# Patient Record
Sex: Male | Born: 1975
Health system: Southern US, Community
[De-identification: ages and names within clinical notes are randomized; demographics above are authoritative.]

## PROBLEM LIST (undated history)

## (undated) DIAGNOSIS — R519 Headache, unspecified: Secondary | ICD-10-CM

## (undated) DIAGNOSIS — R51 Headache: Secondary | ICD-10-CM

## (undated) DIAGNOSIS — C20 Malignant neoplasm of rectum: Secondary | ICD-10-CM

## (undated) HISTORY — PX: PORTA CATH INSERTION: CATH118285

## (undated) HISTORY — PX: WISDOM TOOTH EXTRACTION: SHX21

## (undated) HISTORY — PX: COLONOSCOPY: SHX174

---

## 2016-04-18 DIAGNOSIS — C20 Malignant neoplasm of rectum: Secondary | ICD-10-CM

## 2016-04-18 HISTORY — DX: Malignant neoplasm of rectum: C20

## 2016-11-16 ENCOUNTER — Emergency Department (HOSPITAL_COMMUNITY): Payer: BLUE CROSS/BLUE SHIELD

## 2016-11-16 ENCOUNTER — Emergency Department (HOSPITAL_COMMUNITY)
Admission: EM | Admit: 2016-11-16 | Discharge: 2016-11-16 | Disposition: A | Discharge status: Home / Self Care | Payer: BLUE CROSS/BLUE SHIELD | Attending: Emergency Medicine | Admitting: Emergency Medicine

## 2016-11-16 ENCOUNTER — Encounter (HOSPITAL_COMMUNITY): Payer: Self-pay | Admitting: Emergency Medicine

## 2016-11-16 DIAGNOSIS — Z79899 Other long term (current) drug therapy: Secondary | ICD-10-CM | POA: Insufficient documentation

## 2016-11-16 DIAGNOSIS — R1030 Lower abdominal pain, unspecified: Secondary | ICD-10-CM

## 2016-11-16 DIAGNOSIS — Z87891 Personal history of nicotine dependence: Secondary | ICD-10-CM | POA: Insufficient documentation

## 2016-11-16 DIAGNOSIS — K5902 Outlet dysfunction constipation: Secondary | ICD-10-CM | POA: Insufficient documentation

## 2016-11-16 LAB — CBC WITH DIFFERENTIAL/PLATELET
BASOS PCT: 1 %
Basophils Absolute: 0.1 10*3/uL (ref 0.0–0.1)
EOS ABS: 0.4 10*3/uL (ref 0.0–0.7)
EOS PCT: 5 %
HCT: 37 % — ABNORMAL LOW (ref 39.0–52.0)
Hemoglobin: 12.6 g/dL — ABNORMAL LOW (ref 13.0–17.0)
LYMPHS ABS: 2.6 10*3/uL (ref 0.7–4.0)
Lymphocytes Relative: 29 %
MCH: 30.1 pg (ref 26.0–34.0)
MCHC: 34.1 g/dL (ref 30.0–36.0)
MCV: 88.3 fL (ref 78.0–100.0)
Monocytes Absolute: 0.6 10*3/uL (ref 0.1–1.0)
Monocytes Relative: 7 %
Neutro Abs: 5.4 10*3/uL (ref 1.7–7.7)
Neutrophils Relative %: 58 %
PLATELETS: 268 10*3/uL (ref 150–400)
RBC: 4.19 MIL/uL — AB (ref 4.22–5.81)
RDW: 13.8 % (ref 11.5–15.5)
WBC: 9.1 10*3/uL (ref 4.0–10.5)

## 2016-11-16 LAB — COMPREHENSIVE METABOLIC PANEL
ALT: 12 U/L — AB (ref 17–63)
ANION GAP: 6 (ref 5–15)
AST: 17 U/L (ref 15–41)
Albumin: 3.7 g/dL (ref 3.5–5.0)
Alkaline Phosphatase: 57 U/L (ref 38–126)
BUN: 11 mg/dL (ref 6–20)
CHLORIDE: 106 mmol/L (ref 101–111)
CO2: 29 mmol/L (ref 22–32)
Calcium: 8.7 mg/dL — ABNORMAL LOW (ref 8.9–10.3)
Creatinine, Ser: 0.82 mg/dL (ref 0.61–1.24)
GFR calc non Af Amer: >60 mL/min (ref >60)
Glucose, Bld: 96 mg/dL (ref 65–99)
Potassium: 3.9 mmol/L (ref 3.5–5.1)
SODIUM: 141 mmol/L (ref 135–145)
Total Bilirubin: 0.4 mg/dL (ref 0.3–1.2)
Total Protein: 6.3 g/dL — ABNORMAL LOW (ref 6.5–8.1)

## 2016-11-16 LAB — URINALYSIS, ROUTINE W REFLEX MICROSCOPIC
Bilirubin Urine: NEGATIVE
Glucose, UA: NEGATIVE mg/dL
Hgb urine dipstick: NEGATIVE
Ketones, ur: NEGATIVE mg/dL
Leukocytes, UA: NEGATIVE
NITRITE: NEGATIVE
Protein, ur: NEGATIVE mg/dL
Specific Gravity, Urine: 1.02 (ref 1.005–1.030)
pH: 5 (ref 5.0–8.0)

## 2016-11-16 LAB — LIPASE, BLOOD: Lipase: 25 U/L (ref 11–51)

## 2016-11-16 MED ORDER — IOPAMIDOL (ISOVUE-300) INJECTION 61%
INTRAVENOUS | Status: AC
  Filled 2016-11-16: qty 100

## 2016-11-16 MED ORDER — MORPHINE SULFATE (PF) 2 MG/ML IV SOLN
4.0000 mg | Freq: Once | INTRAVENOUS | Status: AC
  Administered 2016-11-16: 4 mg via INTRAVENOUS
  Filled 2016-11-16: qty 2

## 2016-11-16 MED ORDER — IOPAMIDOL (ISOVUE-300) INJECTION 61%
100.0000 mL | Freq: Once | INTRAVENOUS | Status: AC | PRN
  Administered 2016-11-16: 100 mL via INTRAVENOUS

## 2016-11-16 MED ORDER — ONDANSETRON HCL 4 MG/2ML IJ SOLN
4.0000 mg | Freq: Once | INTRAMUSCULAR | Status: AC
  Administered 2016-11-16: 4 mg via INTRAVENOUS
  Filled 2016-11-16: qty 2

## 2016-11-16 MED ORDER — SODIUM CHLORIDE 0.9 % IV BOLUS (SEPSIS)
1000.0000 mL | Freq: Once | INTRAVENOUS | Status: AC
  Administered 2016-11-16: 1000 mL via INTRAVENOUS

## 2016-11-16 NOTE — Discharge Instructions (Signed)
TAKE 8 CAPFULS OF MIRALAX IN A 32 OUNCE GATORADE AND DRINK THE WHOLE BEVERAGE.  Call for appointments for GI, Oncology, and a PCP

## 2016-11-16 NOTE — ED Provider Notes (Signed)
[facility] DEPT Provider Note   CSN: [id number] Arrival date & time: 11/16/16  0520     History   Chief Complaint Chief Complaint  Patient presents with  . Abdominal Pain    HPI Josie Mesa. is a 41 y.o. male.  41 yo M with a cc of crampy lower abdominal pain.  Going on for past week or so.  Denies fevers, vomiting.  Hx of colon CA.  Diagnosed a couple weeks ago.  Was in [place], one round of chemo. Had to move here because of an emergency.  Now looking for oncology and GI.     The history is provided by the patient.  Abdominal Pain   This is a new problem. The current episode started more than 2 days ago. The problem occurs constantly. The problem has been gradually worsening. The pain is associated with eating. The pain is located in the RLQ, LLQ and suprapubic region. The quality of the pain is aching and cramping. The pain is at a severity of 7/10. The pain is severe. Associated symptoms include constipation. Pertinent negatives include fever, diarrhea, vomiting, headaches, arthralgias and myalgias. Nothing aggravates the symptoms. Nothing relieves the symptoms.    Past Medical History:  Diagnosis Date  . Colorectal cancer ([place]) dx'd 09/13/16    There are no active problems to display for this patient.   Past Surgical History:  Procedure Laterality Date  . COLONOSCOPY         Home Medications    Prior to Admission medications   Medication Sig Start Date End Date Taking? Authorizing Provider  acetaminophen (TYLENOL) 500 MG tablet Take 1,000 mg by mouth every 6 (six) hours as needed for moderate pain.   Yes [provider]  acidophilus (RISAQUAD) CAPS capsule Take 1 capsule by mouth daily.   Yes [provider]  Ascorbic Acid (VITAMIN C) 1000 MG tablet Take 1,000 mg by mouth daily.   Yes [provider]  docusate sodium (COLACE) 100 MG capsule Take 100 mg by mouth daily as needed for mild constipation.   Yes [provider]    GARLIC PO Take 1 tablet by mouth daily.   Yes [provider]  Pramox-PE-Glycerin-Petrolatum (PREPARATION H) 1-0.25-14.4-15 % CREA Place 1 application rectally 4 (four) times daily as needed. Hemorrhoids   Yes [provider]  TURMERIC PO Take 1,000 tablets by mouth daily.   Yes [provider]    Family History Family History  Problem Relation Age of Onset  . Cancer Other   . Diabetes Other     Social History Social History  Substance Use Topics  . Smoking status: Former Research scientist (life sciences)  . Smokeless tobacco: Never Used  . Alcohol use No     Allergies   Patient has no known allergies.   Review of Systems Review of Systems  Constitutional: Negative for chills and fever.  HENT: Negative for congestion and facial swelling.   Eyes: Negative for discharge and visual disturbance.  Respiratory: Negative for shortness of breath.   Cardiovascular: Negative for chest pain and palpitations.  Gastrointestinal: Positive for abdominal pain and constipation. Negative for diarrhea and vomiting.  Musculoskeletal: Negative for arthralgias and myalgias.  Skin: Negative for color change and rash.  Neurological: Negative for tremors, syncope and headaches.  Psychiatric/Behavioral: Negative for confusion and dysphoric mood.     Physical Exam Updated Vital Signs BP 94/66 (BP Location: Left Arm)   Pulse (!) 51   Temp 97.8 F (36.6 C) (Oral)  Resp 14   Ht 6' 2.5" (1.892 m)   Wt 68.5 kg (151 lb)   SpO2 100%   BMI 19.13 kg/m   Physical Exam  Constitutional: He is oriented to person, place, and time. He appears well-developed and well-nourished.  HENT:  Head: Normocephalic and atraumatic.  Eyes: Pupils are equal, round, and reactive to light. EOM are normal.  Neck: Normal range of motion. Neck supple. No JVD present.  Cardiovascular: Normal rate and regular rhythm.  Exam reveals no gallop and no friction rub.   No murmur heard. Pulmonary/Chest: No respiratory  distress. He has no wheezes.  Abdominal: He exhibits no distension and no mass. There is tenderness (TTP mild about the lower abdomen). There is no rebound and no guarding. No hernia. Hernia confirmed negative in the right inguinal area and confirmed negative in the left inguinal area.  Genitourinary: Right testis shows no mass, no swelling and no tenderness. Left testis shows no mass, no swelling and no tenderness. Circumcised.  Musculoskeletal: Normal range of motion.  Neurological: He is alert and oriented to person, place, and time.  Skin: No rash noted. No pallor.  Psychiatric: He has a normal mood and affect. His behavior is normal.  Nursing note and vitals reviewed.    ED Treatments / Results  Labs (all labs ordered are listed, but only abnormal results are displayed) Labs Reviewed  CBC WITH DIFFERENTIAL/PLATELET - Abnormal; Notable for the following:       Result Value   RBC 4.19 (*)    Hemoglobin 12.6 (*)    HCT 37.0 (*)    All other components within normal limits  COMPREHENSIVE METABOLIC PANEL - Abnormal; Notable for the following:    Calcium 8.7 (*)    Total Protein 6.3 (*)    ALT 12 (*)    All other components within normal limits  LIPASE, BLOOD  URINALYSIS, ROUTINE W REFLEX MICROSCOPIC    EKG  EKG Interpretation None       Radiology Ct Abdomen Pelvis W Contrast  Result Date: 11/16/2016 CLINICAL DATA:  Lower abdominal pain.  History of colon cancer. EXAM: CT ABDOMEN AND PELVIS WITH CONTRAST TECHNIQUE: Multidetector CT imaging of the abdomen and pelvis was performed using the standard protocol following bolus administration of intravenous contrast. CONTRAST:  124mL ISOVUE-300 IOPAMIDOL (ISOVUE-300) INJECTION 61% COMPARISON:  None. FINDINGS: Lower chest: No acute abnormality. Hepatobiliary: Gallstones are noted. Peripherally enhancing 14 mm abnormality is noted in left hepatic lobe. Smaller low density abnormality is seen in dome of right hepatic lobe. These may  represent hemangiomas, but metastatic disease cannot be excluded given the history of colon cancer. Pancreas: Unremarkable. No pancreatic ductal dilatation or surrounding inflammatory changes. Spleen: Normal in size without focal abnormality. Adrenals/Urinary Tract: Adrenal glands are unremarkable. Kidneys are normal, without renal calculi, focal lesion, or hydronephrosis. Bladder is unremarkable. Stomach/Bowel: The stomach appears normal. The appendix is not clearly identified. No small bowel dilatation is noted. Air-filled colon is noted. Apple-core lesion is seen involving the rectosigmoid junction, with adjacent soft tissue mass measuring 4.9 x 3.5 x 3.3 cm consistent with malignancy. Vascular/Lymphatic: No significant vascular findings are present. No enlarged abdominal or pelvic lymph nodes. Reproductive: Prostate is unremarkable. Other: No abdominal wall hernia or abnormality. No abdominopelvic ascites. Musculoskeletal: No acute or significant osseous findings. IMPRESSION: Apple-core lesion with adjacent soft tissue mass is noted involving the rectosigmoid junction consistent with a history of colonic malignancy. Small peripherally enhancing low densities are noted in the liver which may  simply represent hemangiomas, but given the history of colonic malignancy, MRI of the liver is recommended to evaluate for possible metastatic disease. Electronically Signed   By: Marijo Conception, M.D.   On: 11/16/2016 09:33    Procedures Procedures (including critical care time)  Medications Ordered in ED Medications  morphine 2 MG/ML injection 4 mg (4 mg Intravenous Given 11/16/16 0802)  ondansetron (ZOFRAN) injection 4 mg (4 mg Intravenous Given 11/16/16 0802)  sodium chloride 0.9 % bolus 1,000 mL (0 mLs Intravenous Stopped 11/16/16 0901)  iopamidol (ISOVUE-300) 61 % injection 100 mL (100 mLs Intravenous Contrast Given 11/16/16 0904)     Initial Impression / Assessment and Plan / ED Course  I have reviewed the  triage vital signs and the nursing notes.  Pertinent labs & imaging results that were available during my care of the patient were reviewed by me and considered in my medical decision making (see chart for details).     41 yo M with a cc of lower abdominal pain.  Going on for past week.  Worsening constipation.  He feels that his mass has enlarged. Will obtain a CT.   No noted obstruction, will treat for constipation. Given follow up with GI, PCP, oncology.   9:45 AM:  I have discussed the diagnosis/risks/treatment options with the patient and believe the pt to be eligible for discharge home to follow-up with PCP, oncology, GI. We also discussed returning to the ED immediately if new or worsening sx occur. We discussed the sx which are most concerning (e.g., sudden worsening pain, fever, inability to tolerate by mouth) that necessitate immediate return. Medications administered to the patient during their visit and any new prescriptions provided to the patient are listed below.  Medications given during this visit Medications  morphine 2 MG/ML injection 4 mg (4 mg Intravenous Given 11/16/16 0802)  ondansetron (ZOFRAN) injection 4 mg (4 mg Intravenous Given 11/16/16 0802)  sodium chloride 0.9 % bolus 1,000 mL (0 mLs Intravenous Stopped 11/16/16 0901)  iopamidol (ISOVUE-300) 61 % injection 100 mL (100 mLs Intravenous Contrast Given 11/16/16 0904)     The patient appears reasonably screen and/or stabilized for discharge and I doubt any other medical condition or other [facility] requiring further screening, evaluation, or treatment in the ED at this time prior to discharge.    Final Clinical Impressions(s) / ED Diagnoses   Final diagnoses:  Constipation due to outlet dysfunction  Lower abdominal pain    New Prescriptions New Prescriptions   No medications on file     Deno Etienne, DO 11/16/16 0945

## 2016-11-16 NOTE — ED Triage Notes (Signed)
Pt states he is from [place] and had to emergently move here so he is in the process of trying to get established with an oncologist  Pt states he was diagnosed with colorectal cancer and was undergoing chemo in [place] but has not had any in the past 2 weeks  Pt states he is having lower abd pain that he is not able to control at home with hot baths and weed  Pt has his records with him at bedside   Pt states he has been having rectal bleeding as well

## 2016-12-07 DIAGNOSIS — G893 Neoplasm related pain (acute) (chronic): Secondary | ICD-10-CM | POA: Insufficient documentation

## 2016-12-07 DIAGNOSIS — C20 Malignant neoplasm of rectum: Secondary | ICD-10-CM | POA: Insufficient documentation

## 2017-03-04 ENCOUNTER — Other Ambulatory Visit: Payer: Self-pay

## 2017-03-04 ENCOUNTER — Encounter (HOSPITAL_BASED_OUTPATIENT_CLINIC_OR_DEPARTMENT_OTHER): Payer: Self-pay | Admitting: Emergency Medicine

## 2017-03-04 ENCOUNTER — Emergency Department (HOSPITAL_BASED_OUTPATIENT_CLINIC_OR_DEPARTMENT_OTHER)
Admission: EM | Admit: 2017-03-04 | Discharge: 2017-03-04 | Disposition: A | Discharge status: Home / Self Care | Payer: BLUE CROSS/BLUE SHIELD | Attending: Emergency Medicine | Admitting: Emergency Medicine

## 2017-03-04 ENCOUNTER — Emergency Department (HOSPITAL_BASED_OUTPATIENT_CLINIC_OR_DEPARTMENT_OTHER): Payer: BLUE CROSS/BLUE SHIELD

## 2017-03-04 DIAGNOSIS — Z79899 Other long term (current) drug therapy: Secondary | ICD-10-CM | POA: Diagnosis not present

## 2017-03-04 DIAGNOSIS — Z85038 Personal history of other malignant neoplasm of large intestine: Secondary | ICD-10-CM | POA: Diagnosis not present

## 2017-03-04 DIAGNOSIS — C189 Malignant neoplasm of colon, unspecified: Secondary | ICD-10-CM

## 2017-03-04 DIAGNOSIS — K5900 Constipation, unspecified: Secondary | ICD-10-CM | POA: Insufficient documentation

## 2017-03-04 DIAGNOSIS — F17210 Nicotine dependence, cigarettes, uncomplicated: Secondary | ICD-10-CM | POA: Diagnosis not present

## 2017-03-04 DIAGNOSIS — R195 Other fecal abnormalities: Secondary | ICD-10-CM | POA: Diagnosis not present

## 2017-03-04 LAB — COMPREHENSIVE METABOLIC PANEL
ALBUMIN: 3.9 g/dL (ref 3.5–5.0)
ALK PHOS: 64 U/L (ref 38–126)
ALT: 18 U/L (ref 17–63)
AST: 22 U/L (ref 15–41)
Anion gap: 6 (ref 5–15)
BILIRUBIN TOTAL: 0.5 mg/dL (ref 0.3–1.2)
BUN: 7 mg/dL (ref 6–20)
CALCIUM: 8.7 mg/dL — AB (ref 8.9–10.3)
CO2: 28 mmol/L (ref 22–32)
Chloride: 105 mmol/L (ref 101–111)
Creatinine, Ser: 0.56 mg/dL — ABNORMAL LOW (ref 0.61–1.24)
GFR calc Af Amer: >60 mL/min (ref >60)
GFR calc non Af Amer: >60 mL/min (ref >60)
GLUCOSE: 109 mg/dL — AB (ref 65–99)
Potassium: 3.9 mmol/L (ref 3.5–5.1)
SODIUM: 139 mmol/L (ref 135–145)
TOTAL PROTEIN: 6.6 g/dL (ref 6.5–8.1)

## 2017-03-04 LAB — CBC WITH DIFFERENTIAL/PLATELET
BASOS ABS: 0.1 10*3/uL (ref 0.0–0.1)
BASOS PCT: 1 %
Eosinophils Absolute: 0.6 10*3/uL (ref 0.0–0.7)
Eosinophils Relative: 7 %
HEMATOCRIT: 37.8 % — AB (ref 39.0–52.0)
HEMOGLOBIN: 12.7 g/dL — AB (ref 13.0–17.0)
Lymphocytes Relative: 28 %
Lymphs Abs: 2.5 10*3/uL (ref 0.7–4.0)
MCH: 30.4 pg (ref 26.0–34.0)
MCHC: 33.6 g/dL (ref 30.0–36.0)
MCV: 90.4 fL (ref 78.0–100.0)
Monocytes Absolute: 0.9 10*3/uL (ref 0.1–1.0)
Monocytes Relative: 11 %
NEUTROS ABS: 4.7 10*3/uL (ref 1.7–7.7)
NEUTROS PCT: 53 %
Platelets: 246 10*3/uL (ref 150–400)
RBC: 4.18 MIL/uL — AB (ref 4.22–5.81)
RDW: 14.1 % (ref 11.5–15.5)
WBC: 8.9 10*3/uL (ref 4.0–10.5)

## 2017-03-04 LAB — OCCULT BLOOD X 1 CARD TO LAB, STOOL: Fecal Occult Bld: POSITIVE — AB

## 2017-03-04 MED ORDER — TRAMADOL HCL 50 MG PO TABS: 100.0000 mg | ORAL_TABLET | Freq: Four times a day (QID) | ORAL | 0 refills | Status: DC | PRN

## 2017-03-04 MED ORDER — PEG 3350-KCL-NABCB-NACL-NASULF 236 G PO SOLR: ORAL | 2 refills | Status: DC

## 2017-03-04 NOTE — ED Notes (Signed)
Patient stated that he was diagnosed with colon cancer on Sep 13, 2016.  He has been constipated and the only med that will help him is the Golytely.  Patient stated that his MD gave him 110 tabs of Percocet and his 39 year old stepdaughter throw the med away by mistake.

## 2017-03-04 NOTE — ED Triage Notes (Signed)
Pt with colorectal CA and c/o constipation x 3 wks; was receiving tx in [facility], but recently moved here and is in the process of establishing care

## 2017-03-04 NOTE — ED Provider Notes (Signed)
[facility] EMERGENCY DEPARTMENT Provider Note   CSN: [id number] Arrival date & time: 03/04/17  1745     History   Chief Complaint Chief Complaint  Patient presents with  . Constipation    HPI Jacob Galvan. is a 41 y.o. male.  HPI Patient states he has colorectal cancer.  He has undergone chemotherapy but never had surgical interventions.  He reports that this was treated in [place] but he has recently moving to this area.  He has not establish care.  Patient reports he suffers from problems with severe constipation.  He reports he has had constipation for 3 weeks now.  He gets intense cramping pain and urgency for bowel movement but unable to go.  He reports the only thing that actually works for him is GoLYTELY.  He takes MiraLAX regularly but cannot maintain bowel movements.  No fever no vomiting.  Reports he is not getting any narcotic pain medications.  He reports he has not had any in months.  He reports that at one point in time his medications got erroneously thrown away and he could no longer get refills due to being in a pain contract. Past Medical History:  Diagnosis Date  . Colorectal cancer ([place]) dx'd 09/13/16    There are no active problems to display for this patient.   Past Surgical History:  Procedure Laterality Date  . COLONOSCOPY    . PORTA CATH INSERTION         Home Medications    Prior to Admission medications   Medication Sig Start Date End Date Taking? Authorizing Provider  acetaminophen (TYLENOL) 500 MG tablet Take 1,000 mg by mouth every 6 (six) hours as needed for moderate pain.    [provider]  acidophilus (RISAQUAD) CAPS capsule Take 1 capsule by mouth daily.    [provider]  Ascorbic Acid (VITAMIN C) 1000 MG tablet Take 1,000 mg by mouth daily.    [provider]  docusate sodium (COLACE) 100 MG capsule Take 100 mg by mouth daily as needed for mild constipation.    [provider]  GARLIC  PO Take 1 tablet by mouth daily.    [provider]  polyethylene glycol (GOLYTELY) 236 g solution 2 L by mouth over 4 hours 03/04/17   Charlesetta Shanks, MD  Pramox-PE-Glycerin-Petrolatum (PREPARATION H) 1-0.25-14.4-15 % CREA Place 1 application rectally 4 (four) times daily as needed. Hemorrhoids    [provider]  traMADol (ULTRAM) 50 MG tablet Take 2 tablets (100 mg total) every 6 (six) hours as needed by mouth. 03/04/17   Charlesetta Shanks, MD  TURMERIC PO Take 1,000 tablets by mouth daily.    [provider]    Family History Family History  Problem Relation Age of Onset  . Cancer Other   . Diabetes Other     Social History Social History   Tobacco Use  . Smoking status: Current Some Day Smoker  . Smokeless tobacco: Never Used  Substance Use Topics  . Alcohol use: Yes    Comment: occ  . Drug use: Yes    Types: Marijuana    Comment: daily     Allergies   Patient has no known allergies.   Review of Systems Review of Systems 10 Systems reviewed and are negative for acute change except as noted in the HPI.  Physical Exam Updated Vital Signs BP 109/79 (BP Location: Right Arm)   Pulse 84   Temp 98.1 F (36.7 C) (Oral)  Resp 18   Ht 6\' 2"  (1.88 m)   Wt 63.5 kg (140 lb)   SpO2 100%   BMI 17.97 kg/m   Physical Exam  Constitutional: He is oriented to person, place, and time. He appears well-developed and well-nourished.  Patient is alert and nontoxic.  Mental status clear.  No respiratory distress.  HENT:  Head: Normocephalic and atraumatic.  Eyes: Conjunctivae and EOM are normal.  Neck: Neck supple.  Cardiovascular: Normal rate and regular rhythm.  No murmur heard. Pulmonary/Chest: Effort normal and breath sounds normal. No respiratory distress.  Abdominal: Soft. He exhibits no distension. There is no tenderness.  Genitourinary:  Genitourinary Comments: No stool in rectal vault.  Musculoskeletal: Normal range of motion. He exhibits  no edema or tenderness.  Neurological: He is alert and oriented to person, place, and time. No cranial nerve deficit. He exhibits normal muscle tone. Coordination normal.  Skin: Skin is warm and dry.  Psychiatric: He has a normal mood and affect.  Nursing note and vitals reviewed.    ED Treatments / Results  Labs (all labs ordered are listed, but only abnormal results are displayed) Labs Reviewed  CBC WITH DIFFERENTIAL/PLATELET - Abnormal; Notable for the following components:      Result Value   RBC 4.18 (*)    Hemoglobin 12.7 (*)    HCT 37.8 (*)    All other components within normal limits  COMPREHENSIVE METABOLIC PANEL - Abnormal; Notable for the following components:   Glucose, Bld 109 (*)    Creatinine, Ser 0.56 (*)    Calcium 8.7 (*)    All other components within normal limits  OCCULT BLOOD X 1 CARD TO LAB, STOOL - Abnormal; Notable for the following components:   Fecal Occult Bld POSITIVE (*)    All other components within normal limits    EKG  EKG Interpretation None       Radiology Dg Abd Acute W/chest  Result Date: 03/04/2017 CLINICAL DATA:  Colorectal cancer diagnosed April 2018. Last chemotherapy treatment October 2018. Constant pain. Feels constipated. EXAM: DG ABDOMEN ACUTE W/ 1V CHEST COMPARISON:  CT abdomen and pelvis from 11/16/2016. FINDINGS: Normal heart size and mediastinal contours. Port catheter tip is seen at the cavoatrial junction. Lungs are clear. There is no free air beneath the diaphragm. Increased colonic stool burden is seen throughout the colon. No small bowel dilatation. No organomegaly or suspicious osseous lesions. IMPRESSION: Increased colonic stool burden consistent with constipation. Electronically Signed   By: Ashley Royalty M.D.   On: 03/04/2017 22:15    Procedures Procedures (including critical care time)  Medications Ordered in ED Medications - No data to display   Initial Impression / Assessment and Plan / ED Course  I have  reviewed the triage vital signs and the nursing notes.  Pertinent labs & imaging results that were available during my care of the patient were reviewed by me and considered in my medical decision making (see chart for details).      Final Clinical Impressions(s) / ED Diagnoses   Final diagnoses:  Constipation, unspecified constipation type  Malignant neoplasm of colon, unspecified part of colon [facility])  Patient presents as outlined above.  He reports history of colon cancer.  Patient clinically is stable.  He does have a copy of his CT report from the end of October.  Acute abdominal series shows no evidence of obstruction but diffuse constipation.  Digital exam does not show impaction.  Patient reports that the only thing that  really works for him is GoLYTELY.  This will be prescribed as per the patient's request.  She reports that he has not had narcotic pain medications in many months.  Will be given tramadol to use sparingly for abdominal pain.  Follow-up providers are given and patient reports he will be establishing care locally.  ED Discharge Orders        Ordered    traMADol (ULTRAM) 50 MG tablet  Every 6 hours PRN     03/04/17 2306    polyethylene glycol (GOLYTELY) 236 g solution     03/04/17 2306       Charlesetta Shanks, MD 03/04/17 2315

## 2017-03-06 ENCOUNTER — Telehealth: Payer: Self-pay | Admitting: Oncology

## 2017-03-06 DIAGNOSIS — C189 Malignant neoplasm of colon, unspecified: Secondary | ICD-10-CM

## 2017-03-06 NOTE — Progress Notes (Signed)
  Oncology Nurse Navigator Documentation  Navigator Location: [facility] (03/06/17 1154)   )Navigator Encounter Type: Telephone (03/06/17 1154) Telephone: Jacob Galvan Call (03/06/17 1154)  I called patient and discussed transition of his Oncology Care from [facility] to [facility]. Patient states that he is in the middle of his "cancer treatment and chemo" and had to move to [place] because of a family emergency. I called and left a message for patient's oncologist, Dr. Ethlyn Daniels, requesting a call back.                   Treatment Phase: Pre-Tx/Tx Discussion (03/06/17 1154)                  Acuity: Level 3 (03/06/17 1154)     Acuity Level 3: Ongoing guidance and education provided throughout treatment;Other (03/06/17 1154)   Time Spent with Patient: 60 (03/06/17 1154)

## 2017-03-06 NOTE — Telephone Encounter (Signed)
Appt has been scheduled for the pt to see Dr. Benay Spice on 11/20 at 10am. Pt aware to arrive 30 minutes early.

## 2017-03-06 NOTE — Progress Notes (Signed)
  Oncology Nurse Navigator Documentation  Navigator Location: [facility] (03/06/17 2952) Referral date to RadOnc/MedOnc: 03/06/17 (03/06/17 8413) )Navigator Encounter Type: Introductory phone call (03/06/17 0934)                         Barriers/Navigation Needs: Coordination of Care (03/06/17 2440)   Interventions: Coordination of Care;Referrals (03/06/17 0934)   Coordination of Care: Appts (03/06/17 1027)   I received a message from Dr. Jana Hakim regarding this patient. Dr. Jana Hakim saw this patient in the ED over the weekend with diagnosis of constipation. Dr, Jana Hakim shared that patient recently moved to the area and had been treated for colon cancer in the past and would like to establish care at [facility]. I called and spoke with Jacob Galvan and he would like to be scheduled for a Med/Onc appointment. Referral placed.      Acuity: Level 1 (03/06/17 0934)         Time Spent with Patient: 15 (03/06/17 0934)

## 2017-03-06 NOTE — Progress Notes (Signed)
  Oncology Nurse Navigator Documentation  I spoke with Dr. Ilsa Iha Abushahin's nurse at [facility]. She will fax latest MD notes. I was able to query patient information on Care Everywhere. Patient to be seen on 03/07/17 by Dr. Benay Spice. I called patient and provided him with directions to [facility]. Patient appreciative.      )

## 2017-03-07 ENCOUNTER — Telehealth: Payer: Self-pay | Admitting: *Deleted

## 2017-03-07 ENCOUNTER — Ambulatory Visit: Payer: BLUE CROSS/BLUE SHIELD | Admitting: Oncology

## 2017-03-07 NOTE — Telephone Encounter (Signed)
Called pt re: missed appt today. He reports he also had an app[facility] and didn't get done in time. Pt requested to reschedule. Left message on voicemail offering 11/29 @ 4PM or 11/30 @ 1330 to see Dr. Benay Spice. Requested pt call back to schedule.

## 2017-03-07 NOTE — Progress Notes (Signed)
  Oncology Nurse Navigator Documentation  Navigator Location: [facility] (03/07/17 1604)   )Navigator Encounter Type: Telephone (03/07/17 1604) Telephone: Incoming Call (03/07/17 1604)                               Coordination of Care: Appts (03/07/17 9604)     Patient called back to confirm appointment on 03/17/17 @ 1330.   Acuity: Level 3 (03/07/17 1604)     Acuity Level 3: Ongoing guidance and education provided throughout treatment (03/07/17 1604)   Time Spent with Patient: 15 (03/07/17 1604)

## 2017-03-12 ENCOUNTER — Other Ambulatory Visit: Payer: Self-pay

## 2017-03-12 ENCOUNTER — Encounter (HOSPITAL_COMMUNITY): Payer: Self-pay | Admitting: Emergency Medicine

## 2017-03-12 ENCOUNTER — Emergency Department (HOSPITAL_COMMUNITY): Payer: BLUE CROSS/BLUE SHIELD

## 2017-03-12 ENCOUNTER — Emergency Department (HOSPITAL_COMMUNITY)
Admission: EM | Admit: 2017-03-12 | Discharge: 2017-03-12 | Disposition: A | Discharge status: Home / Self Care | Payer: BLUE CROSS/BLUE SHIELD | Attending: Emergency Medicine | Admitting: Emergency Medicine

## 2017-03-12 DIAGNOSIS — K5900 Constipation, unspecified: Secondary | ICD-10-CM | POA: Diagnosis not present

## 2017-03-12 DIAGNOSIS — R109 Unspecified abdominal pain: Secondary | ICD-10-CM

## 2017-03-12 DIAGNOSIS — C189 Malignant neoplasm of colon, unspecified: Secondary | ICD-10-CM | POA: Diagnosis not present

## 2017-03-12 DIAGNOSIS — R1030 Lower abdominal pain, unspecified: Secondary | ICD-10-CM | POA: Diagnosis present

## 2017-03-12 DIAGNOSIS — Z79899 Other long term (current) drug therapy: Secondary | ICD-10-CM | POA: Diagnosis not present

## 2017-03-12 DIAGNOSIS — F17200 Nicotine dependence, unspecified, uncomplicated: Secondary | ICD-10-CM | POA: Diagnosis not present

## 2017-03-12 LAB — COMPREHENSIVE METABOLIC PANEL
ALT: 17 U/L (ref 17–63)
ANION GAP: 7 (ref 5–15)
AST: 27 U/L (ref 15–41)
Albumin: 3.8 g/dL (ref 3.5–5.0)
Alkaline Phosphatase: 64 U/L (ref 38–126)
BILIRUBIN TOTAL: 0.5 mg/dL (ref 0.3–1.2)
BUN: 9 mg/dL (ref 6–20)
CO2: 27 mmol/L (ref 22–32)
Calcium: 8.6 mg/dL — ABNORMAL LOW (ref 8.9–10.3)
Chloride: 102 mmol/L (ref 101–111)
Creatinine, Ser: 0.87 mg/dL (ref 0.61–1.24)
GFR calc non Af Amer: >60 mL/min (ref >60)
GLUCOSE: 115 mg/dL — AB (ref 65–99)
POTASSIUM: 3.8 mmol/L (ref 3.5–5.1)
SODIUM: 136 mmol/L (ref 135–145)
TOTAL PROTEIN: 6.6 g/dL (ref 6.5–8.1)

## 2017-03-12 LAB — CBC
HEMATOCRIT: 36.9 % — AB (ref 39.0–52.0)
Hemoglobin: 12.6 g/dL — ABNORMAL LOW (ref 13.0–17.0)
MCH: 31.2 pg (ref 26.0–34.0)
MCHC: 34.1 g/dL (ref 30.0–36.0)
MCV: 91.3 fL (ref 78.0–100.0)
Platelets: 274 10*3/uL (ref 150–400)
RBC: 4.04 MIL/uL — ABNORMAL LOW (ref 4.22–5.81)
RDW: 14.5 % (ref 11.5–15.5)
WBC: 9.9 10*3/uL (ref 4.0–10.5)

## 2017-03-12 MED ORDER — KETOROLAC TROMETHAMINE 60 MG/2ML IM SOLN
60.0000 mg | Freq: Once | INTRAMUSCULAR | Status: AC
  Administered 2017-03-12: 60 mg via INTRAMUSCULAR
  Filled 2017-03-12: qty 2

## 2017-03-12 MED ORDER — PEG 3350-KCL-NA BICARB-NACL 420 G PO SOLR
4000.0000 mL | Freq: Once | ORAL | Status: AC
Start: 2017-03-12 — End: 2017-03-12
  Administered 2017-03-12: 4000 mL via ORAL
  Filled 2017-03-12: qty 4000

## 2017-03-12 MED ORDER — DICYCLOMINE HCL 10 MG PO CAPS
10.0000 mg | ORAL_CAPSULE | Freq: Once | ORAL | Status: AC
  Administered 2017-03-12: 10 mg via ORAL
  Filled 2017-03-12: qty 1

## 2017-03-12 NOTE — ED Notes (Signed)
Bed: WA09 Expected date:  Expected time:  Means of arrival:  Comments: EMS 41 yo male states he has cancer and is in between physicians and not getting treatment-complaining of pain

## 2017-03-12 NOTE — ED Triage Notes (Addendum)
Pt brought in by EMS from home with c/o generalized pain, states, "Pain all over".  Has colon cancer.

## 2017-03-12 NOTE — ED Provider Notes (Signed)
[facility] DEPT Provider Note   CSN: [id number] Arrival date & time: 03/12/17  0345     History   Chief Complaint Chief Complaint  Patient presents with  . Generalized Pain    HPI Jacob Galvan. is a 41 y.o. male.  HPI 41 year old male who has active colon cancer.  He is recently relocated to the [place] region and is setting up oncology follow-up with the [facility].  He presents the emergency department with generalized lower abdominal pain radiating down into his testicles.  He states he has this pain all the time that he was previously managed with hydrocodone and oxycodone but is no longer receiving those medications from his team in [place].  He reports severe constipation.  He states the only thing that helps his GoLYTELY.  He is tried [place] but has not had success with this.  He feels like he has not had a solid bowel movement in the past week.  Denies fevers and chills.  Denies nausea vomiting.  Denies low back pain.  No dysuria or urinary frequency.  Pain is moderate in severity.   Past Medical History:  Diagnosis Date  . Colorectal cancer ([facility]) dx'd 09/13/16    There are no active problems to display for this patient.   Past Surgical History:  Procedure Laterality Date  . COLONOSCOPY    . PORTA CATH INSERTION         Home Medications    Prior to Admission medications   Medication Sig Start Date End Date Taking? Authorizing Provider  acetaminophen (TYLENOL) 500 MG tablet Take 1,000 mg by mouth every 6 (six) hours as needed for moderate pain.    [provider]  acidophilus (RISAQUAD) CAPS capsule Take 1 capsule by mouth daily.    [provider]  Ascorbic Acid (VITAMIN C) 1000 MG tablet Take 1,000 mg by mouth daily.    [provider]  docusate sodium (COLACE) 100 MG capsule Take 100 mg by mouth daily as needed for mild constipation.    [provider]  GARLIC PO Take 1  tablet by mouth daily.    [provider]  polyethylene glycol (GOLYTELY) 236 g solution 2 L by mouth over 4 hours 03/04/17   Charlesetta Shanks, MD  Pramox-PE-Glycerin-Petrolatum (PREPARATION H) 1-0.25-14.4-15 % CREA Place 1 application rectally 4 (four) times daily as needed. Hemorrhoids    [provider]  traMADol (ULTRAM) 50 MG tablet Take 2 tablets (100 mg total) every 6 (six) hours as needed by mouth. 03/04/17   Charlesetta Shanks, MD  TURMERIC PO Take 1,000 tablets by mouth daily.    [provider]    Family History Family History  Problem Relation Age of Onset  . Cancer Other   . Diabetes Other     Social History Social History   Tobacco Use  . Smoking status: Current Some Day Smoker  . Smokeless tobacco: Never Used  Substance Use Topics  . Alcohol use: Yes    Comment: occ  . Drug use: Yes    Types: Marijuana    Comment: daily     Allergies   Patient has no known allergies.   Review of Systems Review of Systems  All other systems reviewed and are negative.    Physical Exam Updated Vital Signs BP 119/87 (BP Location: Right Arm)   Pulse 100   Temp 98.4 F (36.9 C) (Oral)   Resp 20   Ht 6\' 2"  (1.88  m)   Wt 63.5 kg (140 lb)   SpO2 100%   BMI 17.97 kg/m   Physical Exam  Constitutional: He appears well-developed and well-nourished.  Uncomfortable appearing.  HENT:  Head: Normocephalic.  Eyes: EOM are normal.  Neck: Normal range of motion.  Cardiovascular: Normal rate.  Pulmonary/Chest: Effort normal and breath sounds normal.  Abdominal: He exhibits no distension and no mass. There is no tenderness. There is no rebound and no guarding.  Musculoskeletal: Normal range of motion.  Neurological: He is alert.  Skin: Skin is warm.  Psychiatric: He has a normal mood and affect.  Nursing note and vitals reviewed.    ED Treatments / Results  Labs (all labs ordered are listed, but only abnormal results are displayed) Labs Reviewed   CBC - Abnormal; Notable for the following components:      Result Value   RBC 4.04 (*)    Hemoglobin 12.6 (*)    HCT 36.9 (*)    All other components within normal limits  COMPREHENSIVE METABOLIC PANEL    EKG  EKG Interpretation None       Radiology Dg Abd 2 Views  Result Date: 03/12/2017 CLINICAL DATA:  Pelvic and lower abdominal pain. History of colorectal cancer. Last chemotherapy 3 weeks ago. EXAM: ABDOMEN - 2 VIEW COMPARISON:  03/04/2017 FINDINGS: Gas and stool throughout the colon. Gas within some mid abdominal small bowel. No small or large bowel distention. No free intra-abdominal air. No abnormal air-fluid levels. No radiopaque stones. Visualized bones appear intact. No significant change since previous study. IMPRESSION: Nonobstructive bowel gas pattern.  Stool-filled colon.  No change. Electronically Signed   By: Lucienne Capers M.D.   On: 03/12/2017 04:58    Procedures Procedures (including critical care time)  Medications Ordered in ED Medications  polyethylene glycol-electrolytes (NuLYTELY/GoLYTELY) solution 4,000 mL (4,000 mLs Oral Given 03/12/17 0447)  dicyclomine (BENTYL) capsule 10 mg (10 mg Oral Given 03/12/17 0442)  ketorolac (TORADOL) injection 60 mg (60 mg Intramuscular Given 03/12/17 0443)     Initial Impression / Assessment and Plan / ED Course  I have reviewed the triage vital signs and the nursing notes.  Pertinent labs & imaging results that were available during my care of the patient were reviewed by me and considered in my medical decision making (see chart for details).     Stool-filled colon on plain film.  No signs of bowel obstruction.  Patient will be started on GoLYTELY.  He will need follow-up with the oncology team as scheduled on 03/17/2017.  He understands the importance of this follow-up to resume his treatment of his colon cancer as his obstipation will likely not improve without treatment.  Final Clinical Impressions(s) / ED  Diagnoses   Final diagnoses:  Abdominal pain    ED Discharge Orders    None       Jola Schmidt, MD 03/12/17 [phone]

## 2017-03-17 ENCOUNTER — Telehealth: Payer: Self-pay | Admitting: Oncology

## 2017-03-17 ENCOUNTER — Encounter: Payer: Self-pay | Admitting: Oncology

## 2017-03-17 ENCOUNTER — Ambulatory Visit (HOSPITAL_BASED_OUTPATIENT_CLINIC_OR_DEPARTMENT_OTHER): Payer: BLUE CROSS/BLUE SHIELD | Admitting: Oncology

## 2017-03-17 VITALS — BP 108/69 | HR 90 | Temp 99.0°F | Resp 18 | Ht 74.0 in | Wt 142.5 lb

## 2017-03-17 DIAGNOSIS — G893 Neoplasm related pain (acute) (chronic): Secondary | ICD-10-CM | POA: Diagnosis not present

## 2017-03-17 DIAGNOSIS — C20 Malignant neoplasm of rectum: Secondary | ICD-10-CM | POA: Diagnosis not present

## 2017-03-17 DIAGNOSIS — C19 Malignant neoplasm of rectosigmoid junction: Secondary | ICD-10-CM

## 2017-03-17 MED ORDER — SORBITOL 70 % PO SOLN: 30.0000 mL | Freq: Two times a day (BID) | ORAL | 2 refills | Status: DC | PRN

## 2017-03-17 MED ORDER — HYDROCODONE-ACETAMINOPHEN 5-325 MG PO TABS: 1.0000 | ORAL_TABLET | Freq: Four times a day (QID) | ORAL | 0 refills | Status: DC | PRN

## 2017-03-17 MED FILL — SORBITOL 70% SOLUTION: 70 | 20 days supply | Qty: 1200 | Fill #0

## 2017-03-17 MED FILL — HYDROCODON-APAP 5-325: 5-325 | 8 days supply | Qty: 30 | Fill #0

## 2017-03-17 NOTE — Progress Notes (Signed)
[facility] New Patient Consult   Referring UR:[facility]  Roni Scow. 41 y.o.  July 13, 1975    Reason for Referral: Rectal cancer   HPI: Mr. Geiman reports developing rectal pain, constipation, and rectal bleeding while living in [place] earlier this year.  He reports undergoing an upper endoscopy that revealed a small bowel "polyp ". He relocated to [place] and was taken to a colonoscopy 09/13/2016.  This confirmed a mass with the distal margin at 6 cm from the anal verge and a biopsy revealed adenocarcinoma with a MSS IHC profile. CTs of the chest, abdomen, and pelvis 09/26/2016 showed a rectal mass, pelvic lymphadenopathy and 2 indeterminate liver lesions.  A pelvic MRI 10/06/2016 showed invasion of the presacral fat and multiple lymph nodes ([id number]).  A chest CT was unremarkable. He was referred to medical oncology and neoadjuvant therapy was recommended.  He completed 1 cycle of Capox in July.  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  He reports developing depression and suicidal symptoms that required admission to a psychiatric unit.  He was referred to [facility] and was seen in medical oncology initially on 12/07/2016.  He was also seen by palliative care for help with pain control.  Placement of a Port-A-Cath and FOLFOX chemotherapy was recommended.  He completed 3 cycles of FOLFOX with the last cycle given 02/15/2017.  He reports cold sensitivity, toe cramping, and mouth sores following FOLFOX.  The mouth sores improved with a "mouthwash ".  He also had nausea following chemotherapy.  There is been no improvement in the constipation and rectal pain with chemotherapy.  He reports not taking pain medication for the past month.  He became homeless while in [place].  He was seen by palliative care on 02/10/2017 and prescribed tramadol.  The plan was to perform a reassessment after 4 cycles of FOLFOX.  Mr. Neyland relocated to [place] to live with  his mother.  The last CT in [place] on 02/09/2017 revealed subcentimeter left periaortic nodes, moderate distention of the colon with gas and stool.  A mass is noted at the rectum with adjacent fat stranding and perirectal lymph nodes.  An indeterminate hypodense lesion was noted in the left liver.   He is now living with his mother and a cousin, but plans to obtain his own housing beginning within the next few days. He was seen in the emergency room 03/12/2017 with pain.  He was treated with ketorolac and given GoLYTELY for constipation.  The GoLYTELY helped the constipation.  He continues to have severe pain.  Past Medical History:  Diagnosis Date  .  Rectal cancer dx'd 09/13/16    Past Surgical History:  Procedure Laterality Date  . COLONOSCOPY    . PORTA CATH INSERTION      Medications: Reviewed  Allergies: No Known Allergies  Family history: A paternal aunt had breast cancer, his paternal grandfather had pancreas cancer, a paternal uncle had colon and pancreas cancer, a maternal great uncle had testicular cancer  Social History:   He recently relocated to [place] to live with his mother.  He plans to obtain his own housing within the next few days.  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  He smokes cigarettes.  Rare alcohol use.  He smokes marijuana.  No other drug use.  No risk factor for HIV or hepatitis.  No transfusion history.  ROS:   Positives include: 40 pound weight loss,  avoids foods secondary to-unrelieved with [employer]GoLYTELY works, rectal bleeding, low back pain, rectal pain, urinary hesitancy when he is constipated, pain at the base of the scrotum  A complete ROS was otherwise negative.  Physical Exam:  Blood pressure 108/69, pulse 90, temperature 99 F (37.2 C), temperature source Oral, resp. rate 18, height 6\' 2"  (1.88 m), weight 142 lb 8 oz (64.6 kg), SpO2 100 %.  HEENT: Oropharynx without visible mass, neck without mass Lungs: Clear  bilaterally Cardiac: Regular rate and rhythm Abdomen: No hepatospleno megaly, no mass, tender in the suprapubic area, no hemorrhoids or mass visible on external anal exam GU: Testes without mass Vascular: No leg edema Lymph nodes: No cervical or supraclavicular nodes.  "Shotty "bilateral axillary and inguinal nodes Neurologic: Alert and oriented, the motor exam appears intact in the upper and lower extremities Skin: No rash Musculoskeletal: Mild tenderness at the sacrum   LAB:  CBC  Lab Results  Component Value Date   WBC 9.9 03/12/2017   HGB 12.6 (L) 03/12/2017   HCT 36.9 (L) 03/12/2017   MCV 91.3 03/12/2017   PLT 274 03/12/2017   NEUTROABS 4.7 03/04/2017        CMP     Component Value Date/Time   NA 136 03/12/2017 0414   K 3.8 03/12/2017 0414   CL 102 03/12/2017 0414   CO2 27 03/12/2017 0414   GLUCOSE 115 (H) 03/12/2017 0414   BUN 9 03/12/2017 0414   CREATININE 0.87 03/12/2017 0414   CALCIUM 8.6 (L) 03/12/2017 0414   PROT 6.6 03/12/2017 0414   ALBUMIN 3.8 03/12/2017 0414   AST 27 03/12/2017 0414   ALT 17 03/12/2017 0414   ALKPHOS 64 03/12/2017 0414   BILITOT 0.5 03/12/2017 0414   GFRNONAA >60 03/12/2017 0414   GFRAA >60 03/12/2017 0414     CEA on 02/15/2017: 6.8 ([place])  Imaging:  As per HPI, images not available for review today   Assessment/Plan:   1. 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  Moderately differentiated, MSS IHC profile  CTs 09/26/2016-rectal mass, pelvic lymphadenopathy, 2 indeterminate liver lesions  Abdomen MRI 10/06/2016 [phone]) tumor, negative chest CT  1 cycle of Capox July 2018  Transferred care to [facility] August 2018  Status post Port-A-Cath placement 12/14/2016  CT abdomen/pelvis 02/09/2017-mass at the left rectum with adjacent fat stranding and subcentimeter nodules, ill-defined hypodense lesion in the superior left  liver-indeterminate  3 cycles of FOLFOX, last cycle 02/15/2017 2.  Pain secondary to #1 3.  Constipation secondary to #1 and narcotics 4.  Psychiatric admission August 2018-depression/suicidal ideation, felt to be related to capecitabine per patient 5.  Family history of multiple cancers including pancreas and colon cancer  Disposition:   Mr. Discher has been diagnosed with rectal cancer.  He has been treated with a fragmented course of neoadjuvant therapy over the past several months.  It appears he has completed a total of 4 cycles of systemic therapy including 3 cycles of FOLFOX-last given 02/15/2017.  He has not experienced improvement in constipation or pain with the neoadjuvant therapy.  He has lived in multiple locations since presenting with rectal symptoms earlier this year.  He states that he plans to stay in [place] until he completes treatment for the rectal cancer.  I recommend proceeding with neoadjuvant infusional 5-fluorouracil and radiation since he has not experienced clinical improvement with oxaliplatin-based therapy.  I reviewed the potential toxicities associated with  5-fluorouracil including the chance for mucositis, diarrhea, hand/foot syndrome, and hyperpigmentation.  If the suicidal ideations were related to Decitabine it is possible there could be cross reactivity with 5-fluorouracil, but he denies these symptoms while on FOLFOX.  We made a radiation oncology referral.  We will obtain the CT and MRI images from [place].  I will present his case at the GI tumor conference.  The plan is to begin infusional 5-fluorouracil and concurrent radiation within the next few weeks.  Mr.Brisbane will try hydrocodone for pain.  He will begin sorbitol and Colace for constipation.  60 minutes were spent with the patient today.  The majority of the time was used for counseling and coordination of care.  Betsy Coder, MD  03/17/2017, 2:03 PM

## 2017-03-17 NOTE — Telephone Encounter (Signed)
Gave avs and calendar for 12/4 waiting for response regarding 12/10

## 2017-03-17 NOTE — Progress Notes (Signed)
START ON PATHWAY REGIMEN - Colorectal     Duration of XRT:     5-Fluorouracil   **Always confirm dose/schedule in your pharmacy ordering system**    Patient Characteristics: Rectal Neoadjuvant/Adjuvant, T3 - T4, N0 or Any T, N+, Neoadjuvant Therapy Current evidence of distant metastases<= No AJCC T Category: Staged < 8th Ed. AJCC N Category: Staged < 8th Ed. AJCC M Category: Staged < 8th Ed. AJCC 8 Stage Grouping: Staged < 8th Ed. Intent of Therapy: Curative Intent, Discussed with Patient

## 2017-03-20 ENCOUNTER — Telehealth: Payer: Self-pay | Admitting: Oncology

## 2017-03-20 ENCOUNTER — Telehealth: Payer: Self-pay | Admitting: Radiation Oncology

## 2017-03-20 NOTE — Progress Notes (Signed)
  Oncology Nurse Navigator Documentation  Navigator Location: [facility] (03/20/17 1259)   )Navigator Encounter Type: Letter/Fax/Email;Telephone (03/20/17 1259) Telephone: Outgoing Call(Document requests continuity of care) (03/20/17 1259)                       Barriers/Navigation Needs: Coordination of Care (03/20/17 1259)   Interventions: Coordination of Care;Other([employer], scan request from [place] providers) (03/20/17 1259)  Multiple calls and faxes to three medical facilities in [place] to obtain CT, MRI and colonoscopy records. Referral faxed to Dr. Annye English at [facility].          Acuity: Level 3 (03/20/17 1259)     Acuity Level 3: Ongoing guidance and education provided throughout treatment;Coordination of multimodality treatment;Other(Document requests from multiple facilities in [place]) (03/20/17 1259)   Time Spent with Patient: 120 (03/20/17 1259)

## 2017-03-20 NOTE — Telephone Encounter (Signed)
I asked patient if he had ever been treated with radiation before. Jacob Galvan states that, no, he has never been treated before with radiation.

## 2017-03-20 NOTE — Telephone Encounter (Signed)
Spoke to patient regarding upcoming December appointment updates.  °

## 2017-03-21 ENCOUNTER — Encounter: Payer: Self-pay | Admitting: Radiation Oncology

## 2017-03-21 ENCOUNTER — Ambulatory Visit
Admission: RE | Admit: 2017-03-21 | Discharge: 2017-03-21 | Disposition: A | Discharge status: Home / Self Care | Payer: BLUE CROSS/BLUE SHIELD | Source: Ambulatory Visit | Attending: Radiation Oncology | Admitting: Radiation Oncology

## 2017-03-21 ENCOUNTER — Other Ambulatory Visit: Payer: BLUE CROSS/BLUE SHIELD

## 2017-03-21 ENCOUNTER — Ambulatory Visit: Payer: BLUE CROSS/BLUE SHIELD

## 2017-03-21 ENCOUNTER — Telehealth: Payer: Self-pay | Admitting: Emergency Medicine

## 2017-03-21 ENCOUNTER — Ambulatory Visit
Admission: RE | Admit: 2017-03-21 | Payer: BLUE CROSS/BLUE SHIELD | Source: Ambulatory Visit | Admitting: Radiation Oncology

## 2017-03-21 DIAGNOSIS — Z79899 Other long term (current) drug therapy: Secondary | ICD-10-CM | POA: Insufficient documentation

## 2017-03-21 DIAGNOSIS — Z803 Family history of malignant neoplasm of breast: Secondary | ICD-10-CM | POA: Insufficient documentation

## 2017-03-21 DIAGNOSIS — Z79891 Long term (current) use of opiate analgesic: Secondary | ICD-10-CM | POA: Insufficient documentation

## 2017-03-21 DIAGNOSIS — Z800 Family history of malignant neoplasm of digestive organs: Secondary | ICD-10-CM | POA: Insufficient documentation

## 2017-03-21 DIAGNOSIS — Z833 Family history of diabetes mellitus: Secondary | ICD-10-CM | POA: Insufficient documentation

## 2017-03-21 DIAGNOSIS — Z9889 Other specified postprocedural states: Secondary | ICD-10-CM | POA: Insufficient documentation

## 2017-03-21 DIAGNOSIS — C20 Malignant neoplasm of rectum: Secondary | ICD-10-CM | POA: Insufficient documentation

## 2017-03-21 DIAGNOSIS — K6289 Other specified diseases of anus and rectum: Secondary | ICD-10-CM | POA: Insufficient documentation

## 2017-03-21 DIAGNOSIS — Z510 Encounter for antineoplastic radiation therapy: Secondary | ICD-10-CM | POA: Insufficient documentation

## 2017-03-21 DIAGNOSIS — R45851 Suicidal ideations: Secondary | ICD-10-CM | POA: Insufficient documentation

## 2017-03-21 DIAGNOSIS — F17200 Nicotine dependence, unspecified, uncomplicated: Secondary | ICD-10-CM | POA: Insufficient documentation

## 2017-03-21 HISTORY — DX: Malignant neoplasm of rectum: C20

## 2017-03-21 NOTE — Telephone Encounter (Signed)
Pt states he needs to cancel all appts for today 12/4. pts states he can only come on mondays when his mother is off of work. Radiation made aware of this.

## 2017-03-21 NOTE — Progress Notes (Signed)
  Oncology Nurse Navigator Documentation  Navigator Location: [facility] (03/21/17 1533)   )Navigator Encounter Type: Telephone (03/21/17 1533) Telephone: Outgoing Call (03/21/17 1533)                     Treatment Phase: Pre-Tx/Tx Discussion (03/21/17 1533) Barriers/Navigation Needs: Coordination of Care;Transportation;Financial (03/21/17 1533)   Interventions: Coordination of Care (03/21/17 1533)   Coordination of Care: Appts;Other(Social Work) (03/21/17 1533)  Patient called and cancelled his appointment with social work and rad/onc today due to transportation issues. I called and spoke with patient who stated that he did not have a ride today. His mother can only bring him to [facility] on Mondays. I explained to patient that his radiation treatments would be Monday through Friday and he verbalized understanding. He stated that once he qualifies for SCAT services transportation will be less of an issue. He is not currently staying with his mother but did not elaborate where. He did say that he can get to [facility] for his Chemo Ed appointment on 03/24/17. He verbalized understanding that he will need to meet with Stefanie Libel, financial advocate, and York Spaniel LCSW after his Chemo Education class on 03/24/17. I contacted Shona Simpson PA to reschedule his rad/onc appointments.      Acuity: Level 3 (03/21/17 1533)     Acuity Level 3: Ongoing guidance and education provided throughout treatment;Transportation issues;Coordination of multimodality treatment (03/21/17 1533)   Time Spent with Patient: 60 (03/21/17 1533)

## 2017-03-22 ENCOUNTER — Telehealth: Payer: Self-pay | Admitting: Oncology

## 2017-03-22 ENCOUNTER — Telehealth: Payer: Self-pay | Admitting: Nurse Practitioner

## 2017-03-22 ENCOUNTER — Ambulatory Visit
Admission: RE | Admit: 2017-03-22 | Discharge: 2017-03-22 | Disposition: A | Discharge status: Home / Self Care | Payer: BLUE CROSS/BLUE SHIELD | Source: Ambulatory Visit | Attending: Oncology | Admitting: Oncology

## 2017-03-22 ENCOUNTER — Telehealth: Payer: Self-pay | Admitting: *Deleted

## 2017-03-22 ENCOUNTER — Other Ambulatory Visit: Payer: Self-pay | Admitting: Oncology

## 2017-03-22 ENCOUNTER — Other Ambulatory Visit: Payer: BLUE CROSS/BLUE SHIELD

## 2017-03-22 ENCOUNTER — Encounter: Payer: Self-pay | Admitting: General Practice

## 2017-03-22 ENCOUNTER — Ambulatory Visit
Admission: RE | Admit: 2017-03-22 | Discharge: 2017-03-22 | Disposition: A | Discharge status: Home / Self Care | Payer: Self-pay | Source: Ambulatory Visit | Attending: Oncology | Admitting: Oncology

## 2017-03-22 DIAGNOSIS — C801 Malignant (primary) neoplasm, unspecified: Secondary | ICD-10-CM

## 2017-03-22 NOTE — Telephone Encounter (Signed)
Notified pt that [place] form has been completed and taken for scanning. Informed him he dropped off a form he was supposed to fill in. He will come in to sign the form this week.

## 2017-03-22 NOTE — Progress Notes (Signed)
[facility] CSW Progress Note  SCAT application prepared, left w [place], for patient signature.  CSW spoke w patient, informed him of need to sign form. Pt voiced agreement.  Edwyna Shell, LCSW Clinical Social Worker Phone:  [phone]

## 2017-03-22 NOTE — Telephone Encounter (Signed)
Scheduled appt per 12/4 sch message - patient is  Aware of appt date and time.

## 2017-03-22 NOTE — Progress Notes (Signed)
GI Location of Tumor / Histology: Rectal Cancer  Jacob Galvan. presented  months ago with symptoms of: Rectal pain, constipation, and rectal bleeding and tenderness in the suprapubic area  Biopsies of  (if applicable) revealed: Adenocarcinoma with a MSS IHC profile  Past/Anticipated interventions by surgeon, if any:  Past/Anticipated interventions by medical oncology, if any: Dr. Benay Spice 03-17-17 Radiation oncology referral, will obtain the CT and MRI images from [place].  I will present his case at the GI tumor conference. begin infusional 5-fluorouracil and concurrent radiation within the next few weeks.                                                                                                           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  Moderately differentiated, MSS IHC profile       CTs 09/26/2016-rectal mass, pelvic lymphadenopathy, 2 indeterminate liver lesions Abdomen MRI 10/06/2016 [phone]) tumor, negative chest CT cycle of Capox July 2018 Transferred care to [facility] August 2018 Status post Port-A-Cath placement 12/14/2016 CT abdomen/pelvis 02/09/2017-mass at the left rectum with adjacent fat stranding and subcentimeter nodules, ill-defined hypodense lesion in the superior left liver-indeterminate 3 cycles of FOLFOX, last cycle 02/15/2017       Pain secondary to #1       Constipation secondary to #1 and narcotics       Psychiatric admission August 2018-depression/suicidal ideation, felt to be related to capecitabine per patient  Weight changes, if any:   Bowel/Bladder complaints, if WUX:[id number],GUYQIH bleeding, Dr. Benay Spice 03-17-17 There is been no improvement in the constipation with chemotherapy.  He will begin sorbitol and Colace for constipation.  Nausea / Vomiting, if any:   Pain issues, if any: Rectal pain no improvement with chemotherapy. Dr. Benay Spice 03-17-17  Hydrocodone,He was seen by palliative care in [place] on 02/10/2017 and prescribed tramadol.  Any blood per rectum:Yes history rectal bleeding    SAFETY ISSUES:  Prior radiation? :  Pacemaker/ICD? : No  Possible current pregnancy? : No  Is the patient on methotrexate? : No  Current Complaints/Details: Jacob Galvan relocated to [place] to live with his mother from [place].

## 2017-03-22 NOTE — Telephone Encounter (Signed)
03/22/17 @ 2:58 pm successfully faxed Disability papers to [facility] @ [phone]. Called patient and he requested a to pick up a copy on Friday, 12/7 at his appointment.  Informed him a copy would be at front desk reception area.

## 2017-03-22 NOTE — Progress Notes (Signed)
  Oncology Nurse Navigator Documentation  Navigator Location: [facility] (03/22/17 1343)   )Navigator Encounter Type: Other(CD/Scans from [facility] uploaded/Radiology) (03/22/17 1343)    I received two CD's from facilities in [place] with patient scans. I walked cd's down to radiology to be uploaded. I also spoke with patient on several occasions today to confirm that he would be at his scheduled appointments on Friday. I also referred patient to [facility] to see Dr. Dema Severin.                                      Acuity: Level 3 (03/22/17 1343)     Acuity Level 3: Ongoing guidance and education provided throughout treatment;Coordination of multimodality treatment (03/22/17 1343)   Time Spent with Patient: 30 (03/22/17 1343)

## 2017-03-23 ENCOUNTER — Telehealth: Payer: Self-pay | Admitting: *Deleted

## 2017-03-23 NOTE — Telephone Encounter (Signed)
"  My son seen there as outpatient for cancer.  He's needs help, support, he's depressed.  Said he's giving up."  Has he said he wants to harm himself?   "Yes.  Has a doctor's appointment tomorrow but he's going through a lot.  Says he doesn't want to be a burden to everybody.  In pain.  Constipated by pain medications.  Profiled yesterday really upset him.  Just moved in a new home three days ago.  Neighbors called police reporting a black man breaking in the home.  Police wanted to search, investigate reported break-in when he answered door in [place].  In [place] had sessions with Psych therapist."   I'm asking if he's said he wants to die and or expressed a means to harm himself?  "Yes, he called me saying these types of things.  I'm at work leaving now to check on him." Get him to the emergency room as soon as possible.  Call 911.  Collaborative notified.   Possibly to begin protocol towards this situation.

## 2017-03-24 ENCOUNTER — Other Ambulatory Visit: Payer: Self-pay | Admitting: *Deleted

## 2017-03-24 ENCOUNTER — Other Ambulatory Visit: Payer: BLUE CROSS/BLUE SHIELD

## 2017-03-24 ENCOUNTER — Ambulatory Visit
Admission: RE | Admit: 2017-03-24 | Discharge: 2017-03-24 | Disposition: A | Discharge status: Home / Self Care | Payer: BLUE CROSS/BLUE SHIELD | Source: Ambulatory Visit | Attending: Radiation Oncology | Admitting: Radiation Oncology

## 2017-03-24 ENCOUNTER — Encounter: Payer: Self-pay | Admitting: Radiation Oncology

## 2017-03-24 ENCOUNTER — Encounter: Payer: Self-pay | Admitting: Oncology

## 2017-03-24 ENCOUNTER — Other Ambulatory Visit: Payer: Self-pay

## 2017-03-24 ENCOUNTER — Encounter: Payer: Self-pay | Admitting: General Practice

## 2017-03-24 ENCOUNTER — Other Ambulatory Visit (HOSPITAL_BASED_OUTPATIENT_CLINIC_OR_DEPARTMENT_OTHER): Payer: BLUE CROSS/BLUE SHIELD

## 2017-03-24 ENCOUNTER — Ambulatory Visit: Payer: BLUE CROSS/BLUE SHIELD | Admitting: Radiation Oncology

## 2017-03-24 VITALS — BP 102/72 | HR 113 | Temp 97.7°F | Resp 18 | Ht 74.0 in | Wt 142.4 lb

## 2017-03-24 DIAGNOSIS — C20 Malignant neoplasm of rectum: Secondary | ICD-10-CM | POA: Diagnosis not present

## 2017-03-24 DIAGNOSIS — C19 Malignant neoplasm of rectosigmoid junction: Secondary | ICD-10-CM

## 2017-03-24 DIAGNOSIS — Z800 Family history of malignant neoplasm of digestive organs: Secondary | ICD-10-CM | POA: Diagnosis not present

## 2017-03-24 DIAGNOSIS — F17200 Nicotine dependence, unspecified, uncomplicated: Secondary | ICD-10-CM | POA: Diagnosis not present

## 2017-03-24 DIAGNOSIS — Z803 Family history of malignant neoplasm of breast: Secondary | ICD-10-CM | POA: Diagnosis not present

## 2017-03-24 DIAGNOSIS — Z79899 Other long term (current) drug therapy: Secondary | ICD-10-CM | POA: Diagnosis not present

## 2017-03-24 DIAGNOSIS — Z79891 Long term (current) use of opiate analgesic: Secondary | ICD-10-CM | POA: Diagnosis not present

## 2017-03-24 DIAGNOSIS — Z510 Encounter for antineoplastic radiation therapy: Secondary | ICD-10-CM | POA: Diagnosis not present

## 2017-03-24 DIAGNOSIS — Z833 Family history of diabetes mellitus: Secondary | ICD-10-CM | POA: Diagnosis not present

## 2017-03-24 DIAGNOSIS — K6289 Other specified diseases of anus and rectum: Secondary | ICD-10-CM | POA: Diagnosis not present

## 2017-03-24 DIAGNOSIS — Z9889 Other specified postprocedural states: Secondary | ICD-10-CM | POA: Diagnosis not present

## 2017-03-24 DIAGNOSIS — R45851 Suicidal ideations: Secondary | ICD-10-CM | POA: Diagnosis not present

## 2017-03-24 LAB — COMPREHENSIVE METABOLIC PANEL
ALBUMIN: 4.1 g/dL (ref 3.5–5.0)
ALK PHOS: 63 U/L (ref 40–150)
ALT: 16 U/L (ref 0–55)
ANION GAP: 11 meq/L (ref 3–11)
AST: 23 U/L (ref 5–34)
BUN: 19.2 mg/dL (ref 7.0–26.0)
CALCIUM: 9.2 mg/dL (ref 8.4–10.4)
CHLORIDE: 105 meq/L (ref 98–109)
CO2: 22 mEq/L (ref 22–29)
Creatinine: 0.8 mg/dL (ref 0.7–1.3)
Glucose: 108 mg/dl (ref 70–140)
POTASSIUM: 4 meq/L (ref 3.5–5.1)
Sodium: 138 mEq/L (ref 136–145)
Total Bilirubin: 0.65 mg/dL (ref 0.20–1.20)
Total Protein: 7.4 g/dL (ref 6.4–8.3)

## 2017-03-24 LAB — CBC WITH DIFFERENTIAL/PLATELET
BASO%: 0.9 % (ref 0.0–2.0)
Basophils Absolute: 0.1 10*3/uL (ref 0.0–0.1)
EOS ABS: 0.5 10*3/uL (ref 0.0–0.5)
EOS%: 5.6 % (ref 0.0–7.0)
HCT: 42.9 % (ref 38.4–49.9)
HGB: 14.3 g/dL (ref 13.0–17.1)
LYMPH%: 20.7 % (ref 14.0–49.0)
MCH: 31 pg (ref 27.2–33.4)
MCHC: 33.3 g/dL (ref 32.0–36.0)
MCV: 93 fL (ref 79.3–98.0)
MONO#: 0.8 10*3/uL (ref 0.1–0.9)
MONO%: 9.1 % (ref 0.0–14.0)
NEUT%: 63.7 % (ref 39.0–75.0)
NEUTROS ABS: 5.6 10*3/uL (ref 1.5–6.5)
PLATELETS: 278 10*3/uL (ref 140–400)
RBC: 4.61 10*6/uL (ref 4.20–5.82)
RDW: 14.9 % — AB (ref 11.0–14.6)
WBC: 8.9 10*3/uL (ref 4.0–10.3)
lymph#: 1.8 10*3/uL (ref 0.9–3.3)

## 2017-03-24 LAB — CEA (IN HOUSE-CHCC): CEA (CHCC-IN HOUSE): 15.34 ng/mL — AB (ref 0.00–5.00)

## 2017-03-24 MED ORDER — HYDROCODONE-ACETAMINOPHEN 5-325 MG PO TABS: 1.0000 | ORAL_TABLET | Freq: Four times a day (QID) | ORAL | 0 refills | Status: DC | PRN

## 2017-03-24 MED FILL — HYDROCODON-APAP 5-325: 5-325 | 7 days supply | Qty: 30 | Fill #0

## 2017-03-24 NOTE — Progress Notes (Signed)
Documentation correction: Visited with Bryson Ha Perkins/PA

## 2017-03-24 NOTE — Progress Notes (Signed)
[facility] Spiritual Care Note  Received referral from Casandra Doffing, chemo ed instructor, who after class noted Jacob Galvan's 03/23/17 note re suicidal thoughts. Received subsequent referral from Ottis Stain in [place] re pt's VM request for massage certificates.  Met with [name] to assess needs and provide resources, later joined by [employer] Placke/RN and [employer] Perkins/NP. [name] repeatedly stated that he has no intention or plan to harm himself; in fact, he states that his goal is to reduce his pain, rather than risking anything that could increase it. 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  Per pt, the pain is so bad that he can feel desperate, but he is hopeful that treatment and support programs (massage therapy, support group, and hopefully a peer contact/mentor) will improve his QOL significantly. He explained that the only time in his life that he really felt depressed to the point of considering suicide was during past intensive chemo treatment, which, he states, is known to cause depression and negative thoughts; further, he states that stopping that chemo and "spending some time in a mental hospital after that" was very helpful in restoring him to a more steady emotional place.  CChijiokestates that he has several people he talks to for support, including his mother and his aunt, and that he will call 911 or seek immediate treatment at the ED if he has suicidal thoughts again.  Plan to follow for emotional support and connection with support resources. CIkeyhas full packet of SVernonteam/resource information and  plans to keep in touch with his team.   As always, please also page if immediate needs arise. Thank you.  [name] [name] [phone] [phone]

## 2017-03-24 NOTE — Progress Notes (Signed)
Radiation Oncology         [phone]) [phone] ________________________________  Name: Jacob Galvan.        MRN: [id number]  Date of Service: 03/24/2017 DOB: Feb 11, 1976  PQ:[id number], Pcp Not In  Ladell Pier, MD     REFERRING PHYSICIAN: Ladell Pier, MD   DIAGNOSIS: The encounter diagnosis was Rectal cancer [facility]).   HISTORY OF PRESENT ILLNESS: Jacob Shin. is a 41 y.o. male seen at the request of Dr. Benay Spice for a recently diagnosed but incompletely treated rectal cancer. The patient was previously living in [place] earlier in the spring, and developed rectal pain and constipation, and underwent colonoscopy after relocating to [place] on 09/13/2016 revealing a mass in the distal rectum 6 cm from the anal verge. A biopsy at that time revealed adenocarcinoma, and staging workup including CT of the chest abdomen and pelvis on 09/26/2016 revealed a mass with pelvic adenopathy and 2 indeterminate liver lesions. Pelvic MRI on 10/06/2016 revealed tumor invasion of the presacral fat and multiple regional perirectal nodes, CT chest was unremarkable. He completed 1 cycle of [place] ox in July and developed depression and suicidal and that he felt was related to Capecitabine, he was admitted to the psychiatric unit. He was then taken care of by medical oncology at [facility] and Port-A-Cath was placed in FOLFOX was recommended. He completed 3 cycles of FOLFOX with his most recent being given on 02/15/2017. He became homeless while in [place], and relocated to [place] to live with his sister. Of note his last CT scan in [place] revealed subcentimeter left periaortic nodes, moderate bili distention of the colon and the noted mass in the rectum with adjacent fat stranding and perirectal adenopathy. An indeterminate hypodense lesion was noted in the left liver, and this was on 02/09/2017. He met with Dr. Malachy Mood, and discussed options of completing neoadjuvant therapy, and comes today to consider  radiotherapy. He is to proceed with  5-FU during radiation. We anticipate a start date of 04/03/17.   PREVIOUS RADIATION THERAPY: No   PAST MEDICAL HISTORY:  Past Medical History:  Diagnosis Date  . Rectal cancer ([place]) 2018       PAST SURGICAL HISTORY: Past Surgical History:  Procedure Laterality Date  . COLONOSCOPY    . PORTA CATH INSERTION       FAMILY HISTORY:  Family History  Problem Relation Age of Onset  . Diabetes Other   . Breast cancer Maternal Grandmother   . Colon cancer Paternal Grandfather   . Pancreatic cancer Paternal Grandfather   . Cancer Paternal Aunt      SOCIAL HISTORY:  reports that he has been smoking.  he has never used smokeless tobacco. He reports that he drinks alcohol. He reports that he uses drugs. Drug: Marijuana. The patient is single. He is living in [place] and works for [employer], and is currently on disability given his cancer diagnosis.   ALLERGIES: Patient has no known allergies.   MEDICATIONS:  Current Outpatient Medications  Medication Sig Dispense Refill  . HYDROcodone-acetaminophen (NORCO/VICODIN) 5-325 MG tablet Take 1 tablet by mouth every 6 (six) hours as needed for moderate pain. 30 tablet 0  . sorbitol 70 % solution Take 30 mLs by mouth 2 (two) times daily as needed. 1200 mL 2  . diphenhydramine-acetaminophen (TYLENOL PM) 25-500 MG TABS tablet Take 2 tablets by mouth at bedtime as needed (pain, sleep).    . polyethylene glycol (GOLYTELY) 236 g solution 2 L  by mouth over 4 hours (Patient not taking: Reported on 03/12/2017) 2000 mL 2  . traMADol (ULTRAM) 50 MG tablet Take 2 tablets (100 mg total) every 6 (six) hours as needed by mouth. (Patient not taking: Reported on 03/12/2017) 20 tablet 0   No current facility-administered medications for this encounter.      REVIEW OF SYSTEMS: On review of systems, the patient reports that he is doing okay. He has been having thoughts of hurting himself when he has difficulty with  rectal pain. He describes intense pain in the low pelvis and rectum. He continues to have rectal bleeding and hematochezia, and has bleeding when he passes gas. He is taking Sorbitol and go lytely for constipation. He denies any chest pain, shortness of breath, cough, fevers, chills, night sweats. He has lost about 40 pounds in the last year. He denies any bladder disturbances, and denies abdominal pain, nausea or vomiting. He denies any new musculoskeletal or joint aches or pains. A complete review of systems is obtained and is otherwise negative.     PHYSICAL EXAM:  Wt Readings from Last 3 Encounters:  03/24/17 142 lb 6.4 oz (64.6 kg)  03/17/17 142 lb 8 oz (64.6 kg)  03/12/17 140 lb (63.5 kg)   Temp Readings from Last 3 Encounters:  03/24/17 97.7 F (36.5 C) (Oral)  03/17/17 99 F (37.2 C) (Oral)  03/12/17 98.4 F (36.9 C) (Oral)   BP Readings from Last 3 Encounters:  03/24/17 102/72  03/17/17 108/69  03/12/17 123/82   Pulse Readings from Last 3 Encounters:  03/24/17 (!) 113  03/17/17 90  03/12/17 92   Pain Assessment Pain Score: 10-Worst pain ever Pain Loc: Rectum(Rectum,side)/10  In general this is a well appearing African American male in no acute distress. He is alert and oriented x4 and appropriate throughout the examination. HEENT reveals that the patient is normocephalic, atraumatic. EOMs are intact. PERRLA. Skin is intact without any evidence of gross lesions. Cardiovascular exam reveals a regular rate and rhythm, no clicks rubs or murmurs are auscultated. Chest is clear to auscultation bilaterally. Lymphatic assessment is performed and does not reveal any adenopathy in the cervical, supraclavicular, axillary, or inguinal chains. Abdomen has active bowel sounds in all quadrants and is intact. The abdomen is soft, non tender, non distended. Lower extremities are negative for pretibial pitting edema, deep calf tenderness, cyanosis or clubbing.   ECOG = 1  0 - Asymptomatic  (Fully active, able to carry on all predisease activities without restriction)  1 - Symptomatic but completely ambulatory (Restricted in physically strenuous activity but ambulatory and able to carry out work of a light or sedentary nature. For example, light housework, office work)  2 - Symptomatic, <50% in bed during the day (Ambulatory and capable of all self care but unable to carry out any work activities. Up and about more than 50% of waking hours)  3 - Symptomatic, >50% in bed, but not bedbound (Capable of only limited self-care, confined to bed or chair 50% or more of waking hours)  4 - Bedbound (Completely disabled. Cannot carry on any self-care. Totally confined to bed or chair)  5 - Death   Jacob Galvan MM, Creech RH, Tormey DC, et al. [phone]). "Toxicity and response criteria of the [facility]". [place] Oncol. 5 (6): 649-55    LABORATORY DATA:  Lab Results  Component Value Date   WBC 8.9 03/24/2017   HGB 14.3 03/24/2017   HCT 42.9 03/24/2017  MCV 93.0 03/24/2017   PLT 278 03/24/2017   Lab Results  Component Value Date   NA 138 03/24/2017   K 4.0 03/24/2017   CL 102 03/12/2017   CO2 22 03/24/2017   Lab Results  Component Value Date   ALT 16 03/24/2017   AST 23 03/24/2017   ALKPHOS 63 03/24/2017   BILITOT 0.65 03/24/2017      RADIOGRAPHY: Dg Abd 2 Views  Result Date: 03/12/2017 CLINICAL DATA:  Pelvic and lower abdominal pain. History of colorectal cancer. Last chemotherapy 3 weeks ago. EXAM: ABDOMEN - 2 VIEW COMPARISON:  03/04/2017 FINDINGS: Gas and stool throughout the colon. Gas within some mid abdominal small bowel. No small or large bowel distention. No free intra-abdominal air. No abnormal air-fluid levels. No radiopaque stones. Visualized bones appear intact. No significant change since previous study. IMPRESSION: Nonobstructive bowel gas pattern.  Stool-filled colon.  No change. Electronically Signed   By: Lucienne Capers M.D.   On:  03/12/2017 04:58   Dg Abd Acute W/chest  Result Date: 03/04/2017 CLINICAL DATA:  Colorectal cancer diagnosed April 2018. Last chemotherapy treatment October 2018. Constant pain. Feels constipated. EXAM: DG ABDOMEN ACUTE W/ 1V CHEST COMPARISON:  CT abdomen and pelvis from 11/16/2016. FINDINGS: Normal heart size and mediastinal contours. Port catheter tip is seen at the cavoatrial junction. Lungs are clear. There is no free air beneath the diaphragm. Increased colonic stool burden is seen throughout the colon. No small bowel dilatation. No organomegaly or suspicious osseous lesions. IMPRESSION: Increased colonic stool burden consistent with constipation. Electronically Signed   By: Ashley Royalty M.D.   On: 03/04/2017 22:15       IMPRESSION/PLAN: 1. Stage III, adenocarcinoma of the rectum. Dr. Lisbeth Renshaw discusses the pathology findings and reviews the patient's prior course and pathology. He discusses the formulation of treatment plan based on prior clinical trials that gives [place] the guidelines for neoadjuvant treatment. The patient also is counsled on the role of a PET scan to determine if he has any additional adenopathy, as his last scan indicated concerns for locally advanced disease versus distant disease. We discussed the risks, benefits, short, and long term effects of radiotherapy, and the patient is interested in proceeding. Dr. Lisbeth Renshaw discusses the delivery and logistics of radiotherapy and anticipates a course of 5 1/2 weeks. We will proceed with simulation today. Written consent is obtained and placed in the chart, a copy was provided to the patient. He will proceed on 04/03/17. 2. Rectal pain. The patient will continue his current pain regimen that just began today. We will reassess the regimen as needed.  3. Suicidal ideation in the past. The patient is at a good point psychologically today and has met with the chaplain, and with navigation. He has a plan to contact 911 or our office if he is having  negative thoughts. He is also working through this as well with mental health providers. We are involving social work in his care as well.    The above documentation reflects my direct findings during this shared patient visit. Please see the separate note by Dr. Lisbeth Renshaw on this date for the remainder of the patient's plan of care.    Carola Rhine, PAC

## 2017-03-24 NOTE — Progress Notes (Signed)
  Oncology Nurse Navigator Documentation  Navigator Location: [facility] (03/24/17 1426)   )Navigator Encounter Type: Initial RadOnc (03/24/17 1426)                     Patient Visit Type: UDTHYH (03/24/17 1426) Treatment Phase: Pre-Tx/Tx Discussion (03/24/17 1426) Barriers/Navigation Needs: Transportation;Financial;Education;Coordination of Care (03/24/17 1426) Education: Coping with Diagnosis/ Prognosis;Understanding Cancer/ Treatment Options (03/24/17 1426) Interventions: Psycho-social support (03/24/17 1426)  I met with Patient, Jacob Galvan, Chaplain and Shona Simpson PA to talk to patient about coping skills for pain and constipation. Patient reports having "dark thoughts" when dealing with his pain and diagnosis. Patient denies current suicidal ideation and verbalized that if he felt that he could not keep himself safe that he would call 911 or his mother. Patient states that he wants to live to be there for his five children and one grandchild. Patient verbalized encouragement that he can participate in the GI support group.           Acuity: Level 3 (03/24/17 1426)     Acuity Level 3: Ongoing guidance and education provided throughout treatment;Emotional needs;Coordination of multimodality treatment (03/24/17 1426)   Time Spent with Patient: 30 (03/24/17 1426)

## 2017-03-24 NOTE — Progress Notes (Signed)
Met with patient after chemo ed class to have sign Scat application for social worker(Anne C). Patient signed application. I scanned and emailed to [employer].  Discussed with patient Export details. Patient currently receiving disability. Approved patient for one-time $400 grant. Patient has a copy of the approval letter as well as the expense sheet. Patient has my card for any additional financial questions or concerns as well as Anne's card.  Patient had a concern about being out of Hydrocodone. Called Tanya RN to discuss with patient and had patient to sit by elevator.  Patient verbalized understanding.

## 2017-03-26 ENCOUNTER — Other Ambulatory Visit: Payer: Self-pay | Admitting: Oncology

## 2017-03-27 ENCOUNTER — Ambulatory Visit: Payer: BLUE CROSS/BLUE SHIELD | Admitting: Nurse Practitioner

## 2017-03-27 ENCOUNTER — Ambulatory Visit: Payer: BLUE CROSS/BLUE SHIELD

## 2017-03-27 ENCOUNTER — Ambulatory Visit: Payer: BLUE CROSS/BLUE SHIELD | Admitting: Radiation Oncology

## 2017-03-28 ENCOUNTER — Encounter: Payer: Self-pay | Admitting: *Deleted

## 2017-03-28 ENCOUNTER — Telehealth: Payer: Self-pay | Admitting: Oncology

## 2017-03-28 DIAGNOSIS — C20 Malignant neoplasm of rectum: Secondary | ICD-10-CM

## 2017-03-28 MED ORDER — OXYCODONE HCL 5 MG PO TABS: 5.0000 mg | ORAL_TABLET | ORAL | 0 refills | Status: DC | PRN

## 2017-03-28 MED FILL — oxyCODONE HCL 5 MG TABS: 5 | 8 days supply | Qty: 50 | Fill #0

## 2017-03-28 NOTE — Telephone Encounter (Signed)
Returned call to pt, he reports he is taking Hydrocodone 2 tabs Q6 hours for pain. Last dose was 2 hours ago rates abdominal pain at 9/10. Pt reports he is out of pain med now.

## 2017-03-28 NOTE — Telephone Encounter (Signed)
Called patient to reschedule appts from 12/10. Patient stated he needed refill on Hydrocodone.  Informed patient I would route to Dr. Gearldine Shown nurse for review and follow up.

## 2017-03-28 NOTE — Addendum Note (Signed)
Addended by: Brien Few on: 03/28/2017 04:46 PM   Modules accepted: Orders

## 2017-03-28 NOTE — Telephone Encounter (Signed)
Reviewed call with Dr. Benay Spice. Order received for Oxycodone. Called pt with instructions to take one Q4 hours. Informed him prescription will not be filled early. Take as prescribed. He voiced understanding.

## 2017-03-28 NOTE — Progress Notes (Signed)
[place] Work  Holiday representative contacted patient at home to offer support and discuss support services at [facility].  Patient expressed interest in the GI support group.  CSW provided education on support groups and meeting information.  Patient stated he planned to attend the next scheduled group.  CSW provided contact information and encouraged patient to call with questions or concerns.  Johnnye Lana, MSW, LCSW, OSW-C Clinical Social Worker [facility] [phone]

## 2017-03-29 ENCOUNTER — Encounter: Payer: Self-pay | Admitting: Genetics

## 2017-03-29 ENCOUNTER — Ambulatory Visit: Payer: BLUE CROSS/BLUE SHIELD

## 2017-03-29 ENCOUNTER — Encounter: Payer: Self-pay | Admitting: *Deleted

## 2017-03-29 ENCOUNTER — Other Ambulatory Visit: Payer: BLUE CROSS/BLUE SHIELD

## 2017-03-29 ENCOUNTER — Ambulatory Visit
Admission: RE | Admit: 2017-03-29 | Discharge: 2017-03-29 | Disposition: A | Discharge status: Home / Self Care | Payer: BLUE CROSS/BLUE SHIELD | Source: Ambulatory Visit | Attending: Radiation Oncology | Admitting: Radiation Oncology

## 2017-03-29 ENCOUNTER — Ambulatory Visit (HOSPITAL_BASED_OUTPATIENT_CLINIC_OR_DEPARTMENT_OTHER): Payer: BLUE CROSS/BLUE SHIELD | Admitting: Genetics

## 2017-03-29 ENCOUNTER — Encounter: Payer: Self-pay | Admitting: Nurse Practitioner

## 2017-03-29 ENCOUNTER — Ambulatory Visit (HOSPITAL_BASED_OUTPATIENT_CLINIC_OR_DEPARTMENT_OTHER): Payer: BLUE CROSS/BLUE SHIELD | Admitting: Nurse Practitioner

## 2017-03-29 ENCOUNTER — Encounter: Payer: Self-pay | Admitting: Radiation Oncology

## 2017-03-29 VITALS — BP 100/63 | HR 105 | Temp 98.5°F | Resp 20 | Ht 74.0 in | Wt 144.0 lb

## 2017-03-29 DIAGNOSIS — G893 Neoplasm related pain (acute) (chronic): Secondary | ICD-10-CM

## 2017-03-29 DIAGNOSIS — Z800 Family history of malignant neoplasm of digestive organs: Secondary | ICD-10-CM | POA: Insufficient documentation

## 2017-03-29 DIAGNOSIS — C20 Malignant neoplasm of rectum: Secondary | ICD-10-CM

## 2017-03-29 DIAGNOSIS — Z510 Encounter for antineoplastic radiation therapy: Secondary | ICD-10-CM | POA: Diagnosis not present

## 2017-03-29 DIAGNOSIS — C189 Malignant neoplasm of colon, unspecified: Secondary | ICD-10-CM

## 2017-03-29 DIAGNOSIS — Z8043 Family history of malignant neoplasm of testis: Secondary | ICD-10-CM | POA: Insufficient documentation

## 2017-03-29 DIAGNOSIS — Z808 Family history of malignant neoplasm of other organs or systems: Secondary | ICD-10-CM

## 2017-03-29 MED ORDER — OXYCODONE-ACETAMINOPHEN 5-325 MG PO TABS
1.0000 | ORAL_TABLET | Freq: Once | ORAL | Status: AC
  Administered 2017-03-29: 1 via ORAL

## 2017-03-29 MED ORDER — OXYCODONE-ACETAMINOPHEN 5-325 MG PO TABS
ORAL_TABLET | ORAL | Status: AC
  Filled 2017-03-29: qty 1

## 2017-03-29 MED ORDER — DOCUSATE SODIUM 50 MG PO CAPS: 50.0000 mg | ORAL_CAPSULE | Freq: Two times a day (BID) | ORAL | 0 refills | Status: DC

## 2017-03-29 NOTE — Progress Notes (Signed)
[place] Psychosocial Distress Screening Clinical Social Work  Clinical Social Work was referred by distress screening protocol.  The patient scored a 8 on the Psychosocial Distress Thermometer which indicates moderate distress.  [facility] met with patient on 03/24/17.  Patients concerns were addressed and appropriate interventions were provided.  CSW also contacted patient on 03/28/17 to follow up and provide additional support.  Patient expressed interest in attending GI support group.  Patient also completed SCAT application; which was submitted.  CSW provided contact information and encouraged patient to call with questions or concerns.   ONCBCN DISTRESS SCREENING 03/24/2017  Screening Type Initial Screening  Distress experienced in past week (1-10) 8  Practical problem type Housing;Transportation;Food  Family Problem type Partner  Emotional problem type Depression;Nervousness/Anxiety;Adjusting to illness;Isolation/feeling alone;Feeling hopeless;Boredom;Adjusting to appearance changes  Spiritual/Religous concerns type Facing my mortality;Loss of sense of purpose  Information Concerns Type Lack of info about diagnosis;Lack of info about treatment;Lack of info about complementary therapy choices;Lack of info about maintaining fitness  Physical Problem type Sleep/insomnia;Getting around;Constipation/diarrhea;Sexual problems;Skin dry/itchy;Loss of appetitie;Changes in urination  Physician notified of physical symptoms Yes    Johnnye Lana, MSW, LCSW, OSW-C Clinical Social Worker [facility] [phone]

## 2017-03-29 NOTE — Progress Notes (Addendum)
REFERRING PROVIDER: Hayden Pedro, PA-C [place], [place] [address]   PRIMARY PROVIDER:  System, Pcp Not In  PRIMARY REASON FOR VISIT:  1. Rectal cancer ([place])   2. Family history of colon cancer   3. Family history of melanoma   4. Family history of pancreatic cancer   5. Family history of testicular cancer   6. Malignant neoplasm of colon, unspecified part of colon ([place])      HISTORY OF PRESENT ILLNESS:   Jacob Galvan, a 41 y.o. male, was seen for a [place] cancer genetics consultation at the request of Dr. No ref. provider found due to a personal and family history of cancer.  Jacob Galvan presents to clinic today to discuss the possibility of a hereditary predisposition to cancer, genetic testing, and to further clarify his future cancer risks, as well as potential cancer risks for family members.   On 09/13/2016, at the age of 79, Jacob Galvan was diagnosed with adenocarcinoma with a MSS IHC profile.  He was referred to medical oncology and neoadjuvant therapy was recommended.  He has completed some chemotherapy the past few months.    Past Medical History:  Diagnosis Date  . Colon cancer ([place])   . Rectal cancer ([place]) 2018    Past Surgical History:  Procedure Laterality Date  . COLONOSCOPY    . PORTA CATH INSERTION      Social History   Socioeconomic History  . Marital status: Single    Spouse name: Not on file  . Number of children: Not on file  . Years of education: Not on file  . Highest education level: Not on file  Social Needs  . Financial resource strain: Not on file  . Food insecurity - worry: Not on file  . Food insecurity - inability: Not on file  . Transportation needs - medical: Not on file  . Transportation needs - non-medical: Not on file  Occupational History  . Not on file  Tobacco Use  . Smoking status: Current Some Day Smoker  . Smokeless tobacco: Never Used  Substance and Sexual Activity  . Alcohol use: Yes    Comment: occ  .  Drug use: Yes    Types: Marijuana    Comment: daily  . Sexual activity: Yes  Other Topics Concern  . Not on file  Social History Narrative  . Not on file     FAMILY HISTORY:  We obtained a detailed, 4-generation family history.  Significant diagnoses are listed below:  Family History  Problem Relation Age of Onset  . Diabetes Other   . Diabetes Maternal Grandmother   . Colon cancer Paternal Grandfather   . Pancreatic cancer Paternal Grandfather 72  . Breast cancer Paternal Aunt 87  . Melanoma Sister 2       x2  . Testicular cancer Maternal Grandfather 31  . COPD Paternal Grandmother   . Colon cancer Paternal Uncle 35  . Pancreatic cancer Paternal Uncle   . Testicular cancer Other   . Heart Problems Son 5   Jacob Galvan has 3 daughters (ages 49, 38, and 21) and 2 sons 76 ages 31 and 88) with no history of cancer.  He has 1 grandson who is healthy.   Jacob Galvan has a paternal half-sister who is 25 and has had 2 melanomas.  He has a maternal half-sister who is 31 with no history of cancer.  She has a 87 year-old daughter and 55 year-old son.  Jacob Galvan 's father is 69 with no history of cancer.  Jacob Galvan paternal uncle died of pancreatic cancer and colon cancer diagnosed in his 2's.  Jacob Galvan has 3 paternal aunts.  One was diagnosed with breast cancer in her 31's.  Jacob Galvan paternal grandfather had pancreatic cancer and colon cancer at 74 and his paternal grandmother is 76 and has COPD.    Jacob Galvan mother is 80 with no history of cancer. She has 4 siblings with no history of cancer.  Jacob Galvan reports that there is a significant history of cancer in his cousins on his mother's side of the family but does not know many details details or what types of cancer there are in the family.  He reports that his maternal grandfather had testicular cancer in his 51's and his paternal grandmother had diabetes, but no history of cancer.   His maternal grandmother's brother had testicular cancer as  well.    Jacob Galvan is unaware of previous family history of genetic testing for hereditary cancer risks. Patient's maternal ancestors are of Black/Caucaisan descent, and paternal ancestors are of Black descent. There is no reported Ashkenazi Jewish ancestry. There is no known consanguinity.  GENETIC COUNSELING ASSESSMENT: Jacob Galvan. is a 41 y.o. male with a personal and family history which is somewhat suggestive of a Hereditary Cancer Predisposition Syndrome. We, therefore, discussed and recommended the following at today's visit.   DISCUSSION: We reviewed the characteristics, features and inheritance patterns of hereditary cancer syndromes. We also discussed genetic testing, including the appropriate family members to test, the process of testing, insurance coverage and turn-around-time for results. We discussed the implications of a negative, positive and/or variant of uncertain significant result. We recommended Jacob Galvan pursue genetic testing for the Common Hereditary Cancer gene panel + Melanoma Panel. The Common Hereditary Cancer Gene Panel offered by [facility] includes sequencing and/or deletion duplication testing of the following 47 genes: APC, ATM, AXIN2, BARD1, BMPR1A, BRCA1, BRCA2, BRIP1, CDH1, CDKN2A (p14ARF), CDKN2A (p16INK4a), CKD4, CHEK2, CTNNA1, DICER1, EPCAM (Deletion/duplication testing only), GREM1 (promoter region deletion/duplication testing only), KIT, MEN1, MLH1, MSH2, MSH3, MSH6, MUTYH, NBN, NF1, NHTL1, PALB2, PDGFRA, PMS2, POLD1, POLE, PTEN, RAD50, RAD51C, RAD51D, SDHB, SDHC, SDHD, SMAD4, SMARCA4. STK11, TP53, TSC1, TSC2, and VHL.  The following genes were evaluated for sequence changes only: SDHA and HOXB13 c.251G>A variant only.  Melanoma Panel: Primary panel (9 genes)  BAP1, BRCA2, CDK4, CDKN2A, MITF, POT1, PTEN RB1 TP53 , BRCA1, MC1R, TERT  We discussed that only 5-10% of cancers are associated with a Hereditary Cancer Predisposition Syndrome.  The most common hereditary  cancer syndrome associated with colon cancer is Lynch Syndrome.  Lynch Syndrome is caused by mutations in the genes: MLH1, MSH2, MSH6, PMS2 and EPCAM.  This syndrome increases the risk for colon, uterine, ovarian and stomach cancers, as well as others.  Families with Lynch Syndrome tend to have multiple family members with these cancers, typically diagnosed under age 47, and diagnoses in multiple generations.    We discussed that there are several other genes that are associated with an increased risk for colon cancer and increased polyp burden (MUTYH, APC, POLE, CHEK2, etc.) We also dicussed that there are many genes that cause many different types of cancer risks.   There is a condition called FAMMM (Familial Atypical Multiple Mole Melanoma Syndrome) is also possible.  Individuals with FAMMM have an  increased risk to develop pancreatic cancer, melanoma, and possibly some other malignancies.  Given the family history of pancreatic cancer and melanoma this is also int he differential dx.   We discussed that if he is found to have a mutation in one of these genes, it may impact future medical management recommendations such as increased cancer screenings and consideration of risk reducing surgeries.  A positive result could also have implications for the patient's family members.  A Negative result would mean we were unable to identify a hereditary component to her cancer, but does not rule out the possibility of a hereditary basis for her cancer.  There could be mutations that are undetectable by current technology, or in genes not yet tested or identified to increase cancer risk.    We discussed the potential to find a Variant of Uncertain Significance or VUS.  These are variants that have not yet been identified as pathogenic or benign, and it is unknown if this variant is associated with increased cancer risk or if this is a normal finding.  Most VUS's are reclassified to benign or likely benign.   It  should not be used to make medical management decisions. With time, we suspect the lab will determine the significance of any VUS's identified if any.   Based on Jacob Galvan personal and family history of cancer, he meets medical criteria for genetic testing. Despite that he meets criteria, he may still have an out of pocket cost. We discussed that if his out of pocket cost for testing is over $100, the laboratory will call and confirm whether he wants to proceed with testing.  If the out of pocket cost of testing is less than $100 he will be billed by the genetic testing laboratory.   In order to estimate his chance of having a Lunch Syndrome gene mutation the statistical model PREMM5 was used.  This  Model is based on family and personal history of Lynch-syndrome associated cancers.  This predicts his risk to have a Lynch Syndrome mutation is 23.3%.   These numbers must be considered a rough estimate and not a precise risk of having a Lynch Syndrome mutation.      Previous notes from his treatment at [facility] report his tumor had a  a MSS IHC profile.  This is less suspicious for Lynch Syndrome, but is a screening test and can miss some cases of Lynch Syndrome.   PLAN: After considering the risks, benefits, and limitations, Jacob Galvan  provided informed consent to pursue genetic testing and the blood sample was sent to [facility] for analysis of the Common Hereditary Cancer Panel + Melanoma Panel. Results should be available within approximately 2-3 weeks' time, at which point they will be disclosed by telephone to Jacob Galvan, as will any additional recommendations warranted by these results. Jacob Galvan will receive a summary of his genetic counseling visit and a copy of his results once available. This information will also be available in Epic. We encouraged Mr. Rottmann to remain in contact with cancer genetics annually so that we can continuously update the family history and inform him of any  changes in cancer genetics and testing that may be of benefit for his family. Mr. Bellamy questions were answered to his satisfaction today. Our contact information was provided should additional questions or concerns arise.  Based on Mr. Gibbs family history, we recommended his paternal relatives have genetic counseling and genetic testing.   Mr. Cybulski will let us know if we can be of any assistance in coordinating genetic counseling and/or  testing for this family member.   Mr. Null was in significant pain during his appointment today.  He reported that he did not take his pain medication today because he had to drive, and following our discussion he was seen by nurses sent from symptom management/triage.    Lastly, we encouraged Mr. Drumwright to remain in contact with cancer genetics annually so that we can continuously update the family history and inform him of any changes in cancer genetics and testing that may be of benefit for this family.   Mr.  Hinch questions were answered to his satisfaction today. Our contact information was provided should additional questions or concerns arise. Thank you for the referral and allowing [place] to share in the care of your patient.   Tana Felts, MS Genetic Counselor [email or web address] phone: [phone]  The patient was seen for a total of 30 minutes in face-to-face genetic counseling.

## 2017-03-29 NOTE — Progress Notes (Signed)
  [facility] OFFICE PROGRESS NOTE   Diagnosis: Rectal cancer  INTERVAL HISTORY:   Mr. Bynum returns for follow-up.  He continues to have significant pain at the rectum.  He also notes discomfort at the low abdomen.  He takes oxycodone as needed.  He is taking a laxative.  He would like a prescription for a stool softener.  Docusate has worked well in the past.  He reports fairly good oral intake.  Objective:  Vital signs in last 24 hours:  Blood pressure 100/63, pulse (!) 105, temperature 98.5 F (36.9 C), temperature source Oral, resp. rate 20, height 6\' 2"  (1.88 m), weight 144 lb (65.3 kg), SpO2 100 %.    HEENT: No thrush or ulcers. Resp: Lungs clear bilaterally. Cardio: Regular rate and rhythm. GI: Abdomen soft and nontender.  No hepatomegaly. Vascular: No leg edema. Port-A-Cath without erythema.  Lab Results:  Lab Results  Component Value Date   WBC 8.9 03/24/2017   HGB 14.3 03/24/2017   HCT 42.9 03/24/2017   MCV 93.0 03/24/2017   PLT 278 03/24/2017   NEUTROABS 5.6 03/24/2017    Imaging:  No results found.  Medications: I have reviewed the patient's current medications.  Assessment/Plan: 1. 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  Moderately differentiated, MSS IHC profile  CTs 09/26/2016-rectal mass, pelvic lymphadenopathy, 2 indeterminate liver lesions ? Abdomen MRI 10/06/2016 [phone]) tumor, negative chest CT ? 1 cycle of Capox July 2018 ? Transferred care to [facility] center August 2018 ? Status post Port-A-Cath placement 12/14/2016 ? CT abdomen/pelvis 02/09/2017-mass at the left rectum with adjacent fat stranding and subcentimeter nodules, ill-defined hypodense lesion in the superior left liver-indeterminate ? 3 cycles of FOLFOX, last cycle 02/15/2017 2.  Pain secondary to #1 3.  Constipation secondary to #1 and narcotics 4.  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  Family history of multiple cancers including pancreas and colon cancer  Disposition: Mr. Burkett appears unchanged.  He is scheduled for radiation simulation today with plans to begin neoadjuvant radiation/infusional 5-fluorouracil 04/03/2017.  He will return for labs/5-FU pump change 04/10/2017.  We will see him in follow-up on 04/17/2017.  He will contact the office in the interim with any problems.  A prescription was sent to his pharmacy for docusate 50 mg twice daily.  Plan reviewed with Dr. Benay Spice.  Ned Card ANP/GNP-BC   03/29/2017  2:23 PM

## 2017-03-29 NOTE — Progress Notes (Signed)
Addendum:  The patient's history has been well outlined and summarized by Dr. Benay Spice and myself after reviewing his outside records from [place]. When he presented his tumor was staged as a Stage IIIC, T4bN2M0 adenocarcinoma of the rectum. He's currently awaiting insurance authorization for PET scan so we can start neoadjuvant treatment with chemoRT.     Carola Rhine, PAC

## 2017-03-30 ENCOUNTER — Encounter: Payer: Self-pay | Admitting: General Practice

## 2017-03-30 NOTE — Progress Notes (Signed)
[facility] Progress Notes  [facility] [phone]) has prequalified patient for transport needs - is certified through Jan 2019.  Will need to have qualification interwiew in Jan - [facility] will call patient to schedule.  Patient aware of this, CSW informed by phone.  Patient aware that he must call [facility] directly to schedule his transport needs.  Edwyna Shell, LCSW Clinical Social Worker Phone:  [phone]

## 2017-03-31 ENCOUNTER — Telehealth: Payer: Self-pay | Admitting: Oncology

## 2017-03-31 DIAGNOSIS — Z510 Encounter for antineoplastic radiation therapy: Secondary | ICD-10-CM | POA: Diagnosis not present

## 2017-03-31 NOTE — Telephone Encounter (Signed)
Spoke to patient regarding upcoming December appointments.  °

## 2017-03-31 NOTE — Progress Notes (Signed)
  Radiation Oncology         [phone]) [phone] ________________________________  Name: Jacob Galvan. MRN: [id number]  Date: 03/29/2017  DOB: Sep 06, 1975  Optical Surface Tracking Plan:  Since intensity modulated radiotherapy (IMRT) and 3D conformal radiation treatment methods are predicated on accurate and precise positioning for treatment, intrafraction motion monitoring is medically necessary to ensure accurate and safe treatment delivery.  The ability to quantify intrafraction motion without excessive ionizing radiation dose can only be performed with optical surface tracking. Accordingly, surface imaging offers the opportunity to obtain 3D measurements of patient position throughout IMRT and 3D treatments without excessive radiation exposure.  I am ordering optical surface tracking for this patient's upcoming course of radiotherapy. ________________________________  Kyung Rudd, MD 03/31/2017 7:36 AM    Reference:   Ursula Alert, J, et al. Surface imaging-based analysis of intrafraction motion for breast radiotherapy patients.Journal of [place], n. 6, nov. 2014. ISSN [id number].   Available at: <http://www.jacmp.org/index.php/jacmp/article/view/4957>.

## 2017-03-31 NOTE — Progress Notes (Signed)
  Radiation Oncology         [phone]) [phone] ________________________________  Name: Jacob Galvan. MRN: [id number]  Date: 03/29/2017  DOB: November 21, 1975   SIMULATION AND TREATMENT PLANNING NOTE  DIAGNOSIS:     ICD-10-CM   1. Rectal cancer ([place]) C20      The patient presented for simulation for the patient's upcoming course of radiation for the diagnosis of rectal cancer. The patient was placed in a supine position. A customized vac-lock bag was constructed to aid in patient immobilization on. This complex treatment device will be used on a daily basis during the treatment. In this fashion a CT scan was obtained through the pelvic region and the isocenter was placed near midline within the pelvis. Surface markings were placed.  The patient's imaging was loaded into the radiation treatment planning system. The patient will initially be planned to receive a course of radiation to a dose of 45 Gy. This will be accomplished in 25 fractions at 1.8 gray per fraction. This initial treatment will correspond to a 3-D conformal technique. The target has been contoured in addition to the rectum, bladder and femoral heads. Dose volume histograms of each of these structures have been requested and these will be carefully reviewed as part of the 3-D conformal treatment planning process. To accomplish this initial treatment, 4 customized blocks have been designed for this purpose. Each of these 4 complex treatment devices will be used on a daily basis during the initial course of the treatment. It is anticipated that the patient will then receive a boost for an additional 5.4 Gy. The anticipated total dose therefore will be 50.4 Gy.    Special treatment procedure The patient will receive chemotherapy during the course of radiation treatment. The patient may experience increased or overlapping toxicity due to this combined-modality approach and the patient will be monitored for such problems. This may include extra  lab work as necessary. This therefore constitutes a special treatment procedure.    ________________________________  Jodelle Gross, MD, PhD

## 2017-04-03 ENCOUNTER — Ambulatory Visit (HOSPITAL_BASED_OUTPATIENT_CLINIC_OR_DEPARTMENT_OTHER): Payer: BLUE CROSS/BLUE SHIELD

## 2017-04-03 ENCOUNTER — Ambulatory Visit
Admission: RE | Admit: 2017-04-03 | Discharge: 2017-04-03 | Disposition: A | Discharge status: Home / Self Care | Payer: BLUE CROSS/BLUE SHIELD | Source: Ambulatory Visit | Attending: Radiation Oncology | Admitting: Radiation Oncology

## 2017-04-03 ENCOUNTER — Ambulatory Visit: Payer: BLUE CROSS/BLUE SHIELD

## 2017-04-03 ENCOUNTER — Encounter: Payer: Self-pay | Admitting: General Practice

## 2017-04-03 VITALS — BP 93/69 | HR 104 | Temp 98.7°F | Resp 18

## 2017-04-03 DIAGNOSIS — Z5111 Encounter for antineoplastic chemotherapy: Secondary | ICD-10-CM | POA: Diagnosis not present

## 2017-04-03 DIAGNOSIS — C20 Malignant neoplasm of rectum: Secondary | ICD-10-CM

## 2017-04-03 DIAGNOSIS — Z510 Encounter for antineoplastic radiation therapy: Secondary | ICD-10-CM | POA: Diagnosis not present

## 2017-04-03 MED ORDER — OXYCODONE-ACETAMINOPHEN 5-325 MG PO TABS
1.0000 | ORAL_TABLET | Freq: Once | ORAL | Status: AC
  Administered 2017-04-03: 1 via ORAL

## 2017-04-03 MED ORDER — OXYCODONE-ACETAMINOPHEN 5-325 MG PO TABS
ORAL_TABLET | ORAL | Status: AC
  Filled 2017-04-03: qty 1

## 2017-04-03 MED ORDER — SODIUM CHLORIDE 0.9 % IV SOLN
225.0000 mg/m2/d | INTRAVENOUS | Status: DC
  Administered 2017-04-03: 2900 mg via INTRAVENOUS
  Filled 2017-04-03: qty 58

## 2017-04-03 MED ORDER — SODIUM CHLORIDE 0.9% FLUSH
10.0000 mL | INTRAVENOUS | Status: DC | PRN
  Filled 2017-04-03: qty 10

## 2017-04-03 NOTE — Patient Instructions (Signed)
[place] [facility] Discharge Instructions for Patients Receiving Chemotherapy  Today you received the following chemotherapy agents Adrucil  To help prevent nausea and vomiting after your treatment, we encourage you to take your nausea medication as prescribed by MD.    If you develop nausea and vomiting that is not controlled by your nausea medication, call the clinic.   BELOW ARE SYMPTOMS THAT SHOULD BE REPORTED IMMEDIATELY:  *FEVER GREATER THAN 100.5 F  *CHILLS WITH OR WITHOUT FEVER  NAUSEA AND VOMITING THAT IS NOT CONTROLLED WITH YOUR NAUSEA MEDICATION  *UNUSUAL SHORTNESS OF BREATH  *UNUSUAL BRUISING OR BLEEDING  TENDERNESS IN MOUTH AND THROAT WITH OR WITHOUT PRESENCE OF ULCERS  *URINARY PROBLEMS  *BOWEL PROBLEMS  UNUSUAL RASH Items with * indicate a potential emergency and should be followed up as soon as possible.  Feel free to call the clinic should you have any questions or concerns. The clinic phone number is [phone].  Please show the [place] at check-in to the Emergency Department and triage nurse.   Fluorouracil, 5-FU injection What is this medicine? FLUOROURACIL, 5-FU (flure oh YOOR a sil) is a chemotherapy drug. It slows the growth of cancer cells. This medicine is used to treat many types of cancer like breast cancer, colon or rectal cancer, pancreatic cancer, and stomach cancer. This medicine may be used for other purposes; ask your health care provider or pharmacist if you have questions. COMMON BRAND NAME(S): Adrucil What should I tell my health care provider before I take this medicine? They need to know if you have any of these conditions: -blood disorders -dihydropyrimidine dehydrogenase (DPD) deficiency -infection (especially a virus infection such as chickenpox, cold sores, or herpes) -kidney disease -liver disease -malnourished, poor nutrition -recent or ongoing radiation therapy -an unusual or allergic reaction to  fluorouracil, other chemotherapy, other medicines, foods, dyes, or preservatives -pregnant or trying to get pregnant -breast-feeding How should I use this medicine? This drug is given as an infusion or injection into a vein. It is administered in a hospital or clinic by a specially trained health care professional. Talk to your pediatrician regarding the use of this medicine in children. Special care may be needed. Overdosage: If you think you have taken too much of this medicine contact a poison control center or emergency room at once. NOTE: This medicine is only for you. Do not share this medicine with others. What if I miss a dose? It is important not to miss your dose. Call your doctor or health care professional if you are unable to keep an appointment. What may interact with this medicine? -allopurinol -cimetidine -dapsone -digoxin -hydroxyurea -leucovorin -levamisole -medicines for seizures like ethotoin, fosphenytoin, phenytoin -medicines to increase blood counts like filgrastim, pegfilgrastim, sargramostim -medicines that treat or prevent blood clots like warfarin, enoxaparin, and dalteparin -methotrexate -metronidazole -pyrimethamine -some other chemotherapy drugs like busulfan, cisplatin, estramustine, vinblastine -trimethoprim -trimetrexate -vaccines Talk to your doctor or health care professional before taking any of these medicines: -acetaminophen -aspirin -ibuprofen -ketoprofen -naproxen This list may not describe all possible interactions. Give your health care provider a list of all the medicines, herbs, non-prescription drugs, or dietary supplements you use. Also tell them if you smoke, drink alcohol, or use illegal drugs. Some items may interact with your medicine. What should I watch for while using this medicine? Visit your doctor for checks on your progress. This drug may make you feel generally unwell. This is not uncommon, as chemotherapy can affect  healthy cells  as well as cancer cells. Report any side effects. Continue your course of treatment even though you feel ill unless your doctor tells you to stop. In some cases, you may be given additional medicines to help with side effects. Follow all directions for their use. Call your doctor or health care professional for advice if you get a fever, chills or sore throat, or other symptoms of a cold or flu. Do not treat yourself. This drug decreases your body's ability to fight infections. Try to avoid being around people who are sick. This medicine may increase your risk to bruise or bleed. Call your doctor or health care professional if you notice any unusual bleeding. Be careful brushing and flossing your teeth or using a toothpick because you may get an infection or bleed more easily. If you have any dental work done, tell your dentist you are receiving this medicine. Avoid taking products that contain aspirin, acetaminophen, ibuprofen, naproxen, or ketoprofen unless instructed by your doctor. These medicines may hide a fever. Do not become pregnant while taking this medicine. Women should inform their doctor if they wish to become pregnant or think they might be pregnant. There is a potential for serious side effects to an unborn child. Talk to your health care professional or pharmacist for more information. Do not breast-feed an infant while taking this medicine. Men should inform their doctor if they wish to father a child. This medicine may lower sperm counts. Do not treat diarrhea with over the counter products. Contact your doctor if you have diarrhea that lasts more than 2 days or if it is severe and watery. This medicine can make you more sensitive to the sun. Keep out of the sun. If you cannot avoid being in the sun, wear protective clothing and use sunscreen. Do not use sun lamps or tanning beds/booths. What side effects may I notice from receiving this medicine? Side effects that you  should report to your doctor or health care professional as soon as possible: -allergic reactions like skin rash, itching or hives, swelling of the face, lips, or tongue -low blood counts - this medicine may decrease the number of white blood cells, red blood cells and platelets. You may be at increased risk for infections and bleeding. -signs of infection - fever or chills, cough, sore throat, pain or difficulty passing urine -signs of decreased platelets or bleeding - bruising, pinpoint red spots on the skin, black, tarry stools, blood in the urine -signs of decreased red blood cells - unusually weak or tired, fainting spells, lightheadedness -breathing problems -changes in vision -chest pain -mouth sores -nausea and vomiting -pain, swelling, redness at site where injected -pain, tingling, numbness in the hands or feet -redness, swelling, or sores on hands or feet -stomach pain -unusual bleeding Side effects that usually do not require medical attention (report to your doctor or health care professional if they continue or are bothersome): -changes in finger or toe nails -diarrhea -dry or itchy skin -hair loss -headache -loss of appetite -sensitivity of eyes to the light -stomach upset -unusually teary eyes This list may not describe all possible side effects. Call your doctor for medical advice about side effects. You may report side effects to FDA at 1-800-FDA-1088. Where should I keep my medicine? This drug is given in a hospital or clinic and will not be stored at home. NOTE: This sheet is a summary. It may not cover all possible information. If you have questions about this medicine, talk to your  doctor, pharmacist, or health care provider.  2018 Elsevier/Gold Standard (2007-08-08 13:53:16)

## 2017-04-03 NOTE — Progress Notes (Signed)
[facility] Spiritual Care Note  Followed up with Jacob Galvan and met his mom Jacob Galvan, introducing [employer] programming resources for her as well. Per pt, he is struggling with pain (including pacing and insomnia) and having trouble eating due to constipation pain and fear of pain/discomfort with bowel movements. Both Jacob Galvan and Jacob Galvan verbalized appreciation for team and care at [facility]; they are hopeful that chemo and xrt will help manage pain for better QOL, and they know to contact team with any questions or needs that arise.   [place], [place], [facility] Pager [phone] Voicemail [phone]

## 2017-04-03 NOTE — Progress Notes (Signed)
  Oncology Nurse Navigator Documentation  Navigator Location: [facility] (04/03/17 1638)   )                    Treatment Initiated Date: 04/03/17 (04/03/17 1638)   Treatment Phase: First Chemo Tx (04/03/17 1638)   Education: Pain/ Symptom Management (04/03/17 1638) Interventions: Psycho-social support (04/03/17 1638)  Met with patient and mother in the infusion room prior to start of first chemo treatment. Patient in considerable pain and unable to talk. Patient and mom encouraged to call me with questions or concerns. No navigation barriers identified today.          Acuity: Level 2 (04/03/17 1638)   Acuity Level 2: Ongoing guidance and education throughout treatment as needed (04/03/17 1638)     Time Spent with Patient: 15 (04/03/17 1638)

## 2017-04-03 NOTE — Progress Notes (Signed)
Per Dr. Benay Spice ok to use labs from 03/24/2017 for today's treatment.

## 2017-04-04 ENCOUNTER — Ambulatory Visit
Admission: RE | Admit: 2017-04-04 | Discharge: 2017-04-04 | Disposition: A | Discharge status: Home / Self Care | Payer: BLUE CROSS/BLUE SHIELD | Source: Ambulatory Visit | Attending: Radiation Oncology | Admitting: Radiation Oncology

## 2017-04-04 DIAGNOSIS — Z510 Encounter for antineoplastic radiation therapy: Secondary | ICD-10-CM | POA: Diagnosis not present

## 2017-04-05 ENCOUNTER — Ambulatory Visit
Admission: RE | Admit: 2017-04-05 | Discharge: 2017-04-05 | Disposition: A | Discharge status: Home / Self Care | Payer: BLUE CROSS/BLUE SHIELD | Source: Ambulatory Visit | Attending: Radiation Oncology | Admitting: Radiation Oncology

## 2017-04-05 DIAGNOSIS — Z510 Encounter for antineoplastic radiation therapy: Secondary | ICD-10-CM | POA: Diagnosis not present

## 2017-04-05 DIAGNOSIS — C20 Malignant neoplasm of rectum: Secondary | ICD-10-CM

## 2017-04-05 NOTE — Progress Notes (Signed)
Pt education done, my business card, sitz bath ,radiation therapy and you book given, ,discussed ways to manage side effects, skin irritation,pain, fatigue, n,v,d, rectal discomfort,urinary burning,frequency, urgency, loss pubic hair, may need to eat 5-6 smaller meals, softer foods, increase protein in diet, buy baby wipes non alcohol, tucks, witch hazel to use prn, sitz  bath prn, imodium prn for diarrhea, low fivber diet, if having diarrhe,no fresh vegs,fruit,or milk, sees MD weekly and prn, spray bottom with warm water if irritated, baby wipes,pat no rubbing scrubbing  Area,drink plenty water stay hydrated, fatigue, get plenty sleep,rest and do some form of exercise daily, teach back 1:48 PM

## 2017-04-06 ENCOUNTER — Ambulatory Visit
Admission: RE | Admit: 2017-04-06 | Discharge: 2017-04-06 | Disposition: A | Discharge status: Home / Self Care | Payer: BLUE CROSS/BLUE SHIELD | Source: Ambulatory Visit | Attending: Radiation Oncology | Admitting: Radiation Oncology

## 2017-04-06 DIAGNOSIS — Z510 Encounter for antineoplastic radiation therapy: Secondary | ICD-10-CM | POA: Diagnosis not present

## 2017-04-07 ENCOUNTER — Ambulatory Visit
Admission: RE | Admit: 2017-04-07 | Discharge: 2017-04-07 | Disposition: A | Discharge status: Home / Self Care | Payer: BLUE CROSS/BLUE SHIELD | Source: Ambulatory Visit | Attending: Radiation Oncology | Admitting: Radiation Oncology

## 2017-04-07 DIAGNOSIS — Z510 Encounter for antineoplastic radiation therapy: Secondary | ICD-10-CM | POA: Diagnosis not present

## 2017-04-09 ENCOUNTER — Other Ambulatory Visit: Payer: Self-pay | Admitting: Oncology

## 2017-04-10 ENCOUNTER — Telehealth: Payer: Self-pay | Admitting: *Deleted

## 2017-04-10 ENCOUNTER — Ambulatory Visit
Admission: RE | Admit: 2017-04-10 | Discharge: 2017-04-10 | Disposition: A | Discharge status: Home / Self Care | Payer: BLUE CROSS/BLUE SHIELD | Source: Ambulatory Visit | Attending: Radiation Oncology | Admitting: Radiation Oncology

## 2017-04-10 ENCOUNTER — Ambulatory Visit (HOSPITAL_BASED_OUTPATIENT_CLINIC_OR_DEPARTMENT_OTHER): Payer: BLUE CROSS/BLUE SHIELD

## 2017-04-10 ENCOUNTER — Other Ambulatory Visit: Payer: BLUE CROSS/BLUE SHIELD

## 2017-04-10 VITALS — BP 106/76 | HR 81 | Temp 99.0°F | Resp 18 | Wt 143.5 lb

## 2017-04-10 DIAGNOSIS — C20 Malignant neoplasm of rectum: Secondary | ICD-10-CM | POA: Diagnosis not present

## 2017-04-10 DIAGNOSIS — Z5111 Encounter for antineoplastic chemotherapy: Secondary | ICD-10-CM | POA: Diagnosis not present

## 2017-04-10 DIAGNOSIS — Z510 Encounter for antineoplastic radiation therapy: Secondary | ICD-10-CM | POA: Diagnosis not present

## 2017-04-10 MED ORDER — OXYCODONE HCL 5 MG PO TABS: 5.0000 mg | ORAL_TABLET | ORAL | 0 refills | Status: DC | PRN

## 2017-04-10 MED ORDER — SODIUM CHLORIDE 0.9 % IV SOLN
225.0000 mg/m2/d | INTRAVENOUS | Status: DC
  Administered 2017-04-10: 2900 mg via INTRAVENOUS
  Filled 2017-04-10: qty 58

## 2017-04-10 NOTE — Progress Notes (Signed)
Per Lavella Lemons, desk RN, no labs needed for treatment.   Wylene Simmer, BSN, RN 04/10/2017 3:47 PM

## 2017-04-10 NOTE — Patient Instructions (Signed)
[facility] Discharge Instructions for Patients Receiving Chemotherapy  Today you received the following chemotherapy agents Adrucil  To help prevent nausea and vomiting after your treatment, we encourage you to take your nausea medication as prescribed by MD.    If you develop nausea and vomiting that is not controlled by your nausea medication, call the clinic.   BELOW ARE SYMPTOMS THAT SHOULD BE REPORTED IMMEDIATELY:  *FEVER GREATER THAN 100.5 F  *CHILLS WITH OR WITHOUT FEVER  NAUSEA AND VOMITING THAT IS NOT CONTROLLED WITH YOUR NAUSEA MEDICATION  *UNUSUAL SHORTNESS OF BREATH  *UNUSUAL BRUISING OR BLEEDING  TENDERNESS IN MOUTH AND THROAT WITH OR WITHOUT PRESENCE OF ULCERS  *URINARY PROBLEMS  *BOWEL PROBLEMS  UNUSUAL RASH Items with * indicate a potential emergency and should be followed up as soon as possible.  Feel free to call the clinic should you have any questions or concerns. The clinic phone number is [phone].  Please show the [place] at check-in to the Emergency Department and triage nurse.

## 2017-04-10 NOTE — Telephone Encounter (Signed)
Called pt to follow up after missed infusion appt. He reports he does not have transportation. Pt stated he wasn't aware he had to pay for SCAT and "things are tight this week."  Encouraged him to try to come in today as his port needle will need to be changed and we need to keep him on schedule with treatment.  Pt stated he will do his best.

## 2017-04-12 ENCOUNTER — Ambulatory Visit
Admission: RE | Admit: 2017-04-12 | Discharge: 2017-04-12 | Disposition: A | Discharge status: Home / Self Care | Payer: BLUE CROSS/BLUE SHIELD | Source: Ambulatory Visit | Attending: Radiation Oncology | Admitting: Radiation Oncology

## 2017-04-12 ENCOUNTER — Telehealth: Payer: Self-pay | Admitting: *Deleted

## 2017-04-12 ENCOUNTER — Other Ambulatory Visit: Payer: Self-pay | Admitting: Oncology

## 2017-04-12 ENCOUNTER — Telehealth: Payer: Self-pay | Admitting: Oncology

## 2017-04-12 ENCOUNTER — Other Ambulatory Visit (HOSPITAL_BASED_OUTPATIENT_CLINIC_OR_DEPARTMENT_OTHER): Payer: BLUE CROSS/BLUE SHIELD

## 2017-04-12 ENCOUNTER — Encounter: Payer: Self-pay | Admitting: General Practice

## 2017-04-12 DIAGNOSIS — Z510 Encounter for antineoplastic radiation therapy: Secondary | ICD-10-CM | POA: Diagnosis not present

## 2017-04-12 DIAGNOSIS — C20 Malignant neoplasm of rectum: Secondary | ICD-10-CM

## 2017-04-12 LAB — COMPREHENSIVE METABOLIC PANEL
ALT: 15 U/L (ref 0–55)
ANION GAP: 6 meq/L (ref 3–11)
AST: 18 U/L (ref 5–34)
Albumin: 3.3 g/dL — ABNORMAL LOW (ref 3.5–5.0)
Alkaline Phosphatase: 69 U/L (ref 40–150)
BUN: 12.2 mg/dL (ref 7.0–26.0)
CALCIUM: 8.1 mg/dL — AB (ref 8.4–10.4)
CHLORIDE: 107 meq/L (ref 98–109)
CO2: 25 meq/L (ref 22–29)
CREATININE: 0.8 mg/dL (ref 0.7–1.3)
Glucose: 102 mg/dl (ref 70–140)
Potassium: 4.1 mEq/L (ref 3.5–5.1)
Sodium: 139 mEq/L (ref 136–145)
Total Bilirubin: 0.25 mg/dL (ref 0.20–1.20)
Total Protein: 5.9 g/dL — ABNORMAL LOW (ref 6.4–8.3)

## 2017-04-12 LAB — CBC WITH DIFFERENTIAL/PLATELET
BASO%: 0.6 % (ref 0.0–2.0)
BASOS ABS: 0 10*3/uL (ref 0.0–0.1)
EOS ABS: 0.4 10*3/uL (ref 0.0–0.5)
EOS%: 7.9 % — ABNORMAL HIGH (ref 0.0–7.0)
HEMATOCRIT: 32.5 % — AB (ref 38.4–49.9)
HGB: 10.8 g/dL — ABNORMAL LOW (ref 13.0–17.1)
LYMPH#: 1 10*3/uL (ref 0.9–3.3)
LYMPH%: 19.8 % (ref 14.0–49.0)
MCH: 30.7 pg (ref 27.2–33.4)
MCHC: 33.2 g/dL (ref 32.0–36.0)
MCV: 92.3 fL (ref 79.3–98.0)
MONO#: 0.6 10*3/uL (ref 0.1–0.9)
MONO%: 11.3 % (ref 0.0–14.0)
NEUT#: 3 10*3/uL (ref 1.5–6.5)
NEUT%: 60.4 % (ref 39.0–75.0)
PLATELETS: 256 10*3/uL (ref 140–400)
RBC: 3.52 10*6/uL — AB (ref 4.20–5.82)
RDW: 13.4 % (ref 11.0–14.6)
WBC: 5 10*3/uL (ref 4.0–10.3)

## 2017-04-12 MED FILL — oxyCODONE HCL 5 MG TABS: 5 | 8 days supply | Qty: 50 | Fill #0

## 2017-04-12 NOTE — Telephone Encounter (Signed)
CALLED PATIENT TO INFORM OF PET SCAN FOR 05-01-17 - ARRIVAL TIME - 7:30 AM @ [facility], PT. TO BE NPO- AFTER MIDNIGHT, SPOKE WITH PATIENT AND HE IS AWARE OF THIS TEST

## 2017-04-12 NOTE — Progress Notes (Signed)
[facility] CSW Progress Note  CSW reviewed chart, noted patient has difficulty affording [place] bus fare to access treatment.  Spoke w Burnetta Sabin, financial advocate, who will talk w patient at this treatment today re available sources of financial support.  CSW will also give SCAT bus pass to [place] to provide to patient to assist w meeting transportation need.  CSW spoke w patient, confirmed that he is coming to treatment today.  Pt advised to ask to speak w Financial Advocate prior to leaving after appointment, was also advised to bring proof of income so advocate can discuss any available options for patient assistance.  Edwyna Shell, LCSW Clinical Social Worker Phone:  [phone]

## 2017-04-12 NOTE — Telephone Encounter (Signed)
Scheduled appt per 12/24 sch msg - spoke with patient regarding appts.

## 2017-04-13 ENCOUNTER — Telehealth: Payer: Self-pay | Admitting: *Deleted

## 2017-04-13 ENCOUNTER — Ambulatory Visit
Admission: RE | Admit: 2017-04-13 | Discharge: 2017-04-13 | Disposition: A | Discharge status: Home / Self Care | Payer: BLUE CROSS/BLUE SHIELD | Source: Ambulatory Visit | Attending: Radiation Oncology | Admitting: Radiation Oncology

## 2017-04-13 DIAGNOSIS — Z510 Encounter for antineoplastic radiation therapy: Secondary | ICD-10-CM | POA: Diagnosis not present

## 2017-04-13 NOTE — Telephone Encounter (Signed)
Called patient to inform of Pet Scan being moved to 04-20-17 - arrival time - 12 pm, pt. to be NPO- 6 hrs. Prior to test, test to be @ [facility], spoke with patient and he is aware of this test change.

## 2017-04-14 ENCOUNTER — Ambulatory Visit
Admission: RE | Admit: 2017-04-14 | Discharge: 2017-04-14 | Disposition: A | Discharge status: Home / Self Care | Payer: BLUE CROSS/BLUE SHIELD | Source: Ambulatory Visit | Attending: Radiation Oncology | Admitting: Radiation Oncology

## 2017-04-14 DIAGNOSIS — Z510 Encounter for antineoplastic radiation therapy: Secondary | ICD-10-CM | POA: Diagnosis not present

## 2017-04-15 ENCOUNTER — Emergency Department (HOSPITAL_COMMUNITY)
Admission: EM | Admit: 2017-04-15 | Discharge: 2017-04-15 | Disposition: A | Discharge status: Home / Self Care | Payer: BLUE CROSS/BLUE SHIELD | Attending: Emergency Medicine | Admitting: Emergency Medicine

## 2017-04-15 DIAGNOSIS — R2243 Localized swelling, mass and lump, lower limb, bilateral: Secondary | ICD-10-CM | POA: Insufficient documentation

## 2017-04-15 DIAGNOSIS — Z85048 Personal history of other malignant neoplasm of rectum, rectosigmoid junction, and anus: Secondary | ICD-10-CM | POA: Insufficient documentation

## 2017-04-15 DIAGNOSIS — Z85038 Personal history of other malignant neoplasm of large intestine: Secondary | ICD-10-CM | POA: Insufficient documentation

## 2017-04-15 DIAGNOSIS — R609 Edema, unspecified: Secondary | ICD-10-CM

## 2017-04-15 DIAGNOSIS — Z79899 Other long term (current) drug therapy: Secondary | ICD-10-CM | POA: Diagnosis not present

## 2017-04-15 DIAGNOSIS — F17200 Nicotine dependence, unspecified, uncomplicated: Secondary | ICD-10-CM | POA: Diagnosis not present

## 2017-04-15 DIAGNOSIS — M79604 Pain in right leg: Secondary | ICD-10-CM | POA: Diagnosis present

## 2017-04-15 LAB — URINALYSIS, ROUTINE W REFLEX MICROSCOPIC
Bilirubin Urine: NEGATIVE
Glucose, UA: NEGATIVE mg/dL
Hgb urine dipstick: NEGATIVE
Ketones, ur: NEGATIVE mg/dL
LEUKOCYTES UA: NEGATIVE
NITRITE: NEGATIVE
PH: 7 (ref 5.0–8.0)
Protein, ur: NEGATIVE mg/dL
SPECIFIC GRAVITY, URINE: 1.01 (ref 1.005–1.030)

## 2017-04-15 LAB — CBC WITH DIFFERENTIAL/PLATELET
BASOS ABS: 0 10*3/uL (ref 0.0–0.1)
Basophils Relative: 0 %
Eosinophils Absolute: 0.3 10*3/uL (ref 0.0–0.7)
Eosinophils Relative: 5 %
HCT: 30.9 % — ABNORMAL LOW (ref 39.0–52.0)
HEMOGLOBIN: 10.4 g/dL — AB (ref 13.0–17.0)
LYMPHS ABS: 0.8 10*3/uL (ref 0.7–4.0)
LYMPHS PCT: 15 %
MCH: 31.2 pg (ref 26.0–34.0)
MCHC: 33.7 g/dL (ref 30.0–36.0)
MCV: 92.8 fL (ref 78.0–100.0)
Monocytes Absolute: 0.6 10*3/uL (ref 0.1–1.0)
Monocytes Relative: 11 %
NEUTROS ABS: 3.8 10*3/uL (ref 1.7–7.7)
NEUTROS PCT: 69 %
PLATELETS: 240 10*3/uL (ref 150–400)
RBC: 3.33 MIL/uL — AB (ref 4.22–5.81)
RDW: 14.1 % (ref 11.5–15.5)
WBC: 5.5 10*3/uL (ref 4.0–10.5)

## 2017-04-15 LAB — COMPREHENSIVE METABOLIC PANEL
ALBUMIN: 3.2 g/dL — AB (ref 3.5–5.0)
ALT: 22 U/L (ref 17–63)
ANION GAP: 4 — AB (ref 5–15)
AST: 28 U/L (ref 15–41)
Alkaline Phosphatase: 45 U/L (ref 38–126)
BUN: 8 mg/dL (ref 6–20)
CHLORIDE: 106 mmol/L (ref 101–111)
CO2: 29 mmol/L (ref 22–32)
Calcium: 8.4 mg/dL — ABNORMAL LOW (ref 8.9–10.3)
Creatinine, Ser: 0.7 mg/dL (ref 0.61–1.24)
GFR calc non Af Amer: >60 mL/min (ref >60)
GLUCOSE: 109 mg/dL — AB (ref 65–99)
Potassium: 3.9 mmol/L (ref 3.5–5.1)
SODIUM: 139 mmol/L (ref 135–145)
Total Bilirubin: 0.7 mg/dL (ref 0.3–1.2)
Total Protein: 5.8 g/dL — ABNORMAL LOW (ref 6.5–8.1)

## 2017-04-15 NOTE — Discharge Instructions (Signed)
Follow up with your Physician for recheck next week.   Elevate feet

## 2017-04-15 NOTE — ED Provider Notes (Signed)
[facility] DEPT Provider Note   CSN: [id number] Arrival date & time: 04/15/17  1433     History   Chief Complaint Chief Complaint  Patient presents with  . Pruritis  . Foot Swelling  . Cancer Pt    HPI Jacob Galvan. is a 41 y.o. male.  The history is provided by the patient. No language interpreter was used.  Leg Pain   This is a new problem. The current episode started 2 days ago. The problem occurs constantly. The problem has been gradually worsening. The pain is present in the right lower leg and left lower leg. The quality of the pain is described as aching. Pertinent negatives include no numbness. He has tried nothing for the symptoms. The treatment provided no relief. There has been no history of extremity trauma.  Pt reports he is having some swelling in his lower legs.  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  Pt feels like his cancer is shrinking because he is able to have bowel movements now.  Pt reports he has increased fatigue and has noticed some swelling around his ankles.    Past Medical History:  Diagnosis Date  . Colon cancer ([place])   . Rectal cancer [facility]) 2018    Patient Active Problem List   Diagnosis Date Noted  . Family history of colon cancer 03/29/2017  . Family history of melanoma 03/29/2017  . Family history of pancreatic cancer 03/29/2017  . Family history of testicular cancer 03/29/2017  . Rectal cancer ([place]) 03/17/2017    Past Surgical History:  Procedure Laterality Date  . COLONOSCOPY    . PORTA CATH INSERTION         Home Medications    Prior to Admission medications   Medication Sig Start Date End Date Taking? Authorizing Provider  diphenhydramine-acetaminophen (TYLENOL PM) 25-500 MG TABS tablet Take 2 tablets by mouth at bedtime as needed (pain, sleep).   Yes [provider]  docusate sodium (COLACE) 50 MG capsule Take 1 capsule (50 mg total) by mouth 2 (two)  times daily. 03/29/17  Yes Owens Shark, NP  HYDROcodone-acetaminophen (NORCO/VICODIN) 5-325 MG tablet Take 1 tablet by mouth every 6 (six) hours as needed for moderate pain. 03/24/17  Yes Ladell Pier, MD  oxyCODONE (OXY IR/ROXICODONE) 5 MG immediate release tablet Take 1 tablet (5 mg total) by mouth every 4 (four) hours as needed for severe pain. 04/10/17  Yes Ladell Pier, MD  polyethylene glycol (GOLYTELY) 236 g solution 2 L by mouth over 4 hours 03/04/17  Yes Pfeiffer, Jeannie Done, MD  sorbitol 70 % solution Take 30 mLs by mouth 2 (two) times daily as needed. 03/17/17  Yes Ladell Pier, MD  traMADol (ULTRAM) 50 MG tablet Take 2 tablets (100 mg total) every 6 (six) hours as needed by mouth. 03/04/17  Yes Charlesetta Shanks, MD    Family History Family History  Problem Relation Age of Onset  . Diabetes Other   . Diabetes Maternal Grandmother   . Colon cancer Paternal Grandfather   . Pancreatic cancer Paternal Grandfather 61  . Breast cancer Paternal Aunt 37  . Melanoma Sister 58       x2  . Testicular cancer Maternal Grandfather 44  . COPD Paternal Grandmother   . Colon cancer Paternal Uncle 15  . Pancreatic cancer Paternal Uncle   . Testicular cancer Other   . Heart Problems Son 5    Social History  Social History   Tobacco Use  . Smoking status: Current Some Day Smoker  . Smokeless tobacco: Never Used  Substance Use Topics  . Alcohol use: Yes    Comment: occ  . Drug use: Yes    Types: Marijuana    Comment: daily     Allergies   Patient has no known allergies.   Review of Systems Review of Systems  Neurological: Negative for numbness.  All other systems reviewed and are negative.    Physical Exam Updated Vital Signs BP 107/71 (BP Location: Left Arm)   Pulse 66   Temp 98.4 F (36.9 C) (Oral)   Resp 18   Ht 6\' 2"  (1.88 m)   Wt 69.9 kg (154 lb)   SpO2 100%   BMI 19.77 kg/m   Physical Exam  Constitutional: He appears well-developed and  well-nourished.  HENT:  Head: Normocephalic.  Right Ear: External ear normal.  Left Ear: External ear normal.  Mouth/Throat: Oropharynx is clear and moist.  Eyes: Pupils are equal, round, and reactive to light.  Neck: Normal range of motion.  Cardiovascular: Normal rate.  Pulmonary/Chest: Effort normal.  Abdominal: Soft.  Musculoskeletal: Normal range of motion.  Neurological: He is alert.  Skin: Skin is warm.  Psychiatric: He has a normal mood and affect.  Nursing note and vitals reviewed.    ED Treatments / Results  Labs (all labs ordered are listed, but only abnormal results are displayed) Labs Reviewed  CBC WITH DIFFERENTIAL/PLATELET - Abnormal; Notable for the following components:      Result Value   RBC 3.33 (*)    Hemoglobin 10.4 (*)    HCT 30.9 (*)    All other components within normal limits  COMPREHENSIVE METABOLIC PANEL - Abnormal; Notable for the following components:   Glucose, Bld 109 (*)    Calcium 8.4 (*)    Total Protein 5.8 (*)    Albumin 3.2 (*)    Anion gap 4 (*)    All other components within normal limits  URINALYSIS, ROUTINE W REFLEX MICROSCOPIC    EKG  EKG Interpretation None     I suspect pt's symptoms are second to radiation.  Pt has minimal ankle swelling.  Homan's is negative bilat.   I doubt pe.   I advised pt to followup with his MD for rehceck  Radiology No results found.  Procedures Procedures (including critical care time)  Medications Ordered in ED Medications - No data to display   Initial Impression / Assessment and Plan / ED Course  I have reviewed the triage vital signs and the nursing notes.  Pertinent labs & imaging results that were available during my care of the patient were reviewed by me and considered in my medical decision making (see chart for details).       Final Clinical Impressions(s) / ED Diagnoses   Final diagnoses:  Peripheral edema    ED Discharge Orders    None    An After Visit Summary  was printed and given to the patient.    Fransico Meadow, PA-C 04/15/17 1807    Sherwood Gambler, MD 04/16/17 [phone]

## 2017-04-15 NOTE — ED Triage Notes (Signed)
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  Pt's left leg/foot are swollen. No pitting edema noted.

## 2017-04-15 NOTE — ED Notes (Signed)
ED Provider at bedside. 

## 2017-04-17 ENCOUNTER — Other Ambulatory Visit: Payer: BLUE CROSS/BLUE SHIELD

## 2017-04-17 ENCOUNTER — Ambulatory Visit
Admission: RE | Admit: 2017-04-17 | Discharge: 2017-04-17 | Disposition: A | Discharge status: Home / Self Care | Payer: BLUE CROSS/BLUE SHIELD | Source: Ambulatory Visit | Attending: Radiation Oncology | Admitting: Radiation Oncology

## 2017-04-17 ENCOUNTER — Telehealth: Payer: Self-pay | Admitting: Genetics

## 2017-04-17 ENCOUNTER — Encounter: Payer: Self-pay | Admitting: Genetics

## 2017-04-17 ENCOUNTER — Encounter: Payer: Self-pay | Admitting: Nurse Practitioner

## 2017-04-17 ENCOUNTER — Ambulatory Visit (HOSPITAL_BASED_OUTPATIENT_CLINIC_OR_DEPARTMENT_OTHER): Payer: BLUE CROSS/BLUE SHIELD

## 2017-04-17 ENCOUNTER — Ambulatory Visit: Payer: BLUE CROSS/BLUE SHIELD

## 2017-04-17 ENCOUNTER — Ambulatory Visit (HOSPITAL_BASED_OUTPATIENT_CLINIC_OR_DEPARTMENT_OTHER): Payer: BLUE CROSS/BLUE SHIELD | Admitting: Nurse Practitioner

## 2017-04-17 ENCOUNTER — Ambulatory Visit: Payer: Self-pay | Admitting: Genetics

## 2017-04-17 VITALS — BP 116/64 | HR 74 | Temp 98.7°F | Resp 17 | Ht 74.0 in | Wt 145.1 lb

## 2017-04-17 DIAGNOSIS — C20 Malignant neoplasm of rectum: Secondary | ICD-10-CM | POA: Diagnosis not present

## 2017-04-17 DIAGNOSIS — R350 Frequency of micturition: Secondary | ICD-10-CM | POA: Diagnosis not present

## 2017-04-17 DIAGNOSIS — Z8043 Family history of malignant neoplasm of testis: Secondary | ICD-10-CM

## 2017-04-17 DIAGNOSIS — Z510 Encounter for antineoplastic radiation therapy: Secondary | ICD-10-CM | POA: Diagnosis not present

## 2017-04-17 DIAGNOSIS — Z5111 Encounter for antineoplastic chemotherapy: Secondary | ICD-10-CM | POA: Diagnosis not present

## 2017-04-17 DIAGNOSIS — Z808 Family history of malignant neoplasm of other organs or systems: Secondary | ICD-10-CM

## 2017-04-17 DIAGNOSIS — Z1379 Encounter for other screening for genetic and chromosomal anomalies: Secondary | ICD-10-CM

## 2017-04-17 DIAGNOSIS — G893 Neoplasm related pain (acute) (chronic): Secondary | ICD-10-CM | POA: Diagnosis not present

## 2017-04-17 DIAGNOSIS — Z800 Family history of malignant neoplasm of digestive organs: Secondary | ICD-10-CM

## 2017-04-17 DIAGNOSIS — Z95828 Presence of other vascular implants and grafts: Secondary | ICD-10-CM | POA: Insufficient documentation

## 2017-04-17 MED ORDER — SODIUM CHLORIDE 0.9% FLUSH
10.0000 mL | INTRAVENOUS | Status: DC | PRN
  Administered 2017-04-17: 10 mL via INTRAVENOUS
  Filled 2017-04-17: qty 10

## 2017-04-17 MED ORDER — SODIUM CHLORIDE 0.9 % IV SOLN: Freq: Once | INTRAVENOUS | Status: DC

## 2017-04-17 MED ORDER — FLUOROURACIL CHEMO INJECTION 5 GM/100ML
225.0000 mg/m2/d | INTRAVENOUS | Status: DC
  Administered 2017-04-17: 2900 mg via INTRAVENOUS
  Filled 2017-04-17: qty 58

## 2017-04-17 NOTE — Patient Instructions (Signed)
[facility] Discharge Instructions for Patients Receiving Chemotherapy  Today you received the following chemotherapy agents Adrucil  To help prevent nausea and vomiting after your treatment, we encourage you to take your nausea medication as prescribed by MD.    If you develop nausea and vomiting that is not controlled by your nausea medication, call the clinic.   BELOW ARE SYMPTOMS THAT SHOULD BE REPORTED IMMEDIATELY:  *FEVER GREATER THAN 100.5 F  *CHILLS WITH OR WITHOUT FEVER  NAUSEA AND VOMITING THAT IS NOT CONTROLLED WITH YOUR NAUSEA MEDICATION  *UNUSUAL SHORTNESS OF BREATH  *UNUSUAL BRUISING OR BLEEDING  TENDERNESS IN MOUTH AND THROAT WITH OR WITHOUT PRESENCE OF ULCERS  *URINARY PROBLEMS  *BOWEL PROBLEMS  UNUSUAL RASH Items with * indicate a potential emergency and should be followed up as soon as possible.  Feel free to call the clinic should you have any questions or concerns. The clinic phone number is [phone].  Please show the [place] at check-in to the Emergency Department and triage nurse.

## 2017-04-17 NOTE — Telephone Encounter (Signed)
Revealed negative genetic testing.  Revealed that 2  VUS's in ATM were identified.    We discussed that we do not know why he has colon/rectal cancer or why there is cancer in the family. It could be due to a different gene that we are not testing, or maybe our current technology may not be able to pick something up.  It will be important for him to keep in contact with genetics to learn if additional testing may be needed in the future.  We recommended that his children and have colonoscopies starting at age 65.    We also recommended that due to the paternal history of colon and pancreatic cancer, paternal relatives are recommended to have genetic counseling and genetic testing.

## 2017-04-17 NOTE — Progress Notes (Signed)
HPI: Jacob Galvan was previously seen in the [place] clinic on 03/29/2017 due to a personal history of rectal cancer, family history of cancer, and concerns regarding a hereditary predisposition to cancer. Please refer to our prior cancer genetics clinic note for more information regarding Jacob Galvan medical, social and family histories, and our assessment and recommendations, at the time. Jacob Galvan recent genetic test results were disclosed to him, as well as recommendations warranted by these results. These results and recommendations are discussed in more detail below.  CANCER HISTORY:    Rectal cancer ([place])   03/17/2017 Initial Diagnosis    Rectal cancer ([place])      04/07/2017 Genetic Testing    The patient had genetic testing due to a personal history of rectal cancer and a family history of pancreatic, colon, melanoma, breast, and testicular cancer.  The Common Hereditary Cancer Panel + Melanoma Panel was ordered (53 genes).  The following genes were evaluated for sequence changes and exonic deletions/duplications: APC, ATM, AXIN2, BAP1, BARD1, BMPR1A, BRCA1, BRCA2, BRIP1, CDH1, CDK4, CDKN2A (p14ARF), CDKN2A (p16INK4a), CHEK2, CTNNA1, DICER1, EPCAM*, GREM1*, KIT, MC1R, MEN1, MLH1, MSH2, MSH3, MSH6, MUTYH, NBN, NF1, PALB2, PDGFRA, PMS2, POLD1, POLE, POT1, PTEN, RAD50, RAD51C, RAD51D, RB1, SDHB, SDHC, SDHD, SMAD4, SMARCA4, STK11, TERT, TP53, TSC1, TSC2, VHL. The following genes were evaluated for sequence changes only: HOXB13*, MITF*, NTHL1*, SDHA  Results: No pathogenic variants were identified.  2 variants of uncertain significance in the gene ATM were identified. c.2281A>T (p.Thr761Ser), c.4871A>G (p.His1624Arg).  The date of this test report is 04/07/2017.         FAMILY HISTORY:  We obtained a detailed, 4-generation family history.  Significant diagnoses are listed below: Family History  Problem Relation Age of Onset  . Diabetes Other   . Diabetes Maternal Grandmother    . Colon cancer Paternal Grandfather   . Pancreatic cancer Paternal Grandfather 35  . Breast cancer Paternal Aunt 64  . Melanoma Sister 35       x2  . Testicular cancer Maternal Grandfather 41  . COPD Paternal Grandmother   . Colon cancer Paternal Uncle 21  . Pancreatic cancer Paternal Uncle   . Testicular cancer Other   . Heart Problems Son 5   Jacob Galvan has 3 daughters (ages 21, 68, and 73) and 2 sons 62 ages 104 and 109) with no history of cancer.  He has 1 grandson who is healthy.   Jacob Galvan has a paternal half-sister who is 38 and has had 2 melanomas.  He has a maternal half-sister who is 96 with no history of cancer.  She has a 31 year-old daughter and 79 year-old son.    Jacob Galvan 's father is 44 with no history of cancer.  Jacob Galvan paternal uncle died of pancreatic cancer and colon cancer diagnosed in his 49's.  Jacob Galvan has 3 paternal aunts.  One was diagnosed with breast cancer in her 38's.  Jacob Galvan paternal grandfather had pancreatic cancer and colon cancer at 63 and his paternal grandmother is 13 and has COPD.    Jacob Galvan mother is 63 with no history of cancer. She has 4 siblings with no history of cancer.  Jacob Galvan reports that there is a significant history of cancer in his cousins on his mother's side of the family but does not know many details details or what types of cancer there are in the family.  He reports that his maternal grandfather had testicular cancer in  his 96's and his paternal grandmother had diabetes, but no history of cancer.   His maternal grandmother's brother had testicular cancer as well.    Jacob Galvan is unaware of previous family history of genetic testing for hereditary cancer risks. Patient's maternal ancestors are of Black/Caucaisan descent, and paternal ancestors are of Black descent. There is no reported Ashkenazi Jewish ancestry. There is no known consanguinity.  GENETIC TEST RESULTS: Genetic testing performed through [facility] Common Hereditary  Cancer Panel + Melanoma Panel reported out on 04/07/2017 showed no pathogenic mutations. The following genes were evaluated for sequence changes and exonic deletions/duplications: APC, ATM, AXIN2, BAP1, BARD1, BMPR1A, BRCA1, BRCA2, BRIP1, CDH1, CDK4, CDKN2A (p14ARF), CDKN2A (p16INK4a), CHEK2,CTNNA1, DICER1, EPCAM*, GREM1*, KIT, MC1R, MEN1, MLH1, MSH2, MSH3, MSH6, MUTYH, NBN, NF1, PALB2, PDGFRA, PMS2, POLD1, POLE, POT1, PTEN, RAD50, RAD51C, RAD51D, RB1, SDHB, SDHC, SDHD, SMAD4, SMARCA4, STK11, TERT, TP53, TSC1, TSC2, VHL. The following genes were evaluated for sequence changes only: HOXB13*, MITF*, NTHL1*, SDHA.  2 variants of uncertain significance (VUS) in the gene called ATM were also noted. c.2281A>T (p.Thr761Ser), c.4871A>G ([id number])  The test report will be scanned into EPIC and will be located under the Molecular Pathology section of the Results Review tab.A portion of the result report is included below for reference.     We discussed with Jacob Galvan that because current genetic testing is not perfect, it is possible there may be a gene mutation in one of these genes that current testing cannot detect, but that chance is small. We also discussed, that there could be another gene that has not yet been discovered, or that we have not yet tested, that is responsible for the cancer diagnoses in the family. It is also possible there is a hereditary cause for the cancer in the family that Jacob Galvan did not inherit and therefore was not identified in his testing.  Therefore, it is important to remain in touch with cancer genetics in the future so that we can continue to offer Jacob Galvan the most up to date genetic testing.   Regarding the VUS's in ATM: At this time, it is unknown if these variants are associated with increased cancer risk or if they are normal findings, but most variants such as these get reclassified to being inconsequential. They should not be used to make medical management  decisions. With time, we suspect the lab will determine the significance of these variants, if any. If we do learn more about them, we will try to contact Jacob Galvan to discuss it further. However, it is important to stay in touch with [place] periodically and keep the address and phone number up to date.  ADDITIONAL GENETIC TESTING: We discussed with Jacob Galvan that there are other genes that are associated with increased cancer risk that can be analyzed. The laboratories that offer this testing look at these additional genes via a hereditary cancer gene panel. Should Jacob Galvan wish to pursue additional genetic testing, we are happy to discuss and coordinate this testing, at any time.    CANCER SCREENING RECOMMENDATIONS:   Mr. Shere test result is considered negative (normal).  This means that we have not identified a hereditary cause for his personal and family history of cancer at this time.   While somewhat reassuring, given Mr. Smolinsky personal and family histories, we must consider these negative results carefully.  Families with features suggestive of hereditary risk for cancer tend to have multiple family members with cancer, diagnoses in multiple generations, and  diagnoses before the age of 4. Mr. Austad family exhibits some of these features. Therefore this negative result may actually be due to the limitations of current technology to detect all mutations within these genes, or there may be a different gene that has not yet been discovered or tested.   RECOMMENDATIONS FOR FAMILY MEMBERS: Relatives in this family are at some increased risk of developing cancer, over the general population risk, simply due to the family history of cancer. We recommended women in this family have a yearly mammogram beginning at age 59, or 59 years younger than the earliest onset of cancer, an annual clinical breast exam, and perform monthly breast self-exams. Women in this family should also have a gynecological exam as  recommended by their primary provider. All family members should have a colonoscopy by age 24, or sooner depending on family history.  We recommend his children begin colonoscopies starting by age 53.   His siblings and all other family members should inform their physicians about the family history of cancer so their doctors can make the most appropriate screening recommendations for them.  Based on Mr. Fenlon family history, we recommended his paternal realtatives have genetic counseling and testing. Mr. Reamy will let us know if we can be of any assistance in coordinating genetic counseling and/or testing for these family members.   FOLLOW-UP: Lastly, we discussed with Mr. Gervin that cancer genetics is a rapidly advancing field and it is possible that new genetic tests will be appropriate for him and/or his family members in the future. We encouraged him to remain in contact with cancer genetics on an annual basis so we can update his personal and family histories and let him know of advances in cancer genetics that may benefit this family.   Our contact number was provided. Mr. Churchill questions were answered to his satisfaction, and he knows he is welcome to call us at anytime with additional questions or concerns.   Ferol Luz, MS Genetic Counselor [email or web address]

## 2017-04-17 NOTE — Progress Notes (Signed)
  [facility] OFFICE PROGRESS NOTE   Diagnosis: Rectal cancer  INTERVAL HISTORY:   Mr. Metzgar returns as scheduled.  He began radiation and concurrent infusional 5-FU 04/03/2017.  He denies nausea/vomiting.  No mouth sores.  He notes his tongue is "sensitive".  No diarrhea.  He notes bleeding when he becomes constipated.  He continues a stool softener.  Pain overall is better.  At present he rates his pain at a "0".  Pain is "up and down".  He takes oxycodone as needed.  No hand or foot pain or redness.  Leg swelling has resolved.  Appetite is better.  He notes urinary frequency.  Objective:  Vital signs in last 24 hours:  Blood pressure 116/64, pulse 74, temperature 98.7 F (37.1 C), temperature source Oral, resp. rate 17, height 6\' 2"  (1.88 m), weight 145 lb 1.6 oz (65.8 kg), SpO2 100 %.    HEENT: No thrush or ulcers. Resp: Lungs clear bilaterally. Cardio: Regular rate and rhythm. GI: Abdomen is soft and nontender.  No hepatomegaly. Vascular: No leg edema. Skin: Palms without erythema. Port-A-Cath without erythema.   Lab Results:  Lab Results  Component Value Date   WBC 5.5 04/15/2017   HGB 10.4 (L) 04/15/2017   HCT 30.9 (L) 04/15/2017   MCV 92.8 04/15/2017   PLT 240 04/15/2017   NEUTROABS 3.8 04/15/2017    Imaging:  No results found.  Medications: I have reviewed the patient's current medications.  Assessment/Plan: 1. 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 Abdomen MRI 10/06/2016 (T4bN2)tumor, negative chest CT ? 1 cycle of CapoxJuly 2018 ? Transferred care to [facility] August 2018 ? Status post Port-A-Cath placement 12/14/2016 ? CT abdomen/pelvis 02/09/2017-mass at the left rectum with adjacent fat stranding and subcentimeter nodules,  ill-defined hypodense lesion in the superior left liver-indeterminate ? 3 cycles of FOLFOX, last cycle 02/15/2017 ? Radiation 04/03/2017 ? Infusional 5-FU 04/03/2017 2.Pain secondary to #1.  Improved 04/17/2017. 3.Constipation secondary to #1 and narcotics 4.Psychiatric admission August 2018-depression/suicidal ideation, felt to be related to capecitabine per patient 5.Family history of multiple cancers including pancreas and colon cancer.  He has been seen by the genetics counselor.    Disposition: Mr. Nishiyama appears stable.  He continues radiation and infusional 5-FU.  He will return for labs and a pump change in 1 week.  We will see him in follow-up in 2 weeks.  He complains of urinary frequency.  He had a negative urinalysis on 04/15/2017.  Question radiation cystitis.    Ned Card ANP/GNP-BC   04/17/2017  3:53 PM

## 2017-04-18 DIAGNOSIS — Z833 Family history of diabetes mellitus: Secondary | ICD-10-CM | POA: Diagnosis not present

## 2017-04-18 DIAGNOSIS — C20 Malignant neoplasm of rectum: Secondary | ICD-10-CM | POA: Diagnosis not present

## 2017-04-18 DIAGNOSIS — Z510 Encounter for antineoplastic radiation therapy: Secondary | ICD-10-CM | POA: Diagnosis present

## 2017-04-18 DIAGNOSIS — Z79891 Long term (current) use of opiate analgesic: Secondary | ICD-10-CM | POA: Diagnosis not present

## 2017-04-18 DIAGNOSIS — Z803 Family history of malignant neoplasm of breast: Secondary | ICD-10-CM | POA: Diagnosis not present

## 2017-04-18 DIAGNOSIS — Z79899 Other long term (current) drug therapy: Secondary | ICD-10-CM | POA: Diagnosis not present

## 2017-04-18 DIAGNOSIS — R45851 Suicidal ideations: Secondary | ICD-10-CM | POA: Diagnosis not present

## 2017-04-18 DIAGNOSIS — Z9889 Other specified postprocedural states: Secondary | ICD-10-CM | POA: Diagnosis not present

## 2017-04-18 DIAGNOSIS — F17200 Nicotine dependence, unspecified, uncomplicated: Secondary | ICD-10-CM | POA: Diagnosis not present

## 2017-04-18 DIAGNOSIS — Z800 Family history of malignant neoplasm of digestive organs: Secondary | ICD-10-CM | POA: Diagnosis not present

## 2017-04-18 DIAGNOSIS — K6289 Other specified diseases of anus and rectum: Secondary | ICD-10-CM | POA: Diagnosis not present

## 2017-04-19 ENCOUNTER — Ambulatory Visit
Admission: RE | Admit: 2017-04-19 | Discharge: 2017-04-19 | Disposition: A | Discharge status: Home / Self Care | Payer: BLUE CROSS/BLUE SHIELD | Source: Ambulatory Visit | Attending: Radiation Oncology | Admitting: Radiation Oncology

## 2017-04-19 DIAGNOSIS — Z510 Encounter for antineoplastic radiation therapy: Secondary | ICD-10-CM | POA: Diagnosis not present

## 2017-04-20 ENCOUNTER — Ambulatory Visit (HOSPITAL_COMMUNITY): Admission: RE | Admit: 2017-04-20 | Payer: BLUE CROSS/BLUE SHIELD | Source: Ambulatory Visit

## 2017-04-20 ENCOUNTER — Ambulatory Visit
Admission: RE | Admit: 2017-04-20 | Discharge: 2017-04-20 | Disposition: A | Discharge status: Home / Self Care | Payer: BLUE CROSS/BLUE SHIELD | Source: Ambulatory Visit | Attending: Radiation Oncology | Admitting: Radiation Oncology

## 2017-04-20 ENCOUNTER — Telehealth: Payer: Self-pay | Admitting: Oncology

## 2017-04-20 DIAGNOSIS — Z510 Encounter for antineoplastic radiation therapy: Secondary | ICD-10-CM | POA: Diagnosis not present

## 2017-04-20 NOTE — Telephone Encounter (Signed)
Left message for patient regarding upcoming January appointments.  °

## 2017-04-21 ENCOUNTER — Ambulatory Visit
Admission: RE | Admit: 2017-04-21 | Discharge: 2017-04-21 | Disposition: A | Discharge status: Home / Self Care | Payer: BLUE CROSS/BLUE SHIELD | Source: Ambulatory Visit | Attending: Radiation Oncology | Admitting: Radiation Oncology

## 2017-04-21 ENCOUNTER — Encounter: Payer: Self-pay | Admitting: Radiation Oncology

## 2017-04-21 DIAGNOSIS — Z510 Encounter for antineoplastic radiation therapy: Secondary | ICD-10-CM | POA: Diagnosis not present

## 2017-04-21 NOTE — Progress Notes (Signed)
Financial Counselor--Called patient--gave him information about our [employer] and transportation assistance--he is going to bring in his income verification today and apply for grant--I also will provide him with a bus pass

## 2017-04-22 ENCOUNTER — Other Ambulatory Visit: Payer: Self-pay | Admitting: Oncology

## 2017-04-24 ENCOUNTER — Inpatient Hospital Stay: Payer: Medicaid Other

## 2017-04-24 ENCOUNTER — Ambulatory Visit
Admission: RE | Admit: 2017-04-24 | Discharge: 2017-04-24 | Disposition: A | Discharge status: Home / Self Care | Payer: BLUE CROSS/BLUE SHIELD | Source: Ambulatory Visit | Attending: Radiation Oncology | Admitting: Radiation Oncology

## 2017-04-24 ENCOUNTER — Inpatient Hospital Stay: Payer: Medicaid Other | Attending: Oncology

## 2017-04-24 VITALS — BP 92/66 | HR 87 | Temp 98.2°F | Resp 16

## 2017-04-24 VITALS — BP 112/62 | HR 66 | Temp 98.2°F | Resp 16

## 2017-04-24 DIAGNOSIS — Z510 Encounter for antineoplastic radiation therapy: Secondary | ICD-10-CM | POA: Diagnosis not present

## 2017-04-24 DIAGNOSIS — T40605S Adverse effect of unspecified narcotics, sequela: Secondary | ICD-10-CM | POA: Insufficient documentation

## 2017-04-24 DIAGNOSIS — C20 Malignant neoplasm of rectum: Secondary | ICD-10-CM

## 2017-04-24 DIAGNOSIS — K5903 Drug induced constipation: Secondary | ICD-10-CM | POA: Diagnosis not present

## 2017-04-24 DIAGNOSIS — Z79899 Other long term (current) drug therapy: Secondary | ICD-10-CM | POA: Insufficient documentation

## 2017-04-24 DIAGNOSIS — Z800 Family history of malignant neoplasm of digestive organs: Secondary | ICD-10-CM | POA: Diagnosis not present

## 2017-04-24 DIAGNOSIS — Z452 Encounter for adjustment and management of vascular access device: Secondary | ICD-10-CM | POA: Diagnosis not present

## 2017-04-24 DIAGNOSIS — Z5111 Encounter for antineoplastic chemotherapy: Secondary | ICD-10-CM | POA: Diagnosis not present

## 2017-04-24 DIAGNOSIS — Z95828 Presence of other vascular implants and grafts: Secondary | ICD-10-CM

## 2017-04-24 LAB — COMPREHENSIVE METABOLIC PANEL
ALT: 15 U/L (ref 0–55)
AST: 15 U/L (ref 5–34)
Albumin: 3.9 g/dL (ref 3.5–5.0)
Alkaline Phosphatase: 60 U/L (ref 40–150)
Anion gap: 7 (ref 3–11)
BUN: 13 mg/dL (ref 7–26)
CHLORIDE: 105 mmol/L (ref 98–109)
CO2: 27 mmol/L (ref 22–29)
Calcium: 8.8 mg/dL (ref 8.4–10.4)
Creatinine, Ser: 0.84 mg/dL (ref 0.70–1.30)
Glucose, Bld: 120 mg/dL (ref 70–140)
POTASSIUM: 3.7 mmol/L (ref 3.5–5.1)
Sodium: 139 mmol/L (ref 136–145)
Total Bilirubin: 0.5 mg/dL (ref 0.2–1.2)
Total Protein: 7 g/dL (ref 6.4–8.3)

## 2017-04-24 LAB — CBC WITH DIFFERENTIAL/PLATELET
ABS GRANULOCYTE: 3 10*3/uL (ref 1.5–6.5)
BASOS ABS: 0 10*3/uL (ref 0.0–0.1)
Basophils Relative: 0 %
Eosinophils Absolute: 0.2 10*3/uL (ref 0.0–0.5)
Eosinophils Relative: 4 %
HEMATOCRIT: 37.3 % — AB (ref 38.4–49.9)
HEMOGLOBIN: 12.4 g/dL — AB (ref 13.0–17.1)
LYMPHS PCT: 19 %
Lymphs Abs: 0.9 10*3/uL (ref 0.9–3.3)
MCH: 30.9 pg (ref 27.2–33.4)
MCHC: 33.2 g/dL (ref 32.0–36.0)
MCV: 93 fL (ref 79.3–98.0)
Monocytes Absolute: 0.6 10*3/uL (ref 0.1–0.9)
Monocytes Relative: 13 %
NEUTROS PCT: 64 %
Neutro Abs: 3 10*3/uL (ref 1.5–6.5)
Platelets: 212 10*3/uL (ref 140–400)
RBC: 4.01 MIL/uL — AB (ref 4.20–5.82)
RDW: 14.4 % (ref 11.0–15.6)
WBC: 4.8 10*3/uL (ref 4.0–10.3)

## 2017-04-24 MED ORDER — SODIUM CHLORIDE 0.9 % IV SOLN
225.0000 mg/m2/d | INTRAVENOUS | Status: DC
  Administered 2017-04-24: 2900 mg via INTRAVENOUS
  Filled 2017-04-24: qty 58

## 2017-04-24 MED ORDER — HEPARIN SOD (PORK) LOCK FLUSH 100 UNIT/ML IV SOLN
500.0000 [IU] | Freq: Once | INTRAVENOUS | Status: AC | PRN
  Administered 2017-04-24: 500 [IU] via INTRAVENOUS
  Filled 2017-04-24: qty 5

## 2017-04-24 MED ORDER — SODIUM CHLORIDE 0.9% FLUSH
10.0000 mL | INTRAVENOUS | Status: DC | PRN
  Administered 2017-04-24: 10 mL via INTRAVENOUS
  Filled 2017-04-24: qty 10

## 2017-04-24 NOTE — Patient Instructions (Signed)
[facility] Discharge Instructions for Patients Receiving Chemotherapy  Today you received the following chemotherapy agents: 5FU  To help prevent nausea and vomiting after your treatment, we encourage you to take your nausea medication as directed.   If you develop nausea and vomiting that is not controlled by your nausea medication, call the clinic.   BELOW ARE SYMPTOMS THAT SHOULD BE REPORTED IMMEDIATELY:  *FEVER GREATER THAN 100.5 F  *CHILLS WITH OR WITHOUT FEVER  NAUSEA AND VOMITING THAT IS NOT CONTROLLED WITH YOUR NAUSEA MEDICATION  *UNUSUAL SHORTNESS OF BREATH  *UNUSUAL BRUISING OR BLEEDING  TENDERNESS IN MOUTH AND THROAT WITH OR WITHOUT PRESENCE OF ULCERS  *URINARY PROBLEMS  *BOWEL PROBLEMS  UNUSUAL RASH Items with * indicate a potential emergency and should be followed up as soon as possible.  Feel free to call the clinic should you have any questions or concerns. The clinic phone number is [phone].  Please show the CHEMO ALERT CARD at check-in to the Emergency Department and triage nurse.   

## 2017-04-25 ENCOUNTER — Ambulatory Visit
Admission: RE | Admit: 2017-04-25 | Discharge: 2017-04-25 | Disposition: A | Discharge status: Home / Self Care | Payer: BLUE CROSS/BLUE SHIELD | Source: Ambulatory Visit | Attending: Radiation Oncology | Admitting: Radiation Oncology

## 2017-04-25 DIAGNOSIS — Z510 Encounter for antineoplastic radiation therapy: Secondary | ICD-10-CM | POA: Diagnosis not present

## 2017-04-26 ENCOUNTER — Ambulatory Visit
Admission: RE | Admit: 2017-04-26 | Discharge: 2017-04-26 | Disposition: A | Discharge status: Home / Self Care | Payer: BLUE CROSS/BLUE SHIELD | Source: Ambulatory Visit | Attending: Radiation Oncology | Admitting: Radiation Oncology

## 2017-04-26 DIAGNOSIS — Z510 Encounter for antineoplastic radiation therapy: Secondary | ICD-10-CM | POA: Diagnosis not present

## 2017-04-27 ENCOUNTER — Ambulatory Visit
Admission: RE | Admit: 2017-04-27 | Discharge: 2017-04-27 | Disposition: A | Discharge status: Home / Self Care | Payer: BLUE CROSS/BLUE SHIELD | Source: Ambulatory Visit | Attending: Radiation Oncology | Admitting: Radiation Oncology

## 2017-04-27 DIAGNOSIS — Z510 Encounter for antineoplastic radiation therapy: Secondary | ICD-10-CM | POA: Diagnosis not present

## 2017-04-28 ENCOUNTER — Ambulatory Visit
Admission: RE | Admit: 2017-04-28 | Discharge: 2017-04-28 | Disposition: A | Discharge status: Home / Self Care | Payer: BLUE CROSS/BLUE SHIELD | Source: Ambulatory Visit | Attending: Radiation Oncology | Admitting: Radiation Oncology

## 2017-04-28 ENCOUNTER — Telehealth: Payer: Self-pay | Admitting: Oncology

## 2017-04-28 ENCOUNTER — Telehealth: Payer: Self-pay

## 2017-04-28 ENCOUNTER — Other Ambulatory Visit: Payer: Self-pay | Admitting: Radiation Oncology

## 2017-04-28 DIAGNOSIS — Z510 Encounter for antineoplastic radiation therapy: Secondary | ICD-10-CM | POA: Diagnosis not present

## 2017-04-28 DIAGNOSIS — C20 Malignant neoplasm of rectum: Secondary | ICD-10-CM

## 2017-04-28 MED ORDER — OXYCODONE HCL 5 MG PO TABS: 5.0000 mg | ORAL_TABLET | ORAL | 0 refills | Status: DC | PRN

## 2017-04-28 MED ORDER — DOCUSATE SODIUM 50 MG PO CAPS: 50.0000 mg | ORAL_CAPSULE | Freq: Two times a day (BID) | ORAL | 5 refills | Status: DC

## 2017-04-28 MED FILL — oxyCODONE HCL 5 MG TABS: 5 | 10 days supply | Qty: 60 | Fill #0

## 2017-04-28 NOTE — Telephone Encounter (Signed)
FAXED [facility]  [phone]

## 2017-04-28 NOTE — Telephone Encounter (Signed)
Per Tedra Coupe, "the CD burner is broken", discs to be loaded are being sent to [facility] for loading.

## 2017-04-30 ENCOUNTER — Other Ambulatory Visit: Payer: Self-pay | Admitting: Oncology

## 2017-05-01 ENCOUNTER — Inpatient Hospital Stay: Payer: Medicaid Other

## 2017-05-01 ENCOUNTER — Encounter (HOSPITAL_COMMUNITY): Payer: BLUE CROSS/BLUE SHIELD

## 2017-05-01 ENCOUNTER — Telehealth: Payer: Self-pay | Admitting: Nurse Practitioner

## 2017-05-01 ENCOUNTER — Encounter: Payer: Self-pay | Admitting: Nurse Practitioner

## 2017-05-01 ENCOUNTER — Inpatient Hospital Stay (HOSPITAL_BASED_OUTPATIENT_CLINIC_OR_DEPARTMENT_OTHER): Payer: Medicaid Other | Admitting: Nurse Practitioner

## 2017-05-01 ENCOUNTER — Ambulatory Visit
Admission: RE | Admit: 2017-05-01 | Discharge: 2017-05-01 | Disposition: A | Discharge status: Home / Self Care | Payer: BLUE CROSS/BLUE SHIELD | Source: Ambulatory Visit | Attending: Radiation Oncology | Admitting: Radiation Oncology

## 2017-05-01 VITALS — BP 111/66 | HR 83 | Temp 98.3°F | Ht 74.0 in | Wt 148.6 lb

## 2017-05-01 DIAGNOSIS — K5903 Drug induced constipation: Secondary | ICD-10-CM | POA: Diagnosis not present

## 2017-05-01 DIAGNOSIS — C20 Malignant neoplasm of rectum: Secondary | ICD-10-CM

## 2017-05-01 DIAGNOSIS — Z95828 Presence of other vascular implants and grafts: Secondary | ICD-10-CM

## 2017-05-01 DIAGNOSIS — T40605S Adverse effect of unspecified narcotics, sequela: Secondary | ICD-10-CM

## 2017-05-01 DIAGNOSIS — Z800 Family history of malignant neoplasm of digestive organs: Secondary | ICD-10-CM

## 2017-05-01 DIAGNOSIS — Z510 Encounter for antineoplastic radiation therapy: Secondary | ICD-10-CM | POA: Diagnosis not present

## 2017-05-01 DIAGNOSIS — Z79899 Other long term (current) drug therapy: Secondary | ICD-10-CM | POA: Diagnosis not present

## 2017-05-01 DIAGNOSIS — Z5111 Encounter for antineoplastic chemotherapy: Secondary | ICD-10-CM | POA: Diagnosis not present

## 2017-05-01 LAB — COMPREHENSIVE METABOLIC PANEL
ALBUMIN: 3.7 g/dL (ref 3.5–5.0)
ALK PHOS: 52 U/L (ref 40–150)
ALT: 12 U/L (ref 0–55)
AST: 15 U/L (ref 5–34)
Anion gap: 7 (ref 3–11)
BUN: 8 mg/dL (ref 7–26)
CALCIUM: 8.8 mg/dL (ref 8.4–10.4)
CHLORIDE: 106 mmol/L (ref 98–109)
CO2: 29 mmol/L (ref 22–29)
CREATININE: 0.82 mg/dL (ref 0.70–1.30)
GFR calc Af Amer: >60 mL/min (ref >60)
GFR calc non Af Amer: >60 mL/min (ref >60)
GLUCOSE: 111 mg/dL (ref 70–140)
Potassium: 3.3 mmol/L — ABNORMAL LOW (ref 3.5–5.1)
SODIUM: 142 mmol/L (ref 136–145)
Total Bilirubin: 0.5 mg/dL (ref 0.2–1.2)
Total Protein: 6.5 g/dL (ref 6.4–8.3)

## 2017-05-01 LAB — CBC WITH DIFFERENTIAL/PLATELET
BASOS PCT: 1 %
Basophils Absolute: 0.1 10*3/uL (ref 0.0–0.1)
EOS ABS: 0.2 10*3/uL (ref 0.0–0.5)
Eosinophils Relative: 4 %
HEMATOCRIT: 35.9 % — AB (ref 38.4–49.9)
Hemoglobin: 12 g/dL — ABNORMAL LOW (ref 13.0–17.1)
LYMPHS ABS: 0.6 10*3/uL — AB (ref 0.9–3.3)
Lymphocytes Relative: 11 %
MCH: 31.5 pg (ref 27.2–33.4)
MCHC: 33.5 g/dL (ref 32.0–36.0)
MCV: 94 fL (ref 79.3–98.0)
MONO ABS: 0.6 10*3/uL (ref 0.1–0.9)
MONOS PCT: 10 %
Neutro Abs: 4.3 10*3/uL (ref 1.5–6.5)
Neutrophils Relative %: 74 %
Platelets: 242 10*3/uL (ref 140–400)
RBC: 3.82 MIL/uL — ABNORMAL LOW (ref 4.20–5.82)
RDW: 15.2 % (ref 11.0–15.6)
WBC: 5.8 10*3/uL (ref 4.0–10.3)

## 2017-05-01 MED ORDER — SODIUM CHLORIDE 0.9% FLUSH
10.0000 mL | INTRAVENOUS | Status: DC | PRN
  Administered 2017-05-01: 10 mL via INTRAVENOUS
  Filled 2017-05-01: qty 10

## 2017-05-01 MED ORDER — FLUOROURACIL CHEMO INJECTION 5 GM/100ML
225.0000 mg/m2/d | INTRAVENOUS | Status: DC
  Administered 2017-05-01: 2900 mg via INTRAVENOUS
  Filled 2017-05-01: qty 58

## 2017-05-01 MED ORDER — HEPARIN SOD (PORK) LOCK FLUSH 100 UNIT/ML IV SOLN
500.0000 [IU] | Freq: Once | INTRAVENOUS | Status: DC | PRN
  Filled 2017-05-01: qty 5

## 2017-05-01 NOTE — Progress Notes (Signed)
  [facility] OFFICE PROGRESS NOTE   Diagnosis: Rectal cancer  INTERVAL HISTORY:   [name] returns as scheduled.  He continues radiation and infusional 5-fluorouracil.  Pain continues to be improved.  He notes improvement with bowel movements as well.  He thinks the constipation is somewhat diet related.  No rectal bleeding.  No nausea or vomiting.  No hand or foot pain or redness.  Objective:  Vital signs in last 24 hours:  Blood pressure 111/66, pulse 83, temperature 98.3 F (36.8 C), temperature source Oral, height 6\' 2"  (1.88 m), weight 148 lb 9.6 oz (67.4 kg), SpO2 100 %.    HEENT: No thrush or ulcers. Resp: Lungs clear bilaterally. Cardio: Regular rate and rhythm. GI: Abdomen soft and nontender.  No hepatomegaly. Vascular: No leg edema.  Skin: Palms without erythema. Port-A-Cath without erythema.   Lab Results:  Lab Results  Component Value Date   WBC 5.8 05/01/2017   HGB 12.0 (L) 05/01/2017   HCT 35.9 (L) 05/01/2017   MCV 94.0 05/01/2017   PLT 242 05/01/2017   NEUTROABS 4.3 05/01/2017    Imaging:  No results found.  Medications: I have reviewed the patient's current medications.  Assessment/Plan: 1. 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 Abdomen MRI 10/06/2016 (T4bN2)tumor, negative chest CT ? 1 cycle of CapoxJuly 2018 ? Transferred care to [facility] August 2018 ? Status post Port-A-Cath placement 12/14/2016 ? CT abdomen/pelvis 02/09/2017-mass at the left rectum with adjacent fat stranding and subcentimeter nodules, ill-defined hypodense lesion in the superior left liver-indeterminate ? 3 cycles of FOLFOX, last cycle 02/15/2017 ? Radiation 04/03/2017 ? Infusional 5-FU 04/03/2017 2.Pain secondary to #1.  Improved  04/17/2017. 3.Constipation secondary to #1 and narcotics 4.Psychiatric admission August 2018-depression/suicidal ideation, felt to be related to capecitabine per patient 5.Family history of multiple cancers including pancreas and colon cancer.  He has been seen by the genetics counselor.    Disposition: Jacob Galvan appears stable.  He will continue radiation and infusional 5-FU.  He is scheduled to complete the course of radiation 05/12/2017.  5-FU pump will be discontinued on 05/12/2017.  We provided him with the contact information for his surgeon.  He will call and make an appointment.  We will see him back in 2 weeks.  He will contact the office in the interim with any problems.    Ned Card ANP/GNP-BC   05/01/2017  2:45 PM

## 2017-05-01 NOTE — Patient Instructions (Signed)
[facility] Discharge Instructions for Patients Receiving Chemotherapy  Today you received the following chemotherapy agents: Adrucil.  To help prevent nausea and vomiting after your treatment, we encourage you to take your nausea medication as directed.   If you develop nausea and vomiting that is not controlled by your nausea medication, call the clinic.   BELOW ARE SYMPTOMS THAT SHOULD BE REPORTED IMMEDIATELY:  *FEVER GREATER THAN 100.5 F  *CHILLS WITH OR WITHOUT FEVER  NAUSEA AND VOMITING THAT IS NOT CONTROLLED WITH YOUR NAUSEA MEDICATION  *UNUSUAL SHORTNESS OF BREATH  *UNUSUAL BRUISING OR BLEEDING  TENDERNESS IN MOUTH AND THROAT WITH OR WITHOUT PRESENCE OF ULCERS  *URINARY PROBLEMS  *BOWEL PROBLEMS  UNUSUAL RASH Items with * indicate a potential emergency and should be followed up as soon as possible.  Feel free to call the clinic should you have any questions or concerns. The clinic phone number is [phone].  Please show the CHEMO ALERT CARD at check-in to the Emergency Department and triage nurse.   

## 2017-05-01 NOTE — Telephone Encounter (Signed)
Scheduled appt per 1/14 los - Patient to get an updated schedule in treatment area.

## 2017-05-02 ENCOUNTER — Ambulatory Visit: Payer: BLUE CROSS/BLUE SHIELD

## 2017-05-03 ENCOUNTER — Ambulatory Visit
Admission: RE | Admit: 2017-05-03 | Discharge: 2017-05-03 | Disposition: A | Discharge status: Home / Self Care | Payer: BLUE CROSS/BLUE SHIELD | Source: Ambulatory Visit | Attending: Radiation Oncology | Admitting: Radiation Oncology

## 2017-05-03 DIAGNOSIS — Z510 Encounter for antineoplastic radiation therapy: Secondary | ICD-10-CM | POA: Diagnosis not present

## 2017-05-04 ENCOUNTER — Ambulatory Visit
Admission: RE | Admit: 2017-05-04 | Discharge: 2017-05-04 | Disposition: A | Discharge status: Home / Self Care | Payer: BLUE CROSS/BLUE SHIELD | Source: Ambulatory Visit | Attending: Radiation Oncology | Admitting: Radiation Oncology

## 2017-05-04 ENCOUNTER — Encounter: Payer: Self-pay | Admitting: General Practice

## 2017-05-04 ENCOUNTER — Other Ambulatory Visit: Payer: Self-pay | Admitting: Nurse Practitioner

## 2017-05-04 DIAGNOSIS — Z510 Encounter for antineoplastic radiation therapy: Secondary | ICD-10-CM | POA: Diagnosis not present

## 2017-05-04 DIAGNOSIS — C20 Malignant neoplasm of rectum: Secondary | ICD-10-CM

## 2017-05-04 NOTE — Progress Notes (Signed)
[place] CSW Progress Note  Patient needs help w [employer] bus fare - CSW made appointment to meet w patient 1/18 at 2 PM after his treatment.  Will provide [employer] 10 ride bus pass.  Edwyna Shell, LCSW Clinical Social Worker Phone:  [phone]

## 2017-05-05 ENCOUNTER — Ambulatory Visit: Payer: BLUE CROSS/BLUE SHIELD

## 2017-05-05 ENCOUNTER — Encounter: Payer: Self-pay | Admitting: General Practice

## 2017-05-05 ENCOUNTER — Telehealth: Payer: Self-pay | Admitting: Oncology

## 2017-05-05 NOTE — Telephone Encounter (Signed)
Spoke with patient regarding appts on his scheduled and returned phone call.

## 2017-05-05 NOTE — Progress Notes (Signed)
[facility] CSW Progress Note  CSW spoke w patient - he did not come for treatment today and thus did not pick up [employer] bus pass that had been requested.  Patient will stop by Monday to pick up in [place].  Edwyna Shell, LCSW Clinical Social Worker Phone:  [phone]

## 2017-05-06 ENCOUNTER — Encounter (HOSPITAL_COMMUNITY): Payer: Self-pay | Admitting: Nurse Practitioner

## 2017-05-06 DIAGNOSIS — Z5321 Procedure and treatment not carried out due to patient leaving prior to being seen by health care provider: Secondary | ICD-10-CM | POA: Insufficient documentation

## 2017-05-06 NOTE — ED Triage Notes (Signed)
Pt is requesting his port-a-cath be reaccessed so that he can continue getting his at home chemo. States it got deaccessed accidentally at home.

## 2017-05-07 ENCOUNTER — Other Ambulatory Visit: Payer: Self-pay | Admitting: Oncology

## 2017-05-07 ENCOUNTER — Emergency Department (HOSPITAL_COMMUNITY)
Admission: EM | Admit: 2017-05-07 | Discharge: 2017-05-07 | Disposition: A | Discharge status: Home / Self Care | Payer: Self-pay | Attending: Emergency Medicine | Admitting: Emergency Medicine

## 2017-05-07 NOTE — ED Notes (Signed)
Called patient to room and no answer. 

## 2017-05-08 ENCOUNTER — Ambulatory Visit
Admission: RE | Admit: 2017-05-08 | Discharge: 2017-05-08 | Disposition: A | Discharge status: Home / Self Care | Payer: BLUE CROSS/BLUE SHIELD | Source: Ambulatory Visit | Attending: Radiation Oncology | Admitting: Radiation Oncology

## 2017-05-08 ENCOUNTER — Inpatient Hospital Stay: Payer: Medicaid Other

## 2017-05-08 ENCOUNTER — Ambulatory Visit (HOSPITAL_COMMUNITY)
Admission: RE | Admit: 2017-05-08 | Discharge: 2017-05-08 | Disposition: A | Discharge status: Home / Self Care | Payer: BLUE CROSS/BLUE SHIELD | Source: Ambulatory Visit | Attending: Radiation Oncology | Admitting: Radiation Oncology

## 2017-05-08 VITALS — BP 101/59 | HR 94 | Temp 99.0°F | Resp 17

## 2017-05-08 DIAGNOSIS — Z510 Encounter for antineoplastic radiation therapy: Secondary | ICD-10-CM | POA: Diagnosis not present

## 2017-05-08 DIAGNOSIS — D169 Benign neoplasm of bone and articular cartilage, unspecified: Secondary | ICD-10-CM | POA: Insufficient documentation

## 2017-05-08 DIAGNOSIS — C20 Malignant neoplasm of rectum: Secondary | ICD-10-CM

## 2017-05-08 DIAGNOSIS — Z5111 Encounter for antineoplastic chemotherapy: Secondary | ICD-10-CM | POA: Diagnosis not present

## 2017-05-08 DIAGNOSIS — K769 Liver disease, unspecified: Secondary | ICD-10-CM | POA: Insufficient documentation

## 2017-05-08 LAB — BASIC METABOLIC PANEL - CANCER CENTER ONLY
ANION GAP: 8 (ref 3–11)
BUN: 6 mg/dL — ABNORMAL LOW (ref 7–26)
CO2: 29 mmol/L (ref 22–29)
Calcium: 9.4 mg/dL (ref 8.4–10.4)
Chloride: 103 mmol/L (ref 98–109)
Creatinine: 0.92 mg/dL (ref 0.70–1.30)
GFR, Est AFR Am: >60 mL/min (ref >60)
GFR, Estimated: >60 mL/min (ref >60)
GLUCOSE: 121 mg/dL (ref 70–140)
POTASSIUM: 4 mmol/L (ref 3.5–5.1)
Sodium: 140 mmol/L (ref 136–145)

## 2017-05-08 LAB — CBC WITH DIFFERENTIAL (CANCER CENTER ONLY)
BASOS PCT: 0 %
Basophils Absolute: 0 10*3/uL (ref 0.0–0.1)
EOS PCT: 4 %
Eosinophils Absolute: 0.2 10*3/uL (ref 0.0–0.5)
HEMATOCRIT: 40.9 % (ref 38.4–49.9)
Hemoglobin: 13.7 g/dL (ref 13.0–17.1)
Lymphocytes Relative: 11 %
Lymphs Abs: 0.7 10*3/uL — ABNORMAL LOW (ref 0.9–3.3)
MCH: 31.7 pg (ref 27.2–33.4)
MCHC: 33.5 g/dL (ref 32.0–36.0)
MCV: 94.7 fL (ref 79.3–98.0)
MONOS PCT: 14 %
Monocytes Absolute: 0.9 10*3/uL (ref 0.1–0.9)
NEUTROS ABS: 4.4 10*3/uL (ref 1.5–6.5)
Neutrophils Relative %: 71 %
PLATELETS: 268 10*3/uL (ref 140–400)
RBC: 4.32 MIL/uL (ref 4.20–5.82)
RDW: 14.8 % (ref 11.0–15.6)
WBC Count: 6.2 10*3/uL (ref 4.0–10.3)

## 2017-05-08 LAB — GLUCOSE, CAPILLARY: Glucose-Capillary: 98 mg/dL (ref 65–99)

## 2017-05-08 MED ORDER — SODIUM CHLORIDE 0.9 % IV SOLN
225.0000 mg/m2/d | INTRAVENOUS | Status: DC
  Administered 2017-05-08: 2900 mg via INTRAVENOUS
  Filled 2017-05-08: qty 58

## 2017-05-08 MED ORDER — FLUDEOXYGLUCOSE F - 18 (FDG) INJECTION
7.3000 | Freq: Once | INTRAVENOUS | Status: AC | PRN
  Administered 2017-05-08: 7.3 via INTRAVENOUS

## 2017-05-08 NOTE — Patient Instructions (Signed)
[facility] Discharge Instructions for Patients Receiving Chemotherapy  Today you received the following chemotherapy agents: Adrucil.  To help prevent nausea and vomiting after your treatment, we encourage you to take your nausea medication as directed.   If you develop nausea and vomiting that is not controlled by your nausea medication, call the clinic.   BELOW ARE SYMPTOMS THAT SHOULD BE REPORTED IMMEDIATELY:  *FEVER GREATER THAN 100.5 F  *CHILLS WITH OR WITHOUT FEVER  NAUSEA AND VOMITING THAT IS NOT CONTROLLED WITH YOUR NAUSEA MEDICATION  *UNUSUAL SHORTNESS OF BREATH  *UNUSUAL BRUISING OR BLEEDING  TENDERNESS IN MOUTH AND THROAT WITH OR WITHOUT PRESENCE OF ULCERS  *URINARY PROBLEMS  *BOWEL PROBLEMS  UNUSUAL RASH Items with * indicate a potential emergency and should be followed up as soon as possible.  Feel free to call the clinic should you have any questions or concerns. The clinic phone number is [phone].  Please show the CHEMO ALERT CARD at check-in to the Emergency Department and triage nurse.   

## 2017-05-08 NOTE — Progress Notes (Signed)
Patient arrived to appointment today with needle locked and in bag with chemo. 78ml of Adrucil remained in bag according to pump. He reports he noticed late on Sat 1/19 that his needle "fell out into my clothes" and he went to the ER late that day and "there was not a nurse to put my needle back in". He came to clinic today with [facility] accessed by radiology nurses earlier today but chemo was not hooked up by anyone in the chemo room earlier today, he did not come here any sooner than now. MD Benay Spice paged to advise about new pump and new dose for today.  Will proceed with a new pump today, no change to order per MD Benay Spice

## 2017-05-09 ENCOUNTER — Ambulatory Visit
Admission: RE | Admit: 2017-05-09 | Discharge: 2017-05-09 | Disposition: A | Discharge status: Home / Self Care | Payer: BLUE CROSS/BLUE SHIELD | Source: Ambulatory Visit | Attending: Radiation Oncology | Admitting: Radiation Oncology

## 2017-05-09 ENCOUNTER — Telehealth: Payer: Self-pay | Admitting: Radiation Oncology

## 2017-05-09 DIAGNOSIS — C20 Malignant neoplasm of rectum: Secondary | ICD-10-CM

## 2017-05-09 DIAGNOSIS — Z510 Encounter for antineoplastic radiation therapy: Secondary | ICD-10-CM | POA: Diagnosis not present

## 2017-05-09 NOTE — Telephone Encounter (Signed)
I discussed findings with the patient from his PET scan. Dr. Lisbeth Renshaw recommends liver biopsy. The patient is interested in proceeding.

## 2017-05-10 ENCOUNTER — Encounter: Payer: Self-pay | Admitting: Radiation Oncology

## 2017-05-10 ENCOUNTER — Other Ambulatory Visit: Payer: Self-pay

## 2017-05-10 ENCOUNTER — Ambulatory Visit
Admission: RE | Admit: 2017-05-10 | Discharge: 2017-05-10 | Disposition: A | Discharge status: Home / Self Care | Payer: BLUE CROSS/BLUE SHIELD | Source: Ambulatory Visit | Attending: Radiation Oncology | Admitting: Radiation Oncology

## 2017-05-10 ENCOUNTER — Ambulatory Visit: Admission: RE | Admit: 2017-05-10 | Payer: BLUE CROSS/BLUE SHIELD | Source: Ambulatory Visit

## 2017-05-10 VITALS — BP 93/63 | HR 94 | Temp 98.4°F | Resp 18 | Ht 74.0 in | Wt 149.6 lb

## 2017-05-10 DIAGNOSIS — Z510 Encounter for antineoplastic radiation therapy: Secondary | ICD-10-CM | POA: Diagnosis not present

## 2017-05-10 DIAGNOSIS — C20 Malignant neoplasm of rectum: Secondary | ICD-10-CM

## 2017-05-10 NOTE — Progress Notes (Signed)
Patient was seen today for evaluation with concerns of separation following radiotherapy, he states that last week is no sloughing from his perianal region as well as one episode of bleeding.  He states his bowels are more regular, and he is not using any stool softeners.  He states that he continues narcotic medication as needed for rectal pain secondary to passing stools.  He denies any fevers or chills, and denies any rectal discharge.  On evaluation, it appears that he does have some desquamation that is dry, this is noted for the most part along the base of the gluteal cleft.  No evidence of skin breakdown is otherwise appreciated.  There is hyperpigmentation consistent with his radiotherapy field.  We discussed the findings and reassured the patient regarding the changes that we would anticipate with his style of treatment.  He is given a jar of Silvadene cream to use and apply as needed, and is counseled on the timing of placing this around the time of radiotherapy.  He will keep [place] informed of his progress, and we could add additional cream to this.  He is also thinking about trying aloe vera.     Carola Rhine, PAC

## 2017-05-10 NOTE — Progress Notes (Signed)
Jacob Galvan 42 y.o.  man with stage IIIC, [phone] adenocarcinoma of the rectum here for a skin check today.   Pain:No, taking oxycodone usually at night. Fatigue:Most of the time. Loss of appetite: Good all of the time. Monitor for weight loss:Gaining weight Weight: 4.8 pounds since 04-17-17 Wt Readings from Last 3 Encounters:  05/10/17 149 lb 9.6 oz (67.9 kg)  05/01/17 148 lb 9.6 oz (67.4 kg)  04/17/17 145 lb 1.6 oz (65.8 kg)   Taking Xeloda ?No Nausea/vomiting:No Diarrhea:No Rectal bleeding:No Urinary bladder changes:Normal urination every 2-3 hours. Skin irritation:Rectal irritation reports his rectum burns hurts to wipe after bowel movement. BP 93/63 (BP Location: Left Arm, Patient Position: Sitting, Cuff Size: Normal)   Pulse 94   Temp 98.4 F (36.9 C) (Oral)   Resp 18   Ht 6\' 2"  (1.88 m)   Wt 149 lb 9.6 oz (67.9 kg)   SpO2 100%   BMI 19.21 kg/m

## 2017-05-11 ENCOUNTER — Other Ambulatory Visit: Payer: Self-pay | Admitting: *Deleted

## 2017-05-11 ENCOUNTER — Ambulatory Visit
Admission: RE | Admit: 2017-05-11 | Discharge: 2017-05-11 | Disposition: A | Discharge status: Home / Self Care | Payer: BLUE CROSS/BLUE SHIELD | Source: Ambulatory Visit | Attending: Radiation Oncology | Admitting: Radiation Oncology

## 2017-05-11 ENCOUNTER — Ambulatory Visit: Admission: RE | Admit: 2017-05-11 | Payer: BLUE CROSS/BLUE SHIELD | Source: Ambulatory Visit

## 2017-05-11 DIAGNOSIS — C20 Malignant neoplasm of rectum: Secondary | ICD-10-CM

## 2017-05-11 DIAGNOSIS — Z510 Encounter for antineoplastic radiation therapy: Secondary | ICD-10-CM | POA: Diagnosis not present

## 2017-05-11 MED ORDER — SILVER SULFADIAZINE 1 % EX CREA: TOPICAL_CREAM | Freq: Once | CUTANEOUS | Status: DC

## 2017-05-11 MED ORDER — SILVER SULFADIAZINE 1 % EX CREA
TOPICAL_CREAM | Freq: Once | CUTANEOUS | Status: AC
  Administered 2017-05-11: 12:00:00 via TOPICAL

## 2017-05-12 ENCOUNTER — Ambulatory Visit: Payer: BLUE CROSS/BLUE SHIELD

## 2017-05-15 ENCOUNTER — Ambulatory Visit: Payer: BLUE CROSS/BLUE SHIELD

## 2017-05-15 ENCOUNTER — Other Ambulatory Visit: Payer: Self-pay | Admitting: Radiology

## 2017-05-15 ENCOUNTER — Ambulatory Visit: Payer: Self-pay | Admitting: Oncology

## 2017-05-15 ENCOUNTER — Inpatient Hospital Stay: Payer: Medicaid Other

## 2017-05-15 ENCOUNTER — Ambulatory Visit
Admission: RE | Admit: 2017-05-15 | Discharge: 2017-05-15 | Disposition: A | Discharge status: Home / Self Care | Payer: BLUE CROSS/BLUE SHIELD | Source: Ambulatory Visit | Attending: Radiation Oncology | Admitting: Radiation Oncology

## 2017-05-15 VITALS — BP 102/64 | HR 88 | Temp 98.6°F | Resp 18

## 2017-05-15 DIAGNOSIS — C20 Malignant neoplasm of rectum: Secondary | ICD-10-CM

## 2017-05-15 DIAGNOSIS — Z510 Encounter for antineoplastic radiation therapy: Secondary | ICD-10-CM | POA: Diagnosis not present

## 2017-05-15 DIAGNOSIS — Z5111 Encounter for antineoplastic chemotherapy: Secondary | ICD-10-CM | POA: Diagnosis not present

## 2017-05-15 MED ORDER — HEPARIN SOD (PORK) LOCK FLUSH 100 UNIT/ML IV SOLN
500.0000 [IU] | Freq: Once | INTRAVENOUS | Status: AC | PRN
  Administered 2017-05-15: 500 [IU]
  Filled 2017-05-15: qty 5

## 2017-05-15 MED ORDER — SODIUM CHLORIDE 0.9% FLUSH
10.0000 mL | INTRAVENOUS | Status: DC | PRN
  Administered 2017-05-15: 10 mL
  Filled 2017-05-15: qty 10

## 2017-05-16 ENCOUNTER — Ambulatory Visit: Payer: BLUE CROSS/BLUE SHIELD

## 2017-05-16 ENCOUNTER — Other Ambulatory Visit: Payer: Self-pay | Admitting: General Surgery

## 2017-05-16 ENCOUNTER — Telehealth: Payer: Self-pay

## 2017-05-16 NOTE — Telephone Encounter (Signed)
Called pt regarding missed appointment. Stated that he "tried to reschedule yesterday, I spoke with the schedulers and they said Dr. Benay Spice was booked through Feb. I'm also trying to reschedule my biopsy now". Pt states "everything is going ok, just going through some personal issues right now". This RN voiced understanding. Will make MD aware. Schedulers to call pt with availability for new appt. Pt voiced understanding.

## 2017-05-17 ENCOUNTER — Telehealth: Payer: Self-pay | Admitting: Oncology

## 2017-05-17 ENCOUNTER — Ambulatory Visit
Admission: RE | Admit: 2017-05-17 | Discharge: 2017-05-17 | Disposition: A | Discharge status: Home / Self Care | Payer: BLUE CROSS/BLUE SHIELD | Source: Ambulatory Visit | Attending: Radiation Oncology | Admitting: Radiation Oncology

## 2017-05-17 ENCOUNTER — Ambulatory Visit (HOSPITAL_COMMUNITY): Payer: Self-pay

## 2017-05-17 ENCOUNTER — Telehealth: Payer: Self-pay

## 2017-05-17 ENCOUNTER — Encounter (HOSPITAL_COMMUNITY): Payer: Self-pay

## 2017-05-17 ENCOUNTER — Ambulatory Visit: Payer: BLUE CROSS/BLUE SHIELD

## 2017-05-17 DIAGNOSIS — C19 Malignant neoplasm of rectosigmoid junction: Secondary | ICD-10-CM

## 2017-05-17 DIAGNOSIS — C20 Malignant neoplasm of rectum: Secondary | ICD-10-CM

## 2017-05-17 DIAGNOSIS — Z510 Encounter for antineoplastic radiation therapy: Secondary | ICD-10-CM | POA: Diagnosis not present

## 2017-05-17 MED ORDER — OXYCODONE HCL 5 MG PO TABS: 5.0000 mg | ORAL_TABLET | ORAL | 0 refills | Status: DC | PRN

## 2017-05-17 MED ORDER — HYDROCODONE-ACETAMINOPHEN 5-325 MG PO TABS: 1.0000 | ORAL_TABLET | Freq: Four times a day (QID) | ORAL | 0 refills | Status: DC | PRN

## 2017-05-17 MED FILL — oxyCODONE HCL 5 MG TABS: 5 | 7 days supply | Qty: 42 | Fill #0

## 2017-05-17 NOTE — Telephone Encounter (Signed)
Scheduled appt per 1/29 sch message - Patient is aware of appt date and time.

## 2017-05-17 NOTE — Telephone Encounter (Signed)
Spoke with pt to inform that oxycodone script is available for pick-up.

## 2017-05-18 ENCOUNTER — Ambulatory Visit: Payer: BLUE CROSS/BLUE SHIELD

## 2017-05-18 ENCOUNTER — Ambulatory Visit
Admission: RE | Admit: 2017-05-18 | Discharge: 2017-05-18 | Disposition: A | Discharge status: Home / Self Care | Payer: BLUE CROSS/BLUE SHIELD | Source: Ambulatory Visit | Attending: Radiation Oncology | Admitting: Radiation Oncology

## 2017-05-18 DIAGNOSIS — Z510 Encounter for antineoplastic radiation therapy: Secondary | ICD-10-CM | POA: Diagnosis not present

## 2017-05-19 ENCOUNTER — Encounter: Payer: Self-pay | Admitting: Radiation Oncology

## 2017-05-19 ENCOUNTER — Ambulatory Visit
Admission: RE | Admit: 2017-05-19 | Discharge: 2017-05-19 | Disposition: A | Discharge status: Home / Self Care | Payer: BLUE CROSS/BLUE SHIELD | Source: Ambulatory Visit | Attending: Radiation Oncology | Admitting: Radiation Oncology

## 2017-05-19 DIAGNOSIS — Z510 Encounter for antineoplastic radiation therapy: Secondary | ICD-10-CM | POA: Diagnosis not present

## 2017-05-24 ENCOUNTER — Ambulatory Visit: Payer: Self-pay | Admitting: Oncology

## 2017-05-29 ENCOUNTER — Telehealth: Payer: Self-pay

## 2017-05-29 ENCOUNTER — Other Ambulatory Visit: Payer: Self-pay | Admitting: General Surgery

## 2017-05-29 NOTE — Progress Notes (Signed)
  Radiation Oncology         [phone]) [phone] ________________________________  Name: Jacob Galvan. MRN: [id number]  Date: 05/19/2017  DOB: Jan 28, 1976  End of Treatment Note  Diagnosis:   42 y.o. male with Stage III adenocarcinoma of the rectum    Indication for treatment::  curative       Radiation treatment dates:   04/03/2017 - 05/19/2017  Site/dose:   The rectum was initially treated to 45 Gy in 25 fractions, followed by a 5.4 Gy boost in 3 fractions to yield a total dose of 50.4 Gy.  Narrative: The patient tolerated radiation treatment relatively well with mild fatigue. He experienced some constipation and dyschezia, relieved with stool softener and narcotic medication. He denied any hematochezia or rectal discharge. He reported some skin breakdown in the treatment field with dry desquamation. Hyperpigmentation was also noted, and he is using Silvadene as directed.   Plan: The patient has completed radiation treatment. The patient will return to radiation oncology clinic for routine followup in one month. I advised the patient to call or return sooner if they have any questions or concerns related to their recovery or treatment. ________________________________  Jodelle Gross, MD, PhD  This document serves as a record of services personally performed by Kyung Rudd, MD. It was created on his behalf by Rae Lips, a trained medical scribe. The creation of this record is based on the scribe's personal observations and the provider's statements to them. This document has been checked and approved by the attending provider.

## 2017-05-29 NOTE — Telephone Encounter (Signed)
Pt requested oxycodone refill. Per Dr. Benay Spice he wants to have visit with pt before refilling. Will send message to scheduling and they will call with appt. Pt to go to ED if pain is uncontrollable. Pt voiced understanding and knows to call back with any further symptoms.

## 2017-05-30 ENCOUNTER — Ambulatory Visit (HOSPITAL_COMMUNITY)
Admission: RE | Admit: 2017-05-30 | Discharge: 2017-05-30 | Disposition: A | Discharge status: Home / Self Care | Payer: Medicaid Other | Source: Ambulatory Visit | Attending: Radiation Oncology | Admitting: Radiation Oncology

## 2017-05-30 ENCOUNTER — Encounter (HOSPITAL_COMMUNITY): Payer: Self-pay

## 2017-05-30 DIAGNOSIS — Z808 Family history of malignant neoplasm of other organs or systems: Secondary | ICD-10-CM | POA: Insufficient documentation

## 2017-05-30 DIAGNOSIS — Z803 Family history of malignant neoplasm of breast: Secondary | ICD-10-CM | POA: Insufficient documentation

## 2017-05-30 DIAGNOSIS — Z9889 Other specified postprocedural states: Secondary | ICD-10-CM | POA: Insufficient documentation

## 2017-05-30 DIAGNOSIS — Z833 Family history of diabetes mellitus: Secondary | ICD-10-CM | POA: Insufficient documentation

## 2017-05-30 DIAGNOSIS — F17200 Nicotine dependence, unspecified, uncomplicated: Secondary | ICD-10-CM | POA: Insufficient documentation

## 2017-05-30 DIAGNOSIS — Z800 Family history of malignant neoplasm of digestive organs: Secondary | ICD-10-CM | POA: Diagnosis not present

## 2017-05-30 DIAGNOSIS — Z8043 Family history of malignant neoplasm of testis: Secondary | ICD-10-CM | POA: Diagnosis not present

## 2017-05-30 DIAGNOSIS — K769 Liver disease, unspecified: Secondary | ICD-10-CM | POA: Diagnosis present

## 2017-05-30 DIAGNOSIS — C20 Malignant neoplasm of rectum: Secondary | ICD-10-CM | POA: Diagnosis present

## 2017-05-30 DIAGNOSIS — Z8249 Family history of ischemic heart disease and other diseases of the circulatory system: Secondary | ICD-10-CM | POA: Diagnosis not present

## 2017-05-30 DIAGNOSIS — D134 Benign neoplasm of liver: Secondary | ICD-10-CM | POA: Diagnosis not present

## 2017-05-30 DIAGNOSIS — Z836 Family history of other diseases of the respiratory system: Secondary | ICD-10-CM | POA: Insufficient documentation

## 2017-05-30 LAB — CBC WITH DIFFERENTIAL/PLATELET
BASOS ABS: 0 10*3/uL (ref 0.0–0.1)
BASOS PCT: 0 %
Eosinophils Absolute: 0.2 10*3/uL (ref 0.0–0.7)
Eosinophils Relative: 3 %
HEMATOCRIT: 36.2 % — AB (ref 39.0–52.0)
Hemoglobin: 12.3 g/dL — ABNORMAL LOW (ref 13.0–17.0)
Lymphocytes Relative: 15 %
Lymphs Abs: 0.9 10*3/uL (ref 0.7–4.0)
MCH: 31.5 pg (ref 26.0–34.0)
MCHC: 34 g/dL (ref 30.0–36.0)
MCV: 92.8 fL (ref 78.0–100.0)
Monocytes Absolute: 0.6 10*3/uL (ref 0.1–1.0)
Monocytes Relative: 10 %
NEUTROS ABS: 4.6 10*3/uL (ref 1.7–7.7)
Neutrophils Relative %: 72 %
Platelets: 275 10*3/uL (ref 150–400)
RBC: 3.9 MIL/uL — AB (ref 4.22–5.81)
RDW: 14.6 % (ref 11.5–15.5)
WBC: 6.4 10*3/uL (ref 4.0–10.5)

## 2017-05-30 LAB — COMPREHENSIVE METABOLIC PANEL
ALK PHOS: 49 U/L (ref 38–126)
ALT: 17 U/L (ref 17–63)
ANION GAP: 10 (ref 5–15)
AST: 21 U/L (ref 15–41)
Albumin: 3.8 g/dL (ref 3.5–5.0)
BILIRUBIN TOTAL: 0.8 mg/dL (ref 0.3–1.2)
BUN: 9 mg/dL (ref 6–20)
CALCIUM: 8.7 mg/dL — AB (ref 8.9–10.3)
CO2: 26 mmol/L (ref 22–32)
Chloride: 105 mmol/L (ref 101–111)
Creatinine, Ser: 0.75 mg/dL (ref 0.61–1.24)
GFR calc Af Amer: >60 mL/min (ref >60)
GFR calc non Af Amer: >60 mL/min (ref >60)
Glucose, Bld: 107 mg/dL — ABNORMAL HIGH (ref 65–99)
Potassium: 3.2 mmol/L — ABNORMAL LOW (ref 3.5–5.1)
SODIUM: 141 mmol/L (ref 135–145)
TOTAL PROTEIN: 6.6 g/dL (ref 6.5–8.1)

## 2017-05-30 LAB — PROTIME-INR
INR: 0.95
Prothrombin Time: 12.6 seconds (ref 11.4–15.2)

## 2017-05-30 MED ORDER — LIDOCAINE HCL (PF) 1 % IJ SOLN
INTRAMUSCULAR | Status: AC | PRN
  Administered 2017-05-30: 10 mL

## 2017-05-30 MED ORDER — HYDROCODONE-ACETAMINOPHEN 5-325 MG PO TABS: 1.0000 | ORAL_TABLET | ORAL | Status: DC | PRN

## 2017-05-30 MED ORDER — MIDAZOLAM HCL 2 MG/2ML IJ SOLN
INTRAMUSCULAR | Status: AC | PRN
Start: 2017-05-30 — End: 2017-05-30
  Administered 2017-05-30: 0.5 mg via INTRAVENOUS
  Administered 2017-05-30: 1 mg via INTRAVENOUS

## 2017-05-30 MED ORDER — MIDAZOLAM HCL 2 MG/2ML IJ SOLN
INTRAMUSCULAR | Status: AC
  Filled 2017-05-30: qty 4

## 2017-05-30 MED ORDER — FENTANYL CITRATE (PF) 100 MCG/2ML IJ SOLN
INTRAMUSCULAR | Status: AC | PRN
  Administered 2017-05-30: 25 ug via INTRAVENOUS
  Administered 2017-05-30: 50 ug via INTRAVENOUS

## 2017-05-30 MED ORDER — LIDOCAINE HCL 1 % IJ SOLN
INTRAMUSCULAR | Status: AC
  Filled 2017-05-30: qty 20

## 2017-05-30 MED ORDER — FENTANYL CITRATE (PF) 100 MCG/2ML IJ SOLN
INTRAMUSCULAR | Status: AC
  Filled 2017-05-30: qty 4

## 2017-05-30 MED ORDER — SODIUM CHLORIDE 0.9 % IV SOLN
INTRAVENOUS | Status: DC
  Administered 2017-05-30: 12:00:00 via INTRAVENOUS

## 2017-05-30 MED ORDER — GELATIN ABSORBABLE 12-7 MM EX MISC
CUTANEOUS | Status: AC | PRN
  Administered 2017-05-30: 1 via TOPICAL

## 2017-05-30 MED ORDER — NALOXONE HCL 0.4 MG/ML IJ SOLN
INTRAMUSCULAR | Status: AC
  Filled 2017-05-30: qty 1

## 2017-05-30 MED ORDER — FLUMAZENIL 0.5 MG/5ML IV SOLN
INTRAVENOUS | Status: AC
  Filled 2017-05-30: qty 5

## 2017-05-30 MED ORDER — GELATIN ABSORBABLE 12-7 MM EX MISC
CUTANEOUS | Status: AC
  Filled 2017-05-30: qty 1

## 2017-05-30 NOTE — H&P (Signed)
Chief Complaint: liver lesion  Referring Physician:Dr. Izola Price. [place]  Supervising Physician: Aletta Edouard  [facility]  HPI: Jacob Galvan. is a 42 y.o. male with a history of rectal cancer.  He has undergone chemotherapy as well as radiation therapy in the past.  He recently had a PET scan that revealed a hypermetabolic lesion in the dome of his liver.  He has been referred here today for a biopsy to confirm metastatic disease.  The patient denies chest pain or shortness of breath.  He does express some difficulty urinating, baseline after radiation, as well as rectal pain.  Otherwise, he has no complaints.  Past Medical History:  Past Medical History:  Diagnosis Date  . Colon cancer ([place])   . Rectal cancer ([place]) 2018    Past Surgical History:  Past Surgical History:  Procedure Laterality Date  . COLONOSCOPY    . PORTA CATH INSERTION      Family History:  Family History  Problem Relation Age of Onset  . Diabetes Other   . Diabetes Maternal Grandmother   . Colon cancer Paternal Grandfather   . Pancreatic cancer Paternal Grandfather 30  . Breast cancer Paternal Aunt 70  . Melanoma Sister 4       x2  . Testicular cancer Maternal Grandfather 11  . COPD Paternal Grandmother   . Colon cancer Paternal Uncle 62  . Pancreatic cancer Paternal Uncle   . Testicular cancer Other   . Heart Problems Son 5    Social History:  reports that he has been smoking.  he has never used smokeless tobacco. He reports that he drinks alcohol. He reports that he uses drugs. Drug: Marijuana.  Allergies: No Known Allergies  Medications: Medications reviewed in epic  Please HPI for pertinent positives, otherwise complete 10 system ROS negative.  Mallampati Score: MD Evaluation Airway: WNL Heart: WNL Abdomen: WNL Chest/ Lungs: WNL ASA  Classification: 2 Mallampati/Airway Score: One  Physical Exam: BP 100/67 (BP Location: Right Arm)   Pulse 77   Temp 99 F  (37.2 C) (Oral)   Resp 18   SpO2 100%  There is no height or weight on file to calculate BMI. General: pleasant, WD, WN black male who is laying in bed in NAD HEENT: head is normocephalic, atraumatic.  Sclera are noninjected.  PERRL.  Ears and nose without any masses or lesions.  Mouth is pink and moist Heart: regular, rate, and rhythm.  Normal s1,s2. No obvious murmurs, gallops, or rubs noted.  Palpable radial pulses bilaterally Lungs: CTAB, no wheezes, rhonchi, or rales noted.  Respiratory effort nonlabored Abd: soft, NT, ND, +BS, no masses, hernias, or organomegaly Psych: A&Ox3 with an appropriate affect.   Labs: Results for orders placed or performed during the hospital encounter of 05/30/17 (from the past 48 hour(s))  Comprehensive metabolic panel     Status: Abnormal   Collection Time: 05/30/17 11:36 AM  Result Value Ref Range   Sodium 141 135 - 145 mmol/L   Potassium 3.2 (L) 3.5 - 5.1 mmol/L   Chloride 105 101 - 111 mmol/L   CO2 26 22 - 32 mmol/L   Glucose, Bld 107 (H) 65 - 99 mg/dL   BUN 9 6 - 20 mg/dL   Creatinine, Ser 0.75 0.61 - 1.24 mg/dL   Calcium 8.7 (L) 8.9 - 10.3 mg/dL   Total Protein 6.6 6.5 - 8.1 g/dL   Albumin 3.8 3.5 - 5.0 g/dL   AST 21 15 -  41 U/L   ALT 17 17 - 63 U/L   Alkaline Phosphatase 49 38 - 126 U/L   Total Bilirubin 0.8 0.3 - 1.2 mg/dL   GFR calc non Af Amer >60 >60 mL/min   GFR calc Af Amer >60 >60 mL/min    Comment: (NOTE) The eGFR has been calculated using the CKD EPI equation. This calculation has not been validated in all clinical situations. eGFR's persistently <60 mL/min signify possible Chronic Kidney Disease.    Anion gap 10 5 - 15    Comment: Performed at [facility], [place] [address] [place], [place] [address]  CBC with Differential/Platelet     Status: Abnormal   Collection Time: 05/30/17 11:36 AM  Result Value Ref Range   WBC 6.4 4.0 - 10.5 K/uL   RBC 3.90 (L) 4.22 - 5.81 MIL/uL   Hemoglobin 12.3 (L) 13.0 -  17.0 g/dL   HCT 36.2 (L) 39.0 - 52.0 %   MCV 92.8 78.0 - 100.0 fL   MCH 31.5 26.0 - 34.0 pg   MCHC 34.0 30.0 - 36.0 g/dL   RDW 14.6 11.5 - 15.5 %   Platelets 275 150 - 400 K/uL   Neutrophils Relative % 72 %   Neutro Abs 4.6 1.7 - 7.7 K/uL   Lymphocytes Relative 15 %   Lymphs Abs 0.9 0.7 - 4.0 K/uL   Monocytes Relative 10 %   Monocytes Absolute 0.6 0.1 - 1.0 K/uL   Eosinophils Relative 3 %   Eosinophils Absolute 0.2 0.0 - 0.7 K/uL   Basophils Relative 0 %   Basophils Absolute 0.0 0.0 - 0.1 K/uL    Comment: Performed at [facility], [place] [address], [place], [place] [address]  Protime-INR     Status: None   Collection Time: 05/30/17 11:36 AM  Result Value Ref Range   Prothrombin Time 12.6 11.4 - 15.2 seconds   INR 0.95     Comment: Performed at [facility], [place] [address], [place], [place] [address]    Imaging: No results found.  Assessment/Plan 1. Liver lesion, history of rectal cancer  Plan to proceed today with a liver lesion biopsy.  Labs and vitals have been reviewed.  Risks and benefits discussed with the patient including, but not limited to bleeding, infection, damage to adjacent structures or low yield requiring additional tests.  All of the patient's questions were answered, patient is agreeable to proceed. Consent signed and in chart.  Thank you for this interesting consult.  I greatly enjoyed meeting Giordan Fordham. and look forward to participating in their care.  A copy of this report was sent to the requesting provider on this date.  Electronically Signed: Henreitta Cea 05/30/2017, 12:42 PM   I spent a total of  30 Minutes   in face to face in clinical consultation, greater than 50% of which was counseling/coordinating care for liver lesion, history of rectal cancer

## 2017-05-30 NOTE — Sedation Documentation (Signed)
Patient is resting comfortably. 

## 2017-05-30 NOTE — Procedures (Signed)
Interventional Radiology Procedure Note  Procedure: US guided core biopsy of left lobe liver lesion   Complications: None  Estimated Blood Loss: < 10 mL  Findings: Roughly 2 cm lesion near dome of left lobe indistinct, but felt to be visualized and relatively hyperechoic. 18 G core biopsy x 3 via 17 G needle. Gelfoam pledgets advanced as outer needle retracted on completion.  Venetia Night. Kathlene Cote, M.D Pager:  [phone]

## 2017-05-30 NOTE — Sedation Documentation (Signed)
Patient is resting comfortably. MD made aware of SBP in 80's

## 2017-05-30 NOTE — Discharge Instructions (Signed)
You may remove dressing and bathe in 24 hours.   Liver Biopsy, Care After These instructions give you information on caring for yourself after your procedure. Your doctor may also give you more specific instructions. Call your doctor if you have any problems or questions after your procedure. Follow these instructions at home:  Rest at home for 1-2 days or as told by your doctor.  Have someone stay with you for at least 24 hours.  Do not do these things in the first 24 hours: ? Drive. ? Use machinery. ? Take care of other people. ? Sign legal documents. ? Take a bath or shower.  There are many different ways to close and cover a cut (incision). For example, a cut can be closed with stitches, skin glue, or adhesive strips. Follow your doctor's instructions on: ? Taking care of your cut. ? Changing and removing your bandage (dressing). ? Removing whatever was used to close your cut.  Do not drink alcohol in the first week.  Do not lift more than 5 pounds or play contact sports for the first 2 weeks.  Take medicines only as told by your doctor. For 1 week, do not take medicine that has aspirin in it or medicines like ibuprofen.  Get your test results. Contact a doctor if:  A cut bleeds and leaves more than just a small spot of blood.  A cut is red, puffs up (swells), or hurts more than before.  Fluid or something else comes from a cut.  A cut smells bad.  You have a fever or chills. Get help right away if:  You have swelling, bloating, or pain in your belly (abdomen).  You get dizzy or faint.  You have a rash.  You feel sick to your stomach (nauseous) or throw up (vomit).  You have trouble breathing, feel short of breath, or feel faint.  Your chest hurts.  You have problems talking or seeing.  You have trouble balancing or moving your arms or legs. This information is not intended to replace advice given to you by your health care provider. Make sure you discuss  any questions you have with your health care provider. Document Released: 01/12/2008 Document Revised: 09/10/2015 Document Reviewed: 05/31/2013 Elsevier Interactive Patient Education  2018 [employer].    Moderate Conscious Sedation, Adult, Care After These instructions provide you with information about caring for yourself after your procedure. Your health care provider may also give you more specific instructions. Your treatment has been planned according to current medical practices, but problems sometimes occur. Call your health care provider if you have any problems or questions after your procedure. What can I expect after the procedure? After your procedure, it is common:  To feel sleepy for several hours.  To feel clumsy and have poor balance for several hours.  To have poor judgment for several hours.  To vomit if you eat too soon.  Follow these instructions at home: For at least 24 hours after the procedure:   Do not: ? Participate in activities where you could fall or become injured. ? Drive. ? Use heavy machinery. ? Drink alcohol. ? Take sleeping pills or medicines that cause drowsiness. ? Make important decisions or sign legal documents. ? Take care of children on your own.  Rest. Eating and drinking  Follow the diet recommended by your health care provider.  If you vomit: ? Drink water, juice, or soup when you can drink without vomiting. ? Make sure you have  little or no nausea before eating solid foods. General instructions  Have a responsible adult stay with you until you are awake and alert.  Take over-the-counter and prescription medicines only as told by your health care provider.  If you smoke, do not smoke without supervision.  Keep all follow-up visits as told by your health care provider. This is important. Contact a health care provider if:  You keep feeling nauseous or you keep vomiting.  You feel light-headed.  You develop a  rash.  You have a fever. Get help right away if:  You have trouble breathing. This information is not intended to replace advice given to you by your health care provider. Make sure you discuss any questions you have with your health care provider. Document Released: 01/23/2013 Document Revised: 09/07/2015 Document Reviewed: 07/25/2015 Elsevier Interactive Patient Education  [employer].

## 2017-06-05 ENCOUNTER — Telehealth: Payer: Self-pay | Admitting: Oncology

## 2017-06-05 NOTE — Telephone Encounter (Signed)
Spoke with patient - scheduled appt per 2/18 sch message - patient Is aware of appt date and time.

## 2017-06-12 ENCOUNTER — Inpatient Hospital Stay: Payer: Medicaid Other | Attending: Oncology | Admitting: Nurse Practitioner

## 2017-06-12 ENCOUNTER — Encounter: Payer: Self-pay | Admitting: Nurse Practitioner

## 2017-06-12 ENCOUNTER — Telehealth: Payer: Self-pay | Admitting: Oncology

## 2017-06-12 VITALS — BP 105/65 | HR 94 | Temp 98.6°F | Resp 18 | Ht 74.0 in | Wt 161.4 lb

## 2017-06-12 DIAGNOSIS — T40605S Adverse effect of unspecified narcotics, sequela: Secondary | ICD-10-CM | POA: Diagnosis not present

## 2017-06-12 DIAGNOSIS — Z923 Personal history of irradiation: Secondary | ICD-10-CM

## 2017-06-12 DIAGNOSIS — K769 Liver disease, unspecified: Secondary | ICD-10-CM | POA: Diagnosis not present

## 2017-06-12 DIAGNOSIS — L819 Disorder of pigmentation, unspecified: Secondary | ICD-10-CM | POA: Diagnosis not present

## 2017-06-12 DIAGNOSIS — G893 Neoplasm related pain (acute) (chronic): Secondary | ICD-10-CM | POA: Diagnosis not present

## 2017-06-12 DIAGNOSIS — R202 Paresthesia of skin: Secondary | ICD-10-CM

## 2017-06-12 DIAGNOSIS — K5903 Drug induced constipation: Secondary | ICD-10-CM | POA: Insufficient documentation

## 2017-06-12 DIAGNOSIS — C20 Malignant neoplasm of rectum: Secondary | ICD-10-CM | POA: Insufficient documentation

## 2017-06-12 DIAGNOSIS — R200 Anesthesia of skin: Secondary | ICD-10-CM | POA: Diagnosis not present

## 2017-06-12 DIAGNOSIS — Z79899 Other long term (current) drug therapy: Secondary | ICD-10-CM

## 2017-06-12 DIAGNOSIS — Z800 Family history of malignant neoplasm of digestive organs: Secondary | ICD-10-CM | POA: Diagnosis not present

## 2017-06-12 MED ORDER — OXYCODONE HCL 5 MG PO TABS: 5.0000 mg | ORAL_TABLET | ORAL | 0 refills | Status: DC | PRN

## 2017-06-12 MED ORDER — SORBITOL 70 % PO SOLN: 30.0000 mL | Freq: Two times a day (BID) | ORAL | 2 refills | Status: DC | PRN

## 2017-06-12 MED FILL — oxyCODONE HCL 5 MG TABS: 5 | 7 days supply | Qty: 42 | Fill #0

## 2017-06-12 MED FILL — SORBITOL 70% SOLUTION: 70 | 20 days supply | Qty: 1200 | Fill #0

## 2017-06-12 NOTE — Telephone Encounter (Signed)
Appointment scheduled AVS/Calendar printed per 2/25 los

## 2017-06-12 NOTE — Progress Notes (Signed)
[place] [facility] OFFICE PROGRESS NOTE   Diagnosis: Rectal cancer  INTERVAL HISTORY:   Jacob Galvan returns for follow-up.  The 5-fluorouracil pump was removed 05/15/2017.  He completed the course of radiation 05/19/2017.  He continues to have constipation and pain with bowel movements.  He takes oxycodone as needed.  However, the constipation and pain are improved as compared to prior to radiation.  No rectal bleeding.  He has persistent numbness/tingling/cold sensitivity in the feet.  He reports he has not seen the surgeon due to a lack of insurance.  Objective:  Vital signs in last 24 hours:  Blood pressure 105/65, pulse 94, temperature 98.6 F (37 C), temperature source Oral, resp. rate 18, height 6\' 2"  (1.88 m), weight 161 lb 6.4 oz (73.2 kg), SpO2 100 %.    HEENT: No thrush or ulcers. Resp: Lungs clear bilaterally. Cardio: Regular rate and rhythm. GI: Abdomen soft and nontender.  No hepatomegaly. Vascular: No leg edema.  Skin: Palms with hyperpigmentation.  No erythema or skin breakdown. Port-A-Cath without erythema.  Lab Results:  Lab Results  Component Value Date   WBC 6.4 05/30/2017   HGB 12.3 (L) 05/30/2017   HCT 36.2 (L) 05/30/2017   MCV 92.8 05/30/2017   PLT 275 05/30/2017   NEUTROABS 4.6 05/30/2017    Imaging:  No results found.  Medications: I have reviewed the patient's current medications.  Assessment/Plan: 1. 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 Abdomen MRI 10/06/2016 (T4bN2)tumor, negative chest CT ? 1 cycle of CapoxJuly 2018 ? Transferred care to [place] center August 2018 ? Status post Port-A-Cath placement 12/14/2016 ? CT abdomen/pelvis 02/09/2017-mass at the left rectum with adjacent fat stranding and subcentimeter nodules,  ill-defined hypodense lesion in the superior left liver-indeterminate ? 3 cycles of FOLFOX, last cycle 02/15/2017 ? Radiation 04/03/2017; completed 05/19/2017 ? Infusional 5-FU 04/03/2017; completed 05/15/2017 ? PET scan 05/08/2017-rectal mass fairly mildly hypermetabolic with maximum SUV 5.7, with adjacent perirectal tumor nodularity mildly hypermetabolic with maximum SUV 3.1.  Dominant lesion in the dome of the left hepatic lobe is hypermetabolic with maximum SUV of 4.7.  Smaller 4 mm hypodense lesion centrally in the liver is not appreciably hypermetabolic. ? 05/30/2017 biopsy of liver lesion-negative for malignancy 2.Pain secondary to #1.Improved 04/17/2017. 3.Constipation secondary to #1 and narcotics 4.Psychiatric admission August 2018-depression/suicidal ideation, felt to be related to capecitabine per patient 5.Family history of multiple cancers including pancreas and colon cancer. He has been seen by the genetics counselor.    Disposition: Jacob Galvan appears stable.  He completed the course of radiation/infusional 5-FU approximately 1 month ago.  He has been unable to see the surgeon due to a lack of insurance.  The [facility] nurse navigator will meet with him today to assist with the appointment with surgery.  He continues to have pain at the rectum.  This is improved as compared to pretreatment.  He was provided with a new oxycodone prescription at today's visit.  He will return for a Port-A-Cath flush and follow-up visit in 3 weeks.  He will contact the office in the interim with any problems.  Patient seen with Dr. Benay Spice.    Ned Card ANP/GNP-BC   06/12/2017  2:38 PM This was a shared visit with Ned Card.  Jacob Galvan completed the course of 5-FU and radiation.  There was a  suspicious liver lesion on PET.  A biopsy was non-diagnostic.  We discussed treatment of the rectal cancer with Jacob Galvan.  I recommend he see Dr. Dema Severin to discuss surgery.  Dr. Dema Severin may  be able to evaluate the liver lesion intraoperatively.  We can consider salvage chemotherapy if he has metastatic disease or declined surgery.  Julieanne Manson, MD

## 2017-06-22 ENCOUNTER — Ambulatory Visit
Admission: RE | Admit: 2017-06-22 | Discharge: 2017-06-22 | Disposition: A | Discharge status: Home / Self Care | Payer: Medicaid Other | Source: Ambulatory Visit | Attending: Radiation Oncology | Admitting: Radiation Oncology

## 2017-06-22 ENCOUNTER — Encounter: Payer: Self-pay | Admitting: General Practice

## 2017-06-22 ENCOUNTER — Other Ambulatory Visit: Payer: Self-pay

## 2017-06-22 ENCOUNTER — Encounter: Payer: Self-pay | Admitting: Radiation Oncology

## 2017-06-22 VITALS — BP 107/64 | HR 79 | Temp 98.5°F | Resp 18 | Ht 74.0 in | Wt 158.0 lb

## 2017-06-22 DIAGNOSIS — Z79899 Other long term (current) drug therapy: Secondary | ICD-10-CM | POA: Diagnosis not present

## 2017-06-22 DIAGNOSIS — G629 Polyneuropathy, unspecified: Secondary | ICD-10-CM | POA: Insufficient documentation

## 2017-06-22 DIAGNOSIS — C20 Malignant neoplasm of rectum: Secondary | ICD-10-CM | POA: Diagnosis not present

## 2017-06-22 DIAGNOSIS — Z923 Personal history of irradiation: Secondary | ICD-10-CM | POA: Insufficient documentation

## 2017-06-22 NOTE — Progress Notes (Signed)
Radiation Oncology         [phone]) [phone] ________________________________  Name: Jacob Galvan. MRN: [id number]  Date of Service: 06/22/2017 DOB: Aug 17, 1975  Post Treatment Note  CC: System, Pcp Not In  Ladell Pier, MD  Diagnosis:   Stage III adenocarcinoma of the rectum    Interval Since Last Radiation:  5 weeks   04/03/2017 - 05/19/2017: The rectum was initially treated to 45 Gy in 25 fractions, followed by a 5.4 Gy boost in 3 fractions to yield a total dose of 50.4 Gy.   Narrative:  The patient returns today for routine follow-up.  The patient tolerated radiotherapy and started noticing improvement in his symptoms during radiation.                              On review of systems, the patient states he is doing well and reports occasional constipation and is taking sorbitol prn. He is taking oxycodone at night only for his rectal fullness and discomfort. He denies any rectal bleeding but notes some mucous with gas and with BMs. He continues to have peripheral neuropathy. No other complaints are noted.  ALLERGIES:  has No Known Allergies.  Meds: Current Outpatient Medications  Medication Sig Dispense Refill  . docusate sodium (COLACE) 50 MG capsule Take 1 capsule (50 mg total) by mouth 2 (two) times daily. 60 capsule 5  . oxyCODONE (OXY IR/ROXICODONE) 5 MG immediate release tablet Take 1 tablet (5 mg total) by mouth every 4 (four) hours as needed for severe pain. 42 tablet 0  . sorbitol 70 % solution Take 30 mLs by mouth 2 (two) times daily as needed. 1200 mL 2  . diphenhydramine-acetaminophen (TYLENOL PM) 25-500 MG TABS tablet Take 2 tablets by mouth at bedtime as needed (pain, sleep).    . polyethylene glycol (GOLYTELY) 236 g solution 2 L by mouth over 4 hours (Patient not taking: Reported on 06/22/2017) 2000 mL 2   No current facility-administered medications for this encounter.     Physical Findings:  height is 6\' 2"  (1.88 m) and weight is 158 lb (71.7 kg). His oral  temperature is 98.5 F (36.9 C). His blood pressure is 107/64 and his pulse is 79. His respiration is 18 and oxygen saturation is 100%.  Pain Assessment Pain Score: 0-No pain/10 In general this is a well appearing African American male in no acute distress. He's alert and oriented x4 and appropriate throughout the examination. Cardiopulmonary assessment is negative for acute distress and he exhibits normal effort.   Lab Findings: Lab Results  Component Value Date   WBC 6.4 05/30/2017   HGB 12.3 (L) 05/30/2017   HCT 36.2 (L) 05/30/2017   MCV 92.8 05/30/2017   PLT 275 05/30/2017     Radiographic Findings: [place] Core Biopsy (liver)  Result Date: 05/30/2017 CLINICAL DATA:  History of rectal carcinoma with hypermetabolic lesion at the dome of the left lobe of the liver by PET scan suspicious for metastatic disease. The patient presents for biopsy. EXAM: ULTRASOUND GUIDED CORE BIOPSY OF LIVER MEDICATIONS: 1.5 mg IV Versed; 75 mcg IV Fentanyl Total Moderate Sedation Time: 22 minutes. The patient's level of consciousness and physiologic status were continuously monitored during the procedure by Radiology nursing. PROCEDURE: The procedure, risks, benefits, and alternatives were explained to the patient. Questions regarding the procedure were encouraged and answered. The patient understands and consents to the procedure. A time out was performed prior  to initiating the procedure. The epigastric region was prepped with chlorhexidine in a sterile fashion, and a sterile drape was applied covering the operative field. A sterile gown and sterile gloves were used for the procedure. Local anesthesia was provided with 1% Lidocaine. Ultrasound was performed of the liver. Under ultrasound guidance, a 17 gauge needle was advanced into the left lobe of the liver to the level of a lesion in the dome. Three separate coaxial 18 gauge core biopsy samples were obtained and submitted in formalin. COMPLICATIONS: None. FINDINGS:  A relatively hyperechoic lesion is identified in the dome of the left lobe measuring approximately 2.1 cm in greatest diameter. Core biopsy samples were obtained through the region of the lesion. IMPRESSION: Ultrasound-guided core biopsy performed at the level of a lesion in the left lobe near the dome of the liver. Electronically Signed   By: Aletta Edouard M.D.   On: 05/30/2017 15:41    Impression/Plan: 1. Stage III adenocarcinoma of the rectum with suspicious liver lesion on PET that was negative on biopsy. The patient will follow up with CRS and I've ordered an MRI of the pelvis and liver for further clarity of the extent of his disease. We will follow up with him as needed moving forward and he will see Dr. Benay Spice again on 07/04/17.  2. Neuropathy. The patient will start Vitamin B6 OTC and follow up with medical oncology regarding this.       Carola Rhine, PAC

## 2017-06-22 NOTE — Progress Notes (Signed)
[facility] CSW Progress Notes  Referral received from APP Shona Simpson to assist patient w psychosocial needs.  Per patient, he has applied for Medicaid via [employer] ([employer]).  Patient has tried to Designer, industrial/product at [employer], "I have never gotten through to him, but others have told me that they are just waiting for the MCD disability determination."  Completed his own application for Social Security disability "its on the second round and has gone to [place], I am just waiting now."  Has also applied for long term disability via his employer, determination will be based on receipt of medical records from other healthcare facilities.  Patient given contact information for Cancer Legal Resource agency to obtain help in getting needed records provided to disability agency.  Patient also encouraged to attend GI cancer support group.  At this point, patient has applications in process for insurance and disability, must await process to complete.   Provider updated  Edwyna Shell, LCSW Clinical Social Worker Phone:  [phone]

## 2017-06-23 ENCOUNTER — Other Ambulatory Visit: Payer: Self-pay | Admitting: *Deleted

## 2017-06-23 ENCOUNTER — Telehealth: Payer: Self-pay | Admitting: *Deleted

## 2017-06-23 DIAGNOSIS — C20 Malignant neoplasm of rectum: Secondary | ICD-10-CM

## 2017-06-23 NOTE — Telephone Encounter (Signed)
Called patient to inform of MRI on 06-24-17- arrival time - 2:45 pm @ [facility] , pt. to be NPO- 4 hrs. Prior to test, spoke with patient and he is aware of these tests

## 2017-06-24 ENCOUNTER — Ambulatory Visit (HOSPITAL_COMMUNITY): Admission: RE | Admit: 2017-06-24 | Payer: Self-pay | Source: Ambulatory Visit

## 2017-06-24 ENCOUNTER — Ambulatory Visit (HOSPITAL_COMMUNITY): Payer: Self-pay

## 2017-06-26 ENCOUNTER — Ambulatory Visit (HOSPITAL_COMMUNITY)
Admission: RE | Admit: 2017-06-26 | Discharge: 2017-06-26 | Disposition: A | Discharge status: Home / Self Care | Payer: Medicaid Other | Source: Ambulatory Visit | Attending: Radiation Oncology | Admitting: Radiation Oncology

## 2017-06-26 DIAGNOSIS — C20 Malignant neoplasm of rectum: Secondary | ICD-10-CM | POA: Diagnosis not present

## 2017-06-26 DIAGNOSIS — C787 Secondary malignant neoplasm of liver and intrahepatic bile duct: Secondary | ICD-10-CM | POA: Insufficient documentation

## 2017-06-26 MED ORDER — GADOBENATE DIMEGLUMINE 529 MG/ML IV SOLN
20.0000 mL | Freq: Once | INTRAVENOUS | Status: AC | PRN
  Administered 2017-06-26: 16 mL via INTRAVENOUS

## 2017-06-27 ENCOUNTER — Ambulatory Visit: Payer: Self-pay | Admitting: General Surgery

## 2017-06-27 ENCOUNTER — Other Ambulatory Visit (HOSPITAL_COMMUNITY): Payer: Self-pay

## 2017-06-27 NOTE — H&P (Signed)
History of Present Illness Jacob Ruff MD; 07/09/4008 1:44 PM) The patient is a 42 year old male who presents with colorectal cancer. 42yo M who presents to the office ~6 weeks after completing neoadjuvant chemotherapy and radiation. He developed rectal bleeding and abd pain ~1 yr ago. He was found to have a 7cm mass in his rectum. This was noted 6 cm proximal to the anal verge. He was in [place] at the time and was referred to [facility] where it appears a port was placed and chemotherapy was started. The patient had significant pain throughout this process as well as difficulty having bowel movements. He then moved down here to be closer to family. In January of this year he was started on radiation therapy with concurrent chemotherapy. He completed this approximately 6 weeks ago. He is currently having bowel function and minimal pain. He denies any rectal bleeding. He has no history of abdominal surgeries.   Past Surgical History (Tanisha A. Owens Shark, [place]; 06/27/2017 11:53 AM) Colon Polyp Removal - Colonoscopy  Diagnostic Studies History (Tanisha A. Owens Shark, [place]; 06/27/2017 11:53 AM) Colonoscopy 1-5 years ago  Allergies (Tanisha A. Owens Shark, [place]; 06/27/2017 11:54 AM) No Known Drug Allergies [06/27/2017]:  Medication History (Tanisha A. Owens Shark, [place]; 06/27/2017 11:54 AM) No Current Medications Medications Reconciled  Social History (Tanisha A. Owens Shark, [place]; 06/27/2017 11:53 AM) Alcohol use Occasional alcohol use. Caffeine use Carbonated beverages, Coffee, Tea. Illicit drug use Prefer to discuss with provider. Tobacco use Current some day smoker.  Family History (Tanisha A. Owens Shark, [place]; 06/27/2017 11:53 AM) Alcohol Abuse Father. Cancer Family Members In General. Colon Cancer Family Members In General. Colon Polyps Family Members In General. Depression Family Members In General. Diabetes Mellitus Mother. Hypertension Father,  Mother. Malignant Neoplasm Of Pancreas Family Members In General. Melanoma Sister. Rectal Cancer Family Members In General.  Other Problems (Tanisha A. Owens Shark, [place]; 06/27/2017 11:53 AM) Back Pain Depression Gastroesophageal Reflux Disease Pancreatitis Rectal Cancer     Review of Systems (Tanisha A. Brown RMA; 06/27/2017 11:53 AM) General Present- Weight Gain. Not Present- Appetite Loss, Chills, Fatigue, Fever, Night Sweats and Weight Loss. Skin Present- Dryness. Not Present- Change in Wart/Mole, Hives, Jaundice, New Lesions, Non-Healing Wounds, Rash and Ulcer. HEENT Present- Hearing Loss. Not Present- Earache, Hoarseness, Nose Bleed, Oral Ulcers, Ringing in the Ears, Seasonal Allergies, Sinus Pain, Sore Throat, Visual Disturbances, Wears glasses/contact lenses and Yellow Eyes. Respiratory Not Present- Bloody sputum, Chronic Cough, Difficulty Breathing, Snoring and Wheezing. Cardiovascular Not Present- Chest Pain, Difficulty Breathing Lying Down, Leg Cramps, Palpitations, Rapid Heart Rate, Shortness of Breath and Swelling of Extremities. Gastrointestinal Present- Change in Bowel Habits and Constipation. Not Present- Abdominal Pain, Bloating, Bloody Stool, Chronic diarrhea, Difficulty Swallowing, Excessive gas, Gets full quickly at meals, Hemorrhoids, Indigestion, Nausea, Rectal Pain and Vomiting. Male Genitourinary Not Present- Blood in Urine, Change in Urinary Stream, Frequency, Impotence, Nocturia, Painful Urination, Urgency and Urine Leakage. Musculoskeletal Present- Back Pain. Not Present- Joint Pain, Joint Stiffness, Muscle Pain, Muscle Weakness and Swelling of Extremities. Neurological Present- Tingling. Not Present- Decreased Memory, Fainting, Headaches, Numbness, Seizures, Tremor, Trouble walking and Weakness. Psychiatric Present- Bipolar, Change in Sleep Pattern and Depression. Not Present- Anxiety, Fearful and Frequent crying.  Vitals (Tanisha A. Brown RMA; 06/27/2017 11:54  AM) 06/27/2017 11:53 AM Weight: 164.4 lb Height: 74in Body Surface Area: 2 m Body Mass Index: 21.11 kg/m  Temp.: 97.29F  Pulse: 92 (Regular)  BP: 118/74 (Sitting, Left Arm, Standard)  Physical Exam Jacob Ruff MD; 9/32/6712 1:46 PM)  General Mental Status-Alert. General Appearance-Not in acute distress. Build & Nutrition-Well nourished. Posture-Normal posture. Gait-Normal.  Head and Neck Head-normocephalic, atraumatic with no lesions or palpable masses. Trachea-midline.  Chest and Lung Exam Chest and lung exam reveals -on auscultation, normal breath sounds, no adventitious sounds and normal vocal resonance.  Cardiovascular Cardiovascular examination reveals -normal heart sounds, regular rate and rhythm with no murmurs and no digital clubbing, cyanosis, edema, increased warmth or tenderness.  Abdomen Inspection Inspection of the abdomen reveals - No Hernias. Palpation/Percussion Palpation and Percussion of the abdomen reveal - Soft, Non Tender, No Rigidity (guarding), No hepatosplenomegaly and No Palpable abdominal masses.  Neurologic Neurologic evaluation reveals -alert and oriented x 3 with no impairment of recent or remote memory, normal attention span and ability to concentrate, normal sensation and normal coordination.  Musculoskeletal Normal Exam - Bilateral-Upper Extremity Strength Normal and Lower Extremity Strength Normal.    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 He is here today to discuss surgery. He is scheduled to undergo an MRI of his pelvis tomorrow. We will use this for surgical planning. We have discussed this in detail including need for diverting ileostomy and change in postoperative bowel function. All questions were answered. He has a copy of his  colonoscopy report at home and I asked him to drop this by the office so I can evaluate if the area is tattooed. The surgery and anatomy were described to the patient as well as the risks of surgery and the possible complications. These include: Bleeding, deep abdominal infections and possible wound complications such as hernia and infection, damage to adjacent structures, leak of surgical connections, which can lead to other surgeries and possibly an ostomy, possible need for other procedures, such as abscess drains in radiology, possible prolonged hospital stay, possible diarrhea from removal of part of the colon, possible constipation from narcotics, possible bowel, bladder or sexual dysfunction if having rectal surgery, prolonged fatigue/weakness or appetite loss, possible early recurrence of of disease, possible complications of their medical problems such as heart disease or arrhythmias or lung problems, death (less than 1%). I believe the patient understands and wishes to proceed with the surgery.

## 2017-06-27 NOTE — H&P (View-Only) (Signed)
History of Present Illness Jacob Ruff MD; 9/37/9024 1:44 PM) The patient is a 42 year old male who presents with colorectal cancer. 42yo M who presents to the office ~6 weeks after completing neoadjuvant chemotherapy and radiation. He developed rectal bleeding and abd pain ~1 yr ago. He was found to have a 7cm mass in his rectum. This was noted 6 cm proximal to the anal verge. He was in [place] at the time and was referred to [facility] where it appears a port was placed and chemotherapy was started. The patient had significant pain throughout this process as well as difficulty having bowel movements. He then moved down here to be closer to family. In January of this year he was started on radiation therapy with concurrent chemotherapy. He completed this approximately 6 weeks ago. He is currently having bowel function and minimal pain. He denies any rectal bleeding. He has no history of abdominal surgeries.   Past Surgical History (Tanisha A. Owens Shark, [place]; 06/27/2017 11:53 AM) Colon Polyp Removal - Colonoscopy  Diagnostic Studies History (Tanisha A. Owens Shark, [place]; 06/27/2017 11:53 AM) Colonoscopy 1-5 years ago  Allergies (Tanisha A. Owens Shark, [place]; 06/27/2017 11:54 AM) No Known Drug Allergies [06/27/2017]:  Medication History (Tanisha A. Owens Shark, [place]; 06/27/2017 11:54 AM) No Current Medications Medications Reconciled  Social History (Tanisha A. Owens Shark, [place]; 06/27/2017 11:53 AM) Alcohol use Occasional alcohol use. Caffeine use Carbonated beverages, Coffee, Tea. Illicit drug use Prefer to discuss with provider. Tobacco use Current some day smoker.  Family History (Tanisha A. Owens Shark, [place]; 06/27/2017 11:53 AM) Alcohol Abuse Father. Cancer Family Members In General. Colon Cancer Family Members In General. Colon Polyps Family Members In General. Depression Family Members In General. Diabetes Mellitus Mother. Hypertension Father,  Mother. Malignant Neoplasm Of Pancreas Family Members In General. Melanoma Sister. Rectal Cancer Family Members In General.  Other Problems (Tanisha A. Owens Shark, [place]; 06/27/2017 11:53 AM) Back Pain Depression Gastroesophageal Reflux Disease Pancreatitis Rectal Cancer     Review of Systems (Tanisha A. Brown RMA; 06/27/2017 11:53 AM) General Present- Weight Gain. Not Present- Appetite Loss, Chills, Fatigue, Fever, Night Sweats and Weight Loss. Skin Present- Dryness. Not Present- Change in Wart/Mole, Hives, Jaundice, New Lesions, Non-Healing Wounds, Rash and Ulcer. HEENT Present- Hearing Loss. Not Present- Earache, Hoarseness, Nose Bleed, Oral Ulcers, Ringing in the Ears, Seasonal Allergies, Sinus Pain, Sore Throat, Visual Disturbances, Wears glasses/contact lenses and Yellow Eyes. Respiratory Not Present- Bloody sputum, Chronic Cough, Difficulty Breathing, Snoring and Wheezing. Cardiovascular Not Present- Chest Pain, Difficulty Breathing Lying Down, Leg Cramps, Palpitations, Rapid Heart Rate, Shortness of Breath and Swelling of Extremities. Gastrointestinal Present- Change in Bowel Habits and Constipation. Not Present- Abdominal Pain, Bloating, Bloody Stool, Chronic diarrhea, Difficulty Swallowing, Excessive gas, Gets full quickly at meals, Hemorrhoids, Indigestion, Nausea, Rectal Pain and Vomiting. Male Genitourinary Not Present- Blood in Urine, Change in Urinary Stream, Frequency, Impotence, Nocturia, Painful Urination, Urgency and Urine Leakage. Musculoskeletal Present- Back Pain. Not Present- Joint Pain, Joint Stiffness, Muscle Pain, Muscle Weakness and Swelling of Extremities. Neurological Present- Tingling. Not Present- Decreased Memory, Fainting, Headaches, Numbness, Seizures, Tremor, Trouble walking and Weakness. Psychiatric Present- Bipolar, Change in Sleep Pattern and Depression. Not Present- Anxiety, Fearful and Frequent crying.  Vitals (Tanisha A. Brown RMA; 06/27/2017 11:54  AM) 06/27/2017 11:53 AM Weight: 164.4 lb Height: 74in Body Surface Area: 2 m Body Mass Index: 21.11 kg/m  Temp.: 97.51F  Pulse: 92 (Regular)  BP: 118/74 (Sitting, Left Arm, Standard)  Physical Exam Jacob Ruff MD; 2/90/2111 1:46 PM)  General Mental Status-Alert. General Appearance-Not in acute distress. Build & Nutrition-Well nourished. Posture-Normal posture. Gait-Normal.  Head and Neck Head-normocephalic, atraumatic with no lesions or palpable masses. Trachea-midline.  Chest and Lung Exam Chest and lung exam reveals -on auscultation, normal breath sounds, no adventitious sounds and normal vocal resonance.  Cardiovascular Cardiovascular examination reveals -normal heart sounds, regular rate and rhythm with no murmurs and no digital clubbing, cyanosis, edema, increased warmth or tenderness.  Abdomen Inspection Inspection of the abdomen reveals - No Hernias. Palpation/Percussion Palpation and Percussion of the abdomen reveal - Soft, Non Tender, No Rigidity (guarding), No hepatosplenomegaly and No Palpable abdominal masses.  Neurologic Neurologic evaluation reveals -alert and oriented x 3 with no impairment of recent or remote memory, normal attention span and ability to concentrate, normal sensation and normal coordination.  Musculoskeletal Normal Exam - Bilateral-Upper Extremity Strength Normal and Lower Extremity Strength Normal.    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 He is here today to discuss surgery. He is scheduled to undergo an MRI of his pelvis tomorrow. We will use this for surgical planning. We have discussed this in detail including need for diverting ileostomy and change in postoperative bowel function. All questions were answered. He has a copy of his  colonoscopy report at home and I asked him to drop this by the office so I can evaluate if the area is tattooed. The surgery and anatomy were described to the patient as well as the risks of surgery and the possible complications. These include: Bleeding, deep abdominal infections and possible wound complications such as hernia and infection, damage to adjacent structures, leak of surgical connections, which can lead to other surgeries and possibly an ostomy, possible need for other procedures, such as abscess drains in radiology, possible prolonged hospital stay, possible diarrhea from removal of part of the colon, possible constipation from narcotics, possible bowel, bladder or sexual dysfunction if having rectal surgery, prolonged fatigue/weakness or appetite loss, possible early recurrence of of disease, possible complications of their medical problems such as heart disease or arrhythmias or lung problems, death (less than 1%). I believe the patient understands and wishes to proceed with the surgery.

## 2017-06-28 ENCOUNTER — Other Ambulatory Visit (HOSPITAL_COMMUNITY): Payer: Self-pay

## 2017-06-28 ENCOUNTER — Ambulatory Visit (HOSPITAL_COMMUNITY)
Admission: RE | Admit: 2017-06-28 | Discharge: 2017-06-28 | Disposition: A | Discharge status: Home / Self Care | Payer: Medicaid Other | Source: Ambulatory Visit | Attending: Radiation Oncology | Admitting: Radiation Oncology

## 2017-06-28 DIAGNOSIS — C20 Malignant neoplasm of rectum: Secondary | ICD-10-CM | POA: Diagnosis not present

## 2017-06-28 MED ORDER — GADOBENATE DIMEGLUMINE 529 MG/ML IV SOLN
15.0000 mL | Freq: Once | INTRAVENOUS | Status: AC | PRN
  Administered 2017-06-28: 15 mL via INTRAVENOUS

## 2017-07-04 ENCOUNTER — Ambulatory Visit: Payer: Self-pay

## 2017-07-04 ENCOUNTER — Ambulatory Visit: Payer: Self-pay | Admitting: Oncology

## 2017-07-04 MED ORDER — SODIUM CHLORIDE 0.9% FLUSH
10.0000 mL | INTRAVENOUS | Status: AC | PRN
  Filled 2017-07-04: qty 10

## 2017-07-04 MED ORDER — HEPARIN SOD (PORK) LOCK FLUSH 100 UNIT/ML IV SOLN
500.0000 [IU] | Freq: Once | INTRAVENOUS | Status: AC | PRN
  Filled 2017-07-04: qty 5

## 2017-07-11 NOTE — Patient Instructions (Signed)
Jacob Galvan.  07/11/2017   Your procedure is scheduled on: Thursday 07-20-17  Report to [facility]   ARRIVE AT 79 AM. Have a seat in the Main Lobby. Please note there is a phone at the [employer]. Please call [phone] on that phone. Someone from Short Stay will come and get you from the Main Lobby and take you to Short Stay.  Call this number if you have problems the morning of surgery [phone]   Remember: Do not eat food :After Midnight.  DRINK 2 PRESURGERY ENSURE DRINKS THE NIGHT BEFORE SURGERY AT  1000 PM AND 1 PRESURGERY DRINK THE DAY OF THE PROCEDURE 3 HOURS PRIOR TO SCHEDULED SURGERY. NO SOLIDS AFTER MIDNIGHT THE DAY PRIOR TO THE SURGERY. NOTHING BY MOUTH EXCEPT CLEAR LIQUIDS UNTIL THREE HOURS PRIOR TO SCHEDULED SURGERY. PLEASE FINISH PRESURGERY ENSURE DRINK PER SURGEON ORDER 3 HOURS PRIOR TO SCHEDULED SURGERY TIME WHICH NEEDS TO BE COMPLETED AT 445 AM.    CLEAR LIQUID DIET   Foods Allowed                                                                     Foods Excluded  Coffee and tea, regular and decaf                             liquids that you cannot  Plain Jell-O in any flavor                                             see through such as: Fruit ices (not with fruit pulp)                                     milk, soups, orange juice  Iced Popsicles                                    All solid food Carbonated beverages, regular and diet                                    Cranberry, grape and apple juices Sports drinks like Gatorade Lightly seasoned clear broth or consume(fat free) Sugar, honey syrup  Sample Menu Breakfast                                Lunch                                     Supper Cranberry juice                    Beef broth  Chicken broth Jell-O                                     Grape juice                           Apple juice Coffee or tea                         Jell-O                                      Popsicle                                                Coffee or tea                        Coffee or tea  _____________________________________________________________________  [facility] - Preparing for Surgery Before surgery, you can play an important role.  Because skin is not sterile, your skin needs to be as free of germs as possible.  You can reduce the number of germs on your skin by washing with CHG (chlorahexidine gluconate) soap before surgery.  CHG is an antiseptic cleaner which kills germs and bonds with the skin to continue killing germs even after washing. Please DO NOT use if you have an allergy to CHG or antibacterial soaps.  If your skin becomes reddened/irritated stop using the CHG and inform your nurse when you arrive at Short Stay. Do not shave (including legs and underarms) for at least 48 hours prior to the first CHG shower.  You may shave your face/neck. Please follow these instructions carefully:  1.  Shower with CHG Soap the night before surgery and the  morning of Surgery.  2.  If you choose to wash your hair, wash your hair first as usual with your  normal  shampoo.  3.  After you shampoo, rinse your hair and body thoroughly to remove the  shampoo.                           4.  Use CHG as you would any other liquid soap.  You can apply chg directly  to the skin and wash                       Gently with a scrungie or clean washcloth.  5.  Apply the CHG Soap to your body ONLY FROM THE NECK DOWN.   Do not use on face/ open                           Wound or open sores. Avoid contact with eyes, ears mouth and genitals (private parts).                       Wash face,  Genitals (private parts) with your normal soap.             6.  Wash thoroughly, paying special attention to the area  where your surgery  will be performed.  7.  Thoroughly rinse your body with warm water from the neck down.  8.  DO NOT shower/wash with  your normal soap after using and rinsing off  the CHG Soap.                9.  Pat yourself dry with a clean towel.            10.  Wear clean pajamas.            11.  Place clean sheets on your bed the night of your first shower and do not  sleep with pets. Day of Surgery : Do not apply any lotions/deodorants the morning of surgery.  Please wear clean clothes to the hospital/surgery center.  FAILURE TO FOLLOW THESE INSTRUCTIONS MAY RESULT IN THE CANCELLATION OF YOUR SURGERY PATIENT SIGNATURE_________________________________  NURSE SIGNATURE__________________________________  ________________________________________________________________________   Jacob Galvan  An incentive spirometer is a tool that can help keep your lungs clear and active. This tool measures how well you are filling your lungs with each breath. Taking long deep breaths may help reverse or decrease the chance of developing breathing (pulmonary) problems (especially infection) following:  A long period of time when you are unable to move or be active. BEFORE THE PROCEDURE   If the spirometer includes an indicator to show your best effort, your nurse or respiratory therapist will set it to a desired goal.  If possible, sit up straight or lean slightly forward. Try not to slouch.  Hold the incentive spirometer in an upright position. INSTRUCTIONS FOR USE  1. Sit on the edge of your bed if possible, or sit up as far as you can in bed or on a chair. 2. Hold the incentive spirometer in an upright position. 3. Breathe out normally. 4. Place the mouthpiece in your mouth and seal your lips tightly around it. 5. Breathe in slowly and as deeply as possible, raising the piston or the ball toward the top of the column. 6. Hold your breath for 3-5 seconds or for as long as possible. Allow the piston or ball to fall to the bottom of the column. 7. Remove the mouthpiece from your mouth and breathe out normally. 8. Rest  for a few seconds and repeat Steps 1 through 7 at least 10 times every 1-2 hours when you are awake. Take your time and take a few normal breaths between deep breaths. 9. The spirometer may include an indicator to show your best effort. Use the indicator as a goal to work toward during each repetition. 10. After each set of 10 deep breaths, practice coughing to be sure your lungs are clear. If you have an incision (the cut made at the time of surgery), support your incision when coughing by placing a pillow or rolled up towels firmly against it. Once you are able to get out of bed, walk around indoors and cough well. You may stop using the incentive spirometer when instructed by your caregiver.  RISKS AND COMPLICATIONS  Take your time so you do not get dizzy or light-headed.  If you are in pain, you may need to take or ask for pain medication before doing incentive spirometry. It is harder to take a deep breath if you are having pain. AFTER USE  Rest and breathe slowly and easily.  It can be helpful to keep track of a log of your progress. Your caregiver can provide you with a simple  table to help with this. If you are using the spirometer at home, follow these instructions: [place] IF:   You are having difficultly using the spirometer.  You have trouble using the spirometer as often as instructed.  Your pain medication is not giving enough relief while using the spirometer.  You develop fever of 100.5 F (38.1 C) or higher. SEEK IMMEDIATE MEDICAL CARE IF:   You cough up bloody sputum that had not been present before.  You develop fever of 102 F (38.9 C) or greater.  You develop worsening pain at or near the incision site. MAKE SURE YOU:   Understand these instructions.  Will watch your condition.  Will get help right away if you are not doing well or get worse. Document Released: 08/15/2006 Document Revised: 06/27/2011 Document Reviewed: 10/16/2006 ExitCare  Patient Information 2014 ExitCare, [place].   ________________________________________________________________________  WHAT IS A BLOOD TRANSFUSION? Blood Transfusion Information  A transfusion is the replacement of blood or some of its parts. Blood is made up of multiple cells which provide different functions.  Red blood cells carry oxygen and are used for blood loss replacement.  White blood cells fight against infection.  Platelets control bleeding.  Plasma helps clot blood.  Other blood products are available for specialized needs, such as hemophilia or other clotting disorders. BEFORE THE TRANSFUSION  Who gives blood for transfusions?   Healthy volunteers who are fully evaluated to make sure their blood is safe. This is blood bank blood. Transfusion therapy is the safest it has ever been in the practice of medicine. Before blood is taken from a donor, a complete history is taken to make sure that person has no history of diseases nor engages in risky social behavior (examples are intravenous drug use or sexual activity with multiple partners). The donor's travel history is screened to minimize risk of transmitting infections, such as malaria. The donated blood is tested for signs of infectious diseases, such as HIV and hepatitis. The blood is then tested to be sure it is compatible with you in order to minimize the chance of a transfusion reaction. If you or a relative donates blood, this is often done in anticipation of surgery and is not appropriate for emergency situations. It takes many days to process the donated blood. RISKS AND COMPLICATIONS Although transfusion therapy is very safe and saves many lives, the main dangers of transfusion include:   Getting an infectious disease.  Developing a transfusion reaction. This is an allergic reaction to something in the blood you were given. Every precaution is taken to prevent this. The decision to have a blood transfusion has been  considered carefully by your caregiver before blood is given. Blood is not given unless the benefits outweigh the risks. AFTER THE TRANSFUSION  Right after receiving a blood transfusion, you will usually feel much better and more energetic. This is especially true if your red blood cells have gotten low (anemic). The transfusion raises the level of the red blood cells which carry oxygen, and this usually causes an energy increase.  The nurse administering the transfusion will monitor you carefully for complications. HOME CARE INSTRUCTIONS  No special instructions are needed after a transfusion. You may find your energy is better. Speak with your caregiver about any limitations on activity for underlying diseases you may have. SEEK MEDICAL CARE IF:   Your condition is not improving after your transfusion.  You develop redness or irritation at the intravenous (IV) site. [facility]  CARE IF:  Any of the following symptoms occur over the next 12 hours:  Shaking chills.  You have a temperature by mouth above 102 F (38.9 C), not controlled by medicine.  Chest, back, or muscle pain.  People around you feel you are not acting correctly or are confused.  Shortness of breath or difficulty breathing.  Dizziness and fainting.  You get a rash or develop hives.  You have a decrease in urine output.  Your urine turns a dark color or changes to pink, red, or brown. Any of the following symptoms occur over the next 10 days:  You have a temperature by mouth above 102 F (38.9 C), not controlled by medicine.  Shortness of breath.  Weakness after normal activity.  The white part of the eye turns yellow (jaundice).  You have a decrease in the amount of urine or are urinating less often.  Your urine turns a dark color or changes to pink, red, or brown. Document Released: 04/01/2000 Document Revised: 06/27/2011 Document Reviewed: 11/19/2007 ExitCare Patient Information 2014  ExitCare, [place].  _______________________________________________________________________   Take these medicines the morning of surgery with A SIP OF WATER: NONE             You may not have any metal on your body including hair pins and              piercings  Do not wear jewelry, make-up, lotions, powders or perfumes, deodorant             Do not wear nail polish.  Do not shave  48 hours prior to surgery.              Men may shave face and neck.   Do not bring valuables to the hospital. [place].  Contacts, dentures or bridgework may not be worn into surgery.  Leave suitcase in the car. After surgery it may be brought to your room.                  Please read over the following fact sheets you were given: _____________________________________________________________________

## 2017-07-14 MED FILL — metroNIDAZOLE 500 MG TABS: 500 | 1 days supply | Qty: 6 | Fill #0

## 2017-07-14 MED FILL — NEOMYCIN 500 MG TABLET: 500 | 1 days supply | Qty: 6 | Fill #0

## 2017-07-17 ENCOUNTER — Inpatient Hospital Stay (HOSPITAL_COMMUNITY)
Admission: RE | Admit: 2017-07-17 | Discharge: 2017-07-17 | Disposition: A | Discharge status: Home / Self Care | Payer: Self-pay | Source: Ambulatory Visit

## 2017-07-17 ENCOUNTER — Telehealth: Payer: Self-pay | Admitting: Oncology

## 2017-07-17 NOTE — Telephone Encounter (Signed)
No answer - unable to leave message per 3/29 sch message - sent reminder letter in the mail with appt date and time.

## 2017-07-18 ENCOUNTER — Encounter (HOSPITAL_COMMUNITY): Payer: Self-pay

## 2017-07-18 ENCOUNTER — Encounter (HOSPITAL_COMMUNITY)
Admission: RE | Admit: 2017-07-18 | Discharge: 2017-07-18 | Disposition: A | Discharge status: Home / Self Care | Payer: Medicaid Other | Source: Ambulatory Visit | Attending: General Surgery | Admitting: General Surgery

## 2017-07-18 ENCOUNTER — Other Ambulatory Visit: Payer: Self-pay

## 2017-07-18 DIAGNOSIS — Z01818 Encounter for other preprocedural examination: Secondary | ICD-10-CM | POA: Insufficient documentation

## 2017-07-18 DIAGNOSIS — C20 Malignant neoplasm of rectum: Secondary | ICD-10-CM | POA: Insufficient documentation

## 2017-07-18 HISTORY — DX: Headache, unspecified: R51.9

## 2017-07-18 HISTORY — DX: Headache: R51

## 2017-07-18 LAB — CBC
HEMATOCRIT: 42.6 % (ref 39.0–52.0)
Hemoglobin: 14.2 g/dL (ref 13.0–17.0)
MCH: 31.2 pg (ref 26.0–34.0)
MCHC: 33.3 g/dL (ref 30.0–36.0)
MCV: 93.6 fL (ref 78.0–100.0)
PLATELETS: 307 10*3/uL (ref 150–400)
RBC: 4.55 MIL/uL (ref 4.22–5.81)
RDW: 13.7 % (ref 11.5–15.5)
WBC: 8.2 10*3/uL (ref 4.0–10.5)

## 2017-07-18 LAB — BASIC METABOLIC PANEL
Anion gap: 8 (ref 5–15)
BUN: 8 mg/dL (ref 6–20)
CALCIUM: 8.8 mg/dL — AB (ref 8.9–10.3)
CO2: 26 mmol/L (ref 22–32)
CREATININE: 0.81 mg/dL (ref 0.61–1.24)
Chloride: 104 mmol/L (ref 101–111)
GFR calc Af Amer: >60 mL/min (ref >60)
GLUCOSE: 110 mg/dL — AB (ref 65–99)
POTASSIUM: 3.6 mmol/L (ref 3.5–5.1)
SODIUM: 138 mmol/L (ref 135–145)

## 2017-07-18 LAB — ABO/RH: ABO/RH(D): O POS

## 2017-07-18 NOTE — Consult Note (Addendum)
[facility] Nurse ostomy consult note Discussed surgical procedure and stoma creation with patient.  Explained role of the [facility] nurse team.  Provided the patient with educational booklet and provided samples of pouching options.  Answered patient questions to his expressed satisfaction.   Examined patient sitting, and standing in order to place the marking in the patient's visual field, away from any creases or abdominal contour issues and within the rectus muscle.  Attempted to mark above the patient's belt line.   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  Patient's abdomen cleansed with CHG wipes at site markings, allowed to air dry prior to marking.Covered mark with thin film transparent dressing to preserve mark until date of surgery.   [facility] Nurse team will follow up with patient after surgery for continue ostomy care and teaching.   Val Riles, RN, MSN, CWOCN, CNS-BC, pager [phone]

## 2017-07-18 NOTE — Patient Instructions (Addendum)
Jacob Galvan.  07/18/2017   Your procedure is scheduled on: Thursday 07/20/2017  Report to [facility] Main  Entrance             Report to admitting at  0545  AM   Call this number if you have problems the morning of surgery [phone]               Follow Bowel prep instructions from Dr. Marcello Moores office the day before surgery  (Wednesday 07/19/2017) and a clear liquid diet all day.!   Remember: Do not eat food  :After Midnight on Tuesday 07/18/2017.   DRINK 2 PRESURGERY ENSURE DRINKS THE NIGHT BEFORE SURGERY AT  1000 PM AND 1 PRESURGERY DRINK THE DAY OF THE PROCEDURE 3 HOURS PRIOR TO SCHEDULED SURGERY. NO SOLIDS AFTER MIDNIGHT THE DAY PRIOR TO THE SURGERY. NOTHING BY MOUTH EXCEPT CLEAR LIQUIDS UNTIL THREE HOURS PRIOR TO SCHEDULED SURGERY. PLEASE FINISH PRESURGERY ENSURE DRINK PER SURGEON ORDER 3 HOURS PRIOR TO SCHEDULED SURGERY TIME WHICH NEEDS TO BE COMPLETED AT  0445 am.    CLEAR LIQUID DIET   Foods Allowed                                                                     Foods Excluded  Coffee and tea, regular and decaf                             liquids that you cannot  Plain Jell-O in any flavor                                             see through such as: Fruit ices (not with fruit pulp)                                     milk, soups, orange juice  Iced Popsicles                                    All solid food Carbonated beverages, regular and diet                                    Cranberry, grape and apple juices Sports drinks like Gatorade Lightly seasoned clear broth or consume(fat free) Sugar, honey syrup  Sample Menu Breakfast                                Lunch                                     Supper Cranberry juice  Beef broth                            Chicken broth Jell-O                                     Grape juice                           Apple juice Coffee or tea                        Jell-O                                       Popsicle                                                Coffee or tea                        Coffee or tea  _____________________________________________________________________     Take these medicines the morning of surgery with A SIP OF WATER: none                                 You may not have any metal on your body including hair pins and              piercings  Do not wear jewelry, make-up, lotions, powders or perfumes, deodorant             Men may shave face and neck.   Do not bring valuables to the hospital. [place].  Contacts, dentures or bridgework may not be worn into surgery.  Leave suitcase in the car. After surgery it may be brought to your room.                Please read over the following fact sheets you were given: _____________________________________________________________________             [facility] - Preparing for Surgery Before surgery, you can play an important role.  Because skin is not sterile, your skin needs to be as free of germs as possible.  You can reduce the number of germs on your skin by washing with CHG (chlorahexidine gluconate) soap before surgery.  CHG is an antiseptic cleaner which kills germs and bonds with the skin to continue killing germs even after washing. Please DO NOT use if you have an allergy to CHG or antibacterial soaps.  If your skin becomes reddened/irritated stop using the CHG and inform your nurse when you arrive at Short Stay. Do not shave (including legs and underarms) for at least 48 hours prior to the first CHG shower.  You may shave your face/neck. Please follow these instructions carefully:  1.  Shower with CHG Soap the night before surgery and the  morning of Surgery.  2.  If you choose to  wash your hair, wash your hair first as usual with your  normal  shampoo.  3.  After you shampoo, rinse your hair and body thoroughly to remove  the  shampoo.                           4.  Use CHG as you would any other liquid soap.  You can apply chg directly  to the skin and wash                       Gently with a scrungie or clean washcloth.  5.  Apply the CHG Soap to your body ONLY FROM THE NECK DOWN.   Do not use on face/ open                           Wound or open sores. Avoid contact with eyes, ears mouth and genitals (private parts).                       Wash face,  Genitals (private parts) with your normal soap.             6.  Wash thoroughly, paying special attention to the area where your surgery  will be performed.  7.  Thoroughly rinse your body with warm water from the neck down.  8.  DO NOT shower/wash with your normal soap after using and rinsing off  the CHG Soap.                9.  Pat yourself dry with a clean towel.            10.  Wear clean pajamas.            11.  Place clean sheets on your bed the night of your first shower and do not  sleep with pets. Day of Surgery : Do not apply any lotions/deodorants the morning of surgery.  Please wear clean clothes to the hospital/surgery center.  FAILURE TO FOLLOW THESE INSTRUCTIONS MAY RESULT IN THE CANCELLATION OF YOUR SURGERY PATIENT SIGNATURE_________________________________  NURSE SIGNATURE__________________________________  ________________________________________________________________________   Jacob Galvan  An incentive spirometer is a tool that can help keep your lungs clear and active. This tool measures how well you are filling your lungs with each breath. Taking long deep breaths may help reverse or decrease the chance of developing breathing (pulmonary) problems (especially infection) following:  A long period of time when you are unable to move or be active. BEFORE THE PROCEDURE   If the spirometer includes an indicator to show your best effort, your nurse or respiratory therapist will set it to a desired goal.  If possible, sit up  straight or lean slightly forward. Try not to slouch.  Hold the incentive spirometer in an upright position. INSTRUCTIONS FOR USE  1. Sit on the edge of your bed if possible, or sit up as far as you can in bed or on a chair. 2. Hold the incentive spirometer in an upright position. 3. Breathe out normally. 4. Place the mouthpiece in your mouth and seal your lips tightly around it. 5. Breathe in slowly and as deeply as possible, raising the piston or the ball toward the top of the column. 6. Hold your breath for 3-5 seconds or for as long as possible. Allow the piston or ball to fall to the  bottom of the column. 7. Remove the mouthpiece from your mouth and breathe out normally. 8. Rest for a few seconds and repeat Steps 1 through 7 at least 10 times every 1-2 hours when you are awake. Take your time and take a few normal breaths between deep breaths. 9. The spirometer may include an indicator to show your best effort. Use the indicator as a goal to work toward during each repetition. 10. After each set of 10 deep breaths, practice coughing to be sure your lungs are clear. If you have an incision (the cut made at the time of surgery), support your incision when coughing by placing a pillow or rolled up towels firmly against it. Once you are able to get out of bed, walk around indoors and cough well. You may stop using the incentive spirometer when instructed by your caregiver.  RISKS AND COMPLICATIONS  Take your time so you do not get dizzy or light-headed.  If you are in pain, you may need to take or ask for pain medication before doing incentive spirometry. It is harder to take a deep breath if you are having pain. AFTER USE  Rest and breathe slowly and easily.  It can be helpful to keep track of a log of your progress. Your caregiver can provide you with a simple table to help with this. If you are using the spirometer at home, follow these instructions: [place] IF:   You are  having difficultly using the spirometer.  You have trouble using the spirometer as often as instructed.  Your pain medication is not giving enough relief while using the spirometer.  You develop fever of 100.5 F (38.1 C) or higher. SEEK IMMEDIATE MEDICAL CARE IF:   You cough up bloody sputum that had not been present before.  You develop fever of 102 F (38.9 C) or greater.  You develop worsening pain at or near the incision site. MAKE SURE YOU:   Understand these instructions.  Will watch your condition.  Will get help right away if you are not doing well or get worse. Document Released: 08/15/2006 Document Revised: 06/27/2011 Document Reviewed: 10/16/2006 ExitCare Patient Information 2014 Exit[employer], [place].   ________________________________________________________________________  WHAT IS A BLOOD TRANSFUSION? Blood Transfusion Information  A transfusion is the replacement of blood or some of its parts. Blood is made up of multiple cells which provide different functions.  Red blood cells carry oxygen and are used for blood loss replacement.  White blood cells fight against infection.  Platelets control bleeding.  Plasma helps clot blood.  Other blood products are available for specialized needs, such as hemophilia or other clotting disorders. BEFORE THE TRANSFUSION  Who gives blood for transfusions?   Healthy volunteers who are fully evaluated to make sure their blood is safe. This is blood bank blood. Transfusion therapy is the safest it has ever been in the practice of medicine. Before blood is taken from a donor, a complete history is taken to make sure that person has no history of diseases nor engages in risky social behavior (examples are intravenous drug use or sexual activity with multiple partners). The donor's travel history is screened to minimize risk of transmitting infections, such as malaria. The donated blood is tested for signs of infectious diseases,  such as HIV and hepatitis. The blood is then tested to be sure it is compatible with you in order to minimize the chance of a transfusion reaction. If you or a relative donates blood, this  is often done in anticipation of surgery and is not appropriate for emergency situations. It takes many days to process the donated blood. RISKS AND COMPLICATIONS Although transfusion therapy is very safe and saves many lives, the main dangers of transfusion include:   Getting an infectious disease.  Developing a transfusion reaction. This is an allergic reaction to something in the blood you were given. Every precaution is taken to prevent this. The decision to have a blood transfusion has been considered carefully by your caregiver before blood is given. Blood is not given unless the benefits outweigh the risks. AFTER THE TRANSFUSION  Right after receiving a blood transfusion, you will usually feel much better and more energetic. This is especially true if your red blood cells have gotten low (anemic). The transfusion raises the level of the red blood cells which carry oxygen, and this usually causes an energy increase.  The nurse administering the transfusion will monitor you carefully for complications. HOME CARE INSTRUCTIONS  No special instructions are needed after a transfusion. You may find your energy is better. Speak with your caregiver about any limitations on activity for underlying diseases you may have. SEEK MEDICAL CARE IF:   Your condition is not improving after your transfusion.  You develop redness or irritation at the intravenous (IV) site. SEEK IMMEDIATE MEDICAL CARE IF:  Any of the following symptoms occur over the next 12 hours:  Shaking chills.  You have a temperature by mouth above 102 F (38.9 C), not controlled by medicine.  Chest, back, or muscle pain.  People around you feel you are not acting correctly or are confused.  Shortness of breath or difficulty  breathing.  Dizziness and fainting.  You get a rash or develop hives.  You have a decrease in urine output.  Your urine turns a dark color or changes to pink, red, or brown. Any of the following symptoms occur over the next 10 days:  You have a temperature by mouth above 102 F (38.9 C), not controlled by medicine.  Shortness of breath.  Weakness after normal activity.  The white part of the eye turns yellow (jaundice).  You have a decrease in the amount of urine or are urinating less often.  Your urine turns a dark color or changes to pink, red, or brown. Document Released: 04/01/2000 Document Revised: 06/27/2011 Document Reviewed: 11/19/2007 [facility] Patient Information 2014 [place], [place].  _______________________________________________________________________

## 2017-07-19 MED ORDER — BUPIVACAINE LIPOSOME 1.3 % IJ SUSP
20.0000 mL | INTRAMUSCULAR | Status: DC
  Filled 2017-07-19: qty 20

## 2017-07-19 NOTE — Anesthesia Preprocedure Evaluation (Addendum)
Anesthesia Evaluation  Patient identified by MRN, date of birth, ID band Patient awake    Reviewed: Allergy & Precautions, NPO status , Patient's Chart, lab work & pertinent test results  Airway Mallampati: I  TM Distance: >3 FB Neck ROM: Full    Dental no notable dental hx.    Pulmonary Current Smoker,    Pulmonary exam normal breath sounds clear to auscultation       Cardiovascular Exercise Tolerance: Good negative cardio ROS Normal cardiovascular exam Rhythm:Regular Rate:Normal     Neuro/Psych negative neurological ROS     GI/Hepatic negative GI ROS, Neg liver ROS,   Endo/Other  negative endocrine ROS  Renal/GU negative Renal ROS     Musculoskeletal   Abdominal   Peds  Hematology   Anesthesia Other Findings   Reproductive/Obstetrics                            Anesthesia Physical Anesthesia Plan  ASA: II  Anesthesia Plan: General   Post-op Pain Management:    Induction: Intravenous  PONV Risk Score and Plan: Treatment may vary due to age or medical condition and Ondansetron  Airway Management Planned: Oral ETT  Additional Equipment:   Intra-op Plan:   Post-operative Plan: Extubation in OR  Informed Consent: I have reviewed the patients History and Physical, chart, labs and discussed the procedure including the risks, benefits and alternatives for the proposed anesthesia with the patient or authorized representative who has indicated his/her understanding and acceptance.   Dental advisory given  Plan Discussed with: CRNA  Anesthesia Plan Comments:         Anesthesia Quick Evaluation

## 2017-07-20 ENCOUNTER — Encounter (HOSPITAL_COMMUNITY): Payer: Self-pay | Admitting: Emergency Medicine

## 2017-07-20 ENCOUNTER — Inpatient Hospital Stay (HOSPITAL_COMMUNITY): Payer: Medicaid Other | Admitting: Certified Registered Nurse Anesthetist

## 2017-07-20 ENCOUNTER — Other Ambulatory Visit: Payer: Self-pay

## 2017-07-20 ENCOUNTER — Inpatient Hospital Stay (HOSPITAL_COMMUNITY)
Admission: RE | Admit: 2017-07-20 | Discharge: 2017-08-29 | DRG: 329 | Disposition: A | Discharge status: Home Health | Payer: Medicaid Other | Source: Ambulatory Visit | Attending: General Surgery | Admitting: General Surgery

## 2017-07-20 ENCOUNTER — Encounter (HOSPITAL_COMMUNITY)
Admission: RE | Disposition: A | Discharge status: Home Health | Payer: Self-pay | Source: Ambulatory Visit | Attending: General Surgery

## 2017-07-20 DIAGNOSIS — Z808 Family history of malignant neoplasm of other organs or systems: Secondary | ICD-10-CM | POA: Diagnosis not present

## 2017-07-20 DIAGNOSIS — Z923 Personal history of irradiation: Secondary | ICD-10-CM

## 2017-07-20 DIAGNOSIS — Z932 Ileostomy status: Secondary | ICD-10-CM

## 2017-07-20 DIAGNOSIS — E871 Hypo-osmolality and hyponatremia: Secondary | ICD-10-CM | POA: Diagnosis not present

## 2017-07-20 DIAGNOSIS — Z811 Family history of alcohol abuse and dependence: Secondary | ICD-10-CM | POA: Diagnosis not present

## 2017-07-20 DIAGNOSIS — Y92230 Patient room in hospital as the place of occurrence of the external cause: Secondary | ICD-10-CM | POA: Diagnosis not present

## 2017-07-20 DIAGNOSIS — K9131 Postprocedural partial intestinal obstruction: Secondary | ICD-10-CM | POA: Diagnosis not present

## 2017-07-20 DIAGNOSIS — K219 Gastro-esophageal reflux disease without esophagitis: Secondary | ICD-10-CM | POA: Diagnosis present

## 2017-07-20 DIAGNOSIS — Z9221 Personal history of antineoplastic chemotherapy: Secondary | ICD-10-CM | POA: Diagnosis not present

## 2017-07-20 DIAGNOSIS — C20 Malignant neoplasm of rectum: Secondary | ICD-10-CM | POA: Diagnosis present

## 2017-07-20 DIAGNOSIS — H9201 Otalgia, right ear: Secondary | ICD-10-CM | POA: Diagnosis not present

## 2017-07-20 DIAGNOSIS — Z86010 Personal history of colonic polyps: Secondary | ICD-10-CM | POA: Diagnosis not present

## 2017-07-20 DIAGNOSIS — G893 Neoplasm related pain (acute) (chronic): Secondary | ICD-10-CM | POA: Diagnosis not present

## 2017-07-20 DIAGNOSIS — R066 Hiccough: Secondary | ICD-10-CM | POA: Diagnosis not present

## 2017-07-20 DIAGNOSIS — Z8659 Personal history of other mental and behavioral disorders: Secondary | ICD-10-CM | POA: Diagnosis not present

## 2017-07-20 DIAGNOSIS — Z833 Family history of diabetes mellitus: Secondary | ICD-10-CM

## 2017-07-20 DIAGNOSIS — F17200 Nicotine dependence, unspecified, uncomplicated: Secondary | ICD-10-CM | POA: Diagnosis present

## 2017-07-20 DIAGNOSIS — F329 Major depressive disorder, single episode, unspecified: Secondary | ICD-10-CM | POA: Diagnosis present

## 2017-07-20 DIAGNOSIS — M7989 Other specified soft tissue disorders: Secondary | ICD-10-CM | POA: Diagnosis not present

## 2017-07-20 DIAGNOSIS — Z79899 Other long term (current) drug therapy: Secondary | ICD-10-CM

## 2017-07-20 DIAGNOSIS — Z791 Long term (current) use of non-steroidal anti-inflammatories (NSAID): Secondary | ICD-10-CM

## 2017-07-20 DIAGNOSIS — E440 Moderate protein-calorie malnutrition: Secondary | ICD-10-CM

## 2017-07-20 DIAGNOSIS — E878 Other disorders of electrolyte and fluid balance, not elsewhere classified: Secondary | ICD-10-CM | POA: Diagnosis not present

## 2017-07-20 DIAGNOSIS — K56600 Partial intestinal obstruction, unspecified as to cause: Secondary | ICD-10-CM | POA: Diagnosis present

## 2017-07-20 DIAGNOSIS — Z681 Body mass index (BMI) 19 or less, adult: Secondary | ICD-10-CM | POA: Diagnosis not present

## 2017-07-20 DIAGNOSIS — Z95828 Presence of other vascular implants and grafts: Secondary | ICD-10-CM

## 2017-07-20 DIAGNOSIS — K5903 Drug induced constipation: Secondary | ICD-10-CM | POA: Diagnosis not present

## 2017-07-20 DIAGNOSIS — R590 Localized enlarged lymph nodes: Secondary | ICD-10-CM | POA: Diagnosis present

## 2017-07-20 DIAGNOSIS — C787 Secondary malignant neoplasm of liver and intrahepatic bile duct: Secondary | ICD-10-CM | POA: Diagnosis present

## 2017-07-20 DIAGNOSIS — Z8249 Family history of ischemic heart disease and other diseases of the circulatory system: Secondary | ICD-10-CM | POA: Diagnosis not present

## 2017-07-20 DIAGNOSIS — Z0189 Encounter for other specified special examinations: Secondary | ICD-10-CM

## 2017-07-20 DIAGNOSIS — C19 Malignant neoplasm of rectosigmoid junction: Principal | ICD-10-CM | POA: Diagnosis present

## 2017-07-20 DIAGNOSIS — E860 Dehydration: Secondary | ICD-10-CM | POA: Diagnosis not present

## 2017-07-20 DIAGNOSIS — T40605A Adverse effect of unspecified narcotics, initial encounter: Secondary | ICD-10-CM | POA: Diagnosis not present

## 2017-07-20 DIAGNOSIS — Z980 Intestinal bypass and anastomosis status: Secondary | ICD-10-CM | POA: Diagnosis not present

## 2017-07-20 DIAGNOSIS — E876 Hypokalemia: Secondary | ICD-10-CM | POA: Diagnosis not present

## 2017-07-20 DIAGNOSIS — Z800 Family history of malignant neoplasm of digestive organs: Secondary | ICD-10-CM | POA: Diagnosis not present

## 2017-07-20 DIAGNOSIS — K651 Peritoneal abscess: Secondary | ICD-10-CM

## 2017-07-20 DIAGNOSIS — E43 Unspecified severe protein-calorie malnutrition: Secondary | ICD-10-CM | POA: Diagnosis present

## 2017-07-20 DIAGNOSIS — G8918 Other acute postprocedural pain: Secondary | ICD-10-CM | POA: Diagnosis not present

## 2017-07-20 DIAGNOSIS — K567 Ileus, unspecified: Secondary | ICD-10-CM

## 2017-07-20 DIAGNOSIS — G5790 Unspecified mononeuropathy of unspecified lower limb: Secondary | ICD-10-CM | POA: Diagnosis not present

## 2017-07-20 DIAGNOSIS — K9189 Other postprocedural complications and disorders of digestive system: Secondary | ICD-10-CM

## 2017-07-20 DIAGNOSIS — K56609 Unspecified intestinal obstruction, unspecified as to partial versus complete obstruction: Secondary | ICD-10-CM

## 2017-07-20 HISTORY — PX: LAPAROSCOPIC DIVERTED COLOSTOMY: SHX5892

## 2017-07-20 HISTORY — PX: XI ROBOTIC ASSISTED LOWER ANTERIOR RESECTION: SHX6558

## 2017-07-20 LAB — TYPE AND SCREEN
ABO/RH(D): O POS
ANTIBODY SCREEN: NEGATIVE

## 2017-07-20 SURGERY — RESECTION, RECTUM, LOW ANTERIOR, ROBOT-ASSISTED
Anesthesia: General | Site: Abdomen

## 2017-07-20 MED ORDER — ENOXAPARIN SODIUM 40 MG/0.4ML ~~LOC~~ SOLN
40.0000 mg | SUBCUTANEOUS | Status: DC
  Administered 2017-07-21 – 2017-07-30 (×10): 40 mg via SUBCUTANEOUS
  Filled 2017-07-20 (×10): qty 0.4

## 2017-07-20 MED ORDER — HYDROMORPHONE HCL 1 MG/ML IJ SOLN
INTRAMUSCULAR | Status: AC
  Filled 2017-07-20: qty 1

## 2017-07-20 MED ORDER — DEXAMETHASONE SODIUM PHOSPHATE 10 MG/ML IJ SOLN
INTRAMUSCULAR | Status: AC
  Filled 2017-07-20: qty 1

## 2017-07-20 MED ORDER — BUPIVACAINE LIPOSOME 1.3 % IJ SUSP
INTRAMUSCULAR | Status: DC | PRN
  Administered 2017-07-20: 20 mL

## 2017-07-20 MED ORDER — ONDANSETRON HCL 4 MG/2ML IJ SOLN
4.0000 mg | Freq: Four times a day (QID) | INTRAMUSCULAR | Status: DC | PRN
  Administered 2017-07-20 – 2017-08-29 (×63): 4 mg via INTRAVENOUS
  Filled 2017-07-20 (×63): qty 2

## 2017-07-20 MED ORDER — HYDROMORPHONE HCL 1 MG/ML IJ SOLN
0.5000 mg | INTRAMUSCULAR | Status: DC | PRN
  Administered 2017-07-20 – 2017-07-21 (×5): 0.5 mg via INTRAVENOUS
  Filled 2017-07-20 (×5): qty 0.5

## 2017-07-20 MED ORDER — BUPIVACAINE-EPINEPHRINE (PF) 0.5% -1:200000 IJ SOLN
INTRAMUSCULAR | Status: AC
  Filled 2017-07-20: qty 30

## 2017-07-20 MED ORDER — HYDROCODONE-ACETAMINOPHEN 7.5-325 MG PO TABS: 1.0000 | ORAL_TABLET | Freq: Once | ORAL | Status: DC | PRN

## 2017-07-20 MED ORDER — SODIUM CHLORIDE 0.9 % IV SOLN
2.0000 g | Freq: Two times a day (BID) | INTRAVENOUS | Status: DC
  Filled 2017-07-20: qty 2

## 2017-07-20 MED ORDER — SUGAMMADEX SODIUM 200 MG/2ML IV SOLN
INTRAVENOUS | Status: DC | PRN
  Administered 2017-07-20: 200 mg via INTRAVENOUS

## 2017-07-20 MED ORDER — LACTATED RINGERS IV SOLN
INTRAVENOUS | Status: DC | PRN
  Administered 2017-07-20 (×2): via INTRAVENOUS

## 2017-07-20 MED ORDER — LIDOCAINE 2% (20 MG/ML) 5 ML SYRINGE
INTRAMUSCULAR | Status: AC
  Filled 2017-07-20: qty 10

## 2017-07-20 MED ORDER — ACETAMINOPHEN 500 MG PO TABS
1000.0000 mg | ORAL_TABLET | ORAL | Status: AC
  Administered 2017-07-20: 1000 mg via ORAL
  Filled 2017-07-20: qty 2

## 2017-07-20 MED ORDER — GABAPENTIN 300 MG PO CAPS
300.0000 mg | ORAL_CAPSULE | ORAL | Status: AC
  Administered 2017-07-20: 300 mg via ORAL
  Filled 2017-07-20: qty 1

## 2017-07-20 MED ORDER — ONDANSETRON HCL 4 MG PO TABS
4.0000 mg | ORAL_TABLET | Freq: Four times a day (QID) | ORAL | Status: DC | PRN
Start: 2017-07-20 — End: 2017-08-29
  Administered 2017-08-08: 4 mg via ORAL
  Filled 2017-07-20: qty 1

## 2017-07-20 MED ORDER — ONDANSETRON HCL 4 MG/2ML IJ SOLN
INTRAMUSCULAR | Status: DC | PRN
  Administered 2017-07-20: 4 mg via INTRAVENOUS

## 2017-07-20 MED ORDER — BUPIVACAINE-EPINEPHRINE (PF) 0.5% -1:200000 IJ SOLN
INTRAMUSCULAR | Status: DC | PRN
  Administered 2017-07-20: 30 mL

## 2017-07-20 MED ORDER — ACETAMINOPHEN 10 MG/ML IV SOLN: 1000.0000 mg | Freq: Once | INTRAVENOUS | Status: DC | PRN

## 2017-07-20 MED ORDER — CEFOTETAN DISODIUM-DEXTROSE 2-2.08 GM-%(50ML) IV SOLR
2.0000 g | Freq: Two times a day (BID) | INTRAVENOUS | Status: AC
  Administered 2017-07-20: 2 g via INTRAVENOUS
  Filled 2017-07-20: qty 50

## 2017-07-20 MED ORDER — ALUM & MAG HYDROXIDE-SIMETH 200-200-20 MG/5ML PO SUSP
30.0000 mL | Freq: Four times a day (QID) | ORAL | Status: DC | PRN
  Administered 2017-07-22 – 2017-08-22 (×6): 30 mL via ORAL
  Filled 2017-07-20 (×6): qty 30

## 2017-07-20 MED ORDER — DIPHENHYDRAMINE HCL 50 MG/ML IJ SOLN: 12.5000 mg | Freq: Four times a day (QID) | INTRAMUSCULAR | Status: DC | PRN

## 2017-07-20 MED ORDER — LIDOCAINE HCL 2 % IJ SOLN
INTRAMUSCULAR | Status: AC
  Filled 2017-07-20: qty 20

## 2017-07-20 MED ORDER — FENTANYL CITRATE (PF) 250 MCG/5ML IJ SOLN
INTRAMUSCULAR | Status: AC
  Filled 2017-07-20: qty 5

## 2017-07-20 MED ORDER — ALVIMOPAN 12 MG PO CAPS
12.0000 mg | ORAL_CAPSULE | ORAL | Status: AC
  Administered 2017-07-20: 12 mg via ORAL
  Filled 2017-07-20: qty 1

## 2017-07-20 MED ORDER — CELECOXIB 200 MG PO CAPS
200.0000 mg | ORAL_CAPSULE | ORAL | Status: AC
  Administered 2017-07-20: 200 mg via ORAL
  Filled 2017-07-20: qty 1

## 2017-07-20 MED ORDER — ARTIFICIAL TEARS OPHTHALMIC OINT
TOPICAL_OINTMENT | OPHTHALMIC | Status: DC | PRN
  Administered 2017-07-20: 1 via OPHTHALMIC

## 2017-07-20 MED ORDER — FENTANYL CITRATE (PF) 100 MCG/2ML IJ SOLN
INTRAMUSCULAR | Status: AC
  Filled 2017-07-20: qty 2

## 2017-07-20 MED ORDER — LIP MEDEX EX OINT
TOPICAL_OINTMENT | CUTANEOUS | Status: AC
  Administered 2017-07-20: 15:00:00
  Filled 2017-07-20: qty 7

## 2017-07-20 MED ORDER — KETAMINE HCL 10 MG/ML IJ SOLN
INTRAMUSCULAR | Status: DC | PRN
  Administered 2017-07-20: 10 mg via INTRAVENOUS
  Administered 2017-07-20: 30 mg via INTRAVENOUS

## 2017-07-20 MED ORDER — KCL IN DEXTROSE-NACL 20-5-0.45 MEQ/L-%-% IV SOLN
INTRAVENOUS | Status: DC
  Administered 2017-07-20 – 2017-07-24 (×6): via INTRAVENOUS
  Filled 2017-07-20 (×7): qty 1000

## 2017-07-20 MED ORDER — KETAMINE HCL 10 MG/ML IJ SOLN
INTRAMUSCULAR | Status: AC
  Filled 2017-07-20: qty 1

## 2017-07-20 MED ORDER — DIPHENHYDRAMINE HCL 12.5 MG/5ML PO ELIX
12.5000 mg | ORAL_SOLUTION | Freq: Four times a day (QID) | ORAL | Status: DC | PRN
  Filled 2017-07-20: qty 5

## 2017-07-20 MED ORDER — DEXAMETHASONE SODIUM PHOSPHATE 10 MG/ML IJ SOLN
INTRAMUSCULAR | Status: DC | PRN
  Administered 2017-07-20: 10 mg via INTRAVENOUS

## 2017-07-20 MED ORDER — BUPIVACAINE-EPINEPHRINE 0.25% -1:200000 IJ SOLN: INTRAMUSCULAR | Status: DC | PRN

## 2017-07-20 MED ORDER — MEPERIDINE HCL 50 MG/ML IJ SOLN: 6.2500 mg | INTRAMUSCULAR | Status: DC | PRN

## 2017-07-20 MED ORDER — PROMETHAZINE HCL 25 MG/ML IJ SOLN: 6.2500 mg | INTRAMUSCULAR | Status: DC | PRN

## 2017-07-20 MED ORDER — LIDOCAINE 2% (20 MG/ML) 5 ML SYRINGE
INTRAMUSCULAR | Status: DC | PRN
  Administered 2017-07-20: 1.5 mg/kg/h via INTRAVENOUS

## 2017-07-20 MED ORDER — ONDANSETRON HCL 4 MG/2ML IJ SOLN
INTRAMUSCULAR | Status: AC
  Filled 2017-07-20: qty 2

## 2017-07-20 MED ORDER — MIDAZOLAM HCL 2 MG/2ML IJ SOLN
INTRAMUSCULAR | Status: AC
  Filled 2017-07-20: qty 2

## 2017-07-20 MED ORDER — ALVIMOPAN 12 MG PO CAPS
12.0000 mg | ORAL_CAPSULE | Freq: Two times a day (BID) | ORAL | Status: DC
  Administered 2017-07-21 – 2017-07-22 (×4): 12 mg via ORAL
  Filled 2017-07-20 (×4): qty 1

## 2017-07-20 MED ORDER — GLYCOPYRROLATE 0.2 MG/ML IJ SOLN
INTRAMUSCULAR | Status: DC | PRN
  Administered 2017-07-20: 0.2 mg via INTRAVENOUS

## 2017-07-20 MED ORDER — MIDAZOLAM HCL 5 MG/5ML IJ SOLN
INTRAMUSCULAR | Status: DC | PRN
  Administered 2017-07-20: 2 mg via INTRAVENOUS

## 2017-07-20 MED ORDER — HEPARIN SODIUM (PORCINE) 5000 UNIT/ML IJ SOLN
5000.0000 [IU] | Freq: Once | INTRAMUSCULAR | Status: AC
  Administered 2017-07-20: 5000 [IU] via SUBCUTANEOUS
  Filled 2017-07-20: qty 1

## 2017-07-20 MED ORDER — PROPOFOL 10 MG/ML IV BOLUS
INTRAVENOUS | Status: DC | PRN
  Administered 2017-07-20: 180 mg via INTRAVENOUS

## 2017-07-20 MED ORDER — ROCURONIUM BROMIDE 50 MG/5ML IV SOSY
PREFILLED_SYRINGE | INTRAVENOUS | Status: DC | PRN
  Administered 2017-07-20: 20 mg via INTRAVENOUS
  Administered 2017-07-20: 15 mg via INTRAVENOUS
  Administered 2017-07-20: 20 mg via INTRAVENOUS
  Administered 2017-07-20: 25 mg via INTRAVENOUS
  Administered 2017-07-20: 20 mg via INTRAVENOUS
  Administered 2017-07-20: 40 mg via INTRAVENOUS
  Administered 2017-07-20 (×2): 20 mg via INTRAVENOUS

## 2017-07-20 MED ORDER — ACETAMINOPHEN 500 MG PO TABS
1000.0000 mg | ORAL_TABLET | Freq: Four times a day (QID) | ORAL | Status: AC
  Administered 2017-07-20 – 2017-07-22 (×8): 1000 mg via ORAL
  Filled 2017-07-20 (×15): qty 2

## 2017-07-20 MED ORDER — PROPOFOL 10 MG/ML IV BOLUS
INTRAVENOUS | Status: AC
  Filled 2017-07-20: qty 20

## 2017-07-20 MED ORDER — FENTANYL CITRATE (PF) 100 MCG/2ML IJ SOLN
INTRAMUSCULAR | Status: DC | PRN
  Administered 2017-07-20 (×2): 25 ug via INTRAVENOUS
  Administered 2017-07-20 (×8): 50 ug via INTRAVENOUS

## 2017-07-20 MED ORDER — CEFOTETAN DISODIUM-DEXTROSE 2-2.08 GM-%(50ML) IV SOLR
2.0000 g | INTRAVENOUS | Status: AC
  Administered 2017-07-20: 2 g via INTRAVENOUS
  Filled 2017-07-20: qty 50

## 2017-07-20 MED ORDER — HYDROMORPHONE HCL 1 MG/ML IJ SOLN
0.2500 mg | INTRAMUSCULAR | Status: DC | PRN
  Administered 2017-07-20 (×2): 0.5 mg via INTRAVENOUS

## 2017-07-20 MED ORDER — SODIUM CHLORIDE 0.9 % IR SOLN
Status: DC | PRN
  Administered 2017-07-20: 2000 mL

## 2017-07-20 MED ORDER — ESMOLOL HCL 100 MG/10ML IV SOLN
INTRAVENOUS | Status: DC | PRN
  Administered 2017-07-20: 20 mg via INTRAVENOUS

## 2017-07-20 MED ORDER — LACTATED RINGERS IR SOLN
Status: DC | PRN
Start: 2017-07-20 — End: 2017-07-20
  Administered 2017-07-20: 1000 mL

## 2017-07-20 MED ORDER — ROCURONIUM BROMIDE 10 MG/ML (PF) SYRINGE
PREFILLED_SYRINGE | INTRAVENOUS | Status: AC
  Filled 2017-07-20: qty 5

## 2017-07-20 SURGICAL SUPPLY — 109 items
APPLIER CLIP 5 13 M/L LIGAMAX5 (MISCELLANEOUS)
BLADE EXTENDED COATED 6.5IN (ELECTRODE) IMPLANT
CABLE HIGH FREQUENCY MONO STRZ (ELECTRODE) IMPLANT
CANNULA REDUC XI 12-8 STAPL (CANNULA) ×1
CANNULA REDUCER 12-8 DVNC XI (CANNULA) ×1 IMPLANT
CELLS DAT CNTRL 66122 CELL SVR (MISCELLANEOUS) IMPLANT
CHLORAPREP W/TINT 26ML (MISCELLANEOUS) ×2 IMPLANT
CLIP APPLIE 5 13 M/L LIGAMAX5 (MISCELLANEOUS) IMPLANT
CLIP VESOLOCK LG 6/CT PURPLE (CLIP) IMPLANT
CLIP VESOLOCK MED 6/CT (CLIP) IMPLANT
COVER SURGICAL LIGHT HANDLE (MISCELLANEOUS) ×4 IMPLANT
COVER TIP SHEARS 8 DVNC (MISCELLANEOUS) ×1 IMPLANT
COVER TIP SHEARS 8MM DA VINCI (MISCELLANEOUS) ×1
DERMABOND ADVANCED (GAUZE/BANDAGES/DRESSINGS) ×1
DERMABOND ADVANCED .7 DNX12 (GAUZE/BANDAGES/DRESSINGS) ×1 IMPLANT
DRAIN CHANNEL 19F RND (DRAIN) ×2 IMPLANT
DRAPE ARM DVNC X/XI (DISPOSABLE) ×4 IMPLANT
DRAPE COLUMN DVNC XI (DISPOSABLE) ×1 IMPLANT
DRAPE DA VINCI XI ARM (DISPOSABLE) ×4
DRAPE DA VINCI XI COLUMN (DISPOSABLE) ×1
DRAPE LAPAROSCOPIC ABDOMINAL (DRAPES) ×2 IMPLANT
DRAPE SURG IRRIG POUCH 19X23 (DRAPES) ×2 IMPLANT
DRSG OPSITE POSTOP 4X10 (GAUZE/BANDAGES/DRESSINGS) IMPLANT
DRSG OPSITE POSTOP 4X6 (GAUZE/BANDAGES/DRESSINGS) IMPLANT
DRSG OPSITE POSTOP 4X8 (GAUZE/BANDAGES/DRESSINGS) IMPLANT
ELECT PENCIL ROCKER SW 15FT (MISCELLANEOUS) ×4 IMPLANT
ELECT REM PT RETURN 15FT ADLT (MISCELLANEOUS) ×2 IMPLANT
ENDOLOOP SUT PDS II  0 18 (SUTURE)
ENDOLOOP SUT PDS II 0 18 (SUTURE) IMPLANT
EVACUATOR SILICONE 100CC (DRAIN) ×2 IMPLANT
GAUZE SPONGE 4X4 12PLY STRL (GAUZE/BANDAGES/DRESSINGS) IMPLANT
GLOVE BIO SURGEON STRL SZ 6.5 (GLOVE) ×6 IMPLANT
GLOVE BIOGEL PI IND STRL 7.0 (GLOVE) ×3 IMPLANT
GLOVE BIOGEL PI INDICATOR 7.0 (GLOVE) ×3
GOWN STRL REUS W/TWL 2XL LVL3 (GOWN DISPOSABLE) ×6 IMPLANT
GOWN STRL REUS W/TWL XL LVL3 (GOWN DISPOSABLE) ×8 IMPLANT
GRASPER ENDOPATH ANVIL 10MM (MISCELLANEOUS) IMPLANT
GRASPER SUT TROCAR 14GX15 (MISCELLANEOUS) IMPLANT
HOLDER FOLEY CATH W/STRAP (MISCELLANEOUS) ×2 IMPLANT
IRRIG SUCT STRYKERFLOW 2 WTIP (MISCELLANEOUS) ×2
IRRIGATION SUCT STRKRFLW 2 WTP (MISCELLANEOUS) ×1 IMPLANT
IRRIGATOR SUCT 8 DISP DVNC XI (IRRIGATION / IRRIGATOR) IMPLANT
IRRIGATOR SUCTION 8MM XI DISP (IRRIGATION / IRRIGATOR)
KIT PROCEDURE DA VINCI SI (MISCELLANEOUS) ×1
KIT PROCEDURE DVNC SI (MISCELLANEOUS) ×1 IMPLANT
LUBRICANT JELLY K Y 4OZ (MISCELLANEOUS) ×2 IMPLANT
NEEDLE INSUFFLATION 14GA 120MM (NEEDLE) ×2 IMPLANT
PACK CARDIOVASCULAR III (CUSTOM PROCEDURE TRAY) ×2 IMPLANT
PACK COLON (CUSTOM PROCEDURE TRAY) ×2 IMPLANT
PAD POSITIONING PINK XL (MISCELLANEOUS) ×2 IMPLANT
PORT LAP GEL ALEXIS MED 5-9CM (MISCELLANEOUS) ×2 IMPLANT
POSITIONER SURGICAL ARM (MISCELLANEOUS) ×2 IMPLANT
RTRCTR WOUND ALEXIS 18CM MED (MISCELLANEOUS)
SCISSORS LAP 5X35 DISP (ENDOMECHANICALS) ×2 IMPLANT
SEAL CANN UNIV 5-8 DVNC XI (MISCELLANEOUS) ×4 IMPLANT
SEAL XI 5MM-8MM UNIVERSAL (MISCELLANEOUS) ×4
SEALER TISSUE G2 STRG ARTC 35C (ENDOMECHANICALS) IMPLANT
SEALER TISSUE X1 CVD JAW (INSTRUMENTS) IMPLANT
SEALER VESSEL DA VINCI XI (MISCELLANEOUS) ×1
SEALER VESSEL EXT DVNC XI (MISCELLANEOUS) ×1 IMPLANT
SLEEVE ADV FIXATION 5X100MM (TROCAR) IMPLANT
SLEEVE XCEL OPT CAN 5 100 (ENDOMECHANICALS) ×2 IMPLANT
SOLUTION ELECTROLUBE (MISCELLANEOUS) ×2 IMPLANT
SPONGE DRAIN TRACH 4X4 STRL 2S (GAUZE/BANDAGES/DRESSINGS) IMPLANT
SPONGE LAP 18X18 X RAY DECT (DISPOSABLE) IMPLANT
STAPLER 45 BLU RELOAD XI (STAPLE) ×1 IMPLANT
STAPLER 45 BLUE RELOAD XI (STAPLE) ×1
STAPLER 45 GREEN RELOAD XI (STAPLE) ×2
STAPLER 45 GRN RELOAD XI (STAPLE) ×2 IMPLANT
STAPLER CANNULA SEAL DVNC XI (STAPLE) ×1 IMPLANT
STAPLER CANNULA SEAL XI (STAPLE) ×1
STAPLER CIRC ILS CVD 33MM 37CM (STAPLE) ×2 IMPLANT
STAPLER SHEATH (SHEATH) ×1
STAPLER SHEATH ENDOWRIST DVNC (SHEATH) ×1 IMPLANT
STAPLER VISISTAT 35W (STAPLE) IMPLANT
SUT ETHILON 2 0 PS N (SUTURE) ×2 IMPLANT
SUT NOVA NAB DX-16 0-1 5-0 T12 (SUTURE) ×8 IMPLANT
SUT NOVA NAB GS-21 0 18 T12 DT (SUTURE) IMPLANT
SUT NOVA NAB GS-21 1 T12 (SUTURE) IMPLANT
SUT PDS AB 1 CTX 36 (SUTURE) IMPLANT
SUT PDS AB 1 TP1 96 (SUTURE) IMPLANT
SUT PROLENE 2 0 KS (SUTURE) ×2 IMPLANT
SUT SILK 2 0 (SUTURE) ×1
SUT SILK 2 0 SH CR/8 (SUTURE) ×2 IMPLANT
SUT SILK 2-0 18XBRD TIE 12 (SUTURE) ×1 IMPLANT
SUT SILK 3 0 (SUTURE) ×1
SUT SILK 3 0 SH CR/8 (SUTURE) ×2 IMPLANT
SUT SILK 3-0 18XBRD TIE 12 (SUTURE) ×1 IMPLANT
SUT V-LOC BARB 180 2/0GR6 GS22 (SUTURE)
SUT VIC AB 2-0 SH 18 (SUTURE) ×4 IMPLANT
SUT VIC AB 2-0 SH 27 (SUTURE) ×1
SUT VIC AB 2-0 SH 27X BRD (SUTURE) ×1 IMPLANT
SUT VIC AB 3-0 SH 18 (SUTURE) ×2 IMPLANT
SUT VIC AB 4-0 PS2 18 (SUTURE) ×4 IMPLANT
SUT VIC AB 4-0 PS2 27 (SUTURE) ×4 IMPLANT
SUT VICRYL 0 UR6 27IN ABS (SUTURE) ×2 IMPLANT
SUTURE V-LC BRB 180 2/0GR6GS22 (SUTURE) IMPLANT
SYR 10ML ECCENTRIC (SYRINGE) ×2 IMPLANT
SYS LAPSCP GELPORT 120MM (MISCELLANEOUS)
SYSTEM LAPSCP GELPORT 120MM (MISCELLANEOUS) IMPLANT
TOWEL OR 17X26 10 PK STRL BLUE (TOWEL DISPOSABLE) ×2 IMPLANT
TOWEL OR NON WOVEN STRL DISP B (DISPOSABLE) ×2 IMPLANT
TRAY FOLEY W/METER SILVER 16FR (SET/KITS/TRAYS/PACK) ×2 IMPLANT
TROCAR ADV FIXATION 5X100MM (TROCAR) ×2 IMPLANT
TROCAR BLADELESS OPT 5 100 (ENDOMECHANICALS) ×2 IMPLANT
TROCAR XCEL BLUNT TIP 100MML (ENDOMECHANICALS) IMPLANT
TUBING CONNECTING 10 (TUBING) ×4 IMPLANT
TUBING INSUF HEATED (TUBING) ×2 IMPLANT
TUBING INSUFFLATION 10FT LAP (TUBING) ×2 IMPLANT

## 2017-07-20 NOTE — Anesthesia Postprocedure Evaluation (Signed)
Anesthesia Post Note  Patient: Myrtle Barnhard.  Procedure(s) Performed: XI ROBOTIC ASSISTED LOWER ANTERIOR RESECTION ERAS PATHWAY (N/A Abdomen) DIVERTING LOOP COLOSTOMY (N/A Abdomen)     Patient location during evaluation: PACU Anesthesia Type: General Level of consciousness: awake and alert Pain management: pain level controlled Vital Signs Assessment: post-procedure vital signs reviewed and stable Respiratory status: spontaneous breathing, nonlabored ventilation, respiratory function stable and patient connected to nasal cannula oxygen Cardiovascular status: blood pressure returned to baseline and stable Postop Assessment: no apparent nausea or vomiting Anesthetic complications: no    Last Vitals:  Vitals:   07/20/17 1346 07/20/17 1449  BP: 110/76 112/70  Pulse: 85 96  Resp: 20 18  Temp: 36.9 C 37 C  SpO2: 100% 100%    Last Pain:  Vitals:   07/20/17 1449  TempSrc: Oral  PainSc:                  Barnet [place]

## 2017-07-20 NOTE — Op Note (Addendum)
07/20/2017  12:17 PM  PATIENT:  Jacob Galvan.  42 y.o. male  Patient Care Team: Patient, No Pcp Per as PCP - General (General Practice)  PRE-OPERATIVE DIAGNOSIS:  Rectal cancer  POST-OPERATIVE DIAGNOSIS:  Rectal cancer  PROCEDURE: XI ROBOTIC ASSISTED LOWER ANTERIOR RESECTION with SPLENIC FLEXURE MOBILIZATION, DIVERTING LOOP OSTOMY    Surgeon(s): Leighton Ruff, MD Ileana Roup, MD  ASSISTANT: Dr Dema Severin   ANESTHESIA:   local and general  EBL: 283ml Total I/O In: 1500 [I.V.:1500] Out: 295 [Urine:95; Blood:200]  Delay start of Pharmacological VTE agent (>24hrs) due to surgical blood loss or risk of bleeding:  no  DRAINS: (44F) Jackson-Pratt drain(s) with closed bulb suction in the pelvis   SPECIMEN: Rectum and sigmoid colon, distal anastomotic doughnut as final distal margin  DISPOSITION OF SPECIMEN:  PATHOLOGY  COUNTS:  YES  PLAN OF CARE: Admit to inpatient   PATIENT DISPOSITION:  PACU - hemodynamically stable.  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  I recommended low anterior resection:  The anatomy & physiology of the digestive tract was discussed.  The pathophysiology was discussed.  Natural history risks without surgery was discussed.   I worked to give an overview of the disease and the frequent need to have multispecialty involvement.  I feel the risks of no intervention will lead to serious problems that outweigh the operative risks; therefore, I recommended a partial colectomy to remove the pathology.  Laparoscopic & open techniques were discussed.   Risks such as bleeding, infection, abscess, leak, reoperation, possible ostomy, hernia, heart attack, death, and other risks were discussed.  I noted a good likelihood this will help address the problem.   Goals of post-operative recovery were discussed as well.    The patient expressed understanding & wished to proceed with surgery.  OR FINDINGS:   Patient had  tumor and mucosal changes down to ~3cm from the anal verge  No visually obvious metastatic disease on visceral parietal peritoneum or liver.  The anastomosis rests 4 cm from the anal verge by rigid proctoscopy.  DESCRIPTION:   Informed consent was confirmed.  The patient underwent general anaesthesia without difficulty.  The patient was positioned appropriately.  VTE prevention in place.  The patient's abdomen was clipped, prepped, & draped in a sterile fashion.  Surgical timeout confirmed our plan.  The patient was positioned in reverse Trendelenburg.  Abdominal entry was gained using a Varies need in the LUQ.  Entry was clean.  I induced carbon dioxide insufflation.  An 5mm robotic port was placed in the RUQ.  Camera inspection revealed no injury.  Extra ports were carefully placed under direct laparoscopic visualization.  I laparoscopically reflected the greater omentum and the upper abdomen the small bowel in the upper abdomen.  I evaluated the entire abdomen.  There is no sign of metastatic disease.  I could not identify the lesion noted in the liver on MRI.  The patient was appropriately positioned and the robot was docked to the patient's left side.  Instruments were placed under direct visualization.     I mobilized the sigmoid colon off of the pelvic sidewall.  I scored the base of peritoneum of the right side of the mesentery of the left colon from the ligament of Treitz to the peritoneal reflection of the mid rectum.  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  I elevated the sigmoid mesentery  and enetered into the retro-mesenteric plane. We were able to identify the left ureter and gonadal vessels. We kept those posterior within the retroperitoneum and elevated the left colon mesentery off that. I did isolated IMA pedicle but did not ligate it yet.  I continued distally and got into the avascular plane posterior to the  mesorectum. This allowed me to help mobilize the rectum as well by freeing the mesorectum off the sacrum.  I mobilized the peritoneal coverings towards the peritoneal reflection on both the right and left sides of the rectum.  I could see the right and left ureters and stayed away from them.    I skeletonized the inferior mesenteric artery pedicle.  I went down to its takeoff from the aorta.   I isolated the inferior mesenteric vein off of the ligament of Treitz just cephalad to that as well.  After confirming the left ureter was out of the way, I went ahead and ligated the inferior mesenteric artery pedicle with bipolar robotic vessel sealer ~2cm above its takeoff from the aorta.   I did ligate the inferior mesenteric vein in a similar fashion.  The remaining posterior mesorectum was dissected off of the sacral prominence and dissection was carried down to the level of the pelvic floor posteriorly.  I then carried this dissection out laterally in both directions.  There was significant scar tissue in the left anterior distal mesorectum.  I dissected at anterior midline and left posterior to allow for better visualization of my mesorectal margin.  Once this was complete, I could connect the 2 lines more easily.  I tracked the ureter down on the left side as well to make sure that this was not caught up in the scar tissue.  Dissection was carried through layer by layer using hook cautery.  Once I was circumferentially around the rectum distally down to the level of the pelvic floor I introduced a robotic stapler into the pelvis and the distal rectum was transected below the level of the tumor using 2 robotic stapler loads.   I mobilized the left colon in a lateral to medial fashion off the line of [place] up towards the splenic flexure to ensure good mobilization of the left colon to reach into the pelvis.  This did not allow for complete mobilization down to the pelvis.  The robotic arms were re-oriented to the  left upper quadrant and the patient was placed in reverse Trendelenburg position.  The splenic flexure was taken down using the robotic vessel sealer.  Once this was completed, the colon easily reached into the pelvis.  I then enlarged my 12 mm port and made a Pfannenstiel incision suprapubically.  An [place] wound protector was placed.  The colon was brought out through this and transected at the previously dissected margin over a pursestring device.  A 2-0 Prolene pursestring was placed.  This was secured with 3-0 silk sutures.  A 33 mm EEA anvil was then placed and the pursestring was tied tightly around this.  This was then placed back into the pelvis.  The cap was placed on the [place] and an anastomosis was created using the EEA stapler under direct laparoscopic visualization.  There was no tension on the anastomosis.  There was no leak when tested with insufflation under water.  The anastomosis rest approximately 4 cm from the anal verge.  Given the patient's radiation history and the low anastomosis, I decided to perform a diverting loop ileostomy.  The terminal ileum was  identified and brought out through the right lower quadrant port site.  A red rubber catheter was used as an ostomy bar.  We confirmed proximal and distal limbs laparoscopically.  A 19 [place] Blake drain was placed in the pelvis and brought out through the assist port in the right lower quadrant.  This was secured with a 2-0 nylon suture.  The abdomen was then desufflated and all ports were removed.  We then switched to clean gowns, gloves, instruments and drapes.  The peritoneum was closed using a running 2-0 Vicryl suture.  The fascia was closed using interrupted #1 Novafil sutures.  Subcutaneous tissue was reapproximated using interrupted 2-0 Vicryl sutures and the skin was closed with a running 4-0 Vicryl subcuticular suture.  The remaining port sites were also closed with 4-0 Vicryl suture and Dermabond.  Once this was complete,  the loop ileostomy was matured in standard [place] fashion using interrupted 2-0 Vicryl sutures.  An ostomy appliance was placed.  A dressing was placed on the Pfannenstiel incision and on the drain site.  The patient was then awakened from anesthesia and sent to the postanesthesia care unit in stable condition.  All counts were correct per operating room staff.  An MD assistant was necessary for tissue manipulation, retraction and positioning due to the complexity of the case and hospital policies

## 2017-07-20 NOTE — Consult Note (Signed)
[place] Nurse ostomy follow up Stoma type/location: RUQ ileostomy with support rod (red robbin cath) Patient very groggy from surgery earlier today.  Provided a [place] booklet on Understanding you Ileostomy and explained that the teaching and changing the pouch will be done tomorrow. Thank you for the consult.  Val Riles, RN, MSN, CWOCN, CNS-BC, pager [phone]

## 2017-07-20 NOTE — Anesthesia Procedure Notes (Signed)
Procedure Name: Intubation Date/Time: 07/20/2017 7:40 AM Performed by: West Pugh, CRNA Pre-anesthesia Checklist: Patient identified, Emergency Drugs available, Suction available, Patient being monitored and Timeout performed Patient Re-evaluated:Patient Re-evaluated prior to induction Oxygen Delivery Method: Circle system utilized Preoxygenation: Pre-oxygenation with 100% oxygen Induction Type: IV induction Ventilation: Mask ventilation without difficulty Laryngoscope Size: Mac and 4 Grade View: Grade II Tube type: Oral Tube size: 7.5 mm Number of attempts: 1 Airway Equipment and Method: Stylet Placement Confirmation: ETT inserted through vocal cords under direct vision,  positive ETCO2,  CO2 detector and breath sounds checked- equal and bilateral Secured at: 22 cm Tube secured with: Tape Dental Injury: Teeth and Oropharynx as per pre-operative assessment

## 2017-07-20 NOTE — Transfer of Care (Signed)
Immediate Anesthesia Transfer of Care Note  Patient: Jacob Galvan.  Procedure(s) Performed: XI ROBOTIC ASSISTED LOWER ANTERIOR RESECTION ERAS PATHWAY (N/A Abdomen) DIVERTING LOOP COLOSTOMY (N/A Abdomen)  Patient Location: PACU  Anesthesia Type:General  Level of Consciousness: drowsy and patient cooperative  Airway & Oxygen Therapy: Patient Spontanous Breathing and Patient connected to face mask oxygen  Post-op Assessment: Report given to RN and Post -op Vital signs reviewed and stable  Post vital signs: Reviewed and stable  Last Vitals:  Vitals Value Taken Time  BP 118/71 07/20/2017 12:35 PM  Temp    Pulse 81 07/20/2017 12:37 PM  Resp 18 07/20/2017 12:37 PM  SpO2 100 % 07/20/2017 12:37 PM  Vitals shown include unvalidated device data.  Last Pain:  Vitals:   07/20/17 0612  TempSrc: Oral  PainSc:       Patients Stated Pain Goal: 4 (81/85/90 9311)  Complications: No apparent anesthesia complications

## 2017-07-20 NOTE — Interval H&P Note (Signed)
pt ready for or.  all questions answered.   likelihood of temporary stoma is high.  there is also an increased risk of nerve damage due to tumor location.   this can result in bowel, bladder and sexual dysfunction.   Leighton Ruff,  MD

## 2017-07-20 NOTE — Progress Notes (Signed)
Patient is admitted to 1539 from PACU. Admission VS is stable. Patient is drowsy and no family at the bedside at this time

## 2017-07-21 ENCOUNTER — Encounter (HOSPITAL_COMMUNITY): Payer: Self-pay | Admitting: General Surgery

## 2017-07-21 LAB — CBC
HEMATOCRIT: 41.9 % (ref 39.0–52.0)
HEMOGLOBIN: 13.6 g/dL (ref 13.0–17.0)
MCH: 30.4 pg (ref 26.0–34.0)
MCHC: 32.5 g/dL (ref 30.0–36.0)
MCV: 93.7 fL (ref 78.0–100.0)
PLATELETS: 270 10*3/uL (ref 150–400)
RBC: 4.47 MIL/uL (ref 4.22–5.81)
RDW: 13.6 % (ref 11.5–15.5)
WBC: 11.1 10*3/uL — AB (ref 4.0–10.5)

## 2017-07-21 LAB — BASIC METABOLIC PANEL
ANION GAP: 7 (ref 5–15)
BUN: 6 mg/dL (ref 6–20)
CHLORIDE: 103 mmol/L (ref 101–111)
CO2: 27 mmol/L (ref 22–32)
Calcium: 8.3 mg/dL — ABNORMAL LOW (ref 8.9–10.3)
Creatinine, Ser: 0.92 mg/dL (ref 0.61–1.24)
GFR calc Af Amer: >60 mL/min (ref >60)
GFR calc non Af Amer: >60 mL/min (ref >60)
Glucose, Bld: 144 mg/dL — ABNORMAL HIGH (ref 65–99)
POTASSIUM: 3.8 mmol/L (ref 3.5–5.1)
SODIUM: 137 mmol/L (ref 135–145)

## 2017-07-21 MED ORDER — HYDROMORPHONE HCL 1 MG/ML IJ SOLN
0.5000 mg | INTRAMUSCULAR | Status: DC | PRN
  Administered 2017-07-21 – 2017-07-31 (×41): 1 mg via INTRAVENOUS
  Filled 2017-07-21 (×41): qty 1

## 2017-07-21 MED ORDER — KETOROLAC TROMETHAMINE 15 MG/ML IJ SOLN
15.0000 mg | Freq: Four times a day (QID) | INTRAMUSCULAR | Status: AC
  Administered 2017-07-21 – 2017-07-26 (×20): 15 mg via INTRAVENOUS
  Filled 2017-07-21 (×21): qty 1

## 2017-07-21 NOTE — Progress Notes (Signed)
1 Day Post-Op Robotic LAR with diverting loop ileostomy Subjective: Having quite a bit of pain.  No nausea.  Tolerating clears  Objective: Vital signs in last 24 hours: Temp:  [97.7 F (36.5 C)-99.3 F (37.4 C)] 98.8 F (37.1 C) (04/05 0347) Pulse Rate:  [78-99] 98 (04/05 0347) Resp:  [15-20] 17 (04/05 0347) BP: (101-118)/(60-86) 101/60 (04/05 0347) SpO2:  [98 %-100 %] 98 % (04/05 0347) Weight:  [74 kg (163 lb 3.2 oz)] 74 kg (163 lb 3.2 oz) (04/05 0500)   Intake/Output from previous day: 04/04 0701 - 04/05 0700 In: 3252.5 [P.O.:240; I.V.:2962.5; IV Piggyback:50] Out: 9371 [IRCVE:9381; Drains:465; Stool:70; Blood:200] Intake/Output this shift: No intake/output data recorded.   General appearance: alert and cooperative GI: normal findings: soft, non-distended, appropriately tender  Incision: no significant drainage  Lab Results:  Recent Labs    07/18/17 0949 07/21/17 0434  WBC 8.2 11.1*  HGB 14.2 13.6  HCT 42.6 41.9  PLT 307 270   BMET Recent Labs    07/18/17 0949 07/21/17 0434  NA 138 137  K 3.6 3.8  CL 104 103  CO2 26 27  GLUCOSE 110* 144*  BUN 8 6  CREATININE 0.81 0.92  CALCIUM 8.8* 8.3*   PT/INR No results for input(s): LABPROT, INR in the last 72 hours. ABG No results for input(s): PHART, HCO3 in the last 72 hours.  Invalid input(s): PCO2, PO2  MEDS, Scheduled . acetaminophen  1,000 mg Oral Q6H  . alvimopan  12 mg Oral BID  . enoxaparin (LOVENOX) injection  40 mg Subcutaneous Q24H    Studies/Results: No results found.  Assessment: s/p Procedure(s): XI ROBOTIC ASSISTED LOWER ANTERIOR RESECTION ERAS PATHWAY DIVERTING LOOP COLOSTOMY Patient Active Problem List   Diagnosis Date Noted  . Genetic testing 04/17/2017  . Port-A-Cath in place 04/17/2017  . Family history of colon cancer 03/29/2017  . Family history of melanoma 03/29/2017  . Family history of pancreatic cancer 03/29/2017  . Family history of testicular cancer 03/29/2017  .  Rectal cancer ([place]) 03/17/2017    Expected post op course  Plan: Advance diet to full liquids Decrease MIV Ambulate Increase pain meds for better pain control Will try some scheduled toradol as well   LOS: 1 day     .Rosario Adie, MD [facility], [place]   07/21/2017 8:07 AM

## 2017-07-21 NOTE — Progress Notes (Signed)
  Oncology Nurse Navigator Documentation  Navigator Location: [facility] (07/21/17 1148)   )                      Patient Visit Type: Inpatient (07/21/17 1148) Met with patient on 5W to offer encouragement and support.  Patient processed being eager to finish with his cancer care. Patient voiced that he will need more chemo and then an additional operation. Emotional support offered.       Interventions: Psycho-social support (07/21/17 1148)            Acuity: Level 2 (07/21/17 1148)     Acuity Level 3: Ongoing guidance and education provided throughout treatment (07/21/17 1148)   Time Spent with Patient: 30 (07/21/17 1148)

## 2017-07-21 NOTE — Consult Note (Signed)
[facility] Nurse ostomy follow up Stoma type/location: RUQ ileostomy Stomal assessment/size: 1.5 inches Peristomal assessment: Intact skin Treatment options for stomal/peristomal skin: Barrier ring Output:  serosanginous in pouch placed in OR yesterday.  Ostomy pouching: 1pc. Education provided: Enrolled patient in [employer] Discharge program: Yes Explained role of ostomy nurse and creation of stoma. Explained stoma characteristics (budded, flush, color, texture, care) Demonstrated pouch change (cutting new wafer, measuring stoma, cleaning peristomal skin and stoma, and explained use of barrier ring-not needed at this time) Education on emptying when 1/3 to 1/2 full and how to empty Discussed bathing, diet, gas, medication use, constipation, diarrhea, dehydration  Discussed food blockage   Discussed risk of peristomal hernia Answered patient questions to his expressed satisfaction. Val Riles, RN, MSN, CWOCN, CNS-BC, pager [phone]

## 2017-07-22 LAB — BASIC METABOLIC PANEL
Anion gap: 7 (ref 5–15)
BUN: 7 mg/dL (ref 6–20)
CALCIUM: 8.8 mg/dL — AB (ref 8.9–10.3)
CO2: 29 mmol/L (ref 22–32)
CREATININE: 0.95 mg/dL (ref 0.61–1.24)
Chloride: 100 mmol/L — ABNORMAL LOW (ref 101–111)
GFR calc non Af Amer: >60 mL/min (ref >60)
GLUCOSE: 128 mg/dL — AB (ref 65–99)
Potassium: 3.7 mmol/L (ref 3.5–5.1)
Sodium: 136 mmol/L (ref 135–145)

## 2017-07-22 LAB — CBC
HCT: 40.7 % (ref 39.0–52.0)
Hemoglobin: 13.7 g/dL (ref 13.0–17.0)
MCH: 31.4 pg (ref 26.0–34.0)
MCHC: 33.7 g/dL (ref 30.0–36.0)
MCV: 93.1 fL (ref 78.0–100.0)
PLATELETS: 240 10*3/uL (ref 150–400)
RBC: 4.37 MIL/uL (ref 4.22–5.81)
RDW: 13.5 % (ref 11.5–15.5)
WBC: 13.5 10*3/uL — ABNORMAL HIGH (ref 4.0–10.5)

## 2017-07-22 MED ORDER — CHLORPROMAZINE HCL 25 MG PO TABS
25.0000 mg | ORAL_TABLET | Freq: Four times a day (QID) | ORAL | Status: DC | PRN
  Administered 2017-07-22 – 2017-08-21 (×8): 25 mg via ORAL
  Filled 2017-07-22 (×11): qty 1

## 2017-07-22 NOTE — Progress Notes (Signed)
2 Days Post-Op Robotic LAR with diverting loop ileostomy Subjective: Pain somewhat better.  No nausea.  Tolerating some clears, no ostomy function yet  Objective: Vital signs in last 24 hours: Temp:  [98.6 F (37 C)-99 F (37.2 C)] 98.8 F (37.1 C) (04/06 0556) Pulse Rate:  [69-88] 88 (04/06 0556) Resp:  [16-18] 16 (04/06 0556) BP: (93-113)/(53-66) 101/66 (04/06 0556) SpO2:  [99 %-100 %] 100 % (04/06 0556) Weight:  [76.1 kg (167 lb 12.3 oz)] 76.1 kg (167 lb 12.3 oz) (04/06 0556)   Intake/Output from previous day: 04/05 0701 - 04/06 0700 In: 818.3 [P.O.:160; I.V.:658.3] Out: 1517 [Urine:1120; Drains:362; Stool:35] Intake/Output this shift: Total I/O In: -  Out: 135 [Urine:80; Drains:55]   General appearance: alert and cooperative GI: normal findings: soft, mildly distended, appropriately tender  Incision: no significant drainage  Lab Results:  Recent Labs    07/21/17 0434 07/22/17 0449  WBC 11.1* 13.5*  HGB 13.6 13.7  HCT 41.9 40.7  PLT 270 240   BMET Recent Labs    07/21/17 0434 07/22/17 0449  NA 137 136  K 3.8 3.7  CL 103 100*  CO2 27 29  GLUCOSE 144* 128*  BUN 6 7  CREATININE 0.92 0.95  CALCIUM 8.3* 8.8*   PT/INR No results for input(s): LABPROT, INR in the last 72 hours. ABG No results for input(s): PHART, HCO3 in the last 72 hours.  Invalid input(s): PCO2, PO2  MEDS, Scheduled . acetaminophen  1,000 mg Oral Q6H  . alvimopan  12 mg Oral BID  . enoxaparin (LOVENOX) injection  40 mg Subcutaneous Q24H  . ketorolac  15 mg Intravenous Q6H    Studies/Results: No results found.  Assessment: s/p Procedure(s): XI ROBOTIC ASSISTED LOWER ANTERIOR RESECTION ERAS PATHWAY DIVERTING LOOP COLOSTOMY Patient Active Problem List   Diagnosis Date Noted  . Genetic testing 04/17/2017  . Port-A-Cath in place 04/17/2017  . Family history of colon cancer 03/29/2017  . Family history of melanoma 03/29/2017  . Family history of pancreatic cancer 03/29/2017   . Family history of testicular cancer 03/29/2017  . Rectal cancer ([place]) 03/17/2017    Expected post op course  Plan: Cont liquids as tolerated Cont MIV Ambulate Cont IV dilaudid for better pain control Will try some scheduled toradol as well   LOS: 2 days     .Rosario Adie, MD [facility], [place]   07/22/2017 10:21 AM

## 2017-07-23 LAB — BASIC METABOLIC PANEL
Anion gap: 9 (ref 5–15)
BUN: 12 mg/dL (ref 6–20)
CHLORIDE: 100 mmol/L — AB (ref 101–111)
CO2: 28 mmol/L (ref 22–32)
CREATININE: 1 mg/dL (ref 0.61–1.24)
Calcium: 9.1 mg/dL (ref 8.9–10.3)
Glucose, Bld: 125 mg/dL — ABNORMAL HIGH (ref 65–99)
Potassium: 3.7 mmol/L (ref 3.5–5.1)
SODIUM: 137 mmol/L (ref 135–145)

## 2017-07-23 LAB — CBC
HCT: 42.9 % (ref 39.0–52.0)
Hemoglobin: 14.8 g/dL (ref 13.0–17.0)
MCH: 31.8 pg (ref 26.0–34.0)
MCHC: 34.5 g/dL (ref 30.0–36.0)
MCV: 92.1 fL (ref 78.0–100.0)
PLATELETS: 253 10*3/uL (ref 150–400)
RBC: 4.66 MIL/uL (ref 4.22–5.81)
RDW: 13.3 % (ref 11.5–15.5)
WBC: 10 10*3/uL (ref 4.0–10.5)

## 2017-07-23 MED ORDER — OXYCODONE HCL 5 MG PO TABS
5.0000 mg | ORAL_TABLET | ORAL | Status: DC | PRN
  Administered 2017-07-23 (×2): 10 mg via ORAL
  Administered 2017-07-24: 5 mg via ORAL
  Administered 2017-07-24 – 2017-08-28 (×40): 10 mg via ORAL
  Filled 2017-07-23 (×44): qty 2

## 2017-07-23 NOTE — Progress Notes (Signed)
3 Days Post-Op Robotic LAR with diverting loop ileostomy Subjective: Pain much better.  Had quite a bit of ostomy output  Objective: Vital signs in last 24 hours: Temp:  [98.9 F (37.2 C)-99.3 F (37.4 C)] 99.3 F (37.4 C) (04/07 0455) Pulse Rate:  [75-92] 92 (04/07 0455) Resp:  [16-20] 16 (04/07 0455) BP: (103-112)/(69-76) 112/76 (04/07 0455) SpO2:  [100 %] 100 % (04/07 0455) Weight:  [69.4 kg (153 lb)] 69.4 kg (153 lb) (04/07 0702)   Intake/Output from previous day: 04/06 0701 - 04/07 0700 In: 1475.8 [P.O.:360; I.V.:1115.8] Out: 3380 [Urine:205; Emesis/NG output:300; Drains:275; Stool:2600] Intake/Output this shift: No intake/output data recorded.   General appearance: alert and cooperative GI: normal findings: soft, mildly distended, appropriately tender  Incision: no significant drainage  Lab Results:  Recent Labs    07/22/17 0449 07/23/17 0432  WBC 13.5* 10.0  HGB 13.7 14.8  HCT 40.7 42.9  PLT 240 253   BMET Recent Labs    07/22/17 0449 07/23/17 0432  NA 136 137  K 3.7 3.7  CL 100* 100*  CO2 29 28  GLUCOSE 128* 125*  BUN 7 12  CREATININE 0.95 1.00  CALCIUM 8.8* 9.1   PT/INR No results for input(s): LABPROT, INR in the last 72 hours. ABG No results for input(s): PHART, HCO3 in the last 72 hours.  Invalid input(s): PCO2, PO2  MEDS, Scheduled . acetaminophen  1,000 mg Oral Q6H  . enoxaparin (LOVENOX) injection  40 mg Subcutaneous Q24H  . ketorolac  15 mg Intravenous Q6H    Studies/Results: No results found.  Assessment: s/p Procedure(s): XI ROBOTIC ASSISTED LOWER ANTERIOR RESECTION ERAS PATHWAY DIVERTING LOOP COLOSTOMY Patient Active Problem List   Diagnosis Date Noted  . Genetic testing 04/17/2017  . Port-A-Cath in place 04/17/2017  . Family history of colon cancer 03/29/2017  . Family history of melanoma 03/29/2017  . Family history of pancreatic cancer 03/29/2017  . Family history of testicular cancer 03/29/2017  . Rectal cancer  ([place]) 03/17/2017    Expected post op course  Plan: Soft diet  Cont MIV Ambulate IV dilaudid for better pain control, will add Oxycodone today    LOS: 3 days     .Rosario Adie, [facility], [place] [phone]   07/23/2017 9:23 AM

## 2017-07-24 MED ORDER — LOPERAMIDE HCL 2 MG PO CAPS
2.0000 mg | ORAL_CAPSULE | Freq: Three times a day (TID) | ORAL | Status: DC
Start: 2017-07-24 — End: 2017-07-27
  Administered 2017-07-24 – 2017-07-27 (×13): 2 mg via ORAL
  Filled 2017-07-24 (×13): qty 1

## 2017-07-24 MED ORDER — LACTATED RINGERS IV BOLUS
1000.0000 mL | Freq: Once | INTRAVENOUS | Status: AC
  Administered 2017-07-24: 1000 mL via INTRAVENOUS

## 2017-07-24 MED ORDER — LACTATED RINGERS IV SOLN
INTRAVENOUS | Status: DC
  Administered 2017-07-24 – 2017-07-26 (×4): via INTRAVENOUS

## 2017-07-24 NOTE — Progress Notes (Signed)
4 Days Post-Op Robotic LAR with diverting loop ileostomy Subjective: Pain much better with PO regimen added.  Had quite a bit of ostomy output and low UOP.  Not eating much  Objective: Vital signs in last 24 hours: Temp:  [98.6 F (37 C)-98.8 F (37.1 C)] 98.6 F (37 C) (04/08 0430) Pulse Rate:  [88-95] 88 (04/08 0430) Resp:  [16] 16 (04/08 0430) BP: (95-104)/(61-81) 95/81 (04/08 0430) SpO2:  [96 %-100 %] 98 % (04/08 0430) Weight:  [69.5 kg (153 lb 3.5 oz)] 69.5 kg (153 lb 3.5 oz) (04/08 0430)   Intake/Output from previous day: 04/07 0701 - 04/08 0700 In: 6437.1 [P.O.:360; I.V.:6077.1] Out: 2260 [Urine:300; Drains:185; Stool:1775] Intake/Output this shift: Total I/O In: -  Out: 300 [Stool:300]   General appearance: alert and cooperative GI: normal findings: soft, mildly distended, appropriately tender  Incision: no significant drainage  Lab Results:  Recent Labs    07/22/17 0449 07/23/17 0432  WBC 13.5* 10.0  HGB 13.7 14.8  HCT 40.7 42.9  PLT 240 253   BMET Recent Labs    07/22/17 0449 07/23/17 0432  NA 136 137  K 3.7 3.7  CL 100* 100*  CO2 29 28  GLUCOSE 128* 125*  BUN 7 12  CREATININE 0.95 1.00  CALCIUM 8.8* 9.1   PT/INR No results for input(s): LABPROT, INR in the last 72 hours. ABG No results for input(s): PHART, HCO3 in the last 72 hours.  Invalid input(s): PCO2, PO2  MEDS, Scheduled . acetaminophen  1,000 mg Oral Q6H  . enoxaparin (LOVENOX) injection  40 mg Subcutaneous Q24H  . ketorolac  15 mg Intravenous Q6H  . loperamide  2 mg Oral TID AC & HS    Studies/Results: No results found.  Assessment: s/p Procedure(s): XI ROBOTIC ASSISTED LOWER ANTERIOR RESECTION ERAS PATHWAY DIVERTING LOOP COLOSTOMY Patient Active Problem List   Diagnosis Date Noted  . Genetic testing 04/17/2017  . Port-A-Cath in place 04/17/2017  . Family history of colon cancer 03/29/2017  . Family history of melanoma 03/29/2017  . Family history of pancreatic  cancer 03/29/2017  . Family history of testicular cancer 03/29/2017  . Rectal cancer ([place]) 03/17/2017    Expected post op course  Plan: Soft diet as tolerated Cont MIV.  Will add LR bolus to help with low UOP Ambulate Pain control: IV dilaudid, Oxycodone     LOS: 4 days     .Rosario Adie, [facility], [place] [phone]   07/24/2017 7:27 AM

## 2017-07-24 NOTE — Consult Note (Signed)
[facility] Nurse ostomy follow up Stoma type/location: RUQ ileostomy, well budded with pink retention red rubber rod in place Stomal assessment/size:1 1/2" round pink and moist Peristomal assessment: intact Treatment options for stomal/peristomal skin: using barrier ring.  Has been leaking some.  Will change to 1 piece convex pouch for added security.  Patient understands rationale and is in agreement.  Liquid green stool.  He has been emptying indepandently Output liquid green effluent Ostomy pouching: 1pc.convex with barrier ring Education provided: Patient assisted with cutting barrier to fit, applying appliance and is independent with emptying.  Ordered two additional pouch sets at bedside.  Enrolled patient in [place] Start Discharge program: Yes [place] team will follow.  Domenic Moras RN BSN [place] Pager [phone]

## 2017-07-25 DIAGNOSIS — Z800 Family history of malignant neoplasm of digestive organs: Secondary | ICD-10-CM

## 2017-07-25 DIAGNOSIS — Z8659 Personal history of other mental and behavioral disorders: Secondary | ICD-10-CM

## 2017-07-25 DIAGNOSIS — K5903 Drug induced constipation: Secondary | ICD-10-CM

## 2017-07-25 DIAGNOSIS — Z980 Intestinal bypass and anastomosis status: Secondary | ICD-10-CM

## 2017-07-25 DIAGNOSIS — C20 Malignant neoplasm of rectum: Secondary | ICD-10-CM

## 2017-07-25 DIAGNOSIS — Z808 Family history of malignant neoplasm of other organs or systems: Secondary | ICD-10-CM

## 2017-07-25 DIAGNOSIS — G893 Neoplasm related pain (acute) (chronic): Secondary | ICD-10-CM

## 2017-07-25 DIAGNOSIS — G8918 Other acute postprocedural pain: Secondary | ICD-10-CM

## 2017-07-25 NOTE — Progress Notes (Signed)
IP PROGRESS NOTE  Subjective:   Jacob Galvan underwent a robotic assisted low anterior resection and diverting ileostomy by Dr. Marcello Moores on 07/20/2017.  He is recovering from surgery.  He denies rectal pain.  He continues to have postsurgical pain.  The ileostomy is functioning. At the time of surgery tumor was noted to 3 cm from the anal verge.  No evidence of metastatic disease.  The liver lesion was not identified.  The pathology revealed microscopic foci of poorly differentiated adenocarcinoma extending into pericolonic soft tissue.  An ulcer with a 3 x 2.5 x 2 cm indurated nodular mass was noted in the left lateral rectum.  No evidence of carcinoma and 14 lymph nodes.  Acellular mucin was noted in lymph nodes and at the inked radial margin.  No lymphovascular or perineural invasion.  Resection margins are negative.  Objective: Vital signs in last 24 hours: Blood pressure 104/72, pulse (!) 102, temperature 99.3 F (37.4 C), temperature source Oral, resp. rate 18, height 6\' 2"  (1.88 m), weight 153 lb 3.5 oz (69.5 kg), SpO2 100 %.  Intake/Output from previous day: 04/08 0701 - 04/09 0700 In: 2740 [P.O.:240; I.V.:2500] Out: 2815 [Urine:1050; Drains:40; Stool:1725]  Physical Exam: Not performed today    Portacath/PICC-without erythema  Lab Results: Recent Labs    07/23/17 0432  WBC 10.0  HGB 14.8  HCT 42.9  PLT 253    BMET Recent Labs    07/23/17 0432  NA 137  K 3.7  CL 100*  CO2 28  GLUCOSE 125*  BUN 12  CREATININE 1.00  CALCIUM 9.1    Lab Results  Component Value Date   CEA1 15.34 (H) 03/24/2017     Medications: I have reviewed the patient's current medications.  Assessment/Plan: 1. 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 Abdomen MRI 10/06/2016 ([id number])tumor,  negative chest CT ? 1 cycle of CapoxJuly 2018 ? Transferred care to [facility] August 2018 ? Status post Port-A-Cath placement 12/14/2016 ? CT abdomen/pelvis 02/09/2017-mass at the left rectum with adjacent fat stranding and subcentimeter nodules, ill-defined hypodense lesion in the superior left liver-indeterminate ? 3 cycles of FOLFOX, last cycle 02/15/2017 ? Radiation 04/03/2017; completed 05/19/2017 ? Infusional 5-FU 04/03/2017; completed 05/15/2017 ? PET scan 05/08/2017-rectal mass fairly mildly hypermetabolic with maximum SUV 5.7, with adjacent perirectal tumor nodularity mildly hypermetabolic with maximum SUV 3.1.  Dominant lesion in the dome of the left hepatic lobe is hypermetabolic with maximum SUV of 4.7.  Smaller 4 mm hypodense lesion centrally in the liver is not appreciably hypermetabolic. ? 05/30/2017 biopsy of liver lesion-negative for malignancy ? Low anterior resection/diverting ileostomy 07/20/2017, ypT3,ypN0, 14- lymph nodes, acellular mucin and lymph nodes and at the radial margin.  Significant treatment effect, microscopic foci of adenocarcinoma 2.Pain secondary to #1.Improved 04/17/2017. 3.Constipation secondary to #1 and narcotics 4.Psychiatric admission August 2018-depression/suicidal ideation, felt to be related to capecitabine per patient 5.Family history of multiple cancers including pancreas and colon cancer. He has been seen by the genetics counselor.   Jacob Galvan is recovering from surgery.  I reviewed the pathology findings with him.  He has been diagnosed with pathologic stage II rectal cancer, but he likely has stage IV disease based on the hypermetabolic liver lesion.  Regardless of the liver lesion he is at significant risk of systemic progression based on the pretreatment staging.  I  will present his case at the GI tumor conference on 08/02/2017.  We will plan for reimaging of the liver within the next few months.  He may be a candidate for a  liver resection procedure at the time of the ileostomy takedown.  Jacob Galvan will return for office visit and further discussion on 08/09/2017.  We will check a CEA when he returns on 08/09/2017.     LOS: 5 days   Betsy Coder, MD   07/25/2017, 8:12 AM

## 2017-07-25 NOTE — Progress Notes (Signed)
5 Days Post-Op Robotic LAR with diverting loop ileostomy Subjective: Pain better.  Ostomy output slowing, UOP picking up.  Still not eating much  Objective: Vital signs in last 24 hours: Temp:  [98.2 F (36.8 C)-100.2 F (37.9 C)] 99.3 F (37.4 C) (04/09 0545) Pulse Rate:  [91-112] 102 (04/09 0545) Resp:  [16-18] 18 (04/09 0545) BP: (101-104)/(62-72) 104/72 (04/09 0545) SpO2:  [99 %-100 %] 100 % (04/09 0545)   Intake/Output from previous day: 04/08 0701 - 04/09 0700 In: 2740 [P.O.:240; I.V.:2500] Out: 2815 [Urine:1050; Drains:40; YTKZS:0109] Intake/Output this shift: No intake/output data recorded.   General appearance: alert and cooperative GI: normal findings: soft, non distended, appropriately tender  Incision: no significant drainage  Lab Results:  Recent Labs    07/23/17 0432  WBC 10.0  HGB 14.8  HCT 42.9  PLT 253   BMET Recent Labs    07/23/17 0432  NA 137  K 3.7  CL 100*  CO2 28  GLUCOSE 125*  BUN 12  CREATININE 1.00  CALCIUM 9.1   PT/INR No results for input(s): LABPROT, INR in the last 72 hours. ABG No results for input(s): PHART, HCO3 in the last 72 hours.  Invalid input(s): PCO2, PO2  MEDS, Scheduled . acetaminophen  1,000 mg Oral Q6H  . enoxaparin (LOVENOX) injection  40 mg Subcutaneous Q24H  . ketorolac  15 mg Intravenous Q6H  . loperamide  2 mg Oral TID AC & HS    Studies/Results: No results found.  Assessment: s/p Procedure(s): XI ROBOTIC ASSISTED LOWER ANTERIOR RESECTION ERAS PATHWAY DIVERTING LOOP COLOSTOMY Patient Active Problem List   Diagnosis Date Noted  . Genetic testing 04/17/2017  . Port-A-Cath in place 04/17/2017  . Family history of colon cancer 03/29/2017  . Family history of melanoma 03/29/2017  . Family history of pancreatic cancer 03/29/2017  . Family history of testicular cancer 03/29/2017  . Rectal cancer ([place]) 03/17/2017    Expected post op course  Plan: Soft diet as tolerated Cont MIV.    Encourage PO intake Ambulate Pain control: wean dilaudid, Oxycodone     LOS: 5 days     .Rosario Adie, [facility], [place]   07/25/2017 8:22 AM

## 2017-07-26 MED ORDER — IBUPROFEN 200 MG PO TABS
400.0000 mg | ORAL_TABLET | ORAL | Status: DC | PRN
  Filled 2017-07-26: qty 2

## 2017-07-26 MED ORDER — BOOST / RESOURCE BREEZE PO LIQD CUSTOM
1.0000 | Freq: Three times a day (TID) | ORAL | Status: DC
  Administered 2017-07-26 (×2): 1 via ORAL

## 2017-07-26 NOTE — Progress Notes (Signed)
6 Days Post-Op Robotic LAR with diverting loop ileostomy Subjective: Pain better.  Ostomy output slowing, UOP excellent with IVF's.  Still not eating much but drinking ok  Objective: Vital signs in last 24 hours: Temp:  [98.3 F (36.8 C)-99.6 F (37.6 C)] 99.3 F (37.4 C) (04/10 0600) Pulse Rate:  [89-96] 96 (04/10 0600) Resp:  [18-20] 20 (04/10 0600) BP: (102-114)/(57-72) 113/69 (04/10 0600) SpO2:  [100 %] 100 % (04/10 0600)   Intake/Output from previous day: 04/09 0701 - 04/10 0700 In: 4060 [P.O.:1560; I.V.:2500] Out: 3725 [Urine:2500; Stool:1225] Intake/Output this shift: Total I/O In: -  Out: 300 [Stool:300]   General appearance: alert and cooperative GI: normal findings: soft, non distended, appropriately tender  Incision: no significant drainage  Lab Results:  No results for input(s): WBC, HGB, HCT, PLT in the last 72 hours. BMET No results for input(s): NA, K, CL, CO2, GLUCOSE, BUN, CREATININE, CALCIUM in the last 72 hours. PT/INR No results for input(s): LABPROT, INR in the last 72 hours. ABG No results for input(s): PHART, HCO3 in the last 72 hours.  Invalid input(s): PCO2, PO2  MEDS, Scheduled . acetaminophen  1,000 mg Oral Q6H  . enoxaparin (LOVENOX) injection  40 mg Subcutaneous Q24H  . loperamide  2 mg Oral TID AC & HS    Studies/Results: No results found.  Assessment: s/p Procedure(s): XI ROBOTIC ASSISTED LOWER ANTERIOR RESECTION ERAS PATHWAY DIVERTING LOOP COLOSTOMY Patient Active Problem List   Diagnosis Date Noted  . Genetic testing 04/17/2017  . Port-A-Cath in place 04/17/2017  . Family history of colon cancer 03/29/2017  . Family history of melanoma 03/29/2017  . Family history of pancreatic cancer 03/29/2017  . Family history of testicular cancer 03/29/2017  . Rectal cancer ([place]) 03/17/2017    Expected post op course  Plan: Reg diet as tolerated SL MIV.   Encourage PO intake, will try boost today Ambulate Pain control: wean  dilaudid, Oxycodone, tylenol, Ibuprofen D/c ostomy bar     LOS: 6 days     .Rosario Adie, [facility], [place]   07/26/2017 8:37 AM

## 2017-07-26 NOTE — Consult Note (Signed)
[facility] Nurse ostomy follow up Stoma type/location: RUQ ileostomy  Well budded. Removed red rubber catheter per MD order.  High output ileostomy with large liquid effluent.  Discussed dehydration risks and symptoms such as drymouth, decreased urine output dry skin and weakness. Encouraged to sips liquids such as gatorade/sports drinks, juice, noncaffeinated beverages and protein drinks.  Stomal assessment/size: pink moist  Removed retention rod Peristomal assessment: intact.  JP site at 9 o'clcok and this is dressed with dry dressing.   Treatment options for stomal/peristomal skin: barrier ring and convex pouch Output liquid green effluent  High output at this time.  Ostomy pouching: 1pc.convex with barrier ring Education provided: dehydration, pouch changes and ongoing education with HH.  Enrolled patient in [place] Start Discharge program: Yes [place] team will follow.  Domenic Moras RN BSN [place] Pager [phone]

## 2017-07-27 ENCOUNTER — Other Ambulatory Visit (HOSPITAL_COMMUNITY): Payer: Self-pay

## 2017-07-27 ENCOUNTER — Ambulatory Visit (HOSPITAL_COMMUNITY): Payer: Self-pay

## 2017-07-27 ENCOUNTER — Inpatient Hospital Stay (HOSPITAL_COMMUNITY): Payer: Medicaid Other

## 2017-07-27 LAB — BASIC METABOLIC PANEL
Anion gap: 16 — ABNORMAL HIGH (ref 5–15)
BUN: 13 mg/dL (ref 6–20)
CHLORIDE: 95 mmol/L — AB (ref 101–111)
CO2: 27 mmol/L (ref 22–32)
Calcium: 9.2 mg/dL (ref 8.9–10.3)
Creatinine, Ser: 0.94 mg/dL (ref 0.61–1.24)
GFR calc non Af Amer: >60 mL/min (ref >60)
Glucose, Bld: 110 mg/dL — ABNORMAL HIGH (ref 65–99)
POTASSIUM: 4 mmol/L (ref 3.5–5.1)
SODIUM: 138 mmol/L (ref 135–145)

## 2017-07-27 LAB — CBC
HCT: 42 % (ref 39.0–52.0)
HEMOGLOBIN: 14.3 g/dL (ref 13.0–17.0)
MCH: 30.8 pg (ref 26.0–34.0)
MCHC: 34 g/dL (ref 30.0–36.0)
MCV: 90.3 fL (ref 78.0–100.0)
Platelets: 442 10*3/uL — ABNORMAL HIGH (ref 150–400)
RBC: 4.65 MIL/uL (ref 4.22–5.81)
RDW: 13.1 % (ref 11.5–15.5)
WBC: 14.1 10*3/uL — AB (ref 4.0–10.5)

## 2017-07-27 MED ORDER — IOPAMIDOL (ISOVUE-300) INJECTION 61%
INTRAVENOUS | Status: AC
  Filled 2017-07-27: qty 30

## 2017-07-27 MED ORDER — IOHEXOL 300 MG/ML  SOLN
100.0000 mL | Freq: Once | INTRAMUSCULAR | Status: AC | PRN
  Administered 2017-07-27: 100 mL via INTRAVENOUS

## 2017-07-27 MED ORDER — LACTATED RINGERS IV SOLN
INTRAVENOUS | Status: AC
Start: 2017-07-27 — End: 2017-07-28
  Administered 2017-07-27 – 2017-07-28 (×3): via INTRAVENOUS
  Administered 2017-07-28: 1000 mL via INTRAVENOUS

## 2017-07-27 MED ORDER — SODIUM CHLORIDE 0.9 % IV SOLN
1.0000 g | INTRAVENOUS | Status: DC
  Administered 2017-07-27 – 2017-07-30 (×4): 1 g via INTRAVENOUS
  Filled 2017-07-27 (×4): qty 1

## 2017-07-27 MED ORDER — IOPAMIDOL (ISOVUE-300) INJECTION 61%
15.0000 mL | Freq: Two times a day (BID) | INTRAVENOUS | Status: DC | PRN
  Administered 2017-07-30: 15 mL via ORAL
  Filled 2017-07-27: qty 30

## 2017-07-27 NOTE — Progress Notes (Signed)
7 Days Post-Op Robotic LAR with diverting loop ileostomy Subjective: Vomiting yesterday.  Ostomy bar removed.  Ostomy output up again, UOP good.  Still not eating much   Objective: Vital signs in last 24 hours: Temp:  [99 F (37.2 C)-99.3 F (37.4 C)] 99 F (37.2 C) (04/11 0525) Pulse Rate:  [77-90] 77 (04/11 0525) Resp:  [16-18] 16 (04/11 0525) BP: (99-117)/(66-76) 99/66 (04/11 0525) SpO2:  [97 %-99 %] 97 % (04/11 0525)   Intake/Output from previous day: 04/10 0701 - 04/11 0700 In: 820 [P.O.:820] Out: 2655 [Urine:930; Stool:1725] Intake/Output this shift: No intake/output data recorded.   General appearance: alert and cooperative GI: normal findings: soft, non distended, appropriately tender  Incision: no significant drainage  Lab Results:  No results for input(s): WBC, HGB, HCT, PLT in the last 72 hours. BMET No results for input(s): NA, K, CL, CO2, GLUCOSE, BUN, CREATININE, CALCIUM in the last 72 hours. PT/INR No results for input(s): LABPROT, INR in the last 72 hours. ABG No results for input(s): PHART, HCO3 in the last 72 hours.  Invalid input(s): PCO2, PO2  MEDS, Scheduled . enoxaparin (LOVENOX) injection  40 mg Subcutaneous Q24H  . feeding supplement  1 Container Oral TID BM  . loperamide  2 mg Oral TID AC & HS    Studies/Results: No results found.  Assessment: s/p Procedure(s): XI ROBOTIC ASSISTED LOWER ANTERIOR RESECTION ERAS PATHWAY DIVERTING LOOP COLOSTOMY Patient Active Problem List   Diagnosis Date Noted  . Genetic testing 04/17/2017  . Port-A-Cath in place 04/17/2017  . Family history of colon cancer 03/29/2017  . Family history of melanoma 03/29/2017  . Family history of pancreatic cancer 03/29/2017  . Family history of testicular cancer 03/29/2017  . Rectal cancer ([place]) 03/17/2017    Post op ileus  Plan: Reg diet as tolerated Restart MIV.   Encourage PO intake as tolerated Ambulate Pain control: wean dilaudid, Oxycodone, tylenol,  Ibuprofen Will recheck labs this am and plan on getting CT later today to check for any post op complications    LOS: 7 days     .Rosario Adie, [facility], [place] [phone]   07/27/2017 8:35 AM

## 2017-07-28 ENCOUNTER — Inpatient Hospital Stay (HOSPITAL_COMMUNITY): Payer: Medicaid Other

## 2017-07-28 ENCOUNTER — Inpatient Hospital Stay: Payer: Self-pay

## 2017-07-28 LAB — CBC
HEMATOCRIT: 41.9 % (ref 39.0–52.0)
HEMOGLOBIN: 14 g/dL (ref 13.0–17.0)
MCH: 30.4 pg (ref 26.0–34.0)
MCHC: 33.4 g/dL (ref 30.0–36.0)
MCV: 90.9 fL (ref 78.0–100.0)
Platelets: 458 10*3/uL — ABNORMAL HIGH (ref 150–400)
RBC: 4.61 MIL/uL (ref 4.22–5.81)
RDW: 13.2 % (ref 11.5–15.5)
WBC: 12.6 10*3/uL — AB (ref 4.0–10.5)

## 2017-07-28 LAB — GLUCOSE, CAPILLARY: Glucose-Capillary: 132 mg/dL — ABNORMAL HIGH (ref 65–99)

## 2017-07-28 MED ORDER — KETOROLAC TROMETHAMINE 15 MG/ML IJ SOLN
15.0000 mg | Freq: Four times a day (QID) | INTRAMUSCULAR | Status: AC | PRN
  Administered 2017-07-28 – 2017-07-31 (×7): 15 mg via INTRAVENOUS
  Filled 2017-07-28 (×7): qty 1

## 2017-07-28 MED ORDER — INSULIN ASPART 100 UNIT/ML ~~LOC~~ SOLN
0.0000 [IU] | SUBCUTANEOUS | Status: DC
  Administered 2017-07-28 – 2017-07-31 (×13): 1 [IU] via SUBCUTANEOUS
  Administered 2017-07-31: 2 [IU] via SUBCUTANEOUS
  Administered 2017-07-31: 1 [IU] via SUBCUTANEOUS
  Administered 2017-07-31: 2 [IU] via SUBCUTANEOUS
  Administered 2017-08-01: 1 [IU] via SUBCUTANEOUS
  Administered 2017-08-01: 2 [IU] via SUBCUTANEOUS
  Administered 2017-08-01 (×2): 1 [IU] via SUBCUTANEOUS
  Administered 2017-08-02: 2 [IU] via SUBCUTANEOUS
  Administered 2017-08-02 (×2): 1 [IU] via SUBCUTANEOUS

## 2017-07-28 MED ORDER — SODIUM CHLORIDE 0.9% FLUSH
10.0000 mL | INTRAVENOUS | Status: DC | PRN
  Administered 2017-08-03 – 2017-08-09 (×3): 10 mL
  Administered 2017-08-12: 20 mL
  Administered 2017-08-17 – 2017-08-29 (×2): 10 mL
  Administered 2017-08-29: 20 mL
  Filled 2017-07-28 (×7): qty 40

## 2017-07-28 MED ORDER — LACTATED RINGERS IV SOLN
INTRAVENOUS | Status: DC
  Administered 2017-07-28 (×2): via INTRAVENOUS

## 2017-07-28 MED ORDER — METHOCARBAMOL 1000 MG/10ML IJ SOLN
500.0000 mg | Freq: Four times a day (QID) | INTRAVENOUS | Status: DC | PRN
  Administered 2017-07-28 – 2017-07-30 (×4): 500 mg via INTRAVENOUS
  Filled 2017-07-28 (×4): qty 550

## 2017-07-28 MED ORDER — M.V.I. ADULT IV INJ
INJECTION | INTRAVENOUS | Status: AC
  Administered 2017-07-28: 18:00:00 via INTRAVENOUS
  Filled 2017-07-28: qty 720

## 2017-07-28 MED ORDER — FAT EMULSION PLANT BASED 20 % IV EMUL
240.0000 mL | INTRAVENOUS | Status: AC
  Administered 2017-07-28: 240 mL via INTRAVENOUS
  Filled 2017-07-28: qty 250

## 2017-07-28 NOTE — Progress Notes (Signed)
Notified doctor about pt's concern of the hardness of his abdomen, which he noticed more when he stood up to walk.  Will continue to monitor, no orders received.

## 2017-07-28 NOTE — Progress Notes (Addendum)
[facility] NOTE   Pharmacy Consult for TPN Indication: prolonged NPO status  Patient Measurements: Height: 6\' 2"  (188 cm) Weight: 153 lb 3.5 oz (69.5 kg) IBW/kg (Calculated) : 82.2 TPN AdjBW (KG): 73 Body mass index is 19.67 kg/m. Usual Weight: 82 kg  Insulin Requirements: none (no Hx DM)  Current Nutrition: NPO + ice chips  IVF: LR at 125  Central access: double lumen PAC TPN start date: 07/28/17  ASSESSMENT                                                                                                          HPI: Jacob Galvan with PMH colorectal cancer s/p chemo/rads and now s/p lower anterior resection with diverting loop ileostomy performed on 4/4. On 4/11 the patient developed post-op ileus and was made NPO. Also treating for potential anastomotic leak seen on CT as small presacral fluid collection. The patient's nutritional status has been poor for quite some time despite being on a diet until recently. Pharmacy consulted to start TPN. Spoke with RD; considering patient high risk for refeeding given prolonged poor PO intake.  Significant events:   Today: (last labs done 4/11)  Glucose - CBGs < 150 off TPN  Electrolytes - K, Na wnl; Cl slightly low. Recent albumin not available to calculate corrected Ca. Mg, Phos pending for tomorrow  Renal -SCr WNL, stable at baseline  LFTs - pending  TGs - pending  Prealbumin - pending  NUTRITIONAL GOALS                                                                                             RD recs: Kcal:  2000-2200 (29-32 kcal/kg) Protein:  80-100 grams/day (1.2-1.4 grams/kg) Fluid:  2.0-2.2 L/day  Keep K >/= 4.0, Mg >/= 2.0 with ileus  Clinimix E 5/20 at a goal rate of 75 ml/hr + 20% fat emulsion at 20 ml/hr x 12 hr to provide: 90 g/day protein, 2169 Kcal/day.  PLAN  At 1800 today:  Start Clinimix E 5/20 at 30 ml/hr given refeeding risk  20% fat emulsion at 20 ml/hr  Plan to advance as tolerated to the goal rate  TPN to contain standard multivitamins and trace elements  Reduce IVF to 95 ml/hr, consider changing to NS if lytes stable and Surgery has no objections  Start sensitive SSI with q4 hr CBG checks  BMP, Mg, Phos x 3 days  TPN lab panels on Mondays & Thursdays  F/u daily  Reuel Boom, PharmD, BCPS [phone] 07/28/2017, 2:05 PM

## 2017-07-28 NOTE — Progress Notes (Signed)
Initial Nutrition Assessment  DOCUMENTATION CODES:   Severe malnutrition in context of chronic illness  INTERVENTION:   - TPN initiation and titration per pharmacy. Kcal: 2000-2200 kcal/day Protein: 80-100 grams/day  Monitor magnesium, potassium, and phosphorus daily for at least 3 days, MD to replete as needed, as pt is at risk for refeeding syndrome given severe protein-calorie malnutrition and minimal PO intake x 1 week.  NUTRITION DIAGNOSIS:   Severe Malnutrition related to chronic illness, cancer and cancer related treatments as evidenced by moderate fat depletion, severe fat depletion, moderate muscle depletion, severe muscle depletion.  GOAL:  Patient will meet greater than or equal to 90% of their needs  MONITOR:  Labs, I & O's, Skin, Weight trends, Diet advancement  REASON FOR ASSESSMENT:     New TPN/TNA  ASSESSMENT:   42 year old male with PMH significant for stage II colorectal cancer s/p chemo and radiation.  07/20/17 - s/p robotic assisted lower anterior resection with splenic flexure mobilization and diverting loop ileostomy 07/27/17 - CT shows ileus with small presacral fluid collection 07/28/17 - NPO, TPN  Spoke with pt at bedside who reports having no appetite and barely eating since surgery on 07/20/17. Pt states that prior to surgery, he had an "excellent" appetite and ate 3 meals daily. Pt occasionally drank Ensure oral nutrition supplements during chemo and radiation. Pt was receiving Ensure during current admission but was unable to drink it.  Discussed TPN with pt and answered questions. Per RN note, plans to start TPN today through port a cath. Also spoke with pharmacy regarding pt's refeeding risk and need to monitor electrolyte labs.  Pt states his UBW prior to weight loss during chemo and radiation was 182 lbs and that he last weighed this in 2016. Pt reports continuously losing weight over the past several months. Unable to confirm with weight history  in chart, but it appears that pt is well below his baseline weight. Pt reports his lowest weight as 132 lbs during radiation and that he has gained some back but has started losing again.  Medications reviewed and include: IV lactated ringers  Labs reviewed. Potassium WNL. No magnesium or phosphorus labs available.  NUTRITION - FOCUSED PHYSICAL EXAM:    Most Recent Value  Orbital Region  Moderate depletion  Upper Arm Region  Severe depletion  Thoracic and Lumbar Region  Severe depletion  Buccal Region  Severe depletion  Temple Region  Moderate depletion  Clavicle Bone Region  Severe depletion  Clavicle and Acromion Bone Region  Severe depletion  Scapular Bone Region  Moderate depletion  Dorsal Hand  Moderate depletion  Patellar Region  Severe depletion  Anterior Thigh Region  Severe depletion  Posterior Calf Region  Severe depletion  Edema (RD Assessment)  None  Hair  Reviewed  Eyes  Reviewed  Mouth  Reviewed  Skin  Reviewed  Nails  Reviewed       Diet Order:  Diet NPO time specified Except for: [employer], Sips with Meds  EDUCATION NEEDS:   No education needs have been identified at this time  Skin:  Skin Assessment: Skin Integrity Issues: Skin Integrity Issues:: Incisions Incisions: surgical incision to abdomen  Last BM:  07/28/17 large type 7 via ileostomy  Height:   Ht Readings from Last 1 Encounters:  07/20/17 6\' 2"  (1.88 m)    Weight:   Wt Readings from Last 1 Encounters:  07/24/17 153 lb 3.5 oz (69.5 kg)    Ideal Body Weight:  86.4 kg  BMI:  Body mass index is 19.67 kg/m.  Estimated Nutritional Needs:   Kcal:  2000-2200 (29-32 kcal/kg)  Protein:  80-100 grams/day (1.2-1.4 grams/kg)  Fluid:  2.0-2.2 L/day    Gaynell Face, MS, RD, LDN Pager: [phone] Weekend/After Hours: [phone]

## 2017-07-28 NOTE — Progress Notes (Signed)
8 Days Post-Op Robotic LAR with diverting loop ileostomy Subjective: Still with abd pain as well as lower back pain. CT completed yesterday shows small presacral fluid collection  Objective: Vital signs in last 24 hours: Temp:  [98.7 F (37.1 C)-98.8 F (37.1 C)] 98.7 F (37.1 C) (04/12 0541) Pulse Rate:  [82-94] 94 (04/12 0541) Resp:  [13] 13 (04/12 0541) BP: (96-100)/(66-68) 96/68 (04/12 0541) SpO2:  [98 %-99 %] 98 % (04/12 0541)   Intake/Output from previous day: 04/11 0701 - 04/12 0700 In: 2041.7 [I.V.:2041.7] Out: 2625 [Urine:750; SAYTK:1601] Intake/Output this shift: No intake/output data recorded.   General appearance: alert and cooperative GI: normal findings: soft, mildly distended, appropriately tender  Incision: no significant drainage  Lab Results:  Recent Labs    07/27/17 0927 07/28/17 0442  WBC 14.1* 12.6*  HGB 14.3 14.0  HCT 42.0 41.9  PLT 442* 458*   BMET Recent Labs    07/27/17 0927  NA 138  K 4.0  CL 95*  CO2 27  GLUCOSE 110*  BUN 13  CREATININE 0.94  CALCIUM 9.2   PT/INR No results for input(s): LABPROT, INR in the last 72 hours. ABG No results for input(s): PHART, HCO3 in the last 72 hours.  Invalid input(s): PCO2, PO2  MEDS, Scheduled . enoxaparin (LOVENOX) injection  40 mg Subcutaneous Q24H    Studies/Results: Ct Abdomen Pelvis W Contrast  Result Date: 07/27/2017 CLINICAL DATA:  Abdominal distention and nausea with vomiting. 7 days postop from low anterior resection with diverting loop ileostomy. EXAM: CT ABDOMEN AND PELVIS WITH CONTRAST TECHNIQUE: Multidetector CT imaging of the abdomen and pelvis was performed using the standard protocol following bolus administration of intravenous contrast. CONTRAST:  149mL OMNIPAQUE IOHEXOL 300 MG/ML  SOLN COMPARISON:  MRI on 06/28/2017 and 06/26/2017 FINDINGS: Lower Chest: No acute findings. Hepatobiliary: Peripherally enhancing lesion in the anterior left hepatic lobe measures 1.6 x 1.3  cm, without significant change since previous study. This is consistent with a liver metastasis. A tiny sub-cm cyst in the anterior segment of the right lobe is unchanged. No new or enlarging liver lesions identified. Gallbladder is unremarkable. Pancreas:  No mass or inflammatory changes. Spleen: Within normal limits in size and appearance. Adrenals/Urinary Tract: No masses identified. No evidence of hydronephrosis. Small amount of gas in urinary bladder is likely due to recent catheterization. Stomach/Bowel: Expected postop changes from low anterior resection and right lower quadrant ileostomy. A small fluid collection is seen in the presacral space measuring approximately 2.7 x 4.7 cm on image 79/2. No other abnormal fluid collections or free intraperitoneal air. Severe dilatation of small bowel is seen with multiple air-fluid levels. There appears to be a transition point to nondilated small bowel loops in the central pelvis, suspicious for adhesion. Vascular/Lymphatic: Mild retroperitoneal lymphadenopathy is seen in the left paraaortic region which is new since previous study. Largest index lymph node measures 10 mm on image 35/2. No other sites of lymphadenopathy identified. Reproductive:  No mass or other significant abnormality. Other:  None. Musculoskeletal:  No suspicious bone lesions identified. IMPRESSION: Recent postop changes from low anterior resection and right lower quadrant ileostomy. Small presacral fluid collection measuring 2.7 x 4.7 cm. Marked small bowel dilatation, with transition point in central pelvis, suspicious for adhesion. New mild retroperitoneal lymphadenopathy in left paraaortic region, which is indeterminate. This is likely reactive in etiology, although metastatic disease cannot definitely be excluded. Recommend continued attention on follow-up imaging. Stable small left hepatic lobe metastasis. Electronically Signed   By:  Earle Gell M.D.   On: 07/27/2017 16:08   [place] Ekg Site  Rite  Result Date: 07/28/2017 If Site Rite image not attached, placement could not be confirmed due to current cardiac rhythm.   Assessment: s/p Procedure(s): XI ROBOTIC ASSISTED LOWER ANTERIOR RESECTION ERAS PATHWAY DIVERTING LOOP COLOSTOMY Patient Active Problem List   Diagnosis Date Noted  . Genetic testing 04/17/2017  . Port-A-Cath in place 04/17/2017  . Family history of colon cancer 03/29/2017  . Family history of melanoma 03/29/2017  . Family history of pancreatic cancer 03/29/2017  . Family history of testicular cancer 03/29/2017  . Rectal cancer ([place]) 03/17/2017    Post op ileus.  CT shows ileus with presacral fluid collection.  Given his clinical picture I am going to treat this as an anastomotic leak.  Plan: NPO, sips of clears Cont MIV.   Insert PICC and start TPN TOradol and Robaxin added Ambulate Pain control: IV meds for now Invanz for pelvic fluid collection.  I don't think IR drain is warranted unless he fails to improve clinically with abx and bowel rest.     LOS: 8 days     .Rosario Adie, [facility], [place]   07/28/2017 8:24 AM

## 2017-07-28 NOTE — Progress Notes (Signed)
Contacted Dr. Marcello Moores concerning accessing pt's port a cath instead of PICC line placement.  The ok was given to proceed with using the port a cath.

## 2017-07-29 DIAGNOSIS — E440 Moderate protein-calorie malnutrition: Secondary | ICD-10-CM

## 2017-07-29 LAB — COMPREHENSIVE METABOLIC PANEL
ALT: 44 U/L (ref 17–63)
AST: 20 U/L (ref 15–41)
Albumin: 3 g/dL — ABNORMAL LOW (ref 3.5–5.0)
Alkaline Phosphatase: 81 U/L (ref 38–126)
Anion gap: 15 (ref 5–15)
BUN: 17 mg/dL (ref 6–20)
CHLORIDE: 89 mmol/L — AB (ref 101–111)
CO2: 33 mmol/L — ABNORMAL HIGH (ref 22–32)
CREATININE: 1.07 mg/dL (ref 0.61–1.24)
Calcium: 8.8 mg/dL — ABNORMAL LOW (ref 8.9–10.3)
GFR calc Af Amer: >60 mL/min (ref >60)
GFR calc non Af Amer: >60 mL/min (ref >60)
GLUCOSE: 129 mg/dL — AB (ref 65–99)
POTASSIUM: 3.3 mmol/L — AB (ref 3.5–5.1)
Sodium: 137 mmol/L (ref 135–145)
Total Bilirubin: 0.4 mg/dL (ref 0.3–1.2)
Total Protein: 7.3 g/dL (ref 6.5–8.1)

## 2017-07-29 LAB — MAGNESIUM: Magnesium: 2.2 mg/dL (ref 1.7–2.4)

## 2017-07-29 LAB — CBC
HEMATOCRIT: 41.8 % (ref 39.0–52.0)
HEMOGLOBIN: 14 g/dL (ref 13.0–17.0)
MCH: 30.4 pg (ref 26.0–34.0)
MCHC: 33.5 g/dL (ref 30.0–36.0)
MCV: 90.7 fL (ref 78.0–100.0)
Platelets: 578 10*3/uL — ABNORMAL HIGH (ref 150–400)
RBC: 4.61 MIL/uL (ref 4.22–5.81)
RDW: 13 % (ref 11.5–15.5)
WBC: 13.4 10*3/uL — ABNORMAL HIGH (ref 4.0–10.5)

## 2017-07-29 LAB — GLUCOSE, CAPILLARY
GLUCOSE-CAPILLARY: 125 mg/dL — AB (ref 65–99)
GLUCOSE-CAPILLARY: 131 mg/dL — AB (ref 65–99)
GLUCOSE-CAPILLARY: 137 mg/dL — AB (ref 65–99)
GLUCOSE-CAPILLARY: 141 mg/dL — AB (ref 65–99)
Glucose-Capillary: 121 mg/dL — ABNORMAL HIGH (ref 65–99)
Glucose-Capillary: 129 mg/dL — ABNORMAL HIGH (ref 65–99)
Glucose-Capillary: 149 mg/dL — ABNORMAL HIGH (ref 65–99)

## 2017-07-29 LAB — PHOSPHORUS: Phosphorus: 4 mg/dL (ref 2.5–4.6)

## 2017-07-29 MED ORDER — LACTATED RINGERS IV SOLN
INTRAVENOUS | Status: DC
  Administered 2017-07-29 – 2017-07-30 (×2): via INTRAVENOUS

## 2017-07-29 MED ORDER — M.V.I. ADULT IV INJ
INJECTION | INTRAVENOUS | Status: AC
  Administered 2017-07-29: 17:00:00 via INTRAVENOUS
  Filled 2017-07-29: qty 1200

## 2017-07-29 MED ORDER — FAT EMULSION PLANT BASED 20 % IV EMUL
240.0000 mL | INTRAVENOUS | Status: AC
  Administered 2017-07-29: 240 mL via INTRAVENOUS
  Filled 2017-07-29: qty 250

## 2017-07-29 MED ORDER — POTASSIUM CHLORIDE 10 MEQ/100ML IV SOLN
10.0000 meq | INTRAVENOUS | Status: AC
  Administered 2017-07-29 (×4): 10 meq via INTRAVENOUS
  Filled 2017-07-29 (×4): qty 100

## 2017-07-29 NOTE — Progress Notes (Signed)
[facility] NOTE   Pharmacy Consult for TPN Indication: prolonged NPO status  Patient Measurements: Height: 6\' 2"  (188 cm) Weight: 153 lb 3.5 oz (69.5 kg) IBW/kg (Calculated) : 82.2 TPN AdjBW (KG): 73 Body mass index is 19.67 kg/m. Usual Weight: 82 kg  Insulin Requirements: 3 units SSI last 24hr  Current Nutrition: NPO + ice chips  IVF: LR at 95 ml/hr  Central access: double lumen PAC TPN start date: 07/28/17  ASSESSMENT                                                                                                          HPI: 21 yoM with PMH colorectal cancer s/p chemo/rads and now s/p lower anterior resection with diverting loop ileostomy performed on 4/4. On 4/11 the patient developed post-op ileus and was made NPO. Also treating for potential anastomotic leak seen on CT as small presacral fluid collection. The patient's nutritional status has been poor for quite some time despite being on a diet until recently. Pharmacy consulted to start TPN. Spoke with RD; considering patient high risk for refeeding given prolonged poor PO intake.  Significant events:   Today: (last labs done 4/11)  Glucose - CBGs stable  Electrolytes - WNL except K 3.3 - will replace  Renal -SCr WNL, stable at baseline  LFTs - WNL  TGs - pending  Prealbumin - pending  NUTRITIONAL GOALS                                                                                             RD recs: Kcal:  2000-2200 (29-32 kcal/kg) Protein:  80-100 grams/day (1.2-1.4 grams/kg) Fluid:  2.0-2.2 L/day  Keep K >/= 4.0, Mg >/= 2.0 with ileus  Clinimix E 5/20 at a goal rate of 75 ml/hr + 20% fat emulsion at 20 ml/hr x 12 hr to provide: 90 g/day protein, 2169 Kcal/day.  PLAN                                                                                                                         At 1800 today:  Increase Clinimix E 5/20 to 50 ml/hr  given refeeding  risk  20% fat emulsion at 20 ml/hr  Plan to advance as tolerated to the goal rate  TPN to contain standard multivitamins and trace elements  Reduce IVF to 75 ml/hr, consider changing to NS if lytes stable and Surgery has no objections  For K 3.3, given 4 runs KCL IV  Start sensitive SSI with q4 hr CBG checks  BMP, Mg, Phos x 3 days  TPN lab panels on Mondays & Thursdays  F/u daily   Adrian Saran, PharmD, BCPS Pager [phone] 07/29/2017 7:13 AM

## 2017-07-29 NOTE — Progress Notes (Signed)
Patient ID: Jacob Galvan., male   DOB: Aug 13, 1975, 42 y.o.   MRN: [id number] [facility] Surgery Progress Note:   9 Days Post-Op  Subjective: Mental status is clear.   Objective: Vital signs in last 24 hours: Temp:  [98.7 F (37.1 C)-99.2 F (37.3 C)] 99.2 F (37.3 C) (04/13 0358) Pulse Rate:  [79-107] 79 (04/13 0358) Resp:  [14-20] 18 (04/13 0358) BP: (101-111)/(71-85) 102/71 (04/13 0358) SpO2:  [97 %-99 %] 98 % (04/13 0358)  Intake/Output from previous day: 04/12 0701 - 04/13 0700 In: 3427.8 [P.O.:130; I.V.:3242.8; IV Piggyback:55] Out: 7824 [Urine:925; MPNTI:1443] Intake/Output this shift: No intake/output data recorded.  Physical Exam: Work of breathing is not labored.  Thin man (malnourished) with TNA.  Ileostomy in place.  No flatus as expected.  Pain is no worse.    Lab Results:  Results for orders placed or performed during the hospital encounter of 07/20/17 (from the past 48 hour(s))  CBC     Status: Abnormal   Collection Time: 07/27/17  9:27 AM  Result Value Ref Range   WBC 14.1 (H) 4.0 - 10.5 K/uL   RBC 4.65 4.22 - 5.81 MIL/uL   Hemoglobin 14.3 13.0 - 17.0 g/dL   HCT 42.0 39.0 - 52.0 %   MCV 90.3 78.0 - 100.0 fL   MCH 30.8 26.0 - 34.0 pg   MCHC 34.0 30.0 - 36.0 g/dL   RDW 13.1 11.5 - 15.5 %   Platelets 442 (H) 150 - 400 K/uL    Comment: Performed at [facility], [place] [address] [place], [place] [address]  Basic metabolic panel     Status: Abnormal   Collection Time: 07/27/17  9:27 AM  Result Value Ref Range   Sodium 138 135 - 145 mmol/L   Potassium 4.0 3.5 - 5.1 mmol/L   Chloride 95 (L) 101 - 111 mmol/L   CO2 27 22 - 32 mmol/L   Glucose, Bld 110 (H) 65 - 99 mg/dL   BUN 13 6 - 20 mg/dL   Creatinine, Ser 0.94 0.61 - 1.24 mg/dL   Calcium 9.2 8.9 - 10.3 mg/dL   GFR calc non Af Amer >60 >60 mL/min   GFR calc Af Amer >60 >60 mL/min    Comment: (NOTE) The eGFR has been calculated using the CKD EPI equation. This calculation has not  been validated in all clinical situations. eGFR's persistently <60 mL/min signify possible Chronic Kidney Disease.    Anion gap 16 (H) 5 - 15    Comment: Performed at [facility], [place] [address] [place], [place] [address]  CBC     Status: Abnormal   Collection Time: 07/28/17  4:42 AM  Result Value Ref Range   WBC 12.6 (H) 4.0 - 10.5 K/uL   RBC 4.61 4.22 - 5.81 MIL/uL   Hemoglobin 14.0 13.0 - 17.0 g/dL   HCT 41.9 39.0 - 52.0 %   MCV 90.9 78.0 - 100.0 fL   MCH 30.4 26.0 - 34.0 pg   MCHC 33.4 30.0 - 36.0 g/dL   RDW 13.2 11.5 - 15.5 %   Platelets 458 (H) 150 - 400 K/uL    Comment: Performed at [facility], [place] [address], [place], [place] [address]  Glucose, capillary     Status: Abnormal   Collection Time: 07/28/17  9:13 PM  Result Value Ref Range   Glucose-Capillary 132 (H) 65 - 99 mg/dL  Glucose, capillary     Status: Abnormal  Collection Time: 07/29/17 12:02 AM  Result Value Ref Range   Glucose-Capillary 129 (H) 65 - 99 mg/dL  Glucose, capillary     Status: Abnormal   Collection Time: 07/29/17  3:55 AM  Result Value Ref Range   Glucose-Capillary 121 (H) 65 - 99 mg/dL  Comprehensive metabolic panel     Status: Abnormal   Collection Time: 07/29/17  4:04 AM  Result Value Ref Range   Sodium 137 135 - 145 mmol/L   Potassium 3.3 (L) 3.5 - 5.1 mmol/L    Comment: DELTA CHECK NOTED NO VISIBLE HEMOLYSIS    Chloride 89 (L) 101 - 111 mmol/L   CO2 33 (H) 22 - 32 mmol/L   Glucose, Bld 129 (H) 65 - 99 mg/dL   BUN 17 6 - 20 mg/dL   Creatinine, Ser 1.07 0.61 - 1.24 mg/dL   Calcium 8.8 (L) 8.9 - 10.3 mg/dL   Total Protein 7.3 6.5 - 8.1 g/dL   Albumin 3.0 (L) 3.5 - 5.0 g/dL   AST 20 15 - 41 U/L   ALT 44 17 - 63 U/L   Alkaline Phosphatase 81 38 - 126 U/L   Total Bilirubin 0.4 0.3 - 1.2 mg/dL   GFR calc non Af Amer >60 >60 mL/min   GFR calc Af Amer >60 >60 mL/min    Comment: (NOTE) The eGFR has been calculated using the CKD EPI  equation. This calculation has not been validated in all clinical situations. eGFR's persistently <60 mL/min signify possible Chronic Kidney Disease.    Anion gap 15 5 - 15    Comment: Performed at [facility], [place] [address] [place], [place] [address]  Magnesium     Status: None   Collection Time: 07/29/17  4:04 AM  Result Value Ref Range   Magnesium 2.2 1.7 - 2.4 mg/dL    Comment: Performed at [facility], [place] [address], [place], [place] [address]  Phosphorus     Status: None   Collection Time: 07/29/17  4:04 AM  Result Value Ref Range   Phosphorus 4.0 2.5 - 4.6 mg/dL    Comment: Performed at [facility], [place] [address], [place], [place] [address]  CBC     Status: Abnormal   Collection Time: 07/29/17  4:04 AM  Result Value Ref Range   WBC 13.4 (H) 4.0 - 10.5 K/uL   RBC 4.61 4.22 - 5.81 MIL/uL   Hemoglobin 14.0 13.0 - 17.0 g/dL   HCT 41.8 39.0 - 52.0 %   MCV 90.7 78.0 - 100.0 fL   MCH 30.4 26.0 - 34.0 pg   MCHC 33.5 30.0 - 36.0 g/dL   RDW 13.0 11.5 - 15.5 %   Platelets 578 (H) 150 - 400 K/uL    Comment: Performed at [facility], [place] [address] [place],  [address]  Glucose, capillary     Status: Abnormal   Collection Time: 07/29/17  7:37 AM  Result Value Ref Range   Glucose-Capillary 125 (H) 65 - 99 mg/dL    Radiology/Results: Ct Abdomen Pelvis W Contrast  Result Date: 07/27/2017 CLINICAL DATA:  Abdominal distention and nausea with vomiting. 7 days postop from low anterior resection with diverting loop ileostomy. EXAM: CT ABDOMEN AND PELVIS WITH CONTRAST TECHNIQUE: Multidetector CT imaging of the abdomen and pelvis was performed using the standard protocol following bolus administration of intravenous contrast. CONTRAST:  187m OMNIPAQUE IOHEXOL 300 MG/ML  SOLN COMPARISON:  MRI on 06/28/2017 and 06/26/2017 FINDINGS:  Lower Chest: No acute findings. Hepatobiliary: Peripherally enhancing  lesion in the anterior left hepatic lobe measures 1.6 x 1.3 cm, without significant change since previous study. This is consistent with a liver metastasis. A tiny sub-cm cyst in the anterior segment of the right lobe is unchanged. No new or enlarging liver lesions identified. Gallbladder is unremarkable. Pancreas:  No mass or inflammatory changes. Spleen: Within normal limits in size and appearance. Adrenals/Urinary Tract: No masses identified. No evidence of hydronephrosis. Small amount of gas in urinary bladder is likely due to recent catheterization. Stomach/Bowel: Expected postop changes from low anterior resection and right lower quadrant ileostomy. A small fluid collection is seen in the presacral space measuring approximately 2.7 x 4.7 cm on image 79/2. No other abnormal fluid collections or free intraperitoneal air. Severe dilatation of small bowel is seen with multiple air-fluid levels. There appears to be a transition point to nondilated small bowel loops in the central pelvis, suspicious for adhesion. Vascular/Lymphatic: Mild retroperitoneal lymphadenopathy is seen in the left paraaortic region which is new since previous study. Largest index lymph node measures 10 mm on image 35/2. No other sites of lymphadenopathy identified. Reproductive:  No mass or other significant abnormality. Other:  None. Musculoskeletal:  No suspicious bone lesions identified. IMPRESSION: Recent postop changes from low anterior resection and right lower quadrant ileostomy. Small presacral fluid collection measuring 2.7 x 4.7 cm. Marked small bowel dilatation, with transition point in central pelvis, suspicious for adhesion. New mild retroperitoneal lymphadenopathy in left paraaortic region, which is indeterminate. This is likely reactive in etiology, although metastatic disease cannot definitely be excluded. Recommend continued attention on follow-up imaging. Stable small left hepatic lobe metastasis. Electronically Signed    By: Earle Gell M.D.   On: 07/27/2017 16:08   Dg Chest Port 1 View  Result Date: 07/28/2017 CLINICAL DATA:  Confirm position of the Port-A-Cath tip. EXAM: PORTABLE CHEST 1 VIEW COMPARISON:  Chest radiograph from 03/04/2017. FINDINGS: There is a dual lumen right chest Port-A-Cath which is CT injectable. The catheter tip is at the superior cavoatrial junction. No evidence for catheter discontinuity. Both lungs are clear. Heart and mediastinum are within normal limits. Trachea is midline. Negative for pneumothorax. 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 IMPRESSION: Catheter tip in the region of the superior cavoatrial junction. Stable position based on previous examination from 2018. Electronically Signed   By: Markus Daft M.D.   On: 07/28/2017 13:00   [place] Ekg Site Rite  Result Date: 07/28/2017 If Site Rite image not attached, placement could not be confirmed due to current cardiac rhythm.   Anti-infectives: Anti-infectives (From admission, onward)   Start     Dose/Rate Route Frequency Ordered Stop   07/27/17 1800  ertapenem (INVANZ) 1 g in sodium chloride 0.9 % 100 mL IVPB     1 g 200 mL/hr over 30 Minutes Intravenous Every 24 hours 07/27/17 1632     07/20/17 2000  cefoTEtan (CEFOTAN) 2 g in sodium chloride 0.9 % 100 mL IVPB  Status:  Discontinued     2 g 200 mL/hr over 30 Minutes Intravenous Every 12 hours 07/20/17 1346 07/20/17 1415   07/20/17 2000  cefoTEtan in Dextrose 5% (CEFOTAN) IVPB 2 g     2 g Intravenous Every 12 hours 07/20/17 1415 07/20/17 1930   07/20/17 0540  cefoTEtan in Dextrose 5% (CEFOTAN) IVPB 2 g     2 g Intravenous On call to O.R.  07/20/17 0540 07/20/17 0817      Assessment/Plan: Problem List: Patient Active Problem List   Diagnosis Date Noted  . Protein-calorie malnutrition, severe 07/29/2017  . Genetic testing 04/17/2017  . Port-A-Cath in place 04/17/2017  . Family history of  colon cancer 03/29/2017  . Family history of melanoma 03/29/2017  . Family history of pancreatic cancer 03/29/2017  . Family history of testicular cancer 03/29/2017  . Rectal cancer ([place]) 03/17/2017    Empiric treatment for possible leak with abx and observation.   9 Days Post-Op    LOS: 9 days   Matt B. Hassell Done, MD, [facility] [phone] beeper [phone]  07/29/2017 8:34 AM

## 2017-07-30 ENCOUNTER — Encounter (HOSPITAL_COMMUNITY): Payer: Self-pay | Admitting: Surgery

## 2017-07-30 ENCOUNTER — Inpatient Hospital Stay (HOSPITAL_COMMUNITY): Payer: Medicaid Other

## 2017-07-30 DIAGNOSIS — K567 Ileus, unspecified: Secondary | ICD-10-CM

## 2017-07-30 DIAGNOSIS — Z932 Ileostomy status: Secondary | ICD-10-CM

## 2017-07-30 DIAGNOSIS — E876 Hypokalemia: Secondary | ICD-10-CM

## 2017-07-30 DIAGNOSIS — K9189 Other postprocedural complications and disorders of digestive system: Secondary | ICD-10-CM

## 2017-07-30 LAB — COMPREHENSIVE METABOLIC PANEL
ALT: 27 U/L (ref 17–63)
AST: 14 U/L — AB (ref 15–41)
Albumin: 2.8 g/dL — ABNORMAL LOW (ref 3.5–5.0)
Alkaline Phosphatase: 75 U/L (ref 38–126)
Anion gap: 12 (ref 5–15)
BILIRUBIN TOTAL: 0.3 mg/dL (ref 0.3–1.2)
BUN: 14 mg/dL (ref 6–20)
CO2: 32 mmol/L (ref 22–32)
CREATININE: 0.85 mg/dL (ref 0.61–1.24)
Calcium: 8.5 mg/dL — ABNORMAL LOW (ref 8.9–10.3)
Chloride: 92 mmol/L — ABNORMAL LOW (ref 101–111)
GFR calc non Af Amer: >60 mL/min (ref >60)
Glucose, Bld: 144 mg/dL — ABNORMAL HIGH (ref 65–99)
POTASSIUM: 3.3 mmol/L — AB (ref 3.5–5.1)
Sodium: 136 mmol/L (ref 135–145)
Total Protein: 6.9 g/dL (ref 6.5–8.1)

## 2017-07-30 LAB — PREALBUMIN: Prealbumin: 10.5 mg/dL — ABNORMAL LOW (ref 18–38)

## 2017-07-30 LAB — MAGNESIUM: Magnesium: 2 mg/dL (ref 1.7–2.4)

## 2017-07-30 LAB — GLUCOSE, CAPILLARY
GLUCOSE-CAPILLARY: 147 mg/dL — AB (ref 65–99)
GLUCOSE-CAPILLARY: 117 mg/dL — AB (ref 65–99)
Glucose-Capillary: 132 mg/dL — ABNORMAL HIGH (ref 65–99)
Glucose-Capillary: 133 mg/dL — ABNORMAL HIGH (ref 65–99)
Glucose-Capillary: 150 mg/dL — ABNORMAL HIGH (ref 65–99)
Glucose-Capillary: 154 mg/dL — ABNORMAL HIGH (ref 65–99)

## 2017-07-30 LAB — PHOSPHORUS: PHOSPHORUS: 3.9 mg/dL (ref 2.5–4.6)

## 2017-07-30 LAB — TRIGLYCERIDES: TRIGLYCERIDES: 142 mg/dL (ref <150)

## 2017-07-30 MED ORDER — METHOCARBAMOL 500 MG PO TABS
1000.0000 mg | ORAL_TABLET | Freq: Four times a day (QID) | ORAL | Status: DC | PRN
  Administered 2017-07-31 – 2017-08-16 (×3): 1000 mg via ORAL
  Filled 2017-07-30 (×3): qty 2

## 2017-07-30 MED ORDER — METHOCARBAMOL 1000 MG/10ML IJ SOLN: 1000.0000 mg | Freq: Four times a day (QID) | INTRAVENOUS | Status: DC | PRN

## 2017-07-30 MED ORDER — IOPAMIDOL (ISOVUE-300) INJECTION 61%
INTRAVENOUS | Status: AC
  Filled 2017-07-30: qty 30

## 2017-07-30 MED ORDER — LACTATED RINGERS IV SOLN
INTRAVENOUS | Status: AC
  Administered 2017-07-30 – 2017-07-31 (×2): via INTRAVENOUS

## 2017-07-30 MED ORDER — SIMETHICONE 40 MG/0.6ML PO SUSP
80.0000 mg | Freq: Three times a day (TID) | ORAL | Status: AC
  Administered 2017-07-30 – 2017-08-15 (×36): 80 mg via ORAL
  Filled 2017-07-30 (×37): qty 1.2

## 2017-07-30 MED ORDER — GABAPENTIN 300 MG PO CAPS
300.0000 mg | ORAL_CAPSULE | Freq: Every day | ORAL | Status: AC
  Administered 2017-07-30 – 2017-07-31 (×2): 300 mg via ORAL
  Filled 2017-07-30 (×2): qty 1

## 2017-07-30 MED ORDER — POTASSIUM CHLORIDE 10 MEQ/100ML IV SOLN
10.0000 meq | INTRAVENOUS | Status: AC
  Administered 2017-07-30 (×4): 10 meq via INTRAVENOUS
  Filled 2017-07-30 (×3): qty 100

## 2017-07-30 MED ORDER — LIP MEDEX EX OINT
1.0000 "application " | TOPICAL_OINTMENT | Freq: Two times a day (BID) | CUTANEOUS | Status: DC
  Administered 2017-07-30 – 2017-08-28 (×52): 1 via TOPICAL
  Filled 2017-07-30 (×7): qty 7

## 2017-07-30 MED ORDER — SIMETHICONE 40 MG/0.6ML PO SUSP: 40.0000 mg | Freq: Four times a day (QID) | ORAL | Status: DC | PRN

## 2017-07-30 MED ORDER — OXYMETAZOLINE HCL 0.05 % NA SOLN
1.0000 | Freq: Two times a day (BID) | NASAL | Status: DC
  Administered 2017-07-30 – 2017-08-22 (×7): 1 via NASAL
  Filled 2017-07-30: qty 15

## 2017-07-30 MED ORDER — ACETAMINOPHEN 500 MG PO TABS
1000.0000 mg | ORAL_TABLET | Freq: Three times a day (TID) | ORAL | Status: DC
  Administered 2017-08-05 – 2017-08-16 (×8): 1000 mg via ORAL
  Filled 2017-07-30 (×22): qty 2

## 2017-07-30 MED ORDER — IOPAMIDOL (ISOVUE-300) INJECTION 61%
100.0000 mL | Freq: Once | INTRAVENOUS | Status: AC | PRN
  Administered 2017-07-30: 100 mL via INTRAVENOUS

## 2017-07-30 MED ORDER — MAGIC MOUTHWASH
15.0000 mL | Freq: Four times a day (QID) | ORAL | Status: DC | PRN
  Administered 2017-08-05: 15 mL via ORAL
  Filled 2017-07-30 (×2): qty 15

## 2017-07-30 MED ORDER — FAT EMULSION PLANT BASED 20 % IV EMUL
240.0000 mL | INTRAVENOUS | Status: AC
  Administered 2017-07-30: 240 mL via INTRAVENOUS
  Filled 2017-07-30: qty 250

## 2017-07-30 MED ORDER — PSYLLIUM 95 % PO PACK
1.0000 | PACK | Freq: Every day | ORAL | Status: DC
  Administered 2017-08-05 – 2017-08-14 (×10): 1 via ORAL
  Filled 2017-07-30 (×10): qty 1

## 2017-07-30 MED ORDER — TRACE MINERALS CR-CU-MN-SE-ZN 10-1000-500-60 MCG/ML IV SOLN
INTRAVENOUS | Status: AC
  Administered 2017-07-30: 17:00:00 via INTRAVENOUS
  Filled 2017-07-30: qty 1440

## 2017-07-30 NOTE — Progress Notes (Signed)
[place] NOTE   Pharmacy Consult for TPN Indication: prolonged NPO status  Patient Measurements: Height: 6\' 2"  (188 cm) Weight: 153 lb 3.5 oz (69.5 kg) IBW/kg (Calculated) : 82.2 TPN AdjBW (KG): 73 Body mass index is 19.67 kg/m. Usual Weight: 82 kg  Insulin Requirements: 6 units SSI last 24hr  Current Nutrition: NPO + ice chips  IVF: LR at 75 ml/hr  Central access: double lumen PAC TPN start date: 07/28/17  ASSESSMENT                                                                                                          HPI: 66 yoM with PMH colorectal cancer s/p chemo/rads and now s/p lower anterior resection with diverting loop ileostomy performed on 4/4. On 4/11 the patient developed post-op ileus and was made NPO. Also treating for potential anastomotic leak seen on CT as small presacral fluid collection. The patient's nutritional status has been poor for quite some time despite being on a diet until recently. Pharmacy consulted to start TPN. Spoke with RD; considering patient high risk for refeeding given prolonged poor PO intake.  Significant events:   Today: (last labs done 4/11)  Glucose - CBGs stable  Electrolytes - WNL except K 3.3 again - will replace  Renal -SCr WNL, stable at baseline  LFTs - WNL  TGs - 142  Prealbumin - 4/15  NUTRITIONAL GOALS                                                                                             RD recs: Kcal:  2000-2200 (29-32 kcal/kg) Protein:  80-100 grams/day (1.2-1.4 grams/kg) Fluid:  2.0-2.2 L/day  Keep K >/= 4.0, Mg >/= 2.0 with ileus  Clinimix E 5/20 at a goal rate of 75 ml/hr + 20% fat emulsion at 20 ml/hr x 12 hr to provide: 90 g/day protein, 2169 Kcal/day.  PLAN                                                                                                                         At 1800 today:  Increase Clinimix E 5/20 to 60  ml/hr given refeeding  risk  20% fat emulsion at 20 ml/hr  Plan to advance as tolerated to the goal rate  TPN to contain standard multivitamins and trace elements  Reduce IVF to 65 ml/hr, consider changing to NS if lytes stable and Surgery has no objections  For K 3.3, repeat 4 runs KCL IV today  Start sensitive SSI with q4 hr CBG checks  BMP, Mg, Phos x 3 days  TPN lab panels on Mondays & Thursdays  F/u daily   Adrian Saran, PharmD, BCPS Pager [phone] 07/30/2017 6:19 AM

## 2017-07-30 NOTE — Progress Notes (Addendum)
[address] [place], [place] [address] Phone: [phone]  FAX: [phone]      Jacob Galvan [id number] May 11, 1975  CARE TEAM:  PCP: Patient, No Pcp Per  Outpatient Care Team: Patient Care Team: Patient, No Pcp Per as PCP - General (General Practice)  Inpatient Treatment Team: Treatment Team: Attending Provider: Leighton Ruff, MD; Registered Nurse: Meryle Ready, RN; Technician: Etheleen Sia, [place]; Consulting Physician: Ladell Pier, MD; Technician: Sueanne Margarita, NT; Technician: Leda Quail, [place]; Registered Nurse: Heloise Ochoa, RN; Student Nurse: Army Melia, Student-RN   Problem List:   Principal Problem:   Ileus, postoperative [facility]) Active Problems:   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  Consider restart PO tomorrow if better -TNA for malnutrition & ileus -Pain control.  sch tylenol/gabapentin.  Reeq heating pad - ordered.  PRN toradol, Robaxin, narcotics -Invanz for pelvic fluid collection.  CT planned later today per Dr Marcello Moores to see if resloved/needs draining -hypoK - correcting -VTE prophylaxis- SCDs, etc -mobilize as tolerated to help recovery  30 minutes spent in review, evaluation, examination, counseling, and coordination of care.  More than 50% of that time was spent in counseling.  Jacob Galvan, M.D., F.A.C.S. Gastrointestinal and Minimally Invasive Surgery [facility] [address] [place], [place]  [address] [phone] Main / Paging   07/30/2017    Subjective: (Chief complaint)  Sore Gassy Trying to walk more Doing small sips  Objective:  Vital signs:  Vitals:   07/29/17 0358 07/29/17 1352 07/29/17 2143 07/30/17 0540  BP: 102/71 120/73 100/66 92/63  Pulse: 79 75 75 67  Resp: _0 Temp: 99.2 F (37.3 C) 98.9 F (37.2 C) 98.9 F (37.2 C) 98.8 F (37.1 C)  TempSrc: Oral Oral Oral Oral  SpO2: 98% 98% 97% 98%  Weight:      Height:        Last BM Date: 07/29/17  Intake/Output   Yesterday:  04/13 0701 - 04/14 0700 In: 1384.9 [I.V.:1229.9; IV Piggyback:155] Out: 2549 [Urine:375; Stool:1101] This shift:  Total I/O In: -  Out: 175 [Stool:175]  Bowel function:  Flatus: YES  BM:  YES  Drain: (No drain)   Physical Exam:  General: Pt awake/alert/oriented x4 in no acute distress.  Thin.  Mildly cachectic Eyes: PERRL, normal EOM.  Sclera clear.  No icterus Neuro: CN II-XII intact w/o focal sensory/motor deficits. Lymph: No head/neck/groin lymphadenopathy Psych:  No delerium/psychosis/paranoia HENT: Normocephalic, Mucus membranes moist.  No thrush Neck: Supple, No tracheal deviation Chest: No chest wall pain w good excursion CV:  Pulses intact.  Regular rhythm MS: Normal AROM mjr joints.  No obvious deformity  Abdomen: Somewhat firm.  Mildy distended.  Mildly tender at incisions only.  No evidence of peritonitis.  No incarcerated hernias.  Ext:  No deformity.  No mjr edema.  No cyanosis Skin: No petechiae / purpura  Results:   Labs: Results for orders placed or performed during the hospital encounter of 07/20/17 (from the past 48  hour(s))  Glucose, capillary     Status: Abnormal   Collection Time: 07/28/17  9:13 PM  Result Value Ref Range   Glucose-Capillary 132 (H) 65 - 99 mg/dL  Glucose, capillary     Status: Abnormal   Collection Time: 07/29/17 12:02 AM  Result Value Ref Range   Glucose-Capillary 129 (H) 65 - 99 mg/dL  Glucose,  capillary     Status: Abnormal   Collection Time: 07/29/17  3:55 AM  Result Value Ref Range   Glucose-Capillary 121 (H) 65 - 99 mg/dL  Comprehensive metabolic panel     Status: Abnormal   Collection Time: 07/29/17  4:04 AM  Result Value Ref Range   Sodium 137 135 - 145 mmol/L   Potassium 3.3 (L) 3.5 - 5.1 mmol/L    Comment: DELTA CHECK NOTED NO VISIBLE HEMOLYSIS    Chloride 89 (L) 101 - 111 mmol/L   CO2 33 (H) 22 - 32 mmol/L   Glucose, Bld 129 (H) 65 - 99 mg/dL   BUN 17 6 - 20 mg/dL   Creatinine, Ser 1.07 0.61 - 1.24 mg/dL   Calcium 8.8 (L) 8.9 - 10.3 mg/dL   Total Protein 7.3 6.5 - 8.1 g/dL   Albumin 3.0 (L) 3.5 - 5.0 g/dL   AST 20 15 - 41 U/L   ALT 44 17 - 63 U/L   Alkaline Phosphatase 81 38 - 126 U/L   Total Bilirubin 0.4 0.3 - 1.2 mg/dL   GFR calc non Af Amer >60 >60 mL/min   GFR calc Af Amer >60 >60 mL/min    Comment: (NOTE) The eGFR has been calculated using the CKD EPI equation. This calculation has not been validated in all clinical situations. eGFR's persistently <60 mL/min signify possible Chronic Kidney Disease.    Anion gap 15 5 - 15    Comment: Performed at [facility], [place] [address] [place], [place] [address]  Magnesium     Status: None   Collection Time: 07/29/17  4:04 AM  Result Value Ref Range   Magnesium 2.2 1.7 - 2.4 mg/dL    Comment: Performed at [facility], [place] [address], [place], [place] [address]  Phosphorus     Status: None   Collection Time: 07/29/17  4:04 AM  Result Value Ref Range   Phosphorus 4.0 2.5 - 4.6 mg/dL    Comment: Performed at [facility], [place] [address], [place], [place] [address]  CBC     Status: Abnormal   Collection Time: 07/29/17  4:04 AM  Result Value Ref Range   WBC 13.4 (H) 4.0 - 10.5 K/uL   RBC 4.61 4.22 - 5.81 MIL/uL   Hemoglobin 14.0 13.0 - 17.0 g/dL   HCT 41.8 39.0 - 52.0 %   MCV 90.7 78.0 - 100.0 fL   MCH 30.4 26.0 - 34.0 pg   MCHC 33.5 30.0 - 36.0  g/dL   RDW 13.0 11.5 - 15.5 %   Platelets 578 (H) 150 - 400 K/uL    Comment: Performed at [facility], [place] [address] [place],  [address]  Glucose, capillary     Status: Abnormal   Collection Time: 07/29/17  7:37 AM  Result Value Ref Range   Glucose-Capillary 125 (H) 65 - 99 mg/dL  Glucose, capillary     Status: Abnormal   Collection Time: 07/29/17 12:03 PM  Result Value Ref Range   Glucose-Capillary 131 (H) 65 - 99 mg/dL  Glucose, capillary  Status: Abnormal   Collection Time: 07/29/17  4:59 PM  Result Value Ref Range   Glucose-Capillary 137 (H) 65 - 99 mg/dL  Glucose, capillary     Status: Abnormal   Collection Time: 07/29/17  7:59 PM  Result Value Ref Range   Glucose-Capillary 149 (H) 65 - 99 mg/dL  Glucose, capillary     Status: Abnormal   Collection Time: 07/29/17 11:43 PM  Result Value Ref Range   Glucose-Capillary 141 (H) 65 - 99 mg/dL  Glucose, capillary     Status: Abnormal   Collection Time: 07/30/17  3:42 AM  Result Value Ref Range   Glucose-Capillary 147 (H) 65 - 99 mg/dL  Phosphorus     Status: None   Collection Time: 07/30/17  4:59 AM  Result Value Ref Range   Phosphorus 3.9 2.5 - 4.6 mg/dL    Comment: Performed at [facility], [place] [address] [place], [place] [address]  Magnesium     Status: None   Collection Time: 07/30/17  4:59 AM  Result Value Ref Range   Magnesium 2.0 1.7 - 2.4 mg/dL    Comment: Performed at [facility], [place] [address] [place], [place] [address]  Comprehensive metabolic panel     Status: Abnormal   Collection Time: 07/30/17  4:59 AM  Result Value Ref Range   Sodium 136 135 - 145 mmol/L   Potassium 3.3 (L) 3.5 - 5.1 mmol/L   Chloride 92 (L) 101 - 111 mmol/L   CO2 32 22 - 32 mmol/L   Glucose, Bld 144 (H) 65 - 99 mg/dL   BUN 14 6 - 20 mg/dL   Creatinine, Ser 0.85 0.61 - 1.24 mg/dL   Calcium 8.5 (L) 8.9 - 10.3 mg/dL   Total Protein 6.9 6.5 - 8.1 g/dL   Albumin 2.8  (L) 3.5 - 5.0 g/dL   AST 14 (L) 15 - 41 U/L   ALT 27 17 - 63 U/L   Alkaline Phosphatase 75 38 - 126 U/L   Total Bilirubin 0.3 0.3 - 1.2 mg/dL   GFR calc non Af Amer >60 >60 mL/min   GFR calc Af Amer >60 >60 mL/min    Comment: (NOTE) The eGFR has been calculated using the CKD EPI equation. This calculation has not been validated in all clinical situations. eGFR's persistently <60 mL/min signify possible Chronic Kidney Disease.    Anion gap 12 5 - 15    Comment: Performed at [facility], [place] [address], [place], [place] [address]  Triglycerides     Status: None   Collection Time: 07/30/17  4:59 AM  Result Value Ref Range   Triglycerides 142 <150 mg/dL    Comment: Performed at [facility], [place] [address], [place], [place] [address]  Glucose, capillary     Status: Abnormal   Collection Time: 07/30/17  7:38 AM  Result Value Ref Range   Glucose-Capillary 117 (H) 65 - 99 mg/dL    Imaging / Studies: Dg Chest Port 1 View  Result Date: 07/28/2017 CLINICAL DATA:  Confirm position of the Port-A-Cath tip. EXAM: PORTABLE CHEST 1 VIEW COMPARISON:  Chest radiograph from 03/04/2017. FINDINGS: There is a dual lumen right chest Port-A-Cath which is CT injectable. The catheter tip is at the superior cavoatrial junction. No evidence for catheter discontinuity. Both lungs are clear. Heart and mediastinum are within normal limits. Trachea is midline. Negative for pneumothorax. 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 IMPRESSION: Catheter tip in the region of the superior cavoatrial junction. Stable position based on previous examination from 2018. Electronically Signed   By: Markus Daft M.D.   On: 07/28/2017 13:00    Medications / Allergies: per chart  Antibiotics: Anti-infectives (From admission, onward)   Start     Dose/Rate Route Frequency Ordered Stop   07/27/17 1800   ertapenem (INVANZ) 1 g in sodium chloride 0.9 % 100 mL IVPB     1 g 200 mL/hr over 30 Minutes Intravenous Every 24 hours 07/27/17 1632     07/20/17 2000  cefoTEtan (CEFOTAN) 2 g in sodium chloride 0.9 % 100 mL IVPB  Status:  Discontinued     2 g 200 mL/hr over 30 Minutes Intravenous Every 12 hours 07/20/17 1346 07/20/17 1415   07/20/17 2000  cefoTEtan in Dextrose 5% (CEFOTAN) IVPB 2 g     2 g Intravenous Every 12 hours 07/20/17 1415 07/20/17 1930   07/20/17 0540  cefoTEtan in Dextrose 5% (CEFOTAN) IVPB 2 g     2 g Intravenous On call to O.R. 07/20/17 0540 07/20/17 0817        Note: Portions of this report may have been transcribed using voice recognition software. Every effort was made to ensure accuracy; however, inadvertent computerized transcription errors may be present.   Any transcriptional errors that result from this process are unintentional.     Jacob Galvan, M.D., F.A.C.S. Gastrointestinal and Minimally Invasive Surgery [facility] [address] [place], [place] [address] [phone] Main / Paging   07/30/2017

## 2017-07-30 NOTE — Progress Notes (Signed)
Patient ID: Jacob Galvan., male   DOB: Dec 13, 1975, 42 y.o.   MRN: [id number] [facility] Progress Note:   10 Days Post-Op  Subjective: Mental status is clear;  He is having hiccoughs and poor appetite.  I discussed that he may need repeat CT and drainage if there is a collection evolving in his pelvis.   Objective: Vital signs in last 24 hours: Temp:  [98.8 F (37.1 C)-98.9 F (37.2 C)] 98.8 F (37.1 C) (04/14 0540) Pulse Rate:  [67-75] 67 (04/14 0540) Resp:  [13-16] 14 (04/14 0540) BP: (92-120)/(63-73) 92/63 (04/14 0540) SpO2:  [97 %-98 %] 98 % (04/14 0540)  Intake/Output from previous day: 04/13 0701 - 04/14 0700 In: 1384.9 [I.V.:1229.9; IV Piggyback:155] Out: 6712 [Urine:375; Stool:1101] Intake/Output this shift: Total I/O In: -  Out: 175 [Stool:175]  Physical Exam: Work of breathing is not labored.  On TNA;  Lower abdominal discomfort  Lab Results:  Results for orders placed or performed during the hospital encounter of 07/20/17 (from the past 48 hour(s))  Glucose, capillary     Status: Abnormal   Collection Time: 07/28/17  9:13 PM  Result Value Ref Range   Glucose-Capillary 132 (H) 65 - 99 mg/dL  Glucose, capillary     Status: Abnormal   Collection Time: 07/29/17 12:02 AM  Result Value Ref Range   Glucose-Capillary 129 (H) 65 - 99 mg/dL  Glucose, capillary     Status: Abnormal   Collection Time: 07/29/17  3:55 AM  Result Value Ref Range   Glucose-Capillary 121 (H) 65 - 99 mg/dL  Comprehensive metabolic panel     Status: Abnormal   Collection Time: 07/29/17  4:04 AM  Result Value Ref Range   Sodium 137 135 - 145 mmol/L   Potassium 3.3 (L) 3.5 - 5.1 mmol/L    Comment: DELTA CHECK NOTED NO VISIBLE HEMOLYSIS    Chloride 89 (L) 101 - 111 mmol/L   CO2 33 (H) 22 - 32 mmol/L   Glucose, Bld 129 (H) 65 - 99 mg/dL   BUN 17 6 - 20 mg/dL   Creatinine, Ser 1.07 0.61 - 1.24 mg/dL   Calcium 8.8 (L) 8.9 - 10.3 mg/dL   Total Protein 7.3 6.5 - 8.1 g/dL   Albumin  3.0 (L) 3.5 - 5.0 g/dL   AST 20 15 - 41 U/L   ALT 44 17 - 63 U/L   Alkaline Phosphatase 81 38 - 126 U/L   Total Bilirubin 0.4 0.3 - 1.2 mg/dL   GFR calc non Af Amer >60 >60 mL/min   GFR calc Af Amer >60 >60 mL/min    Comment: (NOTE) The eGFR has been calculated using the CKD EPI equation. This calculation has not been validated in all clinical situations. eGFR's persistently <60 mL/min signify possible Chronic Kidney Disease.    Anion gap 15 5 - 15    Comment: Performed at [facility], [place] [address], [place], [place] [address]  Magnesium     Status: None   Collection Time: 07/29/17  4:04 AM  Result Value Ref Range   Magnesium 2.2 1.7 - 2.4 mg/dL    Comment: Performed at [facility], [place] [address] [place], [place] [address]  Phosphorus     Status: None   Collection Time: 07/29/17  4:04 AM  Result Value Ref Range   Phosphorus 4.0 2.5 - 4.6 mg/dL    Comment: Performed at [facility], [place] [address], [place], [place] [address]  CBC     Status: Abnormal   Collection Time: 07/29/17  4:04 AM  Result Value Ref Range   WBC 13.4 (H) 4.0 - 10.5 K/uL   RBC 4.61 4.22 - 5.81 MIL/uL   Hemoglobin 14.0 13.0 - 17.0 g/dL   HCT 41.8 39.0 - 52.0 %   MCV 90.7 78.0 - 100.0 fL   MCH 30.4 26.0 - 34.0 pg   MCHC 33.5 30.0 - 36.0 g/dL   RDW 13.0 11.5 - 15.5 %   Platelets 578 (H) 150 - 400 K/uL    Comment: Performed at [facility], [place] [address] [place], [place] [address]  Glucose, capillary     Status: Abnormal   Collection Time: 07/29/17  7:37 AM  Result Value Ref Range   Glucose-Capillary 125 (H) 65 - 99 mg/dL  Glucose, capillary     Status: Abnormal   Collection Time: 07/29/17 12:03 PM  Result Value Ref Range   Glucose-Capillary 131 (H) 65 - 99 mg/dL  Glucose, capillary     Status: Abnormal   Collection Time: 07/29/17  4:59 PM  Result Value Ref Range   Glucose-Capillary 137 (H) 65 - 99 mg/dL  Glucose,  capillary     Status: Abnormal   Collection Time: 07/29/17  7:59 PM  Result Value Ref Range   Glucose-Capillary 149 (H) 65 - 99 mg/dL  Glucose, capillary     Status: Abnormal   Collection Time: 07/29/17 11:43 PM  Result Value Ref Range   Glucose-Capillary 141 (H) 65 - 99 mg/dL  Glucose, capillary     Status: Abnormal   Collection Time: 07/30/17  3:42 AM  Result Value Ref Range   Glucose-Capillary 147 (H) 65 - 99 mg/dL  Phosphorus     Status: None   Collection Time: 07/30/17  4:59 AM  Result Value Ref Range   Phosphorus 3.9 2.5 - 4.6 mg/dL    Comment: Performed at [facility], [place] [address] [place], [place] [address]  Magnesium     Status: None   Collection Time: 07/30/17  4:59 AM  Result Value Ref Range   Magnesium 2.0 1.7 - 2.4 mg/dL    Comment: Performed at [place] [facility], [place] [address], [place], [place] [address]  Comprehensive metabolic panel     Status: Abnormal   Collection Time: 07/30/17  4:59 AM  Result Value Ref Range   Sodium 136 135 - 145 mmol/L   Potassium 3.3 (L) 3.5 - 5.1 mmol/L   Chloride 92 (L) 101 - 111 mmol/L   CO2 32 22 - 32 mmol/L   Glucose, Bld 144 (H) 65 - 99 mg/dL   BUN 14 6 - 20 mg/dL   Creatinine, Ser 0.85 0.61 - 1.24 mg/dL   Calcium 8.5 (L) 8.9 - 10.3 mg/dL   Total Protein 6.9 6.5 - 8.1 g/dL   Albumin 2.8 (L) 3.5 - 5.0 g/dL   AST 14 (L) 15 - 41 U/L   ALT 27 17 - 63 U/L   Alkaline Phosphatase 75 38 - 126 U/L   Total Bilirubin 0.3 0.3 - 1.2 mg/dL   GFR calc non Af Amer >60 >60 mL/min   GFR calc Af Amer >60 >60 mL/min    Comment: (NOTE) The eGFR has been calculated using the CKD EPI equation. This calculation has not been validated in all clinical situations. eGFR's persistently <60 mL/min signify possible Chronic Kidney Disease.    Anion gap 12 5 - 15    Comment:  Performed at [facility], [place] [address] [place], [place] [address]  Triglycerides     Status: None   Collection Time:  07/30/17  4:59 AM  Result Value Ref Range   Triglycerides 142 <150 mg/dL    Comment: Performed at [facility], [place] [address], [place], [place] [address]  Glucose, capillary     Status: Abnormal   Collection Time: 07/30/17  7:38 AM  Result Value Ref Range   Glucose-Capillary 117 (H) 65 - 99 mg/dL    Radiology/Results: Dg Chest Port 1 View  Result Date: 07/28/2017 CLINICAL DATA:  Confirm position of the Port-A-Cath tip. EXAM: PORTABLE CHEST 1 VIEW COMPARISON:  Chest radiograph from 03/04/2017. FINDINGS: There is a dual lumen right chest Port-A-Cath which is CT injectable. The catheter tip is at the superior cavoatrial junction. No evidence for catheter discontinuity. Both lungs are clear. Heart and mediastinum are within normal limits. Trachea is midline. Negative for pneumothorax. 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 IMPRESSION: Catheter tip in the region of the superior cavoatrial junction. Stable position based on previous examination from 2018. Electronically Signed   By: Markus Daft M.D.   On: 07/28/2017 13:00    Anti-infectives: Anti-infectives (From admission, onward)   Start     Dose/Rate Route Frequency Ordered Stop   07/27/17 1800  ertapenem (INVANZ) 1 g in sodium chloride 0.9 % 100 mL IVPB     1 g 200 mL/hr over 30 Minutes Intravenous Every 24 hours 07/27/17 1632     07/20/17 2000  cefoTEtan (CEFOTAN) 2 g in sodium chloride 0.9 % 100 mL IVPB  Status:  Discontinued     2 g 200 mL/hr over 30 Minutes Intravenous Every 12 hours 07/20/17 1346 07/20/17 1415   07/20/17 2000  cefoTEtan in Dextrose 5% (CEFOTAN) IVPB 2 g     2 g Intravenous Every 12 hours 07/20/17 1415 07/20/17 1930   07/20/17 0540  cefoTEtan in Dextrose 5% (CEFOTAN) IVPB 2 g     2 g Intravenous On call to O.R. 07/20/17 0540 07/20/17 0817      Assessment/Plan: Problem List: Patient Active Problem List    Diagnosis Date Noted  . Protein-calorie malnutrition, severe 07/29/2017  . Genetic testing 04/17/2017  . Port-A-Cath in place 04/17/2017  . Family history of colon cancer 03/29/2017  . Family history of melanoma 03/29/2017  . Family history of pancreatic cancer 03/29/2017  . Family history of testicular cancer 03/29/2017  . Rectal cancer ([place]) 03/17/2017   Will repeat CT scan and look for drainable collection.   10 Days Post-Op    LOS: 10 days   Matt B. Hassell Done, MD, [facility] [phone] beeper [phone]  07/30/2017 8:12 AM

## 2017-07-31 ENCOUNTER — Inpatient Hospital Stay (HOSPITAL_COMMUNITY): Payer: Medicaid Other

## 2017-07-31 LAB — COMPREHENSIVE METABOLIC PANEL
ALT: 28 U/L (ref 17–63)
ANION GAP: 12 (ref 5–15)
AST: 22 U/L (ref 15–41)
Albumin: 2.7 g/dL — ABNORMAL LOW (ref 3.5–5.0)
Alkaline Phosphatase: 98 U/L (ref 38–126)
BUN: 14 mg/dL (ref 6–20)
CALCIUM: 8.6 mg/dL — AB (ref 8.9–10.3)
CHLORIDE: 93 mmol/L — AB (ref 101–111)
CO2: 30 mmol/L (ref 22–32)
CREATININE: 0.88 mg/dL (ref 0.61–1.24)
GFR calc non Af Amer: >60 mL/min (ref >60)
Glucose, Bld: 146 mg/dL — ABNORMAL HIGH (ref 65–99)
Potassium: 3.6 mmol/L (ref 3.5–5.1)
SODIUM: 135 mmol/L (ref 135–145)
Total Bilirubin: 0.5 mg/dL (ref 0.3–1.2)
Total Protein: 7 g/dL (ref 6.5–8.1)

## 2017-07-31 LAB — GLUCOSE, CAPILLARY
GLUCOSE-CAPILLARY: 136 mg/dL — AB (ref 65–99)
GLUCOSE-CAPILLARY: 117 mg/dL — AB (ref 65–99)
Glucose-Capillary: 153 mg/dL — ABNORMAL HIGH (ref 65–99)
Glucose-Capillary: 134 mg/dL — ABNORMAL HIGH (ref 65–99)
Glucose-Capillary: 148 mg/dL — ABNORMAL HIGH (ref 65–99)

## 2017-07-31 LAB — DIFFERENTIAL
BASOS ABS: 0 10*3/uL (ref 0.0–0.1)
BASOS PCT: 0 %
EOS ABS: 0.2 10*3/uL (ref 0.0–0.7)
Eosinophils Relative: 1 %
Lymphocytes Relative: 6 %
Lymphs Abs: 1 10*3/uL (ref 0.7–4.0)
MONOS PCT: 8 %
Monocytes Absolute: 1.2 10*3/uL — ABNORMAL HIGH (ref 0.1–1.0)
NEUTROS PCT: 85 %
Neutro Abs: 13 10*3/uL — ABNORMAL HIGH (ref 1.7–7.7)

## 2017-07-31 LAB — CBC
HEMATOCRIT: 37.4 % — AB (ref 39.0–52.0)
Hemoglobin: 12.8 g/dL — ABNORMAL LOW (ref 13.0–17.0)
MCH: 30.8 pg (ref 26.0–34.0)
MCHC: 34.2 g/dL (ref 30.0–36.0)
MCV: 89.9 fL (ref 78.0–100.0)
Platelets: 557 10*3/uL — ABNORMAL HIGH (ref 150–400)
RBC: 4.16 MIL/uL — ABNORMAL LOW (ref 4.22–5.81)
RDW: 13.1 % (ref 11.5–15.5)
WBC: 15.4 10*3/uL — AB (ref 4.0–10.5)

## 2017-07-31 LAB — PHOSPHORUS: PHOSPHORUS: 3.8 mg/dL (ref 2.5–4.6)

## 2017-07-31 LAB — MAGNESIUM: Magnesium: 1.9 mg/dL (ref 1.7–2.4)

## 2017-07-31 LAB — PREALBUMIN: PREALBUMIN: 10.7 mg/dL — AB (ref 18–38)

## 2017-07-31 LAB — PROTIME-INR
INR: 1.03
Prothrombin Time: 13.4 seconds (ref 11.4–15.2)

## 2017-07-31 LAB — TRIGLYCERIDES: TRIGLYCERIDES: 129 mg/dL (ref <150)

## 2017-07-31 MED ORDER — HYDROMORPHONE 1 MG/ML IV SOLN
INTRAVENOUS | Status: DC
  Administered 2017-07-31: 1.8 mg via INTRAVENOUS
  Administered 2017-07-31: 10:00:00 via INTRAVENOUS
  Administered 2017-07-31: 7.4 mg via INTRAVENOUS
  Administered 2017-08-01: 5.1 mg via INTRAVENOUS
  Administered 2017-08-01: 2.7 mg via INTRAVENOUS
  Administered 2017-08-01: 4.8 mg via INTRAVENOUS
  Administered 2017-08-01: 16:00:00 via INTRAVENOUS
  Administered 2017-08-01: 4.5 mg via INTRAVENOUS
  Administered 2017-08-01: 3.3 mg via INTRAVENOUS
  Administered 2017-08-01: 2.7 mg via INTRAVENOUS
  Administered 2017-08-02: 6 mg via INTRAVENOUS
  Administered 2017-08-02: 3.9 mg via INTRAVENOUS
  Administered 2017-08-02: 23:00:00 via INTRAVENOUS
  Administered 2017-08-02: 3.9 mg via INTRAVENOUS
  Administered 2017-08-02: 2.1 mg via INTRAVENOUS
  Administered 2017-08-03: 1.2 mg via INTRAVENOUS
  Administered 2017-08-03: 0.9 mg via INTRAVENOUS
  Administered 2017-08-03: 3 mg via INTRAVENOUS
  Administered 2017-08-03: 1.8 mg via INTRAVENOUS
  Administered 2017-08-03: 5.4 mg via INTRAVENOUS
  Administered 2017-08-04: 2.7 mg via INTRAVENOUS
  Administered 2017-08-04: 0.6 mg via INTRAVENOUS
  Administered 2017-08-04: 2.7 mg via INTRAVENOUS
  Administered 2017-08-04: 0.3 mg via INTRAVENOUS
  Administered 2017-08-04: 21:00:00 via INTRAVENOUS
  Administered 2017-08-04: 1.5 mg via INTRAVENOUS
  Administered 2017-08-04: 1.8 mg via INTRAVENOUS
  Administered 2017-08-05: 0.3 mg via INTRAVENOUS
  Administered 2017-08-05: 1.5 mg via INTRAVENOUS
  Administered 2017-08-05: 1.2 mg via INTRAVENOUS
  Administered 2017-08-05: 1.8 mg via INTRAVENOUS
  Administered 2017-08-05: 0.9 mg via INTRAVENOUS
  Administered 2017-08-05: 1.2 mg via INTRAVENOUS
  Administered 2017-08-05 – 2017-08-06 (×2): 0.9 mg via INTRAVENOUS
  Administered 2017-08-06: 3 mg via INTRAVENOUS
  Administered 2017-08-06 (×2): 0.9 mg via INTRAVENOUS
  Administered 2017-08-06: 1.2 mg via INTRAVENOUS
  Administered 2017-08-06: 0 mg via INTRAVENOUS
  Administered 2017-08-07: 0.3 mg via INTRAVENOUS
  Administered 2017-08-07: 0.9 mg via INTRAVENOUS
  Administered 2017-08-07: 0.6 mg via INTRAVENOUS
  Administered 2017-08-07: 1.2 mg via INTRAVENOUS
  Administered 2017-08-07: 0.3 mg via INTRAVENOUS
  Administered 2017-08-08: 0.6 mg via INTRAVENOUS
  Administered 2017-08-08: 0.9 mg via INTRAVENOUS
  Administered 2017-08-08 (×2): 0.3 mg via INTRAVENOUS
  Administered 2017-08-08: 0.6 mg via INTRAVENOUS
  Administered 2017-08-09 (×3): 0.3 mg via INTRAVENOUS
  Administered 2017-08-09 (×2): 0.6 mg via INTRAVENOUS
  Administered 2017-08-10: 0.3 mg via INTRAVENOUS
  Administered 2017-08-10 (×2): 0.9 mg via INTRAVENOUS
  Administered 2017-08-10: 0 mg via INTRAVENOUS
  Administered 2017-08-10: 0.3 mg via INTRAVENOUS
  Administered 2017-08-10 (×2): 0.6 mg via INTRAVENOUS
  Administered 2017-08-10: 10:00:00 via INTRAVENOUS
  Administered 2017-08-11 (×3): 0.3 mg via INTRAVENOUS
  Administered 2017-08-11: 0.6 mg via INTRAVENOUS
  Administered 2017-08-11: 0.3 mg via INTRAVENOUS
  Administered 2017-08-12 (×2): 0 mg via INTRAVENOUS
  Administered 2017-08-12 – 2017-08-13 (×3): 0.6 mg via INTRAVENOUS
  Administered 2017-08-13: 0 mg via INTRAVENOUS
  Administered 2017-08-13: 1.8 mg via INTRAVENOUS
  Administered 2017-08-13: 0.3 mg via INTRAVENOUS
  Administered 2017-08-14: 0.6 mg via INTRAVENOUS
  Administered 2017-08-14: 0 mg via INTRAVENOUS
  Administered 2017-08-14: 0.6 mg via INTRAVENOUS
  Administered 2017-08-14 (×2): 0.3 mg via INTRAVENOUS
  Administered 2017-08-14: 1.2 mg via INTRAVENOUS
  Administered 2017-08-15: 0.6 mg via INTRAVENOUS
  Administered 2017-08-15: 0.3 mg via INTRAVENOUS
  Administered 2017-08-15: 0 mg via INTRAVENOUS
  Filled 2017-07-31 (×6): qty 25

## 2017-07-31 MED ORDER — MIDAZOLAM HCL 2 MG/2ML IJ SOLN
INTRAMUSCULAR | Status: AC | PRN
  Administered 2017-07-31 (×2): 1 mg via INTRAVENOUS

## 2017-07-31 MED ORDER — FAT EMULSION PLANT BASED 20 % IV EMUL
240.0000 mL | INTRAVENOUS | Status: AC
  Administered 2017-07-31: 240 mL via INTRAVENOUS
  Filled 2017-07-31: qty 250

## 2017-07-31 MED ORDER — LACTATED RINGERS IV SOLN
INTRAVENOUS | Status: DC
  Administered 2017-08-01: 1000 mL via INTRAVENOUS

## 2017-07-31 MED ORDER — PIPERACILLIN-TAZOBACTAM 3.375 G IVPB
3.3750 g | Freq: Three times a day (TID) | INTRAVENOUS | Status: DC
  Administered 2017-07-31 – 2017-08-03 (×9): 3.375 g via INTRAVENOUS
  Filled 2017-07-31 (×10): qty 50

## 2017-07-31 MED ORDER — LIDOCAINE HCL 1 % IJ SOLN
INTRAMUSCULAR | Status: AC | PRN
  Administered 2017-07-31: 20 mL

## 2017-07-31 MED ORDER — FENTANYL CITRATE (PF) 100 MCG/2ML IJ SOLN
INTRAMUSCULAR | Status: AC | PRN
  Administered 2017-07-31 (×2): 50 ug via INTRAVENOUS

## 2017-07-31 MED ORDER — NALOXONE HCL 0.4 MG/ML IJ SOLN: 0.4000 mg | INTRAMUSCULAR | Status: DC | PRN

## 2017-07-31 MED ORDER — M.V.I. ADULT IV INJ
INJECTION | INTRAVENOUS | Status: AC
  Administered 2017-07-31: 18:00:00 via INTRAVENOUS
  Filled 2017-07-31: qty 1800

## 2017-07-31 MED ORDER — FENTANYL CITRATE (PF) 100 MCG/2ML IJ SOLN
INTRAMUSCULAR | Status: AC
  Filled 2017-07-31: qty 6

## 2017-07-31 MED ORDER — SODIUM CHLORIDE 0.9% FLUSH: 9.0000 mL | INTRAVENOUS | Status: DC | PRN

## 2017-07-31 MED ORDER — LIDOCAINE HCL 1 % IJ SOLN
INTRAMUSCULAR | Status: AC | PRN
  Administered 2017-07-31: 10 mL via INTRADERMAL

## 2017-07-31 MED ORDER — TRACE MINERALS CR-CU-MN-SE-ZN 10-1000-500-60 MCG/ML IV SOLN: INTRAVENOUS | Status: DC

## 2017-07-31 MED ORDER — FAT EMULSION PLANT BASED 20 % IV EMUL: 240.0000 mL | INTRAVENOUS | Status: DC

## 2017-07-31 MED ORDER — MIDAZOLAM HCL 2 MG/2ML IJ SOLN
INTRAMUSCULAR | Status: AC
  Filled 2017-07-31: qty 6

## 2017-07-31 MED ORDER — ENOXAPARIN SODIUM 40 MG/0.4ML ~~LOC~~ SOLN
40.0000 mg | SUBCUTANEOUS | Status: DC
  Administered 2017-08-01 – 2017-08-29 (×29): 40 mg via SUBCUTANEOUS
  Filled 2017-07-31 (×27): qty 0.4

## 2017-07-31 NOTE — Procedures (Signed)
  Procedure: CT aspiration of pelvic collection scant serosanguinous fluid, sent for GS, C&S EBL:   minimal Complications:  none immediate  See full dictation in [facility].  Dillard Cannon MD Main # [phone] Pager  [phone]

## 2017-07-31 NOTE — Progress Notes (Signed)
Nutrition Follow-up  DOCUMENTATION CODES:   Severe malnutrition in context of chronic illness  INTERVENTION:  Monitor magnesium, potassium, and phosphorus daily for at least 3 days, MD to replete as needed, as pt is at risk for refeeding syndrome given severe protein-calorie malnutrition and minimal PO intake x 1 week.  TPN per Pharmacy  NUTRITION DIAGNOSIS:   Severe Malnutrition related to chronic illness, cancer and cancer related treatments as evidenced by moderate fat depletion, severe fat depletion, moderate muscle depletion, severe muscle depletion.  Ongoing.  GOAL:   Patient will meet greater than or equal to 90% of their needs  Meeting with TPN.  MONITOR:   Labs, I & O's, Skin, Weight trends, Diet advancement ,TPN  ASSESSMENT:   42 year old male with PMH significant for stage II colorectal cancer s/p chemo and radiation. 07/20/17 - s/p robotic assisted lower anterior resection with splenic flexure mobilization and diverting loop ileostomy 07/27/17 - CT shows ileus with small presacral fluid collection 07/28/17 - NPO, TPN  Pt continues to be NPO. Pt to have CT guided drainage of pelvic fluid today.  Per Pharmacy note, TPN will be advanced to goal rate today: Clinimix E 5/20 @ 75 ml/hr + 20% fat emulsion at 20 ml/hr x 12 hr (2169 kcal and 90g protein).  Medications: Metamucil packet daily, Lactated Ringers infusion @ 65 ml/hr, IV Zofran PRN Labs reviewed: CBGs: 134-154 Mg/Phos WNL  Diet Order:  Diet NPO time specified Except for: Ice Chips, Sips with Meds .TPN (CLINIMIX-E) Adult TPN (CLINIMIX-E) Adult  EDUCATION NEEDS:   No education needs have been identified at this time  Skin:  Skin Assessment: Skin Integrity Issues: Skin Integrity Issues:: Incisions Incisions: surgical incision to abdomen  Last BM:  07/28/17 large type 7 via ileostomy  Height:   Ht Readings from Last 1 Encounters:  07/20/17 6\' 2"  (1.88 m)    Weight:   Wt Readings from Last 1  Encounters:  07/24/17 153 lb 3.5 oz (69.5 kg)    Ideal Body Weight:  86.4 kg  BMI:  Body mass index is 19.67 kg/m.  Estimated Nutritional Needs:   Kcal:  2000-2200 (29-32 kcal/kg)  Protein:  80-100 grams/day (1.2-1.4 grams/kg)  Fluid:  2.0-2.2 L/day  Clayton Bibles, MS, RD, LDN [place] Dietitian Pager: [phone] After Hours Pager: [phone]

## 2017-07-31 NOTE — Progress Notes (Signed)
MEDICATION-RELATED CONSULT NOTE   IR Procedure Consult - Anticoagulant/Antiplatelet PTA/Inpatient Med List Review by Pharmacist    Procedure: CT aspiration of pelvis    Completed: 07/31/17 @ 17:17  Post-Procedural bleeding risk per IR MD assessment:  STANDARD  Antithrombotic medications on inpatient or PTA profile prior to procedure:   Lovenox 40mg  sq q24h (last charted dose 4/14 @ 0822)    Recommended restart time per IR Post-Procedure Guidelines:  Day + 1 (the next AM)   Other considerations:      Plan:     Resume Lovenox 40mg  sq q24h (08/01/17 AM)  Leone Haven, PharmD 07/31/17 @ 17:42

## 2017-07-31 NOTE — Progress Notes (Signed)
[address] NOTE   Pharmacy Consult for TPN Indication: prolonged NPO status  Patient Measurements: Height: 6\' 2"  (188 cm) Weight: 153 lb 3.5 oz (69.5 kg) IBW/kg (Calculated) : 82.2 TPN AdjBW (KG): 73 Body mass index is 19.67 kg/m. Usual Weight: 82 kg  Insulin Requirements: 7 units SSI last 24hr  Current Nutrition: NPO + ice chips  IVF: LR at 65 ml/hr  Central access: double lumen PAC TPN start date: 07/28/17  ASSESSMENT                                                                                                          HPI: 30 yoM with PMH colorectal cancer s/p chemo/rads and now s/p lower anterior resection with diverting loop ileostomy performed on 4/4. On 4/11 the patient developed post-op ileus and was made NPO. Also treating for potential anastomotic leak seen on CT as small presacral fluid collection. The patient's nutritional status has been poor for quite some time despite being on a diet until recently. Pharmacy consulted to start TPN. Spoke with RD; considering patient high risk for refeeding given prolonged poor PO intake.  Significant events:   Today: (last labs done 4/11)  Glucose - CBGs stable  Electrolytes - WNL  K 3.6 post replacement  Renal -SCr WNL, stable at baseline  LFTs - WNL  TGs - 129  Prealbumin - in process  NUTRITIONAL GOALS                                                                                             RD recs: Kcal:  2000-2200 (29-32 kcal/kg) Protein:  80-100 grams/day (1.2-1.4 grams/kg) Fluid:  2.0-2.2 L/day  Keep K >/= 4.0, Mg >/= 2.0 with ileus  Clinimix E 5/20 at a goal rate of 75 ml/hr + 20% fat emulsion at 20 ml/hr x 12 hr to provide: 90 g/day protein, 2169 Kcal/day.  PLAN                                                                                                                         At 1800 today:  Increase Clinimix E 5/20 to 75 ml/hr (  goal)  20% fat emulsion at  20 ml/hr  TPN to contain standard multivitamins and trace elements  Reduce IVF to 50 ml/hr, consider changing to NS if lytes stable and Surgery has no objections  continue sensitive SSI with q4 hr CBG checks  BMP, Mg, Phos x 3 days  TPN lab panels on Mondays & Thursdays  F/u daily   Dolly Rias RPh 07/31/2017, 10:16 AM Pager [phone]

## 2017-07-31 NOTE — Progress Notes (Signed)
11 Days Post-Op Robotic LAR with diverting loop ileostomy Subjective: abd pain about the same.   Objective: Vital signs in last 24 hours: Temp:  [98.2 F (36.8 C)-99.1 F (37.3 C)] 99.1 F (37.3 C) (04/15 0406) Pulse Rate:  [79-90] 90 (04/15 0406) Resp:  [18] 18 (04/15 0406) BP: (98-108)/(62-70) 108/68 (04/15 0406) SpO2:  [96 %-99 %] 96 % (04/15 0406)   Intake/Output from previous day: 04/14 0701 - 04/15 0700 In: 1627.3 [I.V.:1527.3; IV Piggyback:100] Out: 801 [Urine:325; Stool:476] Intake/Output this shift: No intake/output data recorded.   General appearance: alert and cooperative GI: normal findings: soft, mildly distended, appropriately tender  Incision: no significant drainage  Lab Results:  Recent Labs    07/29/17 0404 07/31/17 0425  WBC 13.4* 15.4*  HGB 14.0 12.8*  HCT 41.8 37.4*  PLT 578* 557*   BMET Recent Labs    07/30/17 0459 07/31/17 0425  NA 136 135  K 3.3* 3.6  CL 92* 93*  CO2 32 30  GLUCOSE 144* 146*  BUN 14 14  CREATININE 0.85 0.88  CALCIUM 8.5* 8.6*   PT/INR No results for input(s): LABPROT, INR in the last 72 hours. ABG No results for input(s): PHART, HCO3 in the last 72 hours.  Invalid input(s): PCO2, PO2  MEDS, Scheduled . acetaminophen  1,000 mg Oral TID  . enoxaparin (LOVENOX) injection  40 mg Subcutaneous Q24H  . gabapentin  300 mg Oral QHS  . HYDROmorphone   Intravenous Q4H  . insulin aspart  0-9 Units Subcutaneous Q4H  . lip balm  1 application Topical BID  . oxymetazoline  1 spray Each Nare BID  . psyllium  1 packet Oral Daily  . simethicone  80 mg Oral TID    Studies/Results: Ct Abdomen Pelvis W Contrast  Result Date: 07/30/2017 CLINICAL DATA:  Abdominal distention. Recent low anterior resection and diverting colostomy 10 days ago. EXAM: CT ABDOMEN AND PELVIS WITH CONTRAST TECHNIQUE: Multidetector CT imaging of the abdomen and pelvis was performed using the standard protocol following bolus administration of  intravenous contrast. CONTRAST:  179mL ISOVUE-300 IOPAMIDOL (ISOVUE-300) INJECTION 61%, 12mL ISOVUE-300 IOPAMIDOL (ISOVUE-300) INJECTION 61% COMPARISON:  CT abdomen pelvis dated July 27, 2017. FINDINGS: Lower chest: No acute abnormality. Hepatobiliary: Unchanged 1.6 cm metastasis in the anterior left liver. No new liver lesions. The gallbladder is unremarkable. No biliary dilatation. Pancreas: Unremarkable. No pancreatic ductal dilatation or surrounding inflammatory changes. Spleen: Normal in size without focal abnormality. Adrenals/Urinary Tract: Adrenal glands are unremarkable. Kidneys are normal, without renal calculi, focal lesion, or hydronephrosis. Bladder is under distended. Stomach/Bowel: Postsurgical changes related to recent low anterior resection and right lower quadrant loop colostomy. Unchanged severe dilatation of small bowel with multiple air-fluid levels. Transition point is again seen in the central pelvis. The colon is mostly decompressed. Vascular/Lymphatic: No significant vascular findings. Multiple prominent subcentimeter left para-aortic retroperitoneal lymph nodes measuring up to 10 mm in short axis are unchanged. Reproductive: Prostate is unremarkable. Other: Slight interval increase in size of presacral fluid collection, now measuring 2.6 x 5.3 cm, previously 2.7 x 4.7 cm. No pneumoperitoneum. Musculoskeletal: No acute or significant osseous findings. IMPRESSION: 1. Slight interval increase in size of the presacral fluid collection, now measuring 2.6 x 5.3 cm, previously 2.7 x 4.7 cm. 2. Unchanged severe small bowel obstruction with transition point in the central pelvis, likely secondary to an adhesion. 3. Unchanged indeterminate left para-aortic retroperitoneal lymphadenopathy. Attention on follow-up imaging is recommended. 4. Unchanged small 1.6 cm metastasis in the left hepatic lobe.  Electronically Signed   By: Titus Dubin M.D.   On: 07/30/2017 12:04    Assessment: s/p  Procedure(s): XI ROBOTIC ASSISTED LOWER ANTERIOR RESECTION ERAS PATHWAY DIVERTING LOOP COLOSTOMY Patient Active Problem List   Diagnosis Date Noted  . Ileostomy in place [facility]) 07/30/2017  . Ileus, postoperative ([place]) 07/30/2017  . Hypokalemia 07/30/2017  . Protein-calorie malnutrition, severe 07/29/2017  . Genetic testing 04/17/2017  . Port-A-Cath in place 04/17/2017  . Family history of colon cancer 03/29/2017  . Family history of melanoma 03/29/2017  . Family history of pancreatic cancer 03/29/2017  . Family history of testicular cancer 03/29/2017  . Rectal adenocarcinoma [id number] s/p LAR/ileostomy 07/20/2017 12/07/2016    Post op ileus.  CT shows ileus with presacral fluid collection. His wbc continues to trend up despite antibiotic treatment  Plan: NPO, sips of clears Cont MIV.   Cont TPN TOradol and Robaxin PRN. Will try dilaudid pca Ambulate Change to zosyn for pelvic fluid collection.   Will consult IR for drain placement    LOS: 11 days     .Rosario Adie, [facility], [place]   07/31/2017 8:38 AM

## 2017-07-31 NOTE — Progress Notes (Addendum)
Referring Physician(s): Colburn  Supervising Physician: Arne Cleveland  Patient Status:  [facility] - In-pt  Chief Complaint:  Abdominal pain  Subjective: Pt familiar to IR service from prior liver lesion biopsy on 05/30/17.  He has a history of recently diagnosed colon carcinoma and is status post LAR with diverting loop ileostomy on 07/20/17 as well as chemoradiation.  He continues to have pelvic pain along with increasing white blood cell count despite antibiotic therapy.  CT abdomen pelvis performed yesterday revealed slight interval increase in presacral fluid collection.  Also noted was unchanged severe small bowel obstruction, unchanged left periaortic retroperitoneal lymphadenopathy as well as unchanged small 1.6 cm metastasis in the left hepatic lobe.  Request now received from surgery for CT-guided aspiration/ possible drainage of the presacral fluid collection.  Patient currently denies fever, chest pain, dyspnea, cough, or abnormal bleeding.  He does complain of occasional headaches, abdominal/pelvic/back pain, occasional nausea /vomiting. Past Medical History:  Diagnosis Date  . Headache   . Rectal cancer ([place]) 2018   radiation and Chemo   Past Surgical History:  Procedure Laterality Date  . COLONOSCOPY    . LAPAROSCOPIC DIVERTED COLOSTOMY N/A 07/20/2017   Procedure: DIVERTING LOOP COLOSTOMY;  Surgeon: Leighton Ruff, MD;  Location: [facility] ORS;  Service: General;  Laterality: N/A;  . PORTA CATH INSERTION    . WISDOM TOOTH EXTRACTION    . XI ROBOTIC ASSISTED LOWER ANTERIOR RESECTION N/A 07/20/2017   Procedure: XI ROBOTIC ASSISTED LOWER ANTERIOR RESECTION ERAS PATHWAY;  Surgeon: Leighton Ruff, MD;  Location: [facility] ORS;  Service: General;  Laterality: N/A;      Allergies: Patient has no known allergies.  Medications: Prior to Admission medications   Medication Sig Start Date End Date Taking? Authorizing Provider  ibuprofen (ADVIL,MOTRIN) 200 MG tablet Take 400 mg by mouth as  needed.   Yes [provider]  oxymetazoline (AFRIN) 0.05 % nasal spray Place 1 spray into both nostrils 2 (two) times daily.   Yes [provider]  silver sulfADIAZINE (SILVADENE) 1 % cream Apply 1 application topically daily.   Yes [provider]  oxyCODONE (OXY IR/ROXICODONE) 5 MG immediate release tablet Take 1 tablet (5 mg total) by mouth every 4 (four) hours as needed for severe pain. Patient not taking: Reported on 07/18/2017 06/12/17   Owens Shark, NP     Vital Signs: BP 108/68 (BP Location: Left Arm)   Pulse 90   Temp 99.1 F (37.3 C) (Oral)   Resp 18   Ht 6\' 2"  (1.88 m)   Wt 153 lb 3.5 oz (69.5 kg)   SpO2 96%   BMI 19.67 kg/m   Physical Exam awake, alert.  Chest clear to auscultation bilaterally.  Clean, intact right chest wall Port-A-Cath.  Heart with regular rate and rhythm.  Abdomen slightly distended, few BS, ostomy intact, tender lower abd/pelvic region to palpation; no LE edema Imaging: Ct Abdomen Pelvis W Contrast  Result Date: 07/30/2017 CLINICAL DATA:  Abdominal distention. Recent low anterior resection and diverting colostomy 10 days ago. EXAM: CT ABDOMEN AND PELVIS WITH CONTRAST TECHNIQUE: Multidetector CT imaging of the abdomen and pelvis was performed using the standard protocol following bolus administration of intravenous contrast. CONTRAST:  149mL ISOVUE-300 IOPAMIDOL (ISOVUE-300) INJECTION 61%, 66mL ISOVUE-300 IOPAMIDOL (ISOVUE-300) INJECTION 61% COMPARISON:  CT abdomen pelvis dated July 27, 2017. FINDINGS: Lower chest: No acute abnormality. Hepatobiliary: Unchanged 1.6 cm metastasis in the anterior left liver. No new liver lesions. The gallbladder is unremarkable. No biliary dilatation.  Pancreas: Unremarkable. No pancreatic ductal dilatation or surrounding inflammatory changes. Spleen: Normal in size without focal abnormality. Adrenals/Urinary Tract: Adrenal glands are unremarkable. Kidneys are normal, without renal calculi, focal  lesion, or hydronephrosis. Bladder is under distended. Stomach/Bowel: Postsurgical changes related to recent low anterior resection and right lower quadrant loop colostomy. Unchanged severe dilatation of small bowel with multiple air-fluid levels. Transition point is again seen in the central pelvis. The colon is mostly decompressed. Vascular/Lymphatic: No significant vascular findings. Multiple prominent subcentimeter left para-aortic retroperitoneal lymph nodes measuring up to 10 mm in short axis are unchanged. Reproductive: Prostate is unremarkable. Other: Slight interval increase in size of presacral fluid collection, now measuring 2.6 x 5.3 cm, previously 2.7 x 4.7 cm. No pneumoperitoneum. Musculoskeletal: No acute or significant osseous findings. IMPRESSION: 1. Slight interval increase in size of the presacral fluid collection, now measuring 2.6 x 5.3 cm, previously 2.7 x 4.7 cm. 2. Unchanged severe small bowel obstruction with transition point in the central pelvis, likely secondary to an adhesion. 3. Unchanged indeterminate left para-aortic retroperitoneal lymphadenopathy. Attention on follow-up imaging is recommended. 4. Unchanged small 1.6 cm metastasis in the left hepatic lobe. Electronically Signed   By: Titus Dubin M.D.   On: 07/30/2017 12:04   Ct Abdomen Pelvis W Contrast  Result Date: 07/27/2017 CLINICAL DATA:  Abdominal distention and nausea with vomiting. 7 days postop from low anterior resection with diverting loop ileostomy. EXAM: CT ABDOMEN AND PELVIS WITH CONTRAST TECHNIQUE: Multidetector CT imaging of the abdomen and pelvis was performed using the standard protocol following bolus administration of intravenous contrast. CONTRAST:  141mL OMNIPAQUE IOHEXOL 300 MG/ML  SOLN COMPARISON:  MRI on 06/28/2017 and 06/26/2017 FINDINGS: Lower Chest: No acute findings. Hepatobiliary: Peripherally enhancing lesion in the anterior left hepatic lobe measures 1.6 x 1.3 cm, without significant change  since previous study. This is consistent with a liver metastasis. A tiny sub-cm cyst in the anterior segment of the right lobe is unchanged. No new or enlarging liver lesions identified. Gallbladder is unremarkable. Pancreas:  No mass or inflammatory changes. Spleen: Within normal limits in size and appearance. Adrenals/Urinary Tract: No masses identified. No evidence of hydronephrosis. Small amount of gas in urinary bladder is likely due to recent catheterization. Stomach/Bowel: Expected postop changes from low anterior resection and right lower quadrant ileostomy. A small fluid collection is seen in the presacral space measuring approximately 2.7 x 4.7 cm on image 79/2. No other abnormal fluid collections or free intraperitoneal air. Severe dilatation of small bowel is seen with multiple air-fluid levels. There appears to be a transition point to nondilated small bowel loops in the central pelvis, suspicious for adhesion. Vascular/Lymphatic: Mild retroperitoneal lymphadenopathy is seen in the left paraaortic region which is new since previous study. Largest index lymph node measures 10 mm on image 35/2. No other sites of lymphadenopathy identified. Reproductive:  No mass or other significant abnormality. Other:  None. Musculoskeletal:  No suspicious bone lesions identified. IMPRESSION: Recent postop changes from low anterior resection and right lower quadrant ileostomy. Small presacral fluid collection measuring 2.7 x 4.7 cm. Marked small bowel dilatation, with transition point in central pelvis, suspicious for adhesion. New mild retroperitoneal lymphadenopathy in left paraaortic region, which is indeterminate. This is likely reactive in etiology, although metastatic disease cannot definitely be excluded. Recommend continued attention on follow-up imaging. Stable small left hepatic lobe metastasis. Electronically Signed   By: Earle Gell M.D.   On: 07/27/2017 16:08   Dg Chest [facility] 1 View  Result  Date:  07/28/2017 CLINICAL DATA:  Confirm position of the Port-A-Cath tip. EXAM: PORTABLE CHEST 1 VIEW COMPARISON:  Chest radiograph from 03/04/2017. FINDINGS: There is a dual lumen right chest Port-A-Cath which is CT injectable. The catheter tip is at the superior cavoatrial junction. No evidence for catheter discontinuity. Both lungs are clear. Heart and mediastinum are within normal limits. Trachea is midline. Negative for pneumothorax. 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 IMPRESSION: Catheter tip in the region of the superior cavoatrial junction. Stable position based on previous examination from 2018. Electronically Signed   By: Markus Daft M.D.   On: 07/28/2017 13:00   [place] Ekg Site Rite  Result Date: 07/28/2017 If Site Rite image not attached, placement could not be confirmed due to current cardiac rhythm.   Labs:  CBC: Recent Labs    07/27/17 0927 07/28/17 0442 07/29/17 0404 07/31/17 0425  WBC 14.1* 12.6* 13.4* 15.4*  HGB 14.3 14.0 14.0 12.8*  HCT 42.0 41.9 41.8 37.4*  PLT 442* 458* 578* 557*    COAGS: Recent Labs    05/30/17 1136  INR 0.95    BMP: Recent Labs    07/27/17 0927 07/29/17 0404 07/30/17 0459 07/31/17 0425  NA 138 137 136 135  K 4.0 3.3* 3.3* 3.6  CL 95* 89* 92* 93*  CO2 27 33* 32 30  GLUCOSE 110* 129* 144* 146*  BUN 13 17 14 14   CALCIUM 9.2 8.8* 8.5* 8.6*  CREATININE 0.94 1.07 0.85 0.88  GFRNONAA >60 >60 >60 >60  GFRAA >60 >60 >60 >60    LIVER FUNCTION TESTS: Recent Labs    05/30/17 1136 07/29/17 0404 07/30/17 0459 07/31/17 0425  BILITOT 0.8 0.4 0.3 0.5  AST 21 20 14* 22  ALT 17 44 27 28  ALKPHOS 49 81 75 98  PROT 6.6 7.3 6.9 7.0  ALBUMIN 3.8 3.0* 2.8* 2.7*    Assessment and Plan: Pt with history of recently diagnosed colon carcinoma , status post LAR with diverting loop ileostomy on 07/20/17 as well as chemoradiation.  He continues to have pelvic pain  along with increasing white blood cell count despite antibiotic therapy.  CT abdomen pelvis performed yesterday revealed slight interval increase in presacral fluid collection.  Also noted was unchanged severe small bowel obstruction, unchanged left periaortic retroperitoneal lymphadenopathy as well as unchanged small 1.6 cm metastasis in the left hepatic lobe.  Request now received from surgery for CT-guided aspiration/ possible drainage of the presacral fluid collection.  Imaging studies have been reviewed by Dr. Vernard Gambles.Risks and benefits discussed with the patient/mother including bleeding, infection, damage to adjacent structures, bowel perforation/fistula connection, and sepsis.  All of the patient's questions were answered, patient is agreeable to proceed. Consent signed and in chart.  Procedure tent  planned for later today.  Need to hold Lovenox until after above procedure   Electronically Signed: D. Rowe Robert, PA-C 07/31/2017, 9:18 AM   I spent a total of 25 minutes at the the patient's bedside AND on the patient's hospital floor or unit, greater than 50% of which was counseling/coordinating care for CT-guided aspiration/possible drainage of presacral fluid collection    Patient ID: [name]., male   DOB: Aug 29, 1975, 42 y.o.   MRN: [id number]

## 2017-07-31 NOTE — Consult Note (Signed)
[place] Nurse ostomy follow up Stoma type/location: RUQ, ileostomy  Stomal assessment/size: 1 1/2" round, budded, pink and moist Peristomal assessment: NA Treatment options for stomal/peristomal skin: using 2" barrier ring to aid in seal with high output ileostomy  Output liquid green Ostomy pouching: 1pc convex .  Education provided: discussed step of pouch change with patient, he reports he has changed a couple of times independently. He is emptying independently. Had nursing assist with pouch change last evening because he said "pouch was dirty" explained rationale for changing 2-3 x per week.  Enrolled patient in [place] Start Discharge program: Yes Patient is indigent, will provide him with information to obtain supplies through the indigent program.  Supplies in the patient room  [place] nurse to meet patient and his mother in his room in the morning for pouch change. Patient is headed to IR for drain placement. [place], [place], [place]

## 2017-08-01 ENCOUNTER — Inpatient Hospital Stay (HOSPITAL_COMMUNITY): Payer: Medicaid Other

## 2017-08-01 LAB — BASIC METABOLIC PANEL
Anion gap: 12 (ref 5–15)
BUN: 17 mg/dL (ref 6–20)
CALCIUM: 8.3 mg/dL — AB (ref 8.9–10.3)
CO2: 28 mmol/L (ref 22–32)
CREATININE: 0.91 mg/dL (ref 0.61–1.24)
Chloride: 95 mmol/L — ABNORMAL LOW (ref 101–111)
GFR calc non Af Amer: >60 mL/min (ref >60)
GLUCOSE: 128 mg/dL — AB (ref 65–99)
Potassium: 3.8 mmol/L (ref 3.5–5.1)
Sodium: 135 mmol/L (ref 135–145)

## 2017-08-01 LAB — CBC
HEMATOCRIT: 37.2 % — AB (ref 39.0–52.0)
Hemoglobin: 12.5 g/dL — ABNORMAL LOW (ref 13.0–17.0)
MCH: 30.3 pg (ref 26.0–34.0)
MCHC: 33.6 g/dL (ref 30.0–36.0)
MCV: 90.1 fL (ref 78.0–100.0)
Platelets: 554 10*3/uL — ABNORMAL HIGH (ref 150–400)
RBC: 4.13 MIL/uL — ABNORMAL LOW (ref 4.22–5.81)
RDW: 13.2 % (ref 11.5–15.5)
WBC: 14.1 10*3/uL — ABNORMAL HIGH (ref 4.0–10.5)

## 2017-08-01 LAB — GLUCOSE, CAPILLARY
GLUCOSE-CAPILLARY: 118 mg/dL — AB (ref 65–99)
GLUCOSE-CAPILLARY: 123 mg/dL — AB (ref 65–99)
GLUCOSE-CAPILLARY: 130 mg/dL — AB (ref 65–99)
GLUCOSE-CAPILLARY: 137 mg/dL — AB (ref 65–99)
Glucose-Capillary: 141 mg/dL — ABNORMAL HIGH (ref 65–99)
Glucose-Capillary: 156 mg/dL — ABNORMAL HIGH (ref 65–99)

## 2017-08-01 LAB — PHOSPHORUS: PHOSPHORUS: 4.6 mg/dL (ref 2.5–4.6)

## 2017-08-01 LAB — MAGNESIUM: Magnesium: 2 mg/dL (ref 1.7–2.4)

## 2017-08-01 MED ORDER — LIDOCAINE HCL 2 % EX GEL
1.0000 | Freq: Once | CUTANEOUS | Status: AC
Start: 2017-08-01 — End: 2017-08-01
  Administered 2017-08-01: 1
  Filled 2017-08-01: qty 5

## 2017-08-01 MED ORDER — ALTEPLASE 2 MG IJ SOLR
2.0000 mg | Freq: Once | INTRAMUSCULAR | Status: AC
  Administered 2017-08-01: 2 mg
  Filled 2017-08-01 (×2): qty 2

## 2017-08-01 MED ORDER — FAT EMULSION PLANT BASED 20 % IV EMUL
240.0000 mL | INTRAVENOUS | Status: AC
  Administered 2017-08-01: 240 mL via INTRAVENOUS
  Filled 2017-08-01: qty 250

## 2017-08-01 MED ORDER — POTASSIUM CHLORIDE 10 MEQ/100ML IV SOLN
10.0000 meq | INTRAVENOUS | Status: AC
  Administered 2017-08-01 (×2): 10 meq via INTRAVENOUS
  Filled 2017-08-01 (×2): qty 100

## 2017-08-01 MED ORDER — TRACE MINERALS CR-CU-MN-SE-ZN 10-1000-500-60 MCG/ML IV SOLN
INTRAVENOUS | Status: AC
  Administered 2017-08-01: 22:00:00 via INTRAVENOUS
  Filled 2017-08-01: qty 1800

## 2017-08-01 NOTE — Progress Notes (Signed)
12 Days Post-Op Robotic LAR with diverting loop ileostomy Subjective: abd pain about the same.   Objective: Vital signs in last 24 hours: Temp:  [98.4 F (36.9 C)-99.2 F (37.3 C)] 99 F (37.2 C) (04/16 2992) Pulse Rate:  [73-92] 78 (04/16 0638) Resp:  [11-22] 16 (04/16 4268) BP: (99-108)/(67-81) 108/81 (04/16 3419) SpO2:  [97 %-100 %] 100 % (04/16 6222)   Intake/Output from previous day: 04/15 0701 - 04/16 0700 In: 4609.9 [P.O.:210; I.V.:4249.9; IV Piggyback:150] Out: 2650 [Urine:1225; LNLGX:2119] Intake/Output this shift: Total I/O In: 0  Out: 100 [Urine:100]   General appearance: alert and cooperative GI: normal findings: soft, mildly distended, appropriately tender  Incision: no significant drainage  Lab Results:  Recent Labs    07/31/17 0425 08/01/17 0627  WBC 15.4* 14.1*  HGB 12.8* 12.5*  HCT 37.4* 37.2*  PLT 557* 554*   BMET Recent Labs    07/31/17 0425 08/01/17 0627  NA 135 135  K 3.6 3.8  CL 93* 95*  CO2 30 28  GLUCOSE 146* 128*  BUN 14 17  CREATININE 0.88 0.91  CALCIUM 8.6* 8.3*   PT/INR Recent Labs    07/31/17 1044  LABPROT 13.4  INR 1.03   ABG No results for input(s): PHART, HCO3 in the last 72 hours.  Invalid input(s): PCO2, PO2  MEDS, Scheduled . acetaminophen  1,000 mg Oral TID  . alteplase  2 mg Intracatheter Once  . enoxaparin (LOVENOX) injection  40 mg Subcutaneous Q24H  . gabapentin  300 mg Oral QHS  . HYDROmorphone   Intravenous Q4H  . insulin aspart  0-9 Units Subcutaneous Q4H  . lip balm  1 application Topical BID  . oxymetazoline  1 spray Each Nare BID  . psyllium  1 packet Oral Daily  . simethicone  80 mg Oral TID    Studies/Results: Ct Abdomen Pelvis W Contrast  Result Date: 07/30/2017 CLINICAL DATA:  Abdominal distention. Recent low anterior resection and diverting colostomy 10 days ago. EXAM: CT ABDOMEN AND PELVIS WITH CONTRAST TECHNIQUE: Multidetector CT imaging of the abdomen and pelvis was performed using  the standard protocol following bolus administration of intravenous contrast. CONTRAST:  177mL ISOVUE-300 IOPAMIDOL (ISOVUE-300) INJECTION 61%, 52mL ISOVUE-300 IOPAMIDOL (ISOVUE-300) INJECTION 61% COMPARISON:  CT abdomen pelvis dated July 27, 2017. FINDINGS: Lower chest: No acute abnormality. Hepatobiliary: Unchanged 1.6 cm metastasis in the anterior left liver. No new liver lesions. The gallbladder is unremarkable. No biliary dilatation. Pancreas: Unremarkable. No pancreatic ductal dilatation or surrounding inflammatory changes. Spleen: Normal in size without focal abnormality. Adrenals/Urinary Tract: Adrenal glands are unremarkable. Kidneys are normal, without renal calculi, focal lesion, or hydronephrosis. Bladder is under distended. Stomach/Bowel: Postsurgical changes related to recent low anterior resection and right lower quadrant loop colostomy. Unchanged severe dilatation of small bowel with multiple air-fluid levels. Transition point is again seen in the central pelvis. The colon is mostly decompressed. Vascular/Lymphatic: No significant vascular findings. Multiple prominent subcentimeter left para-aortic retroperitoneal lymph nodes measuring up to 10 mm in short axis are unchanged. Reproductive: Prostate is unremarkable. Other: Slight interval increase in size of presacral fluid collection, now measuring 2.6 x 5.3 cm, previously 2.7 x 4.7 cm. No pneumoperitoneum. Musculoskeletal: No acute or significant osseous findings. IMPRESSION: 1. Slight interval increase in size of the presacral fluid collection, now measuring 2.6 x 5.3 cm, previously 2.7 x 4.7 cm. 2. Unchanged severe small bowel obstruction with transition point in the central pelvis, likely secondary to an adhesion. 3. Unchanged indeterminate left para-aortic retroperitoneal lymphadenopathy.  Attention on follow-up imaging is recommended. 4. Unchanged small 1.6 cm metastasis in the left hepatic lobe. Electronically Signed   By: Titus Dubin M.D.    On: 07/30/2017 12:04    Assessment: s/p Procedure(s): XI ROBOTIC ASSISTED LOWER ANTERIOR RESECTION ERAS PATHWAY DIVERTING LOOP OSTOMY Patient Active Problem List   Diagnosis Date Noted  . Ileostomy in place [facility]) 07/30/2017  . Ileus, postoperative ([place]) 07/30/2017  . Hypokalemia 07/30/2017  . Protein-calorie malnutrition, severe 07/29/2017  . Genetic testing 04/17/2017  . Port-A-Cath in place 04/17/2017  . Family history of colon cancer 03/29/2017  . Family history of melanoma 03/29/2017  . Family history of pancreatic cancer 03/29/2017  . Family history of testicular cancer 03/29/2017  . Rectal adenocarcinoma [id number] s/p LAR/ileostomy 07/20/2017 12/07/2016    Post op ileus.  CT shows ileus with presacral fluid collection. His wbc continues to trend up despite antibiotic treatment.    CT aspiration shows seroma with no signs of leak.  Cx's sent   Plan: NPO, sips of clears Cont MIV.   Cont TPN TOradol and Robaxin PRN. Cont dilaudid pca Ambulate Cont zosyn until cx results return, since wbc trending down.         LOS: 12 days     .Rosario Adie, [facility], [place]   08/01/2017 8:08 AM

## 2017-08-01 NOTE — Progress Notes (Signed)
Spoke with RN and made her aware that the patient was reaccessed and still no blood return from port one. Port two blood flow was present. RN made aware that TNA had been stopped and would be restarted once TPA declotting was performed. RN was asked to order TPA medication from the pharmacy. Patient was educated on signs and symptoms of low blood sugar and educated to call for the nurse if signs and symptoms presented. RN was also made aware of the possibility of hypoglycemia. Patient has a PIV that pain medication was moved to by the RN so the patient would still be comfortable during th process. RN was asked if she would call and notify the Doctor of changes at this time.

## 2017-08-01 NOTE — Progress Notes (Signed)
[place] NOTE   Pharmacy Consult for TPN Indication: prolonged NPO status  Patient Measurements: Height: 6\' 2"  (188 cm) Weight: 153 lb 3.5 oz (69.5 kg) IBW/kg (Calculated) : 82.2 TPN AdjBW (KG): 73 Body mass index is 19.67 kg/m. Usual Weight: 82 kg  Insulin Requirements: 4 units SSI last 24hr  Current Nutrition: NPO + ice chips  IVF: LR at 65 ml/hr  Central access: double lumen PAC TPN start date: 07/28/17  ASSESSMENT                                                                                                          HPI: 89 yoM with PMH colorectal cancer s/p chemo/rads and now s/p lower anterior resection with diverting loop ileostomy performed on 4/4. On 4/11 the patient developed post-op ileus and was made NPO. Also treating for potential anastomotic leak seen on CT as small presacral fluid collection. The patient's nutritional status has been poor for quite some time despite being on a diet until recently. Pharmacy consulted to start TPN. Spoke with RD; considering patient high risk for refeeding given prolonged poor PO intake.  Significant events:   Today:   Glucose - CBGs stable   Electrolytes - WNL  K=3.8 goal >/= 4.0 with ileus  Renal -SCr WNL, stable at baseline  LFTs - WNL  TGs - 129 (4/15)  Prealbumin - 10.7 (4/15)  NUTRITIONAL GOALS                                                                                             RD recs: Kcal:  2000-2200 (29-32 kcal/kg) Protein:  80-100 grams/day (1.2-1.4 grams/kg) Fluid:  2.0-2.2 L/day  Keep K >/= 4.0, Mg >/= 2.0 with ileus  Clinimix E 5/20 at a goal rate of 75 ml/hr + 20% fat emulsion at 20 ml/hr x 12 hr to provide: 90 g/day protein, 2169 Kcal/day.  PLAN      KCl 27meq IV x 2                                                                                                                     At 1800 today:  continue Clinimix E 5/20 at 75 ml/hr (goal)  20%  fat emulsion at 20 ml/hr  TPN to contain standard multivitamins and trace elements  continue IVF at 50 ml/hr  continue sensitive SSI with q4 hr CBG checks  BMet with mag and phos in am  TPN lab panels on Mondays & Thursdays  F/u daily   Dolly Rias RPh 08/01/2017, 10:33 AM Pager [phone]

## 2017-08-01 NOTE — Consult Note (Signed)
[place] Nurse ostomy follow up Stoma type/location: RUQ, ileostomy Stomal assessment/size: 1 1/2"  Peristomal assessment: intact  Treatment options for stomal/peristomal skin: using 2" barrier ring to aid in seal with high output ileostomy Output liquid/green, clear when emptied with WOC this am Ostomy pouching: 1pc. Convex with barrier ring Education provided:  Explained stoma characteristics (budded, flush, color, texture, care) Patient demonstrated pouch change to [facility] nurse with no cuing (cutting new skin barrier, measuring stoma, cleaning peristomal skin and stoma, use of barrier ring) Demonstrated use of wick to clean spout  Discussed bathing, diet, gas, medication use, constipation Discussed risk of peristomal hernia Dicussed risk of dehydration with high output  Enrolled patient in [employer] Discharge program: Yes Provided patient with [employer] assistance program information. Suggested he contact them as soon as possible to start process for supplies.    [place] Nurse will follow along with you for continued support with ostomy teaching and care [place] MSN, RN, [place], [place], [place]

## 2017-08-02 LAB — BASIC METABOLIC PANEL
Anion gap: 13 (ref 5–15)
BUN: 13 mg/dL (ref 6–20)
CALCIUM: 8.5 mg/dL — AB (ref 8.9–10.3)
CO2: 28 mmol/L (ref 22–32)
CREATININE: 0.95 mg/dL (ref 0.61–1.24)
Chloride: 93 mmol/L — ABNORMAL LOW (ref 101–111)
GFR calc Af Amer: >60 mL/min (ref >60)
GFR calc non Af Amer: >60 mL/min (ref >60)
GLUCOSE: 139 mg/dL — AB (ref 65–99)
Potassium: 4 mmol/L (ref 3.5–5.1)
Sodium: 134 mmol/L — ABNORMAL LOW (ref 135–145)

## 2017-08-02 LAB — CBC
HEMATOCRIT: 35.7 % — AB (ref 39.0–52.0)
Hemoglobin: 11.9 g/dL — ABNORMAL LOW (ref 13.0–17.0)
MCH: 30.1 pg (ref 26.0–34.0)
MCHC: 33.3 g/dL (ref 30.0–36.0)
MCV: 90.2 fL (ref 78.0–100.0)
Platelets: 585 10*3/uL — ABNORMAL HIGH (ref 150–400)
RBC: 3.96 MIL/uL — ABNORMAL LOW (ref 4.22–5.81)
RDW: 13.3 % (ref 11.5–15.5)
WBC: 12.9 10*3/uL — ABNORMAL HIGH (ref 4.0–10.5)

## 2017-08-02 LAB — MAGNESIUM: Magnesium: 2.1 mg/dL (ref 1.7–2.4)

## 2017-08-02 LAB — GLUCOSE, CAPILLARY
GLUCOSE-CAPILLARY: 124 mg/dL — AB (ref 65–99)
GLUCOSE-CAPILLARY: 130 mg/dL — AB (ref 65–99)
GLUCOSE-CAPILLARY: 124 mg/dL — AB (ref 65–99)
Glucose-Capillary: 138 mg/dL — ABNORMAL HIGH (ref 65–99)
Glucose-Capillary: 176 mg/dL — ABNORMAL HIGH (ref 65–99)

## 2017-08-02 LAB — PHOSPHORUS: Phosphorus: 4 mg/dL (ref 2.5–4.6)

## 2017-08-02 MED ORDER — INSULIN ASPART 100 UNIT/ML ~~LOC~~ SOLN
0.0000 [IU] | Freq: Three times a day (TID) | SUBCUTANEOUS | Status: DC
  Administered 2017-08-02: 1 [IU] via SUBCUTANEOUS
  Administered 2017-08-03 (×2): 2 [IU] via SUBCUTANEOUS
  Administered 2017-08-03 – 2017-08-05 (×6): 1 [IU] via SUBCUTANEOUS
  Administered 2017-08-05: 2 [IU] via SUBCUTANEOUS
  Administered 2017-08-06 (×2): 1 [IU] via SUBCUTANEOUS
  Administered 2017-08-06 – 2017-08-07 (×3): 2 [IU] via SUBCUTANEOUS
  Administered 2017-08-07: 1 [IU] via SUBCUTANEOUS
  Administered 2017-08-07 – 2017-08-08 (×3): 2 [IU] via SUBCUTANEOUS
  Administered 2017-08-09: 1 [IU] via SUBCUTANEOUS
  Administered 2017-08-09: 2 [IU] via SUBCUTANEOUS

## 2017-08-02 MED ORDER — KCL IN DEXTROSE-NACL 20-5-0.45 MEQ/L-%-% IV SOLN
INTRAVENOUS | Status: DC
  Administered 2017-08-02 – 2017-08-04 (×2): via INTRAVENOUS
  Filled 2017-08-02 (×4): qty 1000

## 2017-08-02 MED ORDER — PHENOL 1.4 % MT LIQD
1.0000 | OROMUCOSAL | Status: DC | PRN
  Administered 2017-08-02: 1 via OROMUCOSAL
  Filled 2017-08-02: qty 177

## 2017-08-02 MED ORDER — M.V.I. ADULT IV INJ
INJECTION | INTRAVENOUS | Status: AC
  Administered 2017-08-02: 18:00:00 via INTRAVENOUS
  Filled 2017-08-02: qty 1800

## 2017-08-02 MED ORDER — FAT EMULSION PLANT BASED 20 % IV EMUL
240.0000 mL | INTRAVENOUS | Status: AC
  Administered 2017-08-02: 240 mL via INTRAVENOUS
  Filled 2017-08-02: qty 250

## 2017-08-02 NOTE — Progress Notes (Signed)
PHARMACY - ADULT TOTAL PARENTERAL NUTRITION CONSULT NOTE   Pharmacy Consult for TPN Indication: prolonged NPO status, ileus  Patient Measurements: Height: 6\' 2"  (188 cm) Weight: 157 lb 13.6 oz (71.6 kg) IBW/kg (Calculated) : 82.2 TPN AdjBW (KG): 73 Body mass index is 20.27 kg/m. Usual Weight: 82 kg  Insulin Requirements: 6 units SSI last 24hr  Current Nutrition: NPO   IVF: LR at 50 ml/hr  Central access: double lumen PAC TPN start date: 07/28/17  ASSESSMENT                                                                                                          HPI: 74 yoM with PMH colorectal cancer s/p chemo/rads and now s/p lower anterior resection with diverting loop ileostomy performed on 4/4. On 4/11 the patient developed post-op ileus and was made NPO. Also treating for potential anastomotic leak seen on CT as small presacral fluid collection. The patient's nutritional status has been poor for quite some time despite being on a diet until recently. Pharmacy consulted to start TPN. Spoke with RD; considering patient high risk for refeeding given prolonged poor PO intake.  Significant events:  - 4/15: aspiration of pelvic collection - 4/16: IV port clotted. TPN infusion d/ced at 1730 and resumed back at ~2200  Today:   Glucose (goal <150): all readings w/in goal range except for one (176) CBGs stable   Electrolytes: Na slightly low at 134: CorrCa, K, phos, Mag  wnl - goal:  K > 4.0, Mg > 2.0 with ileus  Renal: scr wnl  LFTs:  WNL  TGs:  129 (4/15)  Prealbumin: 10.7 (4/15)  NUTRITIONAL GOALS                                                                                             RD recs: Kcal:  2000-2200 (29-32 kcal/kg) Protein:  80-100 grams/day (1.2-1.4 grams/kg) Fluid:  2.0-2.2 L/day  Clinimix E 5/20 at a goal rate of 75 ml/hr + 20% fat emulsion at 20 ml/hr x 12 hr to provide: 90 g/day protein, 2169 Kcal/day.  PLAN                                                                 At 1800 today:  continue Clinimix E 5/20 at 75 ml/hr (goal)  20% fat emulsion at 20 ml/hr  TPN to contain standard multivitamins and trace elements  continue IVF at 50 ml/hr  change sensitive  SSI to q8h  TPN lab panels on Mondays & Thursdays  F/u daily   Dia Sitter, PharmD, BCPS 08/02/2017 8:13 AM

## 2017-08-02 NOTE — Progress Notes (Signed)
13 Days Post-Op Robotic LAR with diverting loop ileostomy Subjective: abd pain better.  Feeling a bit hungry.  NG functioning  Objective: Vital signs in last 24 hours: Temp:  [98.1 F (36.7 C)-100 F (37.8 C)] 98.1 F (36.7 C) (04/17 0612) Pulse Rate:  [76-91] 76 (04/17 0221) Resp:  [13-20] 13 (04/17 0811) BP: (97-107)/(63-69) 97/68 (04/17 0612) SpO2:  [97 %-100 %] 100 % (04/17 0811) Weight:  [71.6 kg (157 lb 13.6 oz)] 71.6 kg (157 lb 13.6 oz) (04/17 0618)   Intake/Output from previous day: 04/16 0701 - 04/17 0700 In: 1549 [P.O.:60; I.V.:1389; IV Piggyback:100] Out: 3850 [Urine:1300; Emesis/NG output:1600; Stool:950] Intake/Output this shift: Total I/O In: -  Out: 250 [Emesis/NG output:250]   General appearance: alert and cooperative GI: normal findings: soft, mildly distended, minimally tender  Incision: no significant drainage  Lab Results:  Recent Labs    08/01/17 0627 08/02/17 0429  WBC 14.1* 12.9*  HGB 12.5* 11.9*  HCT 37.2* 35.7*  PLT 554* 585*   BMET Recent Labs    08/01/17 0627 08/02/17 0429  NA 135 134*  K 3.8 4.0  CL 95* 93*  CO2 28 28  GLUCOSE 128* 139*  BUN 17 13  CREATININE 0.91 0.95  CALCIUM 8.3* 8.5*   PT/INR Recent Labs    07/31/17 1044  LABPROT 13.4  INR 1.03   ABG No results for input(s): PHART, HCO3 in the last 72 hours.  Invalid input(s): PCO2, PO2  MEDS, Scheduled . acetaminophen  1,000 mg Oral TID  . enoxaparin (LOVENOX) injection  40 mg Subcutaneous Q24H  . gabapentin  300 mg Oral QHS  . HYDROmorphone   Intravenous Q4H  . insulin aspart  0-9 Units Subcutaneous Q4H  . lip balm  1 application Topical BID  . oxymetazoline  1 spray Each Nare BID  . psyllium  1 packet Oral Daily  . simethicone  80 mg Oral TID    Studies/Results: Dg Abd 1 View  Result Date: 08/01/2017 CLINICAL DATA:  42 year old male with a history of nasogastric tube placement EXAM: ABDOMEN - 1 VIEW COMPARISON:  CT 07/30/2017 FINDINGS: Persistently  dilated small bowel loops throughout the abdomen. Gastric tube terminates within the stomach. Ostomy apparatus on the right lower quadrant. IMPRESSION: Gastric tube terminates within the stomach. Persistently dilated small bowel loops compatible with obstruction or ileus. Ostomy apparatus on the right lower quadrant. Electronically Signed   By: Corrie Mckusick D.O.   On: 08/01/2017 13:12   Ct Image Guided Drainage By Percutaneous Catheter  Result Date: 08/01/2017 CLINICAL DATA:  Colon carcinoma status post low anterior resection. CT demonstrates enlarging presacral fluid collection. Drainage requested. EXAM: CT GUIDED ASPIRATION OF PELVIC FLUID COLLECTION ANESTHESIA/SEDATION: Intravenous Fentanyl and Versed were administered as conscious sedation during continuous monitoring of the patient's level of consciousness and physiological / cardiorespiratory status by the radiology RN, with a total moderate sedation time of 11 minutes. PROCEDURE: The procedure risks, benefits, and alternatives were explained to the patient. Questions regarding the procedure were encouraged and answered. The patient understands and consents to the procedure. Patient placed prone. Select axial scans through the pelvis obtained in the collection was localized. An appropriate skin entry site was determined and marked. The operative field was prepped with chlorhexidinein a sterile fashion, and a sterile drape was applied covering the operative field. A sterile gown and sterile gloves were used for the procedure. Local anesthesia was provided with 1% Lidocaine. Under CT fluoroscopic guidance, a 19 gauge percutaneous entry needle was advanced  into the collection. Only a scant amount of serosanguineous fluid could be aspirated, less than 2 cc. This was sent for Gram stain and culture. Residual collection too small for drain catheter placement. Postprocedure scans show no hemorrhage or other apparent complication. The patient tolerated the  procedure well. COMPLICATIONS: None immediate FINDINGS: Less than 2 mL serosanguineous fluid from presacral fluid collection, sent for Gram stain and culture. IMPRESSION: 1. Technically successful CT aspiration of presacral fluid collection, returning less than 2 mL serosanguineous fluid, sent for Gram stain and culture. Electronically Signed   By: Lucrezia Europe M.D.   On: 08/01/2017 08:47    Assessment: s/p Procedure(s): XI ROBOTIC ASSISTED LOWER ANTERIOR RESECTION ERAS PATHWAY DIVERTING LOOP OSTOMY Patient Active Problem List   Diagnosis Date Noted  . Ileostomy in place [facility]) 07/30/2017  . Ileus, postoperative ([place]) 07/30/2017  . Hypokalemia 07/30/2017  . Protein-calorie malnutrition, severe 07/29/2017  . Genetic testing 04/17/2017  . Port-A-Cath in place 04/17/2017  . Family history of colon cancer 03/29/2017  . Family history of melanoma 03/29/2017  . Family history of pancreatic cancer 03/29/2017  . Family history of testicular cancer 03/29/2017  . Rectal adenocarcinoma [id number] s/p LAR/ileostomy 07/20/2017 12/07/2016    Post op ileus.  CT shows ileus with presacral fluid collection. His wbc continues to trend up despite antibiotic treatment.    CT aspiration shows seroma with no signs of leak.  Cx's NGTD  Plan: NPO, sips of clears Cont MIV.   Cont TPN TOradol and Robaxin PRN. Cont dilaudid pca Ambulate Cont zosyn until cx results return, since wbc trending down.         LOS: 13 days     .Rosario Adie, [facility], [place]   08/02/2017 8:55 AM

## 2017-08-03 LAB — COMPREHENSIVE METABOLIC PANEL
ALBUMIN: 2.6 g/dL — AB (ref 3.5–5.0)
ALT: 32 U/L (ref 17–63)
ANION GAP: 11 (ref 5–15)
AST: 17 U/L (ref 15–41)
Alkaline Phosphatase: 118 U/L (ref 38–126)
BILIRUBIN TOTAL: 1 mg/dL (ref 0.3–1.2)
BUN: 14 mg/dL (ref 6–20)
CHLORIDE: 94 mmol/L — AB (ref 101–111)
CO2: 29 mmol/L (ref 22–32)
Calcium: 8.8 mg/dL — ABNORMAL LOW (ref 8.9–10.3)
Creatinine, Ser: 0.83 mg/dL (ref 0.61–1.24)
GFR calc Af Amer: >60 mL/min (ref >60)
GFR calc non Af Amer: >60 mL/min (ref >60)
GLUCOSE: 150 mg/dL — AB (ref 65–99)
POTASSIUM: 4.1 mmol/L (ref 3.5–5.1)
SODIUM: 134 mmol/L — AB (ref 135–145)
TOTAL PROTEIN: 7.2 g/dL (ref 6.5–8.1)

## 2017-08-03 LAB — CBC
HCT: 35.6 % — ABNORMAL LOW (ref 39.0–52.0)
HEMOGLOBIN: 12 g/dL — AB (ref 13.0–17.0)
MCH: 30.2 pg (ref 26.0–34.0)
MCHC: 33.7 g/dL (ref 30.0–36.0)
MCV: 89.7 fL (ref 78.0–100.0)
PLATELETS: 606 10*3/uL — AB (ref 150–400)
RBC: 3.97 MIL/uL — AB (ref 4.22–5.81)
RDW: 13.2 % (ref 11.5–15.5)
WBC: 13 10*3/uL — AB (ref 4.0–10.5)

## 2017-08-03 LAB — GLUCOSE, CAPILLARY
Glucose-Capillary: 124 mg/dL — ABNORMAL HIGH (ref 65–99)
Glucose-Capillary: 146 mg/dL — ABNORMAL HIGH (ref 65–99)

## 2017-08-03 LAB — MAGNESIUM: Magnesium: 1.9 mg/dL (ref 1.7–2.4)

## 2017-08-03 LAB — PHOSPHORUS: Phosphorus: 3.6 mg/dL (ref 2.5–4.6)

## 2017-08-03 MED ORDER — MAGNESIUM SULFATE IN D5W 1-5 GM/100ML-% IV SOLN
1.0000 g | Freq: Once | INTRAVENOUS | Status: AC
  Administered 2017-08-03: 1 g via INTRAVENOUS
  Filled 2017-08-03: qty 100

## 2017-08-03 MED ORDER — TRACE MINERALS CR-CU-MN-SE-ZN 10-1000-500-60 MCG/ML IV SOLN
INTRAVENOUS | Status: AC
  Administered 2017-08-03: 17:00:00 via INTRAVENOUS
  Filled 2017-08-03: qty 1800

## 2017-08-03 MED ORDER — FAT EMULSION PLANT BASED 20 % IV EMUL
240.0000 mL | INTRAVENOUS | Status: AC
  Administered 2017-08-03: 240 mL via INTRAVENOUS
  Filled 2017-08-03: qty 250

## 2017-08-03 NOTE — Consult Note (Signed)
[place] nurse follow up today. Patient is sleeping and wishes to wait until tomorrow for pouch change.  Last pouch change was Tuesday so Friday would be good schedule for the patient. He has done the change independently but he is agreeable to having [place] nurse observe him complete pouch change in entirety tomorrow.   [place], [place], [place]

## 2017-08-03 NOTE — Discharge Instructions (Addendum)
ABDOMINAL SURGERY: POST OP INSTRUCTIONS  1. DIET:  Only take a few bites of food per day.  You can also have sips of liquids throughout the day 2. PAIN CONTROL: a. Pain is best controlled by a usual combination of three different methods TOGETHER: i. Ice/Heat ii. Over the counter pain medication iii. Prescription pain medication b. Most patients will experience some swelling and bruising around the incisions.  Ice packs or heating pads (30-60 minutes up to 6 times a day) will help. Use ice for the first few days to help decrease swelling and bruising, then switch to heat to help relax tight/sore spots and speed recovery.  Some people prefer to use ice alone, heat alone, alternating between ice & heat.  Experiment to what works for you.  Swelling and bruising can take several weeks to resolve.   c. It is helpful to take an over-the-counter pain medication regularly for the first few weeks.  Choose one of the following that works best for you: i. Naproxen (Aleve, etc)  Two 220mg  tabs twice a day ii. Ibuprofen (Advil, etc) Three 200mg  tabs four times a day (every meal & bedtime) iii. Acetaminophen (Tylenol, etc) 500-650mg  four times a day (every meal & bedtime) d. A  prescription for pain medication (such as oxycodone, hydrocodone, etc) should be given to you upon discharge.  Take your pain medication as prescribed.  i. If you are having problems/concerns with the prescription medicine (does not control pain, nausea, vomiting, rash, itching, etc), please call us [phone] to see if we need to switch you to a different pain medicine that will work better for you and/or control your side effect better. ii. If you need a refill on your pain medication, please contact your pharmacy.  They will contact our office to request authorization. Prescriptions will not be filled after 5 pm or on week-ends. 3. Avoid getting constipated.  Between the surgery and the pain medications, it is common to experience  some constipation.  Increasing fluid intake and taking a fiber supplement (such as Metamucil, Citrucel, FiberCon, MiraLax, etc) 1-2 times a day regularly will usually help prevent this problem from occurring.  A mild laxative (prune juice, Milk of Magnesia, MiraLax, etc) should be taken according to package directions if there are no bowel movements after 48 hours.   4. Watch out for diarrhea.  If you have many loose bowel movements, simplify your diet to bland foods & liquids for a few days.  Stop any stool softeners and decrease your fiber supplement.  Switching to mild anti-diarrheal medications (Kayopectate, Pepto Bismol) can help.  If this worsens or does not improve, please call us. 5. Wash / shower every day.  You may shower over the incision / wound.  Avoid baths until the skin is fully healed.  Continue to shower over incision(s) after the dressing is off. 6. You may leave the incision open to air.  You may replace a dressing/Band-Aid to cover the incision for comfort if you wish. 7. ACTIVITIES as tolerated:   a. You may resume regular (light) daily activities beginning the next day--such as daily self-care, walking, climbing stairs--gradually increasing activities as tolerated.  If you can walk 30 minutes without difficulty, it is safe to try more intense activity such as jogging, treadmill, bicycling, low-impact aerobics, swimming, etc. b. Save the most intensive and strenuous activity for last such as sit-ups, heavy lifting, contact sports, etc  Refrain from any heavy lifting or straining until you are off narcotics  for pain control.   c. DO NOT PUSH THROUGH PAIN.  Let pain be your guide: If it hurts to do something, don't do it.  Pain is your body warning you to avoid that activity for another week until the pain goes down. d. You may drive when you are no longer taking prescription pain medication, you can comfortably wear a seatbelt, and you can safely maneuver your car and apply  brakes. e. Dennis Bast may have sexual intercourse when it is comfortable.  8. FOLLOW UP in our office a. Please call [facility] at [phone] to set up an appointment to see your surgeon in the office for a follow-up appointment approximately 1-2 weeks after your surgery. b. Make sure that you call for this appointment the day you arrive home to insure a convenient appointment time. 10. IF YOU HAVE DISABILITY OR FAMILY LEAVE FORMS, BRING THEM TO THE OFFICE FOR PROCESSING.  DO NOT GIVE THEM TO YOUR DOCTOR.   WHEN TO CALL us [phone]: 1. Poor pain control 2. Reactions / problems with new medications (rash/itching, nausea, etc)  3. Fever over 101.5 F (38.5 C) 4. Inability to urinate 5. Nausea and/or vomiting 6. Worsening swelling or bruising 7. Continued bleeding from incision. 8. Increased pain, redness, or drainage from the incision  The clinic staff is available to answer your questions during regular business hours (8:30am-5pm).  Please dont hesitate to call and ask to speak to one of our nurses for clinical concerns.   A surgeon from [facility] is always on call at the hospitals   If you have a medical emergency, go to the nearest emergency room or call 911.    [facility], [place], [place], [place], [place]  [address] ? MAIN: [phone] ? TOLL FREE: [phone] ? FAX [phone] [email or web address]    Loop Ileostomy, Care After Refer to this sheet in the next few weeks. These instructions provide you with information about caring for yourself after your procedure. Your health care provider may also give you more specific instructions. Your treatment has been planned according to current medical practices, but problems sometimes occur. Call your health care provider if you have any problems or questions after your procedure. What can I expect after the procedure? After the procedure, it is common to have:  A small  amount of blood or clear fluid leaking from your stoma.  Pain and discomfort in your abdomen, especially around your stoma.  Irregular bowel movements for several days.  Loose stool.  Follow these instructions at home: Medicines  Take over-the-counter and prescription medicines only as told by your health care provider.  If you were prescribed an antibiotic medicine, take it as told by your health care provider. Do not stop taking the antibiotic even if you start to feel better. Stoma Care  Keep your stoma and the surrounding skin clean and dry.  Follow your health care providers instructions about how to take care of your stoma. It is important to: ? Wash your hands with soap and water before you change your bandage (dressing). If soap and water are not available, use hand sanitizer. ? Change your dressing as told by your health care provider. ? Leave stitches (sutures), skin glue, or adhesive strips in place. In some cases, these skin closures may need to be in place for 2 weeks or longer.   Check your stoma area every day for signs of infection. Check for: ? More redness, swelling,  or pain. ? More fluid or blood. ? Warmth. ? Pus or a bad smell.  Follow your health care providers instructions about changing and cleaning your ostomy pouch.  Keep supplies to care for your stoma and ostomy pouch with you at all times.  For the first couple weeks after you get home, record your daily ostomy output.  If it is >1259ml, take 1 tablet of imodium before meals.  If it is greater than 1570ml, take 2 tabs of imodium before meals.  If you do not see a reduction in your ostomy output over the next 24 hrs, call the office.               Eating and drinking   Follow instructions from your health care provider about eating or drinking restrictions.  Pay attention to which foods and drinks cause problems with digestion, such as gas, constipation, or diarrhea.  Avoid spicy  foods and caffeine while your stoma heals.  Eat meals and snacks at regular intervals.  Drink enough fluid to keep your urine clear or pale yellow.  If your urine becomes very dark, call the office. Activity  Return to your normal activities as told by your health care provider. Ask your health care provider what activities are safe for you.  Rest as much as possible while your stoma heals.  Avoid intense physical activity for as long as you are told by your health care provider.  Do not lift anything that is heavier than 10 lb (4.5 kg) for 6 weeks or as long as told by your health care provider. Driving  Do not drive or operate heavy machinery while taking prescription pain medicine. General instructions  Do not take baths, swim, or use a hot tub until your health care provider approves.  Do not use tobacco products, including cigarettes, chewing tobacco, or e-cigarettes. If you need help quitting, ask your health care provider.  Keep all follow-up visits as told by your health care provider. This is important. Contact a health care provider if:  You have more redness, swelling, or pain around your stoma.  You have more fluid or blood coming from your stoma.  Your stoma feels warm to the touch.  You have pus or a bad smell coming from your stoma.  You have a fever.  You have loose stools that do not get firmer after several weeks.  You have bowel movements more or less often than is expected by your health care provider.  You feel nauseous.  You vomit.  You have abdominal pain, bloating, pressure, or cramping.  You have problems with sexual activity.  You have an unusual lack of energy (fatigue).  You are unusually thirsty or you always have a dry mouth. Get help right away if:  You feel dizzy or lightheaded.  You have abdominal pain or cramps that do not go away with medicine or get worse.  Your stoma suddenly changes size or color.  You have shortness of  breath.  You have bleeding from your stoma that does not stop.  You vomit more than once.  You faint.  You have internal tissue coming out of your stoma (prolapse).  You have an irregular heartbeat.  You have chest pain. This information is not intended to replace advice given to you by your health care provider. Make sure you discuss any questions you have with your health care provider. Document Released: 11/15/2010 Document Revised: 04/30/2015 Document Reviewed: 01/23/2015 Elsevier Interactive Patient Education  2018  [employer].

## 2017-08-03 NOTE — Progress Notes (Signed)
PHARMACY - ADULT TOTAL PARENTERAL NUTRITION CONSULT NOTE   Pharmacy Consult for TPN Indication: prolonged NPO status, ileus  Patient Measurements: Height: 6\' 2"  (188 cm) Weight: 157 lb 13.6 oz (71.6 kg) IBW/kg (Calculated) : 82.2 TPN AdjBW (KG): 73 Body mass index is 20.27 kg/m. Usual Weight: 82 kg  Insulin Requirements: 3 units SSI last 24hr  Current Nutrition: NPO   IVF: LR at 50 ml/hr  Central access: double lumen PAC TPN start date: 07/28/17  ASSESSMENT                                                                                                          HPI: 63 yoM with PMH colorectal cancer s/p chemo/rads and now s/p lower anterior resection with diverting loop ileostomy performed on 4/4. On 4/11 the patient developed post-op ileus and was made NPO. Also treating for potential anastomotic leak seen on CT as small presacral fluid collection. The patient's nutritional status has been poor for quite some time despite being on a diet until recently. Pharmacy consulted to start TPN. Spoke with RD; considering patient high risk for refeeding given prolonged poor PO intake.  Significant events:  - 4/15: aspiration of pelvic collection - 4/16: IV port clotted. TPN infusion d/ced at 1730 and resumed back at ~2200  Today:   Glucose (goal <150): at goal  Electrolytes: Na slightly low at 134, Mag 1.9: CorrCa, K, phos wnl - goal:  K > 4.0, Mg > 2.0 with ileus  Renal: scr wnl  LFTs:  WNL  TGs:  129 (4/15)  Prealbumin: 10.7 (4/15)  NUTRITIONAL GOALS                                                                                             RD recs: Kcal:  2000-2200 (29-32 kcal/kg) Protein:  80-100 grams/day (1.2-1.4 grams/kg) Fluid:  2.0-2.2 L/day  Clinimix E 5/20 at a goal rate of 75 ml/hr + 20% fat emulsion at 20 ml/hr x 12 hr to provide: 90 g/day protein, 2169 Kcal/day.  PLAN            Now: - Magnesium sulfate 1gm IV x1  At 1800 today:  continue Clinimix E 5/20 at 75 ml/hr (goal)  20% fat emulsion at 20 ml/hr  TPN to contain standard multivitamins and trace elements  continue IVF at 50 ml/hr  continue sensitive SSI to q8h  TPN lab panels on Mondays & Thursdays  F/u daily   Dia Sitter, PharmD, BCPS 08/03/2017 7:09 AM

## 2017-08-03 NOTE — Progress Notes (Signed)
14 Days Post-Op Robotic LAR with diverting loop ileostomy Subjective: abd pain better.  Feeling a bit hungry.  NG functioning  Objective: Vital signs in last 24 hours: Temp:  [98.8 F (37.1 C)-99.5 F (37.5 C)] 99.5 F (37.5 C) (04/18 0538) Pulse Rate:  [72-86] 80 (04/18 0538) Resp:  [10-18] 16 (04/18 0538) BP: (97-100)/(55-70) 98/70 (04/18 0538) SpO2:  [80 %-100 %] 99 % (04/18 0538) FiO2 (%):  [27 %-47 %] 34 % (04/18 0411)   Intake/Output from previous day: 04/17 0701 - 04/18 0700 In: 3045.3 [P.O.:360; I.V.:2485.3; IV Piggyback:200] Out: 1856 [Urine:954; Emesis/NG output:1400; Stool:800] Intake/Output this shift: No intake/output data recorded.   General appearance: alert and cooperative GI: normal findings: soft, mildly distended, minimally tender  Incision: no significant drainage  Lab Results:  Recent Labs    08/02/17 0429 08/03/17 0450  WBC 12.9* 13.0*  HGB 11.9* 12.0*  HCT 35.7* 35.6*  PLT 585* 606*   BMET Recent Labs    08/02/17 0429 08/03/17 0450  NA 134* 134*  K 4.0 4.1  CL 93* 94*  CO2 28 29  GLUCOSE 139* 150*  BUN 13 14  CREATININE 0.95 0.83  CALCIUM 8.5* 8.8*   PT/INR Recent Labs    07/31/17 1044  LABPROT 13.4  INR 1.03   ABG No results for input(s): PHART, HCO3 in the last 72 hours.  Invalid input(s): PCO2, PO2  MEDS, Scheduled . acetaminophen  1,000 mg Oral TID  . enoxaparin (LOVENOX) injection  40 mg Subcutaneous Q24H  . HYDROmorphone   Intravenous Q4H  . insulin aspart  0-9 Units Subcutaneous Q8H  . lip balm  1 application Topical BID  . oxymetazoline  1 spray Each Nare BID  . psyllium  1 packet Oral Daily  . simethicone  80 mg Oral TID    Studies/Results: Dg Abd 1 View  Result Date: 08/01/2017 CLINICAL DATA:  42 year old male with a history of nasogastric tube placement EXAM: ABDOMEN - 1 VIEW COMPARISON:  CT 07/30/2017 FINDINGS: Persistently dilated small bowel loops throughout the abdomen. Gastric tube terminates within  the stomach. Ostomy apparatus on the right lower quadrant. IMPRESSION: Gastric tube terminates within the stomach. Persistently dilated small bowel loops compatible with obstruction or ileus. Ostomy apparatus on the right lower quadrant. Electronically Signed   By: Corrie Mckusick D.O.   On: 08/01/2017 13:12    Assessment: s/p Procedure(s): XI ROBOTIC ASSISTED LOWER ANTERIOR RESECTION ERAS PATHWAY DIVERTING LOOP OSTOMY Patient Active Problem List   Diagnosis Date Noted  . Ileostomy in place [facility]) 07/30/2017  . Ileus, postoperative ([place]) 07/30/2017  . Hypokalemia 07/30/2017  . Protein-calorie malnutrition, severe 07/29/2017  . Genetic testing 04/17/2017  . Port-A-Cath in place 04/17/2017  . Family history of colon cancer 03/29/2017  . Family history of melanoma 03/29/2017  . Family history of pancreatic cancer 03/29/2017  . Family history of testicular cancer 03/29/2017  . Rectal adenocarcinoma [id number] s/p LAR/ileostomy 07/20/2017 12/07/2016    Post op ileus.  CT shows ileus with presacral fluid collection.   CT aspiration of presacral collection shows seroma with no signs of leak.  Cx's NGTD  Plan: NPO, sips of clears Cont MIV.   Cont TPN Pain control: Toradol and Robaxin PRN. dilaudid pca Ambulate Cultures show no growth for 2 days.  Will d/c antibiotics as he does not seem to have an identifiable infection         LOS: 14 days     .Rosario Adie, MD [facility], [place]  08/03/2017 7:50 AM

## 2017-08-04 LAB — GLUCOSE, CAPILLARY
GLUCOSE-CAPILLARY: 146 mg/dL — AB (ref 65–99)
Glucose-Capillary: 138 mg/dL — ABNORMAL HIGH (ref 65–99)
Glucose-Capillary: 135 mg/dL — ABNORMAL HIGH (ref 65–99)
Glucose-Capillary: 133 mg/dL — ABNORMAL HIGH (ref 65–99)

## 2017-08-04 MED ORDER — FAT EMULSION PLANT BASED 20 % IV EMUL
240.0000 mL | INTRAVENOUS | Status: AC
  Administered 2017-08-04: 240 mL via INTRAVENOUS
  Filled 2017-08-04: qty 250

## 2017-08-04 MED ORDER — TRACE MINERALS CR-CU-MN-SE-ZN 10-1000-500-60 MCG/ML IV SOLN
INTRAVENOUS | Status: AC
  Administered 2017-08-04: 17:00:00 via INTRAVENOUS
  Filled 2017-08-04: qty 1800

## 2017-08-04 NOTE — Progress Notes (Signed)
PHARMACY - ADULT TOTAL PARENTERAL NUTRITION CONSULT NOTE   Pharmacy Consult for TPN Indication: prolonged NPO status, ileus  Patient Measurements: Height: 6\' 2"  (188 cm) Weight: 150 lb 12.7 oz (68.4 kg) IBW/kg (Calculated) : 82.2 TPN AdjBW (KG): 73 Body mass index is 19.36 kg/m. Usual Weight: 82 kg  Insulin Requirements: 4 units SSI last 24hr  Current Nutrition: NPO   IVF: LR at 50 ml/hr  Central access: double lumen PAC TPN start date: 07/28/17  ASSESSMENT                                                                                                          HPI: 47 yoM with PMH colorectal cancer s/p chemo/rads and now s/p lower anterior resection with diverting loop ileostomy performed on 4/4. On 4/11 the patient developed post-op ileus and was made NPO. Also treating for potential anastomotic leak seen on CT as small presacral fluid collection. The patient's nutritional status has been poor for quite some time despite being on a diet until recently. Pharmacy consulted to start TPN. Spoke with RD; considering patient high risk for refeeding given prolonged poor PO intake.  Significant events:  - 4/15: aspiration of pelvic collection - 4/16: IV port clotted. TPN infusion d/ced at 1730 and resumed back at ~2200  Today:   Glucose (goal <150): at goal  Electrolytes: no new labs today - goal:  K > 4.0, Mg > 2.0 with ileus  Renal: scr wnl  LFTs:  WNL  TGs:  129 (4/15)  Prealbumin: 10.7 (4/15), 13 (4/18)  NUTRITIONAL GOALS                                                                                             RD recs: Kcal:  2000-2200 (29-32 kcal/kg) Protein:  80-100 grams/day (1.2-1.4 grams/kg) Fluid:  2.0-2.2 L/day  Clinimix E 5/20 at a goal rate of 75 ml/hr + 20% fat emulsion at 20 ml/hr x 12 hr to provide: 90 g/day protein, 2169 Kcal/day.  PLAN                                                                                                    At 1800  today:  continue Clinimix E 5/20 at 75 ml/hr (goal)  20% fat emulsion  at 20 ml/hr  TPN to contain standard multivitamins and trace elements  continue IVF at 50 ml/hr  continue sensitive SSI to q8h  TPN lab panels on Mondays & Thursdays  F/u daily   Dia Sitter, PharmD, BCPS 08/04/2017 7:20 AM

## 2017-08-04 NOTE — Progress Notes (Signed)
15 Days Post-Op   Subjective/Chief Complaint: No complaints   Objective: Vital signs in last 24 hours: Temp:  [98.6 F (37 C)-99.5 F (37.5 C)] 99.5 F (37.5 C) (04/19 0946) Pulse Rate:  [69-94] 94 (04/19 0946) Resp:  [12-18] 18 (04/19 0946) BP: (95-112)/(65-70) 95/65 (04/19 0946) SpO2:  [94 %-100 %] 94 % (04/19 0946) Weight:  [68.4 kg (150 lb 12.7 oz)] 68.4 kg (150 lb 12.7 oz) (04/19 0507) Last BM Date: 08/02/17  Intake/Output from previous day: 04/18 0701 - 04/19 0700 In: 2094.8 [P.O.:177; I.V.:1917.8] Out: 0093 [Urine:1150; Emesis/NG output:1500; GHWEX:9371] Intake/Output this shift: Total I/O In: -  Out: 350 [Emesis/NG output:350]  General appearance: alert and cooperative Resp: clear to auscultation bilaterally Cardio: regular rate and rhythm GI: soft, minimal tenderness. ostomy pink and productive  Lab Results:  Recent Labs    08/02/17 0429 08/03/17 0450  WBC 12.9* 13.0*  HGB 11.9* 12.0*  HCT 35.7* 35.6*  PLT 585* 606*   BMET Recent Labs    08/02/17 0429 08/03/17 0450  NA 134* 134*  K 4.0 4.1  CL 93* 94*  CO2 28 29  GLUCOSE 139* 150*  BUN 13 14  CREATININE 0.95 0.83  CALCIUM 8.5* 8.8*   PT/INR No results for input(s): LABPROT, INR in the last 72 hours. ABG No results for input(s): PHART, HCO3 in the last 72 hours.  Invalid input(s): PCO2, PO2  Studies/Results: No results found.  Anti-infectives: Anti-infectives (From admission, onward)   Start     Dose/Rate Route Frequency Ordered Stop   07/31/17 1000  piperacillin-tazobactam (ZOSYN) IVPB 3.375 g  Status:  Discontinued     3.375 g 12.5 mL/hr over 240 Minutes Intravenous Every 8 hours 07/31/17 0834 08/03/17 0738   07/27/17 1800  ertapenem (INVANZ) 1 g in sodium chloride 0.9 % 100 mL IVPB  Status:  Discontinued     1 g 200 mL/hr over 30 Minutes Intravenous Every 24 hours 07/27/17 1632 07/31/17 0834   07/20/17 2000  cefoTEtan (CEFOTAN) 2 g in sodium chloride 0.9 % 100 mL IVPB  Status:   Discontinued     2 g 200 mL/hr over 30 Minutes Intravenous Every 12 hours 07/20/17 1346 07/20/17 1415   07/20/17 2000  cefoTEtan in Dextrose 5% (CEFOTAN) IVPB 2 g     2 g Intravenous Every 12 hours 07/20/17 1415 07/20/17 1930   07/20/17 0540  cefoTEtan in Dextrose 5% (CEFOTAN) IVPB 2 g     2 g Intravenous On call to O.R. 07/20/17 0540 07/20/17 0817      Assessment/Plan: s/p Procedure(s): XI ROBOTIC ASSISTED LOWER ANTERIOR RESECTION ERAS PATHWAY (N/A) DIVERTING LOOP COLOSTOMY (N/A) ileus seems to be slowly improving. will try clamping ng today  ambulate  LOS: 15 days    TOTH III,Kasson Lamere S 08/04/2017

## 2017-08-04 NOTE — Consult Note (Signed)
[place] Nurse ostomy follow up Stoma type/location: RUQ Ileostomy Stomal assessment/size: 1 1/2"  Peristomal assessment: intact Treatment options for stomal/peristomal skin: barrier ring Output liquid green effluent/NG tube in place as well Ostomy pouching: 1pc.convex with barrier ring Education provided: Patient completely independent with pouch change.  MEasured cut and applied barrier ring and pouch today.  Roll closure as well.  Enrolled patient in [facility] Start Discharge program: Yes [place] team will follow.  Patient anticipates Monday discharge Domenic Moras RN BSN [facility] Pager [phone]

## 2017-08-05 LAB — AEROBIC/ANAEROBIC CULTURE (SURGICAL/DEEP WOUND)

## 2017-08-05 LAB — BASIC METABOLIC PANEL
ANION GAP: 14 (ref 5–15)
BUN: 15 mg/dL (ref 6–20)
CO2: 29 mmol/L (ref 22–32)
Calcium: 9.2 mg/dL (ref 8.9–10.3)
Chloride: 91 mmol/L — ABNORMAL LOW (ref 101–111)
Creatinine, Ser: 0.75 mg/dL (ref 0.61–1.24)
Glucose, Bld: 143 mg/dL — ABNORMAL HIGH (ref 65–99)
Potassium: 4 mmol/L (ref 3.5–5.1)
Sodium: 134 mmol/L — ABNORMAL LOW (ref 135–145)

## 2017-08-05 LAB — AEROBIC/ANAEROBIC CULTURE W GRAM STAIN (SURGICAL/DEEP WOUND): Culture: NO GROWTH

## 2017-08-05 LAB — GLUCOSE, CAPILLARY
GLUCOSE-CAPILLARY: 127 mg/dL — AB (ref 65–99)
GLUCOSE-CAPILLARY: 144 mg/dL — AB (ref 65–99)
Glucose-Capillary: 161 mg/dL — ABNORMAL HIGH (ref 65–99)

## 2017-08-05 LAB — MAGNESIUM: Magnesium: 1.9 mg/dL (ref 1.7–2.4)

## 2017-08-05 MED ORDER — TRACE MINERALS CR-CU-MN-SE-ZN 10-1000-500-60 MCG/ML IV SOLN
INTRAVENOUS | Status: AC
  Administered 2017-08-05: 18:00:00 via INTRAVENOUS
  Filled 2017-08-05: qty 1800

## 2017-08-05 MED ORDER — FAT EMULSION PLANT BASED 20 % IV EMUL
240.0000 mL | INTRAVENOUS | Status: AC
  Administered 2017-08-05: 240 mL via INTRAVENOUS
  Filled 2017-08-05: qty 250

## 2017-08-05 MED ORDER — MAGNESIUM SULFATE IN D5W 1-5 GM/100ML-% IV SOLN
1.0000 g | Freq: Once | INTRAVENOUS | Status: AC
  Administered 2017-08-05: 1 g via INTRAVENOUS
  Filled 2017-08-05: qty 100

## 2017-08-05 NOTE — Progress Notes (Signed)
16 Days Post-Op   Subjective/Chief Complaint: Main complaint is right ear pain.  Tolerated some clamping trials.  No n/v.     Objective: Vital signs in last 24 hours: Temp:  [98.1 F (36.7 C)-99.8 F (37.7 C)] 98.1 F (36.7 C) (04/20 0546) Pulse Rate:  [77-94] 77 (04/20 0546) Resp:  [12-18] 17 (04/20 0754) BP: (95-122)/(65-75) 101/72 (04/20 0546) SpO2:  [94 %-100 %] 99 % (04/20 0754) FiO2 (%):  [34 %] 34 % (04/19 1401) Last BM Date: 08/02/17  Intake/Output from previous day: 04/19 0701 - 04/20 0700 In: 2413.8 [I.V.:2413.8] Out: 2600 [Urine:925; Emesis/NG output:1075; Stool:600] Intake/Output this shift: No intake/output data recorded.  General appearance: alert and cooperative Resp: breathing comfortably Cardio: regular rate  GI: soft, non tender.  Liquid stool and gas in ostomy bag.  Lab Results:  Recent Labs    08/03/17 0450  WBC 13.0*  HGB 12.0*  HCT 35.6*  PLT 606*   BMET Recent Labs    08/03/17 0450 08/05/17 0419  NA 134* 134*  K 4.1 4.0  CL 94* 91*  CO2 29 29  GLUCOSE 150* 143*  BUN 14 15  CREATININE 0.83 0.75  CALCIUM 8.8* 9.2   PT/INR No results for input(s): LABPROT, INR in the last 72 hours. ABG No results for input(s): PHART, HCO3 in the last 72 hours.  Invalid input(s): PCO2, PO2  Studies/Results: No results found.  Anti-infectives: Anti-infectives (From admission, onward)   Start     Dose/Rate Route Frequency Ordered Stop   07/31/17 1000  piperacillin-tazobactam (ZOSYN) IVPB 3.375 g  Status:  Discontinued     3.375 g 12.5 mL/hr over 240 Minutes Intravenous Every 8 hours 07/31/17 0834 08/03/17 0738   07/27/17 1800  ertapenem (INVANZ) 1 g in sodium chloride 0.9 % 100 mL IVPB  Status:  Discontinued     1 g 200 mL/hr over 30 Minutes Intravenous Every 24 hours 07/27/17 1632 07/31/17 0834   07/20/17 2000  cefoTEtan (CEFOTAN) 2 g in sodium chloride 0.9 % 100 mL IVPB  Status:  Discontinued     2 g 200 mL/hr over 30 Minutes Intravenous  Every 12 hours 07/20/17 1346 07/20/17 1415   07/20/17 2000  cefoTEtan in Dextrose 5% (CEFOTAN) IVPB 2 g     2 g Intravenous Every 12 hours 07/20/17 1415 07/20/17 1930   07/20/17 0540  cefoTEtan in Dextrose 5% (CEFOTAN) IVPB 2 g     2 g Intravenous On call to O.R. 07/20/17 0540 07/20/17 0817      Assessment/Plan: s/p Procedure(s): XI ROBOTIC ASSISTED LOWER ANTERIOR RESECTION ERAS PATHWAY (N/A) DIVERTING LOOP COLOSTOMY (N/A) D/c NGT Clears. Ambulate   LOS: 16 days    Jacob Galvan 08/05/2017

## 2017-08-05 NOTE — Progress Notes (Addendum)
PHARMACY - ADULT TOTAL PARENTERAL NUTRITION CONSULT NOTE   Pharmacy Consult for TPN Indication: prolonged ileus  Patient Measurements: Height: 6\' 2"  (188 cm) Weight: 150 lb 12.7 oz (68.4 kg) IBW/kg (Calculated) : 82.2 TPN AdjBW (KG): 73 Body mass index is 19.36 kg/m.   Insulin Requirements: 4 units SSI last 24hr  Current Nutrition: starting clear liquids today  IVF: D-5-1/2 NS + KCL 71mEq/L at 50 mL/hr  Central access: double lumen PAC TPN start date: 07/28/17  Recent Labs    08/03/17 0450 08/05/17 0419  NA 134* 134*  K 4.1 4.0  CL 94* 91*  CO2 29 29  GLUCOSE 150* 143*  BUN 14 15  CREATININE 0.83 0.75  CALCIUM 8.8* 9.2  PHOS 3.6  --   MG 1.9 1.9  ALBUMIN 2.6*  --   ALKPHOS 118  --   AST 17  --   ALT 32  --   BILITOT 1.0  --   4/20: Corr Ca: 10.3   ASSESSMENT                                                                                                          HPI: 85 yoM with PMH colorectal cancer s/p chemo/rads and now s/p lower anterior resection with diverting loop ileostomy performed on 4/4. On 4/11 the patient developed post-op ileus and was made NPO. Also treating for potential anastomotic leak seen on CT as small presacral fluid collection. The patient's nutritional status has been poor for quite some time despite being on a diet until recently. Pharmacy consulted to start TPN. Spoke with RD; considering patient high risk for refeeding given prolonged poor PO intake.  Significant events:  - 4/15: aspiration of pelvic collection - 4/16: IV port clotted. TPN infusion d/ced at 1730 and resumed back at ~2200 - 4/20: DC NG tube, beginning clear liquids  Today:   Glucose (goal <150): at goal on low doses of SSI coverage  Electrolytes: Na and Cl slightly low - goal:  K > 4.0, Mg > 2.0 with ileus;  K in range today but Mg 1.9  Renal: SCr wnl  LFTs: below ULN  TGs:  129 (4/15)  Prealbumin: 10.7 (4/15), 13 (4/18)  NUTRITIONAL GOALS                                                                                              RD recs: Kcal:  2000-2200 (29-32 kcal/kg) Protein:  80-100 grams/day (1.2-1.4 grams/kg) Fluid:  2.0-2.2 L/day  Clinimix E 5/20 at a goal rate of 75 ml/hr + 20% fat emulsion at 20 ml/hr x 12 hr to provide: 90 g/day protein, 2169 Kcal/day.  PLAN  This AM:  Mag Sulfate 1 gram IV x 1  At 1800 today:  continue Clinimix E 5/20 at 75 ml/hr (goal)  20% fat emulsion at 20 ml/hr  TPN to contain standard multivitamins and trace elements  continue IVF at 50 ml/hr  continue sensitive SSI  q8h  BMet, Mg tomorrow  TPN lab panels on Mondays & Thursdays  Follow tolerance of clear liquids and potential for subsequent advancement of diet  F/u daily  Clayburn Pert, PharmD, BCPS [phone] 08/05/2017  9:19 AM

## 2017-08-06 LAB — BASIC METABOLIC PANEL
ANION GAP: 16 — AB (ref 5–15)
BUN: 20 mg/dL (ref 6–20)
CO2: 29 mmol/L (ref 22–32)
Calcium: 9.5 mg/dL (ref 8.9–10.3)
Chloride: 87 mmol/L — ABNORMAL LOW (ref 101–111)
Creatinine, Ser: 0.94 mg/dL (ref 0.61–1.24)
GLUCOSE: 150 mg/dL — AB (ref 65–99)
POTASSIUM: 4.2 mmol/L (ref 3.5–5.1)
Sodium: 132 mmol/L — ABNORMAL LOW (ref 135–145)

## 2017-08-06 LAB — GLUCOSE, CAPILLARY
GLUCOSE-CAPILLARY: 156 mg/dL — AB (ref 65–99)
GLUCOSE-CAPILLARY: 143 mg/dL — AB (ref 65–99)
Glucose-Capillary: 133 mg/dL — ABNORMAL HIGH (ref 65–99)
Glucose-Capillary: 158 mg/dL — ABNORMAL HIGH (ref 65–99)

## 2017-08-06 LAB — MAGNESIUM: MAGNESIUM: 2.2 mg/dL (ref 1.7–2.4)

## 2017-08-06 MED ORDER — FAT EMULSION PLANT BASED 20 % IV EMUL
240.0000 mL | INTRAVENOUS | Status: AC
  Administered 2017-08-06: 240 mL via INTRAVENOUS
  Filled 2017-08-06: qty 250

## 2017-08-06 MED ORDER — POTASSIUM CHLORIDE IN NACL 20-0.9 MEQ/L-% IV SOLN
INTRAVENOUS | Status: DC
  Administered 2017-08-06 – 2017-08-08 (×3): via INTRAVENOUS
  Administered 2017-08-09: 1000 mL via INTRAVENOUS
  Administered 2017-08-09 – 2017-08-14 (×4): via INTRAVENOUS
  Filled 2017-08-06 (×10): qty 1000

## 2017-08-06 MED ORDER — TRACE MINERALS CR-CU-MN-SE-ZN 10-1000-500-60 MCG/ML IV SOLN
INTRAVENOUS | Status: AC
  Administered 2017-08-06: 18:00:00 via INTRAVENOUS
  Filled 2017-08-06: qty 1800

## 2017-08-06 NOTE — Progress Notes (Signed)
17 Days Post-Op   Subjective/Chief Complaint: Ear pain resolved.  Having some bloating today and having a little more pain.    Objective: Vital signs in last 24 hours: Temp:  [98.1 F (36.7 C)-99.4 F (37.4 C)] 99.4 F (37.4 C) (04/21 0538) Pulse Rate:  [87-105] 93 (04/21 0538) Resp:  [11-20] 11 (04/21 0822) BP: (99-113)/(74-84) 113/84 (04/21 0538) SpO2:  [92 %-100 %] 96 % (04/21 0822) Last BM Date: 08/05/17  Intake/Output from previous day: 04/20 0701 - 04/21 0700 In: 3182.4 [P.O.:480; I.V.:2602.4; IV Piggyback:100] Out: 1150 [Urine:400; Stool:750] Intake/Output this shift: Total I/O In: 240 [P.O.:240] Out: 250 [Urine:250]  General appearance: alert and cooperative Resp: breathing comfortably Cardio: regular rate  GI: soft, non tender.  Liquid stool, but no appreciable flatus in bag.    Lab Results:  No results for input(s): WBC, HGB, HCT, PLT in the last 72 hours. BMET Recent Labs    08/05/17 0419 08/06/17 0606  NA 134* 132*  K 4.0 4.2  CL 91* 87*  CO2 29 29  GLUCOSE 143* 150*  BUN 15 20  CREATININE 0.75 0.94  CALCIUM 9.2 9.5   PT/INR No results for input(s): LABPROT, INR in the last 72 hours. ABG No results for input(s): PHART, HCO3 in the last 72 hours.  Invalid input(s): PCO2, PO2  Studies/Results: No results found.  Anti-infectives: Anti-infectives (From admission, onward)   Start     Dose/Rate Route Frequency Ordered Stop   07/31/17 1000  piperacillin-tazobactam (ZOSYN) IVPB 3.375 g  Status:  Discontinued     3.375 g 12.5 mL/hr over 240 Minutes Intravenous Every 8 hours 07/31/17 0834 08/03/17 0738   07/27/17 1800  ertapenem (INVANZ) 1 g in sodium chloride 0.9 % 100 mL IVPB  Status:  Discontinued     1 g 200 mL/hr over 30 Minutes Intravenous Every 24 hours 07/27/17 1632 07/31/17 0834   07/20/17 2000  cefoTEtan (CEFOTAN) 2 g in sodium chloride 0.9 % 100 mL IVPB  Status:  Discontinued     2 g 200 mL/hr over 30 Minutes Intravenous Every 12 hours  07/20/17 1346 07/20/17 1415   07/20/17 2000  cefoTEtan in Dextrose 5% (CEFOTAN) IVPB 2 g     2 g Intravenous Every 12 hours 07/20/17 1415 07/20/17 1930   07/20/17 0540  cefoTEtan in Dextrose 5% (CEFOTAN) IVPB 2 g     2 g Intravenous On call to O.R. 07/20/17 0540 07/20/17 0817      Assessment/Plan: s/p Procedure(s): XI ROBOTIC ASSISTED LOWER ANTERIOR RESECTION ERAS PATHWAY (N/A) DIVERTING LOOP COLOSTOMY (N/A) Stay on clears today. Advised to minimize po intake if he is feeling poorly.   Continue TNA. Pt with significant hypochloremia today.  Also hyponatremia. Restart IV fluids, but NS instead of 1/2 NS Ileus.   LOS: 17 days    Stark Klein 08/06/2017

## 2017-08-06 NOTE — Progress Notes (Signed)
[place] NOTE   Pharmacy Consult for TPN Indication: prolonged ileus  Patient Measurements: Height: 6\' 2"  (188 cm) Weight: 150 lb 12.7 oz (68.4 kg) IBW/kg (Calculated) : 82.2 TPN AdjBW (KG): 73 Body mass index is 19.36 kg/m.   Insulin Requirements: 4 units SSI last 24hr  Current Nutrition: clear liquids started 4/20  IVF: D-5-1/2 NS + KCL 39mEq/L at 10 mL/hr  Central access: double lumen PAC TPN start date: 07/28/17  Recent Labs    08/05/17 0419 08/06/17 0606  NA 134* 132*  K 4.0 4.2  CL 91* 87*  CO2 29 29  GLUCOSE 143* 150*  BUN 15 20  CREATININE 0.75 0.94  CALCIUM 9.2 9.5  MG 1.9 2.2  4/20: Corr Ca: 10.3   ASSESSMENT                                                                                                          HPI: 72 yoM with PMH colorectal cancer s/p chemo/rads and now s/p lower anterior resection with diverting loop ileostomy performed on 4/4. On 4/11 the patient developed post-op ileus and was made NPO. Also treating for potential anastomotic leak seen on CT as small presacral fluid collection. The patient's nutritional status has been poor for quite some time despite being on a diet until recently. Pharmacy consulted to start TPN. Spoke with RD; considering patient high risk for refeeding given prolonged poor PO intake.  Significant events:  - 4/15: aspiration of pelvic collection - 4/16: IV port clotted. TPN infusion d/ced at 1730 and resumed back at ~2200 - 4/20: DC NG tube, beginning clear liquids - 4/21: tolerated small amounts of clear liquids, one episode of vomiting overnight per discussion with RN  Today:   Glucose (goal <150): at goal on low doses of SSI coverage  Electrolytes: Na and Cl slightly low - goal:  K > 4.0, Mg > 2.0 with ileus  Renal: SCr wnl  LFTs: below ULN (4/18)  TGs:  129 (4/15)  Prealbumin: 10.7 (4/15), 13 (4/18)  NUTRITIONAL GOALS                                                                                              RD recs: Kcal:  2000-2200 (29-32 kcal/kg) Protein:  80-100 grams/day (1.2-1.4 grams/kg) Fluid:  2.0-2.2 L/day  Clinimix E 5/20 at a goal rate of 75 ml/hr + 20% fat emulsion at 20 ml/hr x 12 hr to provide: 90 g/day protein, 2169 Kcal/day.  PLAN       At 1800 today:  continue Clinimix E 5/20 at 75 ml/hr (goal)  20% fat emulsion at 20 ml/hr  TPN to contain  standard multivitamins and trace elements  continue IVF at 10 ml/hr  continue sensitive SSI  q8h  TPN lab panels on Mondays & Thursdays  Follow tolerance of clear liquids and potential for subsequent advancement of diet, ability to wean TPN  F/u daily  Peggyann Juba, PharmD, BCPS Pager: [phone] 08/06/2017  7:24 AM

## 2017-08-07 LAB — DIFFERENTIAL
BASOS ABS: 0.1 10*3/uL (ref 0.0–0.1)
Basophils Relative: 1 %
EOS ABS: 0.2 10*3/uL (ref 0.0–0.7)
Eosinophils Relative: 2 %
LYMPHS ABS: 1.1 10*3/uL (ref 0.7–4.0)
Lymphocytes Relative: 8 %
MONO ABS: 2.2 10*3/uL — AB (ref 0.1–1.0)
Monocytes Relative: 16 %
Neutro Abs: 10.6 10*3/uL — ABNORMAL HIGH (ref 1.7–7.7)
Neutrophils Relative %: 75 %

## 2017-08-07 LAB — COMPREHENSIVE METABOLIC PANEL
ALBUMIN: 3.2 g/dL — AB (ref 3.5–5.0)
ALK PHOS: 236 U/L — AB (ref 38–126)
ALT: 116 U/L — AB (ref 17–63)
AST: 61 U/L — AB (ref 15–41)
Anion gap: 15 (ref 5–15)
BUN: 24 mg/dL — AB (ref 6–20)
CALCIUM: 9.3 mg/dL (ref 8.9–10.3)
CHLORIDE: 88 mmol/L — AB (ref 101–111)
CO2: 30 mmol/L (ref 22–32)
CREATININE: 0.98 mg/dL (ref 0.61–1.24)
GFR calc Af Amer: >60 mL/min (ref >60)
GFR calc non Af Amer: >60 mL/min (ref >60)
GLUCOSE: 140 mg/dL — AB (ref 65–99)
Potassium: 4.1 mmol/L (ref 3.5–5.1)
SODIUM: 133 mmol/L — AB (ref 135–145)
Total Bilirubin: 1.1 mg/dL (ref 0.3–1.2)
Total Protein: 9.2 g/dL — ABNORMAL HIGH (ref 6.5–8.1)

## 2017-08-07 LAB — MAGNESIUM: Magnesium: 2 mg/dL (ref 1.7–2.4)

## 2017-08-07 LAB — CBC
HEMATOCRIT: 42.9 % (ref 39.0–52.0)
HEMOGLOBIN: 14.5 g/dL (ref 13.0–17.0)
MCH: 29.6 pg (ref 26.0–34.0)
MCHC: 33.8 g/dL (ref 30.0–36.0)
MCV: 87.6 fL (ref 78.0–100.0)
Platelets: 793 10*3/uL — ABNORMAL HIGH (ref 150–400)
RBC: 4.9 MIL/uL (ref 4.22–5.81)
RDW: 13.1 % (ref 11.5–15.5)
WBC: 14.2 10*3/uL — ABNORMAL HIGH (ref 4.0–10.5)

## 2017-08-07 LAB — PREALBUMIN: Prealbumin: 22.3 mg/dL (ref 18–38)

## 2017-08-07 LAB — GLUCOSE, CAPILLARY
GLUCOSE-CAPILLARY: 151 mg/dL — AB (ref 65–99)
GLUCOSE-CAPILLARY: 154 mg/dL — AB (ref 65–99)
GLUCOSE-CAPILLARY: 169 mg/dL — AB (ref 65–99)
Glucose-Capillary: 122 mg/dL — ABNORMAL HIGH (ref 65–99)

## 2017-08-07 LAB — TRIGLYCERIDES: Triglycerides: 95 mg/dL (ref <150)

## 2017-08-07 LAB — PHOSPHORUS: PHOSPHORUS: 4.3 mg/dL (ref 2.5–4.6)

## 2017-08-07 MED ORDER — TRACE MINERALS CR-CU-MN-SE-ZN 10-1000-500-60 MCG/ML IV SOLN
INTRAVENOUS | Status: AC
  Administered 2017-08-07: 17:00:00 via INTRAVENOUS
  Filled 2017-08-07: qty 1800

## 2017-08-07 MED ORDER — FAT EMULSION PLANT BASED 20 % IV EMUL
240.0000 mL | INTRAVENOUS | Status: AC
  Administered 2017-08-07: 240 mL via INTRAVENOUS
  Filled 2017-08-07: qty 250

## 2017-08-07 NOTE — Progress Notes (Signed)
PHARMACY - ADULT TOTAL PARENTERAL NUTRITION CONSULT NOTE   Pharmacy Consult for TPN Indication: prolonged ileus  Patient Measurements: Height: _0  (188 cm) Weight: 150 lb 12.7 oz (68.4 kg) IBW/kg (Calculated) : 82.2 TPN AdjBW (KG): 73 Body mass index is 19.36 kg/m.   Insulin Requirements: 6 units SSI last 24hr  Current Nutrition: clear liquids started 4/20  IVF: NS + KCL 92mq/L at 50 mL/hr  Central access: double lumen PAC TPN start date: 07/28/17  Recent Labs    08/06/17 0606 08/07/17 0522  NA 132* 133*  K 4.2 4.1  CL 87* 88*  CO2 29 30  GLUCOSE 150* 140*  BUN 20 24*  CREATININE 0.94 0.98  CALCIUM 9.5 9.3  PHOS  --  4.3  MG 2.2 2.0  ALBUMIN  --  3.2*  ALKPHOS  --  236*  AST  --  61*  ALT  --  116*  BILITOT  --  1.1  TRIG  --  95  4/20: Corr Ca: 10.3   ASSESSMENT                                                                                                          HPI: 89yoM with PMH colorectal cancer s/p chemo/rads and now s/p lower anterior resection with diverting loop ileostomy performed on 4/4. On 4/11 the patient developed post-op ileus and was made NPO. Also treating for potential anastomotic leak seen on CT as small presacral fluid collection. The patient's nutritional status has been poor for quite some time despite being on a diet until recently. Pharmacy consulted to start TPN. Spoke with RD; considering patient high risk for refeeding given prolonged poor PO intake.  Significant events:  - 4/15: aspiration of pelvic collection - 4/16: IV port clotted. TPN infusion d/ced at 1730 and resumed back at ~2200 - 4/20: DC NG tube, beginning clear liquids - 4/21: tolerated small amounts of clear liquids, one episode of vomiting overnight per discussion with RN  Today:   Glucose (goal <150): 133-158  Electrolytes: Na and Cl slightly low; other lytes wnl - goal:  K > 4.0, Mg > 2.0 with ileus  Renal: SCr wnl  LFTs: below ULN (4/18); AST/ALT up  61/116 (4/22), Alk phos elevated 236, Tbili wnl  TGs:  129 (4/15), TG 95 (4/22)  Prealbumin: 10.7 (4/15), 13 (4/18)  NUTRITIONAL GOALS                                                                                             RD recs: Kcal:  2000-2200 (29-32 kcal/kg) Protein:  80-100 grams/day (1.2-1.4 grams/kg) Fluid:  2.0-2.2 L/day  Clinimix E 5/20 at a goal rate of  75 ml/hr + 20% fat emulsion at 20 ml/hr x 12 hr to provide: 90 g/day protein, 2169 Kcal/day.  PLAN       At 1800 today:  continue Clinimix E 5/20 at 75 ml/hr (goal)  20% fat emulsion at 20 ml/hr  TPN to contain standard multivitamins and trace elements  continue NS +20 KCL at 50 ml/hr-- per MD  continue sensitive SSI  q8h  TPN lab panels on Mondays & Thursdays  Follow tolerance of clear liquids and potential for subsequent advancement of diet, ability to wean TPN  F/u daily  Dia Sitter, PharmD, BCPS 08/07/2017 7:34 AM

## 2017-08-07 NOTE — Progress Notes (Signed)
18 Days Post-Op   Subjective/Chief Complaint: Still with some bloating but states he is tolerating the clear liquids without nausea/emesis. Stoma productive - 600cc overnight. Stable abdominal sharp intermittent pains. Ambulating some.  Objective: Vital signs in last 24 hours: Temp:  [98.6 F (37 C)-98.9 F (37.2 C)] 98.6 F (37 C) (04/22 0503) Pulse Rate:  [85-92] 85 (04/22 0503) Resp:  [12-18] 15 (04/22 0532) BP: (101-110)/(74-78) 101/78 (04/22 0503) SpO2:  [98 %-100 %] 99 % (04/22 0532) Last BM Date: 08/06/17(patient has ostomy)  Intake/Output from previous day: 04/21 0701 - 04/22 0700 In: 3197.8 [P.O.:1070; I.V.:2127.8] Out: 2275 [Urine:1425; Stool:850] Intake/Output this shift: No intake/output data recorded.  General appearance: alert and cooperative Resp: breathing comfortably Cardio: RRR  GI: Abd soft, non tender. Stoma pink, productive; semi liquid stool in appliance  Lab Results:  Recent Labs    08/07/17 0522  WBC 14.2*  HGB 14.5  HCT 42.9  PLT 793*   BMET Recent Labs    08/06/17 0606 08/07/17 0522  NA 132* 133*  K 4.2 4.1  CL 87* 88*  CO2 29 30  GLUCOSE 150* 140*  BUN 20 24*  CREATININE 0.94 0.98  CALCIUM 9.5 9.3   PT/INR No results for input(s): LABPROT, INR in the last 72 hours. ABG No results for input(s): PHART, HCO3 in the last 72 hours.  Invalid input(s): PCO2, PO2  Studies/Results: No results found.  Anti-infectives: Anti-infectives (From admission, onward)   Start     Dose/Rate Route Frequency Ordered Stop   07/31/17 1000  piperacillin-tazobactam (ZOSYN) IVPB 3.375 g  Status:  Discontinued     3.375 g 12.5 mL/hr over 240 Minutes Intravenous Every 8 hours 07/31/17 0834 08/03/17 0738   07/27/17 1800  ertapenem (INVANZ) 1 g in sodium chloride 0.9 % 100 mL IVPB  Status:  Discontinued     1 g 200 mL/hr over 30 Minutes Intravenous Every 24 hours 07/27/17 1632 07/31/17 0834   07/20/17 2000  cefoTEtan (CEFOTAN) 2 g in sodium chloride  0.9 % 100 mL IVPB  Status:  Discontinued     2 g 200 mL/hr over 30 Minutes Intravenous Every 12 hours 07/20/17 1346 07/20/17 1415   07/20/17 2000  cefoTEtan in Dextrose 5% (CEFOTAN) IVPB 2 g     2 g Intravenous Every 12 hours 07/20/17 1415 07/20/17 1930   07/20/17 0540  cefoTEtan in Dextrose 5% (CEFOTAN) IVPB 2 g     2 g Intravenous On call to O.R. 07/20/17 0540 07/20/17 0817      Assessment/Plan: s/p Procedure(s): XI ROBOTIC ASSISTED LOWER ANTERIOR RESECTION ERAS PATHWAY (N/A) DIVERTING LOOP COLOSTOMY (N/A) Will trial full liquids today Ambulate 5x/day Continue TNA; MIVF for hyponatremia; improving Ileus; if doesn't tolerate full liquids or spikes temp, will plan repeat CT A/P to further assess   LOS: 18 days   Ileana Roup 08/07/2017

## 2017-08-07 NOTE — Progress Notes (Signed)
Nutrition Follow-up  DOCUMENTATION CODES:   Severe malnutrition in context of chronic illness  INTERVENTION:    TPN per pharmacy   RN to provide Ensure Enlive po once, each supplement provides 350 kcal and 20 grams of protein to see if pt tolerates it  NUTRITION DIAGNOSIS:   Severe Malnutrition related to chronic illness, cancer and cancer related treatments as evidenced by moderate fat depletion, severe fat depletion, moderate muscle depletion, severe muscle depletion.  Ongoing  GOAL:   Patient will meet greater than or equal to 90% of their needs  Meeting with TPN  MONITOR:   Labs, I & O's, Skin, Weight trends, Diet advancement  REASON FOR ASSESSMENT:     New TPN/TNA  ASSESSMENT:   42 year old male with PMH significant for stage II colorectal cancer s/p chemo and radiation.   4/4- s/p robotic assisted lower anterior resection with splenic flexure mobilization and diverting loop ileostomy 4/11- CT shows ileus with small presacral fluid collection 4/12 - NPO,TPN 4/20- clear liquids 4/22- full liquids  Pt eating ice cream upon visit. States this has made him gassy causing abdominal pain, he could only tolerate 3 bites. Pt willing to try Ensure to maximize calories and protein. RN to give. Encouraged pt to try different full liquids options.   Weight noted to decrease 3 lb since last RD visit 4/15 (153 lb to 150 lb). Will continue to monitor.   Pt continues with Clinimix E 5/20 at a goal rate of 75 ml/hr + 20% fat emulsion at 20 ml/hr x 12 hr to provide: 90 g/day protein, 2169 Kcal/day.  Medications reviewed and include: SSI, IV abx Labs reviewed: Na 133 (L) AST 61 (H) ALT 116 (H)  Diet Order:  TPN (CLINIMIX-E) Adult TPN (CLINIMIX-E) Adult Diet full liquid Room service appropriate? Yes; Fluid consistency: Thin  EDUCATION NEEDS:   No education needs have been identified at this time  Skin:  Skin Assessment: Skin Integrity Issues: Skin Integrity Issues::  Incisions Incisions: surgical incision to abdomen  Last BM:  4/22- 225 ml via ostomy   Height:   Ht Readings from Last 1 Encounters:  07/20/17 6\' 2"  (1.88 m)    Weight:   Wt Readings from Last 1 Encounters:  08/04/17 150 lb 12.7 oz (68.4 kg)    Ideal Body Weight:  86.4 kg  BMI:  Body mass index is 19.36 kg/m.  Estimated Nutritional Needs:   Kcal:  2000-2200 (29-32 kcal/kg)  Protein:  80-100 grams/day (1.2-1.4 grams/kg)  Fluid:  2.0-2.2 L/day    Mariana Single RD, LDN Clinical Nutrition Pager # - [phone]

## 2017-08-08 ENCOUNTER — Inpatient Hospital Stay (HOSPITAL_COMMUNITY): Payer: Medicaid Other

## 2017-08-08 ENCOUNTER — Encounter (HOSPITAL_COMMUNITY): Payer: Self-pay | Admitting: Radiology

## 2017-08-08 LAB — CBC WITH DIFFERENTIAL/PLATELET
BASOS ABS: 0.1 10*3/uL (ref 0.0–0.1)
Basophils Relative: 0 %
EOS ABS: 0.1 10*3/uL (ref 0.0–0.7)
EOS PCT: 1 %
HCT: 40.1 % (ref 39.0–52.0)
Hemoglobin: 13.8 g/dL (ref 13.0–17.0)
LYMPHS PCT: 8 %
Lymphs Abs: 1 10*3/uL (ref 0.7–4.0)
MCH: 30 pg (ref 26.0–34.0)
MCHC: 34.4 g/dL (ref 30.0–36.0)
MCV: 87.2 fL (ref 78.0–100.0)
MONO ABS: 1.8 10*3/uL — AB (ref 0.1–1.0)
Monocytes Relative: 14 %
Neutro Abs: 9.7 10*3/uL — ABNORMAL HIGH (ref 1.7–7.7)
Neutrophils Relative %: 77 %
PLATELETS: 677 10*3/uL — AB (ref 150–400)
RBC: 4.6 MIL/uL (ref 4.22–5.81)
RDW: 13.2 % (ref 11.5–15.5)
WBC: 12.6 10*3/uL — AB (ref 4.0–10.5)

## 2017-08-08 LAB — GLUCOSE, CAPILLARY
GLUCOSE-CAPILLARY: 152 mg/dL — AB (ref 65–99)
GLUCOSE-CAPILLARY: 152 mg/dL — AB (ref 65–99)
Glucose-Capillary: 157 mg/dL — ABNORMAL HIGH (ref 65–99)
Glucose-Capillary: 154 mg/dL — ABNORMAL HIGH (ref 65–99)

## 2017-08-08 MED ORDER — PROMETHAZINE HCL 25 MG/ML IJ SOLN
12.5000 mg | Freq: Four times a day (QID) | INTRAMUSCULAR | Status: DC | PRN
  Administered 2017-08-08 – 2017-08-29 (×32): 12.5 mg via INTRAVENOUS
  Filled 2017-08-08 (×30): qty 1

## 2017-08-08 MED ORDER — TRACE MINERALS CR-CU-MN-SE-ZN 10-1000-500-60 MCG/ML IV SOLN
INTRAVENOUS | Status: AC
  Administered 2017-08-08: 18:00:00 via INTRAVENOUS
  Filled 2017-08-08: qty 1800

## 2017-08-08 MED ORDER — IOPAMIDOL (ISOVUE-300) INJECTION 61%
INTRAVENOUS | Status: AC
  Filled 2017-08-08: qty 30

## 2017-08-08 MED ORDER — FAT EMULSION PLANT BASED 20 % IV EMUL
240.0000 mL | INTRAVENOUS | Status: AC
  Administered 2017-08-08: 240 mL via INTRAVENOUS
  Filled 2017-08-08: qty 250

## 2017-08-08 MED ORDER — IOPAMIDOL (ISOVUE-300) INJECTION 61%: 15.0000 mL | Freq: Once | INTRAVENOUS | Status: DC | PRN

## 2017-08-08 MED ORDER — IOHEXOL 300 MG/ML  SOLN
100.0000 mL | Freq: Once | INTRAMUSCULAR | Status: AC | PRN
  Administered 2017-08-08: 100 mL via INTRAVENOUS

## 2017-08-08 NOTE — Progress Notes (Signed)
PHARMACY - ADULT TOTAL PARENTERAL NUTRITION CONSULT NOTE   Pharmacy Consult for TPN Indication: prolonged ileus  Patient Measurements: Height: _0  (188 cm) Weight: 150 lb 12.7 oz (68.4 kg) IBW/kg (Calculated) : 82.2 TPN AdjBW (KG): 73 Body mass index is 19.36 kg/m.   Insulin Requirements: 5 units SSI last 24hr  Current Nutrition: full liquids started on 4/22  IVF: NS + KCL 49mq/L at 50 mL/hr  Central access: double lumen PAC TPN start date: 07/28/17  Recent Labs    08/06/17 0606 08/07/17 0522  NA 132* 133*  K 4.2 4.1  CL 87* 88*  CO2 29 30  GLUCOSE 150* 140*  BUN 20 24*  CREATININE 0.94 0.98  CALCIUM 9.5 9.3  PHOS  --  4.3  MG 2.2 2.0  ALBUMIN  --  3.2*  ALKPHOS  --  236*  AST  --  61*  ALT  --  116*  BILITOT  --  1.1  TRIG  --  95  PREALBUMIN  --  22.3  4/20: Corr Ca: 10.3   ASSESSMENT                                                                                                          HPI: 89yoM with PMH colorectal cancer s/p chemo/rads and now s/p lower anterior resection with diverting loop ileostomy performed on 4/4. On 4/11 the patient developed post-op ileus and was made NPO. Also treating for potential anastomotic leak seen on CT as small presacral fluid collection. The patient's nutritional status has been poor for quite some time despite being on a diet until recently. Pharmacy consulted to start TPN. Spoke with RD; considering patient high risk for refeeding given prolonged poor PO intake.  Significant events:  - 4/15: aspiration of pelvic collection - 4/16: IV port clotted. TPN infusion d/ced at 1730 and resumed back at ~2200 - 4/20: DC NG tube, beginning clear liquids - 4/21: tolerated small amounts of clear liquids, one episode of vomiting overnight per discussion with RN - 4/22: trial of full liquids  Today:   Glucose (goal <150): 122-157  Electrolytes: no new labs today - goal:  K > 4.0, Mg > 2.0 with ileus  Renal: SCr wnl  LFTs:  below ULN (4/18); AST/ALT up 61/116 (4/22), Alk phos elevated 236, Tbili wnl  TGs:  129 (4/15), TG 95 (4/22)  Prealbumin: 10.7 (4/15), 13 (4/18), 22.3 (4/22)  NUTRITIONAL GOALS                                                                                             RD recs: Kcal:  2000-2200 (29-32 kcal/kg) Protein:  80-100 grams/day (1.2-1.4 grams/kg) Fluid:  2.0-2.2 L/day  Clinimix E 5/20 at a goal rate of 75 ml/hr + 20% fat emulsion at 20 ml/hr x 12 hr to provide: 90 g/day protein, 2169 Kcal/day.  PLAN       At 1800 today:  continue Clinimix E 5/20 at 75 ml/hr (goal)  20% fat emulsion at 20 ml/hr  TPN to contain standard multivitamins and trace elements  continue NS +20 KCL at 50 ml/hr-- per MD  continue sensitive SSI  q8h  TPN lab panels on Mondays & Thursdays  Follow tolerance of clear liquids and potential for subsequent advancement of diet, ability to wean TPN  F/u daily  Dia Sitter, PharmD, BCPS 08/08/2017 7:31 AM

## 2017-08-08 NOTE — Progress Notes (Signed)
Patient was complaining of nausea. RN gave him Zofran IV q6h @0310 . Md Hoxworth was paged. New orders were given.

## 2017-08-08 NOTE — Progress Notes (Signed)
19 Days Post-Op   Subjective/Chief Complaint: Still with some bloating; emesis overnight per pt. Stoma productive - 1L in last 24hrs. Stable abdominal sharp intermittent pains. Feels his abdomen is unchanged - not better, not worse. Ambulating some.  Objective: Vital signs in last 24 hours: Temp:  [98.8 F (37.1 C)-99.2 F (37.3 C)] 98.9 F (37.2 C) (04/23 0529) Pulse Rate:  [88-104] 88 (04/23 0529) Resp:  [11-28] 11 (04/23 0751) BP: (102-112)/(67-82) 107/67 (04/23 0529) SpO2:  [98 %-100 %] 100 % (04/23 0751) FiO2 (%):  [34 %] 34 % (04/22 1200) Last BM Date: 08/01/17  Intake/Output from previous day: 04/22 0701 - 04/23 0700 In: 3372.8 [P.O.:120; I.V.:3252.8] Out: 1850 [Urine:800; UXYBF:3832] Intake/Output this shift: No intake/output data recorded.  General appearance: alert and cooperative Resp: breathing comfortably Cardio: RRR  GI: Abd soft, non tender. Stoma pink, productive; semi liquid stool in appliance  Lab Results:  Recent Labs    08/07/17 0522  WBC 14.2*  HGB 14.5  HCT 42.9  PLT 793*   BMET Recent Labs    08/06/17 0606 08/07/17 0522  NA 132* 133*  K 4.2 4.1  CL 87* 88*  CO2 29 30  GLUCOSE 150* 140*  BUN 20 24*  CREATININE 0.94 0.98  CALCIUM 9.5 9.3   PT/INR No results for input(s): LABPROT, INR in the last 72 hours. ABG No results for input(s): PHART, HCO3 in the last 72 hours.  Invalid input(s): PCO2, PO2  Studies/Results: No results found.  Anti-infectives: Anti-infectives (From admission, onward)   Start     Dose/Rate Route Frequency Ordered Stop   07/31/17 1000  piperacillin-tazobactam (ZOSYN) IVPB 3.375 g  Status:  Discontinued     3.375 g 12.5 mL/hr over 240 Minutes Intravenous Every 8 hours 07/31/17 0834 08/03/17 0738   07/27/17 1800  ertapenem (INVANZ) 1 g in sodium chloride 0.9 % 100 mL IVPB  Status:  Discontinued     1 g 200 mL/hr over 30 Minutes Intravenous Every 24 hours 07/27/17 1632 07/31/17 0834   07/20/17 2000   cefoTEtan (CEFOTAN) 2 g in sodium chloride 0.9 % 100 mL IVPB  Status:  Discontinued     2 g 200 mL/hr over 30 Minutes Intravenous Every 12 hours 07/20/17 1346 07/20/17 1415   07/20/17 2000  cefoTEtan in Dextrose 5% (CEFOTAN) IVPB 2 g     2 g Intravenous Every 12 hours 07/20/17 1415 07/20/17 1930   07/20/17 0540  cefoTEtan in Dextrose 5% (CEFOTAN) IVPB 2 g     2 g Intravenous On call to O.R. 07/20/17 0540 07/20/17 0817      Assessment/Plan: s/p Procedure(s): XI ROBOTIC ASSISTED LOWER ANTERIOR RESECTION ERAS PATHWAY (N/A) DIVERTING LOOP COLOSTOMY (N/A) CT A/P today to ensure no infectious source like abscess contributing Full liquids as tolerated Ambulate 5x/day Continue TNA; MIVF; labs pending PPx: Lov, SCDs   LOS: 19 days   Ileana Roup 08/08/2017

## 2017-08-09 ENCOUNTER — Ambulatory Visit: Payer: Self-pay | Admitting: Oncology

## 2017-08-09 LAB — GLUCOSE, CAPILLARY
GLUCOSE-CAPILLARY: 137 mg/dL — AB (ref 65–99)
GLUCOSE-CAPILLARY: 135 mg/dL — AB (ref 65–99)
GLUCOSE-CAPILLARY: 141 mg/dL — AB (ref 65–99)

## 2017-08-09 LAB — COMPREHENSIVE METABOLIC PANEL
ALT: 121 U/L — AB (ref 17–63)
AST: 51 U/L — ABNORMAL HIGH (ref 15–41)
Albumin: 3 g/dL — ABNORMAL LOW (ref 3.5–5.0)
Alkaline Phosphatase: 251 U/L — ABNORMAL HIGH (ref 38–126)
Anion gap: 13 (ref 5–15)
BUN: 20 mg/dL (ref 6–20)
CHLORIDE: 89 mmol/L — AB (ref 101–111)
CO2: 29 mmol/L (ref 22–32)
CREATININE: 0.87 mg/dL (ref 0.61–1.24)
Calcium: 9.1 mg/dL (ref 8.9–10.3)
Glucose, Bld: 172 mg/dL — ABNORMAL HIGH (ref 65–99)
Potassium: 4.1 mmol/L (ref 3.5–5.1)
Sodium: 131 mmol/L — ABNORMAL LOW (ref 135–145)
Total Bilirubin: 0.6 mg/dL (ref 0.3–1.2)
Total Protein: 8.5 g/dL — ABNORMAL HIGH (ref 6.5–8.1)

## 2017-08-09 MED ORDER — FAT EMULSION PLANT BASED 20 % IV EMUL
240.0000 mL | INTRAVENOUS | Status: AC
Start: 2017-08-09 — End: 2017-08-10
  Administered 2017-08-09: 240 mL via INTRAVENOUS
  Filled 2017-08-09: qty 250

## 2017-08-09 MED ORDER — M.V.I. ADULT IV INJ
INJECTION | INTRAVENOUS | Status: AC
  Administered 2017-08-09: 18:00:00 via INTRAVENOUS
  Filled 2017-08-09: qty 1800

## 2017-08-09 MED ORDER — INSULIN ASPART 100 UNIT/ML ~~LOC~~ SOLN
0.0000 [IU] | Freq: Three times a day (TID) | SUBCUTANEOUS | Status: DC
  Administered 2017-08-09 (×2): 2 [IU] via SUBCUTANEOUS
  Administered 2017-08-10 (×2): 3 [IU] via SUBCUTANEOUS
  Administered 2017-08-10 – 2017-08-12 (×4): 2 [IU] via SUBCUTANEOUS

## 2017-08-09 NOTE — Progress Notes (Signed)
[place] NOTE   Pharmacy Consult for TPN Indication: prolonged ileus  Patient Measurements: Height: 6' 2"  (188 cm) Weight: 150 lb 12.7 oz (68.4 kg) IBW/kg (Calculated) : 82.2 TPN AdjBW (KG): 73 Body mass index is 19.36 kg/m.   Insulin Requirements: 4 units SSI yesterday  Current Nutrition: full liquids started on 4/22  IVF: NS + KCL 48mq/L at 50 mL/hr  Central access: double lumen PAC TPN start date: 07/28/17  Recent Labs    08/07/17 0522 08/09/17 0456  NA 133* 131*  K 4.1 4.1  CL 88* 89*  CO2 30 29  GLUCOSE 140* 172*  BUN 24* 20  CREATININE 0.98 0.87  CALCIUM 9.3 9.1  PHOS 4.3  --   MG 2.0  --   ALBUMIN 3.2* 3.0*  ALKPHOS 236* 251*  AST 61* 51*  ALT 116* 121*  BILITOT 1.1 0.6  TRIG 95  --   PREALBUMIN 22.3  --     ASSESSMENT                                                                                                          HPI: 89yoM with PMH colorectal cancer s/p chemo/rads and now s/p lower anterior resection with diverting loop ileostomy performed on 4/4. On 4/11 the patient developed post-op ileus and was made NPO. Also treating for potential anastomotic leak seen on CT as small presacral fluid collection. The patient's nutritional status has been poor for quite some time despite being on a diet until recently. Pharmacy consulted to start TPN. Spoke with RD; considering patient high risk for refeeding given prolonged poor PO intake.  Significant events:  - 4/15: aspiration of pelvic collection - 4/16: IV port clotted. TPN infusion d/ced at 1730 and resumed back at ~2200 - 4/20: DC NG tube, beginning clear liquids - 4/21: tolerated small amounts of clear liquids, one episode of vomiting overnight per discussion with RN - 4/22: trial of full liquids - 4/23: CT a/p shows stable fluid collection, likely non-contributory, and unchanged pSBO d/t adhesions - 4/24: continues to have bloating/emesis, although 2.4 L  stoma output yesterday; feels SBP is resolving  Today:   Glucose (goal <150): CBGs mostly controlled with several readings in 150s although most recent SBG 172  Electrolytes: Na, Cl slightly lower but > 130; K and Ca WNL. Mg, Phos WNL as of 4/22  Renal: SCr, BUN, bicarb stable WNL  LFTs: Albumin low but improved; Alk phos and ALT both elevated  TGs:  Stable < 150  Prealbumin: improved to goal range > 18  NUTRITIONAL GOALS  RD recs: Kcal:  2000-2200 (29-32 kcal/kg) Protein:  80-100 grams/day (1.2-1.4 grams/kg) Fluid:  2.0-2.2 L/day  Clinimix E 5/20 at a goal rate of 75 ml/hr + 20% fat emulsion at 20 ml/hr x 12 hr to provide: 90 g/day protein, 2169 Kcal/day.  - goal:  K > 4.0, Mg > 2.0 with ileus  PLAN       At 1800 today:  Continue Clinimix E 5/20 at 75 ml/hr (goal)  Will consider weaning TPN once SBO improves and no further emesis  20% fat emulsion at 20 ml/hr  TPN to contain standard multivitamins and trace elements  IVF per primary service, per RN patient appears euvolemic  Will increase SSI to moderate strength with Q8 hr checks  TPN lab panels on Mondays & Thursdays  F/u daily  Reuel Boom, PharmD, BCPS [phone] 08/09/2017, 11:33 AM

## 2017-08-09 NOTE — Progress Notes (Signed)
20 Days Post-Op   Subjective/Chief Complaint: Ongoing bloating; emesis x1. Stoma productive - 2.4L in last 24hrs. Ambulating throughout the day.  Objective: Vital signs in last 24 hours: Temp:  [97.7 F (36.5 C)-98.8 F (37.1 C)] 97.7 F (36.5 C) (04/24 0525) Pulse Rate:  [92-98] 98 (04/24 0525) Resp:  [12-19] 18 (04/24 0814) BP: (101-110)/(69-74) 101/69 (04/24 0525) SpO2:  [100 %] 100 % (04/24 0814) Last BM Date: 08/01/17  Intake/Output from previous day: 04/23 0701 - 04/24 0700 In: 3428.3 [P.O.:340; I.V.:3088.3] Out: 4500 [Urine:2025; Stool:2475] Intake/Output this shift: Total I/O In: -  Out: 775 [Urine:175; Stool:600]  General appearance: alert and cooperative Resp: breathing comfortably Cardio: RRR  GI: Abd soft, moderately distended, non tender. Stoma pink, productive; semi liquid stool in appliance  Lab Results:  Recent Labs    08/07/17 0522 08/08/17 1056  WBC 14.2* 12.6*  HGB 14.5 13.8  HCT 42.9 40.1  PLT 793* 677*   BMET Recent Labs    08/07/17 0522 08/09/17 0456  NA 133* 131*  K 4.1 4.1  CL 88* 89*  CO2 30 29  GLUCOSE 140* 172*  BUN 24* 20  CREATININE 0.98 0.87  CALCIUM 9.3 9.1   PT/INR No results for input(s): LABPROT, INR in the last 72 hours. ABG No results for input(s): PHART, HCO3 in the last 72 hours.  Invalid input(s): PCO2, PO2  Studies/Results: Ct Abdomen Pelvis W Contrast  Result Date: 08/08/2017 CLINICAL DATA:  Abdominal distention with aspiration of abdominal fluid collection. History of rectal cancer. EXAM: CT ABDOMEN AND PELVIS WITH CONTRAST TECHNIQUE: Multidetector CT imaging of the abdomen and pelvis was performed using the standard protocol following bolus administration of intravenous contrast. CONTRAST:  190mL OMNIPAQUE IOHEXOL 300 MG/ML  SOLN COMPARISON:  07/31/2017 and 07/30/2017 FINDINGS: Lower chest:  No contributory findings. Hepatobiliary: Stable 13 mm mass in the upper left lobe liver, previously reported as  metastasis. No acute finding.No evidence of biliary obstruction or stone. Pancreas: Unremarkable. Spleen: Unremarkable. Adrenals/Urinary Tract: Negative adrenals. No hydronephrosis or stone. Unremarkable bladder. Stomach/Bowel: Status post low anterior resection. Diverting ileostomy. Small bowel obstruction with dilated fluid-filled loops of bowel transitioning to decompressed bowel in the pelvis. The obstruction is partial based on ileostomy output. Pelvic bowel loops are matted and angulated, compatible with adhesion. There is presacral fluid collection measuring 2.6 x 1.9 x 4.8 cm. There is mild colonic wall thickening at the anastomosis that is stable and expected. No appendicitis. Vascular/Lymphatic: No acute vascular abnormality. Stable periaortic lymph nodes as previously highlighted, up to 1 cm in short axis the level of the lower pole left kidney Reproductive:Negative Other: No ascites or pneumoperitoneum. Musculoskeletal: No acute abnormalities. IMPRESSION: 1. 4.8 x 2.6 x 1.9 cm presacral fluid collection that is essentially stable from 07/30/2017. 2. Unchanged partial small bowel obstruction with transition in the pelvis where there is adhesive changes. Electronically Signed   By: Monte Fantasia M.D.   On: 08/08/2017 15:14    Anti-infectives: Anti-infectives (From admission, onward)   Start     Dose/Rate Route Frequency Ordered Stop   07/31/17 1000  piperacillin-tazobactam (ZOSYN) IVPB 3.375 g  Status:  Discontinued     3.375 g 12.5 mL/hr over 240 Minutes Intravenous Every 8 hours 07/31/17 0834 08/03/17 0738   07/27/17 1800  ertapenem (INVANZ) 1 g in sodium chloride 0.9 % 100 mL IVPB  Status:  Discontinued     1 g 200 mL/hr over 30 Minutes Intravenous Every 24 hours 07/27/17 1632 07/31/17 0834   07/20/17  2000  cefoTEtan (CEFOTAN) 2 g in sodium chloride 0.9 % 100 mL IVPB  Status:  Discontinued     2 g 200 mL/hr over 30 Minutes Intravenous Every 12 hours 07/20/17 1346 07/20/17 1415    07/20/17 2000  cefoTEtan in Dextrose 5% (CEFOTAN) IVPB 2 g     2 g Intravenous Every 12 hours 07/20/17 1415 07/20/17 1930   07/20/17 0540  cefoTEtan in Dextrose 5% (CEFOTAN) IVPB 2 g     2 g Intravenous On call to O.R. 07/20/17 0540 07/20/17 0817      Assessment/Plan: s/p Procedure(s): XI ROBOTIC ASSISTED LOWER ANTERIOR RESECTION ERAS PATHWAY (N/A) DIVERTING LOOP COLOSTOMY (N/A) CT A/P showed pSBO with probable transition in the pelvis. Stable presacral fluid collection which does not appear to abutt the bowel so I do not think this is contributing. Given prior clear aspiration, will hold off on any instrumentation of this. All of that said, his stoma output has picked up quite a bit - 2.4L in 24hrs. This is likely beginning to resolve. Strict I&O - 2L urine in last 24hrs. Holding off on any antimotility given CT findings. Full liquids- sips as tolerated. Not taking much at this time. Monitor Ambulate 5x/day Continue TNA; MIVF PPx: Lov, SCDs   LOS: 20 days   Ileana Roup 08/09/2017

## 2017-08-10 LAB — COMPREHENSIVE METABOLIC PANEL
ALT: 137 U/L — AB (ref 17–63)
ANION GAP: 14 (ref 5–15)
AST: 65 U/L — ABNORMAL HIGH (ref 15–41)
Albumin: 3.2 g/dL — ABNORMAL LOW (ref 3.5–5.0)
Alkaline Phosphatase: 305 U/L — ABNORMAL HIGH (ref 38–126)
BUN: 23 mg/dL — ABNORMAL HIGH (ref 6–20)
CHLORIDE: 87 mmol/L — AB (ref 101–111)
CO2: 29 mmol/L (ref 22–32)
CREATININE: 1.01 mg/dL (ref 0.61–1.24)
Calcium: 9.2 mg/dL (ref 8.9–10.3)
GFR calc non Af Amer: >60 mL/min (ref >60)
Glucose, Bld: 133 mg/dL — ABNORMAL HIGH (ref 65–99)
Potassium: 4 mmol/L (ref 3.5–5.1)
SODIUM: 130 mmol/L — AB (ref 135–145)
Total Bilirubin: 0.8 mg/dL (ref 0.3–1.2)
Total Protein: 8.3 g/dL — ABNORMAL HIGH (ref 6.5–8.1)

## 2017-08-10 LAB — GLUCOSE, CAPILLARY
GLUCOSE-CAPILLARY: 181 mg/dL — AB (ref 65–99)
GLUCOSE-CAPILLARY: 153 mg/dL — AB (ref 65–99)
Glucose-Capillary: 144 mg/dL — ABNORMAL HIGH (ref 65–99)

## 2017-08-10 LAB — PHOSPHORUS: PHOSPHORUS: 4.9 mg/dL — AB (ref 2.5–4.6)

## 2017-08-10 LAB — MAGNESIUM: Magnesium: 2.2 mg/dL (ref 1.7–2.4)

## 2017-08-10 MED ORDER — TRACE MINERALS CR-CU-MN-SE-ZN 10-1000-500-60 MCG/ML IV SOLN
INTRAVENOUS | Status: AC
  Administered 2017-08-10: 18:00:00 via INTRAVENOUS
  Filled 2017-08-10: qty 1440

## 2017-08-10 MED ORDER — FAT EMULSION PLANT BASED 20 % IV EMUL
240.0000 mL | INTRAVENOUS | Status: AC
  Administered 2017-08-10: 240 mL via INTRAVENOUS
  Filled 2017-08-10: qty 250

## 2017-08-10 NOTE — Consult Note (Signed)
[facility] Nurse ostomy follow up Stoma type/location: RUQ ileostomy Stomal assessment/size: 1 and 1/2 inches round, red, moist. Os at center Peristomal assessment: intact Treatment options for stomal/peristomal skin: skin barrier ring Output: thin, green effluent Ostomy pouching: 1pc convex with skin barrier ring Education provided: None today.  Patient had company earlier and has new reading material, but is getting restless and somewhat discouraged by "stalled" progress.  Friend is investigating how to bring a game system for patient's entertainment. "Mckenna" is happy that Dr. Marcello Moores will allow regular foods today as tolerated, but fearful he will be sick. He states that he is going to go "really slow". Patient is independent in pouch preparation, emptying, removal and application. Changed performed by [facility] Nurse today as patient has a washcloth over his head and is fighting nausea. Plans to walk in hall after session. Expresses appreciation for support and encouragement. Supplies in cupboard above sink.  No questions at this time. Enrolled patient in [facility] Start Discharge program: Yes  [facility] nursing team will follow, and will remain available to this patient, the nursing, surgical  and medical teams.   Thanks, Maudie Flakes, MSN, RN, [place], Arther Abbott  Pager# [phone]

## 2017-08-10 NOTE — Progress Notes (Signed)
[place] NOTE   Pharmacy Consult for TPN Indication: prolonged ileus  Patient Measurements: Height: 6' 2"  (188 cm) Weight: 146 lb 9.7 oz (66.5 kg) IBW/kg (Calculated) : 82.2 TPN AdjBW (KG): 73 Body mass index is 18.82 kg/m.   Insulin Requirements: 7 units Novolog yesterday (increased SSI)  Current Nutrition: full liquids started on 4/22  IVF: NS + KCL 63mq/L at 50 mL/hr  Central access: double lumen PAC TPN start date: 07/28/17  Recent Labs    08/09/17 0456 08/10/17 0418  NA 131* 130*  K 4.1 4.0  CL 89* 87*  CO2 29 29  GLUCOSE 172* 133*  BUN 20 23*  CREATININE 0.87 1.01  CALCIUM 9.1 9.2  PHOS  --  4.9*  MG  --  2.2  ALBUMIN 3.0* 3.2*  ALKPHOS 251* 305*  AST 51* 65*  ALT 121* 137*  BILITOT 0.6 0.8    ASSESSMENT                                                                                                          HPI: 89yoM with PMH colorectal cancer s/p chemo/rads and now s/p lower anterior resection with diverting loop ileostomy performed on 4/4. On 4/11 the patient developed post-op ileus and was made NPO. Also treating for potential anastomotic leak seen on CT as small presacral fluid collection. The patient's nutritional status has been poor for quite some time despite being on a diet until recently. Pharmacy consulted to start TPN. Spoke with RD; considering patient high risk for refeeding given prolonged poor PO intake.  Significant events:  - 4/15: aspiration of pelvic collection - 4/16: IV port clotted. TPN infusion d/ced at 1730 and resumed back at ~2200 - 4/20: DC NG tube, beginning clear liquids - 4/21: tolerated small amounts of clear liquids, one episode of vomiting overnight per discussion with RN - 4/22: trial of full liquids - 4/23: CT a/p shows stable fluid collection, likely non-contributory, and unchanged pSBO d/t adhesions - 4/24: continues to have bloating/emesis, although 2.4 L stoma output  yesterday; feels SBP is resolving - 4/25: advancing to regular diet today, patient thinks he can eat better with food from outside hospital  Today:   Glucose (goal <150): CBGs now all well controlled after increasing SSI to moderate scale  Electrolytes: Na, Cl both low and continue to slowly drop; Phos now slightly elevated; Mg and Ca stable wnl  Renal: SCr, BUN rising slightly but sill WNL; bicarb stable WNL  LFTs: Albumin low but improved; LFTs continue to rise; ALT and Alk phos now 2-3x ULN. Tbili remains WNL  TGs:  Stable < 150  Prealbumin: improved to normal (> 18) as of 4/22  NUTRITIONAL GOALS  RD recs: Kcal:  2000-2200 (29-32 kcal/kg) Protein:  80-100 grams/day (1.2-1.4 grams/kg) Fluid:  2.0-2.2 L/day  Clinimix E 5/20 at a goal rate of 75 ml/hr + 20% fat emulsion at 20 ml/hr x 12 hr to provide: 90 g/day protein, 2169 Kcal/day.  - goal:  K > 4.0, Mg > 2.0 with ileus  PLAN      Discussed PO intake and electrolytes with patient. Phos is rising which may necessitate removing electrolytes from TPN. Na and Cl are low, likely d/t high fluid intake with little solid foods. If patient is able to tolerate commercial (i.e. high-sodium) foods, his hyponatremia/chloremia will hopefully correct itself  At 1800 today:  Change TPN to Clinimix E 5/15 at 60 ml/hr - after discussion with RD, will decrease TPN to ~75% of goals d/t rising LFTs and in hopes of stimulating appetite. This will provide 72 g/d protein, and 1502 kcal/day. Would resume original goal regimen tomorrow if patient does not tolerate regular diet well.   Would switch to plain Clinimix if Phos continues to rise; can try oral NaCl tabs if serum levels remain low.  If patient is able to tolerate commercial (i.e. high-sodium) foods, his hyponatremia/-chloremia will hopefully correct itself  Continue 20% fat emulsion at 20 ml/hr  TPN to  contain standard multivitamins and trace elements  IVF per primary service, per RN patient appears euvolemic  Continue moderate SSI with Q8 hr checks  CMP, Phos tomorrow  TPN lab panels on Mondays & Thursdays  F/u daily  Reuel Boom, PharmD, BCPS [phone] 08/10/2017, 9:53 AM

## 2017-08-10 NOTE — Progress Notes (Signed)
21 Days Post-Op   Subjective/Chief Complaint: Ongoing bloating; emesis occasionally. Stoma productive - 2.9L in last 24hrs. Ambulating throughout the day.  Objective: Vital signs in last 24 hours: Temp:  [98 F (36.7 [place])-99.5 F (37.5 Galvan)] 98 F (36.7 Galvan) (04/25 0926) Pulse Rate:  [94-109] 109 (04/25 0926) Resp:  [14-23] 16 (04/25 0926) BP: (94-109)/(67-82) 103/82 (04/25 0926) SpO2:  [99 %-100 %] 100 % (04/25 0619) Weight:  [66.5 kg (146 lb 9.7 oz)] 66.5 kg (146 lb 9.7 oz) (04/25 0601) Last BM Date: 08/10/17  Intake/Output from previous day: 04/24 0701 - 04/25 0700 In: 2885.3 [P.O.:230; I.V.:2655.3] Out: 8101 [Urine:750; BPZWC:5852] Intake/Output this shift: Total I/O In: -  Out: 200 [Stool:200]  General appearance: alert and cooperative Resp: breathing comfortably Cardio: RRR  GI: Abd soft, moderately distended, non tender. Stoma pink, productive; semi liquid stool and gas in appliance  Lab Results:  Recent Labs    08/08/17 1056  WBC 12.6*  HGB 13.8  HCT 40.1  PLT 677*   BMET Recent Labs    08/09/17 0456 08/10/17 0418  NA 131* 130*  K 4.1 4.0  CL 89* 87*  CO2 29 29  GLUCOSE 172* 133*  BUN 20 23*  CREATININE 0.87 1.01  CALCIUM 9.1 9.2   PT/INR No results for input(s): LABPROT, INR in the last 72 hours. ABG No results for input(s): PHART, HCO3 in the last 72 hours.  Invalid input(s): PCO2, PO2  Studies/Results: Ct Abdomen Pelvis W Contrast  Result Date: 08/08/2017 CLINICAL DATA:  Abdominal distention with aspiration of abdominal fluid collection. History of rectal cancer. EXAM: CT ABDOMEN AND PELVIS WITH CONTRAST TECHNIQUE: Multidetector CT imaging of the abdomen and pelvis was performed using the standard protocol following bolus administration of intravenous contrast. CONTRAST:  181mL OMNIPAQUE IOHEXOL 300 MG/ML  SOLN COMPARISON:  07/31/2017 and 07/30/2017 FINDINGS: Lower chest:  No contributory findings. Hepatobiliary: Stable 13 mm mass in the upper  left lobe liver, previously reported as metastasis. No acute finding.No evidence of biliary obstruction or stone. Pancreas: Unremarkable. Spleen: Unremarkable. Adrenals/Urinary Tract: Negative adrenals. No hydronephrosis or stone. Unremarkable bladder. Stomach/Bowel: Status post low anterior resection. Diverting ileostomy. Small bowel obstruction with dilated fluid-filled loops of bowel transitioning to decompressed bowel in the pelvis. The obstruction is partial based on ileostomy output. Pelvic bowel loops are matted and angulated, compatible with adhesion. There is presacral fluid collection measuring 2.6 x 1.9 x 4.8 cm. There is mild colonic wall thickening at the anastomosis that is stable and expected. No appendicitis. Vascular/Lymphatic: No acute vascular abnormality. Stable periaortic lymph nodes as previously highlighted, up to 1 cm in short axis the level of the lower pole left kidney Reproductive:Negative Other: No ascites or pneumoperitoneum. Musculoskeletal: No acute abnormalities. IMPRESSION: 1. 4.8 x 2.6 x 1.9 cm presacral fluid collection that is essentially stable from 07/30/2017. 2. Unchanged partial small bowel obstruction with transition in the pelvis where there is adhesive changes. Electronically Signed   By: Monte Fantasia M.D.   On: 08/08/2017 15:14    Anti-infectives: Anti-infectives (From admission, onward)   Start     Dose/Rate Route Frequency Ordered Stop   07/31/17 1000  piperacillin-tazobactam (ZOSYN) IVPB 3.375 g  Status:  Discontinued     3.375 g 12.5 mL/hr over 240 Minutes Intravenous Every 8 hours 07/31/17 0834 08/03/17 0738   07/27/17 1800  ertapenem (INVANZ) 1 g in sodium chloride 0.9 % 100 mL IVPB  Status:  Discontinued     1 g 200 mL/hr over 30  Minutes Intravenous Every 24 hours 07/27/17 1632 07/31/17 0834   07/20/17 2000  cefoTEtan (CEFOTAN) 2 g in sodium chloride 0.9 % 100 mL IVPB  Status:  Discontinued     2 g 200 mL/hr over 30 Minutes Intravenous Every 12  hours 07/20/17 1346 07/20/17 1415   07/20/17 2000  cefoTEtan in Dextrose 5% (CEFOTAN) IVPB 2 g     2 g Intravenous Every 12 hours 07/20/17 1415 07/20/17 1930   07/20/17 0540  cefoTEtan in Dextrose 5% (CEFOTAN) IVPB 2 g     2 g Intravenous On call to O.R. 07/20/17 0540 07/20/17 0817      Assessment/Plan: s/p Procedure(s): XI ROBOTIC ASSISTED LOWER ANTERIOR RESECTION ERAS PATHWAY (N/A) DIVERTING LOOP COLOSTOMY (N/A) CT A/P showed pSBO with probable transition in the pelvis. Stable presacral fluid collection.  Pt tolerating some full liquids but doesn't like most of it Will try some regular food today.  Pt instructed to stay away from raw fruits and vegetables. Ambulate 5x/day Continue TNA; MIVF PPx: Lov, SCDs   LOS: 21 days   Jacob Galvan. [id number]

## 2017-08-10 NOTE — Consult Note (Signed)
[place] Nurse ostomy follow up Stoma type/location: RUQ ileostomy Stomal assessment/size: 1 and 1/2 inches, red, moist with os at center Peristomal assessment: intact Treatment options for stomal/peristomal skin: skin barrier ring Output: green, thin effluent Ostomy pouching: 1pc.convex ostomy pouch with skin barrier ring Education provided: Patient emptied and then emoved pouch, cleansed skin, prepared new pouch, placed skin barrier ring and applied pouch independently.  He closed tail closure independently. Stand by assist appreciated per patient. Enrolled patient in [facility] Discharge program: Yes   [place] nursing team will follow, and will remain available to this patient, the nursing, surgical and medical teams.  Thanks, Maudie Flakes, MSN, RN, [place], Arther Abbott  Pager# [phone]

## 2017-08-11 LAB — COMPREHENSIVE METABOLIC PANEL
ALBUMIN: 3.2 g/dL — AB (ref 3.5–5.0)
ALT: 164 U/L — ABNORMAL HIGH (ref 17–63)
ANION GAP: 13 (ref 5–15)
AST: 66 U/L — ABNORMAL HIGH (ref 15–41)
Alkaline Phosphatase: 309 U/L — ABNORMAL HIGH (ref 38–126)
BUN: 24 mg/dL — ABNORMAL HIGH (ref 6–20)
CO2: 29 mmol/L (ref 22–32)
Calcium: 9.1 mg/dL (ref 8.9–10.3)
Chloride: 87 mmol/L — ABNORMAL LOW (ref 101–111)
Creatinine, Ser: 0.86 mg/dL (ref 0.61–1.24)
GFR calc non Af Amer: >60 mL/min (ref >60)
GLUCOSE: 166 mg/dL — AB (ref 65–99)
POTASSIUM: 3.8 mmol/L (ref 3.5–5.1)
SODIUM: 129 mmol/L — AB (ref 135–145)
TOTAL PROTEIN: 8 g/dL (ref 6.5–8.1)
Total Bilirubin: 0.8 mg/dL (ref 0.3–1.2)

## 2017-08-11 LAB — GLUCOSE, CAPILLARY
GLUCOSE-CAPILLARY: 125 mg/dL — AB (ref 65–99)
Glucose-Capillary: 124 mg/dL — ABNORMAL HIGH (ref 65–99)
Glucose-Capillary: 115 mg/dL — ABNORMAL HIGH (ref 65–99)

## 2017-08-11 LAB — PHOSPHORUS: Phosphorus: 4.4 mg/dL (ref 2.5–4.6)

## 2017-08-11 MED ORDER — TRACE MINERALS CR-CU-MN-SE-ZN 10-1000-500-60 MCG/ML IV SOLN
INTRAVENOUS | Status: AC
  Administered 2017-08-11: 17:00:00 via INTRAVENOUS
  Filled 2017-08-11: qty 960

## 2017-08-11 MED ORDER — SODIUM CHLORIDE 1 G PO TABS
1.0000 g | ORAL_TABLET | Freq: Two times a day (BID) | ORAL | Status: DC
  Administered 2017-08-11 – 2017-08-14 (×4): 1 g via ORAL
  Filled 2017-08-11 (×7): qty 1

## 2017-08-11 MED ORDER — FAT EMULSION PLANT BASED 20 % IV EMUL
240.0000 mL | INTRAVENOUS | Status: AC
  Administered 2017-08-11: 240 mL via INTRAVENOUS
  Filled 2017-08-11: qty 250

## 2017-08-11 NOTE — Progress Notes (Signed)
PHARMACY - ADULT TOTAL PARENTERAL NUTRITION CONSULT NOTE   Pharmacy Consult for TPN Indication: prolonged ileus  Patient Measurements: Height: 6' 2"  (188 cm) Weight: 146 lb 9.7 oz (66.5 kg) IBW/kg (Calculated) : 82.2 TPN AdjBW (KG): 73 Body mass index is 18.82 kg/m.   Insulin Requirements: 8 units Novolog yesterday (increased SSI)  Current Nutrition: full liquids started on 4/22  IVF: NS + KCL 63mq/L at 50 mL/hr  Central access: double lumen PAC TPN start date: 07/28/17  Recent Labs    08/10/17 0418 08/11/17 0342  NA 130* 129*  K 4.0 3.8  CL 87* 87*  CO2 29 29  GLUCOSE 133* 166*  BUN 23* 24*  CREATININE 1.01 0.86  CALCIUM 9.2 9.1  PHOS 4.9* 4.4  MG 2.2  --   ALBUMIN 3.2* 3.2*  ALKPHOS 305* 309*  AST 65* 66*  ALT 137* 164*  BILITOT 0.8 0.8    ASSESSMENT                                                                                                          HPI: 89yoM with PMH colorectal cancer s/p chemo/rads and now s/p lower anterior resection with diverting loop ileostomy performed on 4/4. On 4/11 the patient developed post-op ileus and was made NPO. Also treating for potential anastomotic leak seen on CT as small presacral fluid collection. The patient's nutritional status has been poor for quite some time despite being on a diet until recently. Pharmacy consulted to start TPN. Spoke with RD; considering patient high risk for refeeding given prolonged poor PO intake.  Significant events:  - 4/15: aspiration of pelvic collection - 4/16: IV port clotted. TPN infusion d/ced at 1730 and resumed back at ~2200 - 4/20: DC NG tube, beginning clear liquids - 4/21: tolerated small amounts of clear liquids, one episode of vomiting overnight per discussion with RN - 4/22: trial of full liquids - 4/23: CT a/p shows stable fluid collection, likely non-contributory, and unchanged pSBO d/t adhesions - 4/24: continues to have bloating/emesis, although 2.4 L stoma output  yesterday; feels SBP is resolving - 4/25: advancing to regular diet today, patient thinks he can eat better with food from outside hospital  Today:   Glucose (goal <150): range 124-181  Electrolytes: Na, Cl both low and continue to slowly drop; Phos now WNL   Renal: SCr WNL, BUN rising slightly ; bicarb stable WNL  LFTs: Albumin low but improved; LFTs continue to rise; ALT and Alk phos now 2-3x ULN. Tbili remains WNL  TGs:  Stable < 150  Prealbumin: improved to normal (> 18) as of 4/22  NUTRITIONAL GOALS  RD recs: Kcal:  2000-2200 (29-32 kcal/kg) Protein:  80-100 grams/day (1.2-1.4 grams/kg) Fluid:  2.0-2.2 L/day  Clinimix E 5/20 at a goal rate of 75 ml/hr + 20% fat emulsion at 20 ml/hr x 12 hr to provide: 90 g/day protein, 2169 Kcal/day.  - goal:  K > 4.0, Mg > 2.0 with ileus  PLAN      Per MD, decrease TPN to 1/2 rate Initiate Na tabs 1gm po twice daily  At 1800 today:  Decrease  Clinimix E 5/15 to 40 ml/hr    Continue 20% fat emulsion at 20 ml/hr  TPN to contain standard multivitamins and trace elements   If patient is able to tolerate commercial (i.e. high-sodium) foods, his hyponatremia/-chloremia will hopefully correct itself  IVF per primary service,  Continue moderate SSI with Q8 hr checks  BMet and phos tomorrow  TPN lab panels on Mondays & Thursdays  F/u daily  Dolly Rias RPh 08/11/2017, 10:04 AM Pager [phone]

## 2017-08-11 NOTE — Progress Notes (Signed)
22 Days Post-Op   Subjective/Chief Complaint: Some ongoing bloating; no emesis yesterday. Stoma productive - 954ml in last 24hrs. Ambulating throughout the day.  Objective: Vital signs in last 24 hours: Temp:  [98 F (36.7 C)-98.7 F (37.1 C)] 98.1 F (36.7 C) (04/26 0550) Pulse Rate:  [89-109] 89 (04/26 0550) Resp:  [16-19] 18 (04/26 0550) BP: (90-109)/(63-82) 90/63 (04/26 0550) SpO2:  [98 %-100 %] 100 % (04/26 0550) FiO2 (%):  [34 %] 34 % (04/25 1200) Last BM Date: 08/10/17  Intake/Output from previous day: 04/25 0701 - 04/26 0700 In: 1280 [P.O.:240; I.V.:1040] Out: 1300 [Urine:400; Stool:900] Intake/Output this shift: No intake/output data recorded.  General appearance: alert and cooperative Resp: breathing comfortably Cardio: RRR  GI: Abd soft, moderately distended, non tender. Stoma pink, productive; semi liquid stool and gas in appliance  Lab Results:  Recent Labs    08/08/17 1056  WBC 12.6*  HGB 13.8  HCT 40.1  PLT 677*   BMET Recent Labs    08/10/17 0418 08/11/17 0342  NA 130* 129*  K 4.0 3.8  CL 87* 87*  CO2 29 29  GLUCOSE 133* 166*  BUN 23* 24*  CREATININE 1.01 0.86  CALCIUM 9.2 9.1   PT/INR No results for input(s): LABPROT, INR in the last 72 hours. ABG No results for input(s): PHART, HCO3 in the last 72 hours.  Invalid input(s): PCO2, PO2  Studies/Results: No results found.  Anti-infectives: Anti-infectives (From admission, onward)   Start     Dose/Rate Route Frequency Ordered Stop   07/31/17 1000  piperacillin-tazobactam (ZOSYN) IVPB 3.375 g  Status:  Discontinued     3.375 g 12.5 mL/hr over 240 Minutes Intravenous Every 8 hours 07/31/17 0834 08/03/17 0738   07/27/17 1800  ertapenem (INVANZ) 1 g in sodium chloride 0.9 % 100 mL IVPB  Status:  Discontinued     1 g 200 mL/hr over 30 Minutes Intravenous Every 24 hours 07/27/17 1632 07/31/17 0834   07/20/17 2000  cefoTEtan (CEFOTAN) 2 g in sodium chloride 0.9 % 100 mL IVPB  Status:   Discontinued     2 g 200 mL/hr over 30 Minutes Intravenous Every 12 hours 07/20/17 1346 07/20/17 1415   07/20/17 2000  cefoTEtan in Dextrose 5% (CEFOTAN) IVPB 2 g     2 g Intravenous Every 12 hours 07/20/17 1415 07/20/17 1930   07/20/17 0540  cefoTEtan in Dextrose 5% (CEFOTAN) IVPB 2 g     2 g Intravenous On call to O.R. 07/20/17 0540 07/20/17 0817      Assessment/Plan: s/p Procedure(s): XI ROBOTIC ASSISTED LOWER ANTERIOR RESECTION ERAS PATHWAY (N/A) DIVERTING LOOP COLOSTOMY (N/A) CT A/P showed pSBO with probable transition in the pelvis. Stable presacral fluid collection.  Pt tolerating some solid foods. Will  Cont to try some regular food today.  Pt instructed to stay away from raw fruits and vegetables.  Wean TPN to half rate Ambulate 5x/day Hyponatremia: will add salt tabs per pharmacy Continue TNA; MIVF PPx: Lov, SCDs   LOS: 22 days   Estella Malatesta C. 2/45/8099

## 2017-08-12 LAB — GLUCOSE, CAPILLARY
GLUCOSE-CAPILLARY: 121 mg/dL — AB (ref 65–99)
GLUCOSE-CAPILLARY: 111 mg/dL — AB (ref 65–99)
Glucose-Capillary: 146 mg/dL — ABNORMAL HIGH (ref 65–99)

## 2017-08-12 LAB — BASIC METABOLIC PANEL
Anion gap: 15 (ref 5–15)
BUN: 25 mg/dL — AB (ref 6–20)
CHLORIDE: 87 mmol/L — AB (ref 101–111)
CO2: 28 mmol/L (ref 22–32)
CREATININE: 0.96 mg/dL (ref 0.61–1.24)
Calcium: 9.1 mg/dL (ref 8.9–10.3)
GFR calc Af Amer: >60 mL/min (ref >60)
GFR calc non Af Amer: >60 mL/min (ref >60)
GLUCOSE: 121 mg/dL — AB (ref 65–99)
Potassium: 4 mmol/L (ref 3.5–5.1)
SODIUM: 130 mmol/L — AB (ref 135–145)

## 2017-08-12 LAB — PHOSPHORUS: PHOSPHORUS: 4.6 mg/dL (ref 2.5–4.6)

## 2017-08-12 MED ORDER — FAT EMULSION PLANT BASED 20 % IV EMUL
240.0000 mL | INTRAVENOUS | Status: DC
  Administered 2017-08-12: 240 mL via INTRAVENOUS
  Filled 2017-08-12: qty 250

## 2017-08-12 MED ORDER — TRACE MINERALS CR-CU-MN-SE-ZN 10-1000-500-60 MCG/ML IV SOLN
INTRAVENOUS | Status: DC
  Administered 2017-08-12: 17:00:00 via INTRAVENOUS
  Filled 2017-08-12: qty 960

## 2017-08-12 NOTE — Progress Notes (Addendum)
Discharge Planning:  PCP appt needed, MATCH home/mother, Rob LAR w/ileostomy, +ostomy OP, IVF, FLD, CT showed ileus with presacral fluid collection but no signs of leak, PICC-TPN, Dilaudid PCA, IV Zofran, Ongoing bloating; emesis x1  Jonnie Finner RN CCM Case Mgmt phone [phone]

## 2017-08-12 NOTE — Progress Notes (Signed)
Patient ID: Jacob Amass., male   DOB: September 17, 1975, 42 y.o.   MRN: [id number] 23 Days Post-Op   Subjective: Still feels bloated and occasional crampy abdominal pain unchanged.  He was able to eat some solid food last night which is progress.  Ileostomy functioning.  Objective: Vital signs in last 24 hours: Temp:  [98.1 F (36.7 C)-99.1 F (37.3 C)] 98.4 F (36.9 C) (04/27 0539) Pulse Rate:  [99-115] 115 (04/27 0539) Resp:  [11-17] 13 (04/27 0728) BP: (90-105)/(66-78) 101/68 (04/27 0539) SpO2:  [93 %-100 %] 93 % (04/27 0728) FiO2 (%):  [34 %] 34 % (04/26 1602) Last BM Date: 08/12/17  Intake/Output from previous day: 04/26 0701 - 04/27 0700 In: 1652.2 [P.O.:400; I.V.:1252.2] Out: 2540 [Urine:850; YJEHU:3149] Intake/Output this shift: Total I/O In: -  Out: 350 [Stool:350]  General appearance: alert, cooperative and no distress GI: Mild distention.  Soft and nontender.  Ileostomy healthy.  Lab Results:  No results for input(s): WBC, HGB, HCT, PLT in the last 72 hours. BMET Recent Labs    08/11/17 0342 08/12/17 0505  NA 129* 130*  K 3.8 4.0  CL 87* 87*  CO2 29 28  GLUCOSE 166* 121*  BUN 24* 25*  CREATININE 0.86 0.96  CALCIUM 9.1 9.1     Studies/Results: No results found.  Anti-infectives: Anti-infectives (From admission, onward)   Start     Dose/Rate Route Frequency Ordered Stop   07/31/17 1000  piperacillin-tazobactam (ZOSYN) IVPB 3.375 g  Status:  Discontinued     3.375 g 12.5 mL/hr over 240 Minutes Intravenous Every 8 hours 07/31/17 0834 08/03/17 0738   07/27/17 1800  ertapenem (INVANZ) 1 g in sodium chloride 0.9 % 100 mL IVPB  Status:  Discontinued     1 g 200 mL/hr over 30 Minutes Intravenous Every 24 hours 07/27/17 1632 07/31/17 0834   07/20/17 2000  cefoTEtan (CEFOTAN) 2 g in sodium chloride 0.9 % 100 mL IVPB  Status:  Discontinued     2 g 200 mL/hr over 30 Minutes Intravenous Every 12 hours 07/20/17 1346 07/20/17 1415   07/20/17 2000  cefoTEtan in  Dextrose 5% (CEFOTAN) IVPB 2 g     2 g Intravenous Every 12 hours 07/20/17 1415 07/20/17 1930   07/20/17 0540  cefoTEtan in Dextrose 5% (CEFOTAN) IVPB 2 g     2 g Intravenous On call to O.R. 07/20/17 0540 07/20/17 0817      Assessment/Plan: s/p Procedure(s): XI ROBOTIC ASSISTED LOWER ANTERIOR RESECTION ERAS PATHWAY DIVERTING LOOP COLOSTOMY Partial SBO versus prolonged ileus.  Trying to gradually transition from TNA to diet.  Monitor solid food intake today and consider stopping TNA tomorrow if he continues to eat a little better.  No significant change.   LOS: 23 days    [name] 08/12/2017

## 2017-08-12 NOTE — Progress Notes (Signed)
PHARMACY - ADULT TOTAL PARENTERAL NUTRITION CONSULT NOTE   Pharmacy Consult for TPN Indication: prolonged ileus  Patient Measurements: Height: 6' 2" (188 cm) Weight: 146 lb 9.7 oz (66.5 kg) IBW/kg (Calculated) : 82.2 TPN AdjBW (KG): 73 Body mass index is 18.82 kg/m.   Insulin Requirements: 2 units Novolog yesterday   Current Nutrition: regular diet started on 4/25  IVF: NS + KCL 20mEq/L at 50 mL/hr  Central access: double lumen PAC TPN start date: 07/28/17  Recent Labs    08/10/17 0418 08/11/17 0342 08/12/17 0505  NA 130* 129* 130*  K 4.0 3.8 4.0  CL 87* 87* 87*  CO2 29 29 28  GLUCOSE 133* 166* 121*  BUN 23* 24* 25*  CREATININE 1.01 0.86 0.96  CALCIUM 9.2 9.1 9.1  PHOS 4.9* 4.4 4.6  MG 2.2  --   --   ALBUMIN 3.2* 3.2*  --   ALKPHOS 305* 309*  --   AST 65* 66*  --   ALT 137* 164*  --   BILITOT 0.8 0.8  --     ASSESSMENT                                                                                                          HPI: 41 yoM with PMH colorectal cancer s/p chemo/rads and now s/p lower anterior resection with diverting loop ileostomy performed on 4/4. On 4/11 the patient developed post-op ileus and was made NPO. Also treating for potential anastomotic leak seen on CT as small presacral fluid collection. The patient's nutritional status has been poor for quite some time despite being on a diet until recently. Pharmacy consulted to start TPN. Spoke with RD; considering patient high risk for refeeding given prolonged poor PO intake.  Significant events:  - 4/15: aspiration of pelvic collection - 4/16: IV port clotted. TPN infusion d/ced at 1730 and resumed back at ~2200 - 4/20: DC NG tube, beginning clear liquids - 4/21: tolerated small amounts of clear liquids, one episode of vomiting overnight per discussion with RN - 4/22: trial of full liquids - 4/23: CT a/p shows stable fluid collection, likely non-contributory, and unchanged pSBO d/t adhesions - 4/24:  continues to have bloating/emesis, although 2.4 L stoma output yesterday; feels SBP is resolving - 4/25: advancing to regular diet today, patient thinks he can eat better with food from outside hospital - 4/26:was able to eat solid food but still feels bloated and occasional crampy abd pain  Today:   Glucose  at goal of <150  Electrolytes: Na, Cl both low (salt tabs started 4/26); all others WNL  Renal: SCr WNL, BUN rising slightly ; bicarb stable WNL  LFTs: Albumin low but improved; LFTs continue to rise; ALT and Alk phos now 2-3x ULN. Tbili remains WNL  TGs:  Stable < 150  Prealbumin: improved to normal (> 18) as of 4/22  NUTRITIONAL GOALS                                                                                               RD recs: Kcal:  2000-2200 (29-32 kcal/kg) Protein:  80-100 grams/day (1.2-1.4 grams/kg) Fluid:  2.0-2.2 L/day  Clinimix E 5/20 at a goal rate of 75 ml/hr + 20% fat emulsion at 20 ml/hr x 12 hr to provide: 90 g/day protein, 2169 Kcal/day.  - goal:  K > 4.0, Mg > 2.0 with ileus  PLAN        At 1800 today:  continue  Clinimix E 5/15 at 40 ml/hr    Continue 20% fat emulsion at 20 ml/hr  TPN to contain standard multivitamins and trace elements   If patient is able to tolerate commercial (i.e. high-sodium) foods, his hyponatremia/-chloremia will hopefully correct itself, started on NaCl 1gm tabs po bid 4/26  IVF per primary service,  Continue moderate SSI with Q8 hr checks  TPN lab panels on Mondays & Thursdays  F/u daily  Ellen  RPh 08/12/2017, 10:37 AM Pager [phone]   

## 2017-08-13 LAB — GLUCOSE, CAPILLARY: GLUCOSE-CAPILLARY: 149 mg/dL — AB (ref 65–99)

## 2017-08-13 MED ORDER — DEXTROSE 10 % IV SOLN
INTRAVENOUS | Status: AC
  Administered 2017-08-13: 08:00:00 via INTRAVENOUS
  Filled 2017-08-13: qty 1000

## 2017-08-13 NOTE — Progress Notes (Addendum)
[facility] NOTE   Pharmacy Consult for TPN Indication: prolonged ileus  Patient Measurements: Height: 6' 2"  (188 cm) Weight: 146 lb 9.7 oz (66.5 kg) IBW/kg (Calculated) : 82.2 TPN AdjBW (KG): 73 Body mass index is 18.82 kg/m.   Insulin Requirements: 2 units Novolog yesterday   Current Nutrition: regular diet started on 4/25  IVF: NS + KCL 95mq/L at 50 mL/hr  Central access: double lumen PAC TPN start date: 07/28/17  Recent Labs    08/11/17 0342 08/12/17 0505  NA 129* 130*  K 3.8 4.0  CL 87* 87*  CO2 29 28  GLUCOSE 166* 121*  BUN 24* 25*  CREATININE 0.86 0.96  CALCIUM 9.1 9.1  PHOS 4.4 4.6  ALBUMIN 3.2*  --   ALKPHOS 309*  --   AST 66*  --   ALT 164*  --   BILITOT 0.8  --     ASSESSMENT                                                                                                          HPI: 89yoM with PMH colorectal cancer s/p chemo/rads and now s/p lower anterior resection with diverting loop ileostomy performed on 4/4. On 4/11 the patient developed post-op ileus and was made NPO. Also treating for potential anastomotic leak seen on CT as small presacral fluid collection. The patient's nutritional status has been poor for quite some time despite being on a diet until recently. Pharmacy consulted to start TPN. Spoke with RD; considering patient high risk for refeeding given prolonged poor PO intake.  Significant events:  - 4/15: aspiration of pelvic collection - 4/16: IV port clotted. TPN infusion d/ced at 1730 and resumed back at ~2200 - 4/20: DC NG tube, beginning clear liquids - 4/21: tolerated small amounts of clear liquids, one episode of vomiting overnight per discussion with RN - 4/22: trial of full liquids - 4/23: CT a/p shows stable fluid collection, likely non-contributory, and unchanged pSBO d/t adhesions - 4/24: continues to have bloating/emesis, although 2.4 L stoma output yesterday; feels SBP is  resolving - 4/25: advancing to regular diet today, patient thinks he can eat better with food from outside hospital - 4/26:was able to eat solid food but still feels bloated and occasional crampy abd pain  Today:  LAST labs 4/27  Glucose  at goal of <150  Electrolytes: Na, Cl both low (salt tabs started 4/26); all others WNL  Renal: SCr WNL, BUN rising slightly ; bicarb stable WNL  LFTs: Albumin low but improved; LFTs continue to rise; ALT and Alk phos now 2-3x ULN. Tbili remains WNL  TGs:  Stable < 150  Prealbumin: improved to normal (> 18) as of 4/22  NUTRITIONAL GOALS  RD recs: Kcal:  2000-2200 (29-32 kcal/kg) Protein:  80-100 grams/day (1.2-1.4 grams/kg) Fluid:  2.0-2.2 L/day  Clinimix E 5/20 at a goal rate of 75 ml/hr + 20% fat emulsion at 20 ml/hr x 12 hr to provide: 90 g/day protein, 2169 Kcal/day.  - goal:  K > 4.0, Mg > 2.0 with ileus  PLAN        TPN stopped due to air in line this am, D10 started.  Per surgery pt is eating will discontinue TPN.  D10 to stop at 1400   d/c TPN labs   f/u need for continued salt tabs with regular diet, pt has refused most doses  BMP for tomorrow am  IVF per primary service,  D/c SSI  Q8 hr checks, no hx of diabetes    Dolly Rias RPh 08/13/2017, 9:00 AM Pager [phone]

## 2017-08-13 NOTE — Progress Notes (Signed)
Patient ID: Jacob Galvan., male   DOB: 07-Feb-1976, 42 y.o.   MRN: [id number] 24 Days Post-Op   Subjective: Still with abdominal bloating and cramping unchanged.  However eating somewhat better.  He had pizza and a chicken sandwich yesterday.  TNA has been held due to air in the line.  Good ostomy output.  Objective: Vital signs in last 24 hours: Temp:  [98.1 F (36.7 C)-98.6 F (37 C)] 98.1 F (36.7 C) (04/28 0535) Pulse Rate:  [93-102] 93 (04/28 0535) Resp:  [10-19] 16 (04/28 0535) BP: (96-104)/(71-74) 96/74 (04/28 0535) SpO2:  [74 %-100 %] 100 % (04/28 0535) Last BM Date: 08/12/17  Intake/Output from previous day: 04/27 0701 - 04/28 0700 In: 600 [I.V.:600] Out: 2800 [Urine:1050; Stool:1750] Intake/Output this shift: Total I/O In: -  Out: 150 [Urine:150]  General appearance: alert, cooperative and no distress GI: Moderate distention.  Nontender.  Healthy ileostomy with stool in the bag.  Lab Results:  No results for input(s): WBC, HGB, HCT, PLT in the last 72 hours. BMET Recent Labs    08/11/17 0342 08/12/17 0505  NA 129* 130*  K 3.8 4.0  CL 87* 87*  CO2 29 28  GLUCOSE 166* 121*  BUN 24* 25*  CREATININE 0.86 0.96  CALCIUM 9.1 9.1     Studies/Results: No results found.  Anti-infectives: Anti-infectives (From admission, onward)   Start     Dose/Rate Route Frequency Ordered Stop   07/31/17 1000  piperacillin-tazobactam (ZOSYN) IVPB 3.375 g  Status:  Discontinued     3.375 g 12.5 mL/hr over 240 Minutes Intravenous Every 8 hours 07/31/17 0834 08/03/17 0738   07/27/17 1800  ertapenem (INVANZ) 1 g in sodium chloride 0.9 % 100 mL IVPB  Status:  Discontinued     1 g 200 mL/hr over 30 Minutes Intravenous Every 24 hours 07/27/17 1632 07/31/17 0834   07/20/17 2000  cefoTEtan (CEFOTAN) 2 g in sodium chloride 0.9 % 100 mL IVPB  Status:  Discontinued     2 g 200 mL/hr over 30 Minutes Intravenous Every 12 hours 07/20/17 1346 07/20/17 1415   07/20/17 2000  cefoTEtan in  Dextrose 5% (CEFOTAN) IVPB 2 g     2 g Intravenous Every 12 hours 07/20/17 1415 07/20/17 1930   07/20/17 0540  cefoTEtan in Dextrose 5% (CEFOTAN) IVPB 2 g     2 g Intravenous On call to O.R. 07/20/17 0540 07/20/17 0817      Assessment/Plan: s/p Procedure(s): XI ROBOTIC ASSISTED LOWER ANTERIOR RESECTION ERAS PATHWAY DIVERTING LOOP COLOSTOMY Prolonged postoperative ileus versus partial small bowel obstruction.  Still with distention and cramping but tolerating diet better and good ostomy output.  TNA stopped due to air in the line and I will discontinue this as he is eating better.  We will let Dr. Marcello Moores decide if any further follow-up imaging is indicated.  He seems stable to slightly improved.   LOS: 24 days    [name] 08/13/2017

## 2017-08-13 NOTE — Progress Notes (Signed)
Pt's IV pump has been occluding so I flushed the port and changed the pump. Pharmacy notified because IV tubing probable defect. Pt requested that I cap the port and disconnect the line. The Pharmacist recommended that the nurse hand D10.

## 2017-08-14 ENCOUNTER — Telehealth: Payer: Self-pay | Admitting: Oncology

## 2017-08-14 LAB — BASIC METABOLIC PANEL
Anion gap: 14 (ref 5–15)
BUN: 28 mg/dL — ABNORMAL HIGH (ref 6–20)
CHLORIDE: 85 mmol/L — AB (ref 101–111)
CO2: 29 mmol/L (ref 22–32)
CREATININE: 0.99 mg/dL (ref 0.61–1.24)
Calcium: 9.3 mg/dL (ref 8.9–10.3)
GFR calc Af Amer: >60 mL/min (ref >60)
GFR calc non Af Amer: >60 mL/min (ref >60)
Glucose, Bld: 117 mg/dL — ABNORMAL HIGH (ref 65–99)
POTASSIUM: 3.6 mmol/L (ref 3.5–5.1)
SODIUM: 128 mmol/L — AB (ref 135–145)

## 2017-08-14 MED ORDER — GABAPENTIN 100 MG PO CAPS
200.0000 mg | ORAL_CAPSULE | Freq: Three times a day (TID) | ORAL | Status: DC
  Administered 2017-08-14 – 2017-08-29 (×46): 200 mg via ORAL
  Filled 2017-08-14 (×47): qty 2

## 2017-08-14 MED ORDER — SODIUM CHLORIDE 1 G PO TABS
1.0000 g | ORAL_TABLET | Freq: Three times a day (TID) | ORAL | Status: DC
  Administered 2017-08-15: 1 g via ORAL
  Filled 2017-08-14 (×7): qty 1

## 2017-08-14 NOTE — Progress Notes (Signed)
25 Days Post-Op   Subjective/Chief Complaint: Ongoing bloating; no further emesis. Stoma productive - 2.3L in last 24hrs. Ambulating throughout the day.  Tolerating reg diet.  TPN is off  Objective: Vital signs in last 24 hours: Temp:  [98.2 F (36.8 Galvan)-98.7 F (37.1 Galvan)] 98.2 F (36.8 Galvan) (04/29 0529) Pulse Rate:  [82-94] 94 (04/29 0529) Resp:  [9-18] 16 (04/29 0822) BP: (87-102)/(66-75) 87/72 (04/29 0529) SpO2:  [98 %-100 %] 100 % (04/29 0822) FiO2 (%):  [34 %] 34 % (04/29 0822) Last BM Date: 08/13/17  Intake/Output from previous day: 04/28 0701 - 04/29 0700 In: 1721.3 [P.O.:300; I.V.:1421.3] Out: 3350 [Urine:1100; Stool:2250] Intake/Output this shift: Total I/O In: -  Out: 350 [Urine:150; Stool:200]  General appearance: alert and cooperative Resp: breathing comfortably Cardio: RRR  GI: Abd soft, moderately distended, non tender. Stoma pink, productive; semi liquid stool and gas in appliance  Lab Results:  No results for input(s): WBC, HGB, HCT, PLT in the last 72 hours. BMET Recent Labs    08/12/17 0505 08/14/17 0320  NA 130* 128*  K 4.0 3.6  CL 87* 85*  CO2 28 29  GLUCOSE 121* 117*  BUN 25* 28*  CREATININE 0.96 0.99  CALCIUM 9.1 9.3   PT/INR No results for input(s): LABPROT, INR in the last 72 hours. ABG No results for input(s): PHART, HCO3 in the last 72 hours.  Invalid input(s): PCO2, PO2  Studies/Results: No results found.  Anti-infectives: Anti-infectives (From admission, onward)   Start     Dose/Rate Route Frequency Ordered Stop   07/31/17 1000  piperacillin-tazobactam (ZOSYN) IVPB 3.375 g  Status:  Discontinued     3.375 g 12.5 mL/hr over 240 Minutes Intravenous Every 8 hours 07/31/17 0834 08/03/17 0738   07/27/17 1800  ertapenem (INVANZ) 1 g in sodium chloride 0.9 % 100 mL IVPB  Status:  Discontinued     1 g 200 mL/hr over 30 Minutes Intravenous Every 24 hours 07/27/17 1632 07/31/17 0834   07/20/17 2000  cefoTEtan (CEFOTAN) 2 g in sodium  chloride 0.9 % 100 mL IVPB  Status:  Discontinued     2 g 200 mL/hr over 30 Minutes Intravenous Every 12 hours 07/20/17 1346 07/20/17 1415   07/20/17 2000  cefoTEtan in Dextrose 5% (CEFOTAN) IVPB 2 g     2 g Intravenous Every 12 hours 07/20/17 1415 07/20/17 1930   07/20/17 0540  cefoTEtan in Dextrose 5% (CEFOTAN) IVPB 2 g     2 g Intravenous On call to O.R. 07/20/17 0540 07/20/17 0817      Assessment/Plan: s/p Procedure(s): XI ROBOTIC ASSISTED LOWER ANTERIOR RESECTION ERAS PATHWAY (N/A) DIVERTING LOOP COLOSTOMY (N/A) Cont regular food today.  Pt instructed to stay away from raw fruits and vegetables. Ambulate 5x/day decrease MIVF Try PO pain meds Neurontin for foot neuropathy Hyponatremia: NaCL tabs increased to TID PPx: Lov, SCDs   LOS: 25 days   Jacob Galvan. [id number]/[id number]

## 2017-08-14 NOTE — Progress Notes (Signed)
Nutrition Follow-up  DOCUMENTATION CODES:   Severe malnutrition in context of chronic illness  INTERVENTION:   Will monitor for further nutritional needs  NUTRITION DIAGNOSIS:   Severe Malnutrition related to chronic illness, cancer and cancer related treatments as evidenced by moderate fat depletion, severe fat depletion, moderate muscle depletion, severe muscle depletion.  Ongoing.  GOAL:   Patient will meet greater than or equal to 90% of their needs  Progressing.  MONITOR:   Labs, I & O's, Skin, Weight trends, PO intake  ASSESSMENT:   42 year old male with PMH significant for stage II colorectal cancer s/p chemo and radiation.  4/4- s/p robotic assisted lower anterior resection with splenic flexure mobilization and diverting loop ileostomy 4/11- CT shows ileus with small presacral fluid collection 4/12 - NPO,TPN 4/20- clear liquids 4/22- full liquids 4/29 - TPN d/c, diet advanced to soft diet  Pt currently consuming 25% of breakfast. Ate pizza and chicken sandwiches on 4/28.   Medications: Metamucil packet daily  Labs reviewed: Low Na  Diet Order:  Diet regular Room service appropriate? Yes; Fluid consistency: Thin  EDUCATION NEEDS:   No education needs have been identified at this time  Skin:  Skin Assessment: Skin Integrity Issues: Skin Integrity Issues:: Incisions Incisions: surgical incision to abdomen  Last BM:  4/22- 225 ml via ostomy   Height:   Ht Readings from Last 1 Encounters:  07/20/17 6\' 2"  (1.88 m)    Weight:   Wt Readings from Last 1 Encounters:  08/10/17 146 lb 9.7 oz (66.5 kg)    Ideal Body Weight:  86.4 kg  BMI:  Body mass index is 18.82 kg/m.  Estimated Nutritional Needs:   Kcal:  2000-2200 (29-32 kcal/kg)  Protein:  80-100 grams/day (1.2-1.4 grams/kg)  Fluid:  2.0-2.2 L/day  Clayton Bibles, MS, RD, LDN [facility] Pager: [phone] After Hours Pager: [phone]

## 2017-08-14 NOTE — Telephone Encounter (Signed)
R/s appt per 4/29 sch message - unable to leave message for pt . F/u visit should show on discharge papers when patient leaves hospital .

## 2017-08-14 NOTE — Consult Note (Addendum)
[place] Nurse ostomy follow up Pt states he changed ostomy pouch without assistance on Sat.  He also states he is independent with emptying.  Reviewed pouching routines and ordering supplies.  There are 7 sets of pouches and barrier rings to take home in the room.  Indigent plan information for supplies discussed with patient.  He denies further questions or need for assistance at this time and is hopefull that he will discharge tomorrow. Julien Girt MSN, RN, [place], [place], [place]

## 2017-08-15 MED ORDER — HEPARIN SOD (PORK) LOCK FLUSH 100 UNIT/ML IV SOLN
500.0000 [IU] | INTRAVENOUS | Status: AC | PRN
  Administered 2017-08-15: 500 [IU]

## 2017-08-15 MED ORDER — SIMETHICONE 40 MG/0.6ML PO SUSP
80.0000 mg | Freq: Three times a day (TID) | ORAL | Status: DC
  Administered 2017-08-15 – 2017-08-29 (×41): 80 mg via ORAL
  Filled 2017-08-15 (×42): qty 1.2

## 2017-08-15 MED ORDER — HYDROMORPHONE HCL 1 MG/ML IJ SOLN
0.5000 mg | INTRAMUSCULAR | Status: DC | PRN
  Administered 2017-08-15 – 2017-08-29 (×60): 0.5 mg via INTRAVENOUS
  Filled 2017-08-15 (×64): qty 0.5

## 2017-08-15 NOTE — Progress Notes (Signed)
Patient emptied 773mls brown liquid from ostomy bag. Instructed patient to empty bag when 1/3 to 1/2 full at 0800 this AM. Donne Hazel, RN

## 2017-08-15 NOTE — Progress Notes (Signed)
26 Days Post-Op   Subjective/Chief Complaint: Bloating better; no further emesis. Stoma productive - 1.7L in last 24hrs. Ambulating throughout the day.  Tolerating reg diet and eating more and more.  TPN is off  Objective: Vital signs in last 24 hours: Temp:  [98 F (36.7 C)-98.6 F (37 C)] 98.1 F (36.7 C) (04/30 0553) Pulse Rate:  [92-123] 102 (04/30 0553) Resp:  [12-18] 16 (04/30 0802) BP: (92-100)/(69-79) 92/74 (04/30 0553) SpO2:  [99 %-100 %] 100 % (04/30 0802) FiO2 (%):  [34 %-40 %] 40 % (04/29 1608) Last BM Date: 08/14/17  Intake/Output from previous day: 04/29 0701 - 04/30 0700 In: 1373.5 [P.O.:300; I.V.:1073.5] Out: 2500 [Urine:825; DUKGU:5427] Intake/Output this shift: No intake/output data recorded.  General appearance: alert and cooperative Resp: breathing comfortably Cardio: RRR  GI: Abd soft, mildly distended, non tender. Stoma pink, productive; semi liquid stool and gas in appliance  Lab Results:  No results for input(s): WBC, HGB, HCT, PLT in the last 72 hours. BMET Recent Labs    08/14/17 0320  NA 128*  K 3.6  CL 85*  CO2 29  GLUCOSE 117*  BUN 28*  CREATININE 0.99  CALCIUM 9.3   PT/INR No results for input(s): LABPROT, INR in the last 72 hours. ABG No results for input(s): PHART, HCO3 in the last 72 hours.  Invalid input(s): PCO2, PO2  Studies/Results: No results found.  Anti-infectives: Anti-infectives (From admission, onward)   Start     Dose/Rate Route Frequency Ordered Stop   07/31/17 1000  piperacillin-tazobactam (ZOSYN) IVPB 3.375 g  Status:  Discontinued     3.375 g 12.5 mL/hr over 240 Minutes Intravenous Every 8 hours 07/31/17 0834 08/03/17 0738   07/27/17 1800  ertapenem (INVANZ) 1 g in sodium chloride 0.9 % 100 mL IVPB  Status:  Discontinued     1 g 200 mL/hr over 30 Minutes Intravenous Every 24 hours 07/27/17 1632 07/31/17 0834   07/20/17 2000  cefoTEtan (CEFOTAN) 2 g in sodium chloride 0.9 % 100 mL IVPB  Status:   Discontinued     2 g 200 mL/hr over 30 Minutes Intravenous Every 12 hours 07/20/17 1346 07/20/17 1415   07/20/17 2000  cefoTEtan in Dextrose 5% (CEFOTAN) IVPB 2 g     2 g Intravenous Every 12 hours 07/20/17 1415 07/20/17 1930   07/20/17 0540  cefoTEtan in Dextrose 5% (CEFOTAN) IVPB 2 g     2 g Intravenous On call to O.R. 07/20/17 0540 07/20/17 0817      Assessment/Plan: s/p Procedure(s): XI ROBOTIC ASSISTED LOWER ANTERIOR RESECTION ERAS PATHWAY (N/A) DIVERTING LOOP COLOSTOMY (N/A) Cont regular food today.  Pt instructed to stay away from raw fruits and vegetables. Ambulate 5x/day SL MIVF Cont PO pain meds, d/c pca Neurontin for foot neuropathy Hyponatremia: NaCL tabs increased to TID PPx: Lov, SCDs   LOS: 26 days   Sherlonda Flater C. 0/62/3762

## 2017-08-16 ENCOUNTER — Inpatient Hospital Stay: Payer: Medicaid Other | Admitting: Oncology

## 2017-08-16 LAB — BASIC METABOLIC PANEL
ANION GAP: 16 — AB (ref 5–15)
ANION GAP: 11 (ref 5–15)
BUN: 41 mg/dL — ABNORMAL HIGH (ref 6–20)
BUN: 35 mg/dL — ABNORMAL HIGH (ref 6–20)
CHLORIDE: 78 mmol/L — AB (ref 101–111)
CHLORIDE: 82 mmol/L — AB (ref 101–111)
CO2: 29 mmol/L (ref 22–32)
CO2: 30 mmol/L (ref 22–32)
Calcium: 9.1 mg/dL (ref 8.9–10.3)
Calcium: 8.6 mg/dL — ABNORMAL LOW (ref 8.9–10.3)
Creatinine, Ser: 1.37 mg/dL — ABNORMAL HIGH (ref 0.61–1.24)
Creatinine, Ser: 1.12 mg/dL (ref 0.61–1.24)
GFR calc Af Amer: >60 mL/min (ref >60)
GFR calc Af Amer: >60 mL/min (ref >60)
GFR calc non Af Amer: >60 mL/min (ref >60)
GFR calc non Af Amer: >60 mL/min (ref >60)
GLUCOSE: 160 mg/dL — AB (ref 65–99)
GLUCOSE: 129 mg/dL — AB (ref 65–99)
POTASSIUM: 3.9 mmol/L (ref 3.5–5.1)
Potassium: 3 mmol/L — ABNORMAL LOW (ref 3.5–5.1)
Sodium: 123 mmol/L — ABNORMAL LOW (ref 135–145)
Sodium: 123 mmol/L — ABNORMAL LOW (ref 135–145)

## 2017-08-16 LAB — CBC
HEMATOCRIT: 41 % (ref 39.0–52.0)
Hemoglobin: 14.6 g/dL (ref 13.0–17.0)
MCH: 30.2 pg (ref 26.0–34.0)
MCHC: 35.6 g/dL (ref 30.0–36.0)
MCV: 84.9 fL (ref 78.0–100.0)
Platelets: 490 10*3/uL — ABNORMAL HIGH (ref 150–400)
RBC: 4.83 MIL/uL (ref 4.22–5.81)
RDW: 13 % (ref 11.5–15.5)
WBC: 11.4 10*3/uL — AB (ref 4.0–10.5)

## 2017-08-16 MED ORDER — SODIUM CHLORIDE 0.9 % IV SOLN
INTRAVENOUS | Status: AC
  Administered 2017-08-16 – 2017-08-21 (×7): via INTRAVENOUS

## 2017-08-16 MED ORDER — SODIUM CHLORIDE 0.9 % IV BOLUS
1000.0000 mL | Freq: Once | INTRAVENOUS | Status: AC
  Administered 2017-08-16: 1000 mL via INTRAVENOUS

## 2017-08-16 MED ORDER — CALCIUM POLYCARBOPHIL 625 MG PO TABS
625.0000 mg | ORAL_TABLET | Freq: Two times a day (BID) | ORAL | Status: DC
  Administered 2017-08-16 – 2017-08-18 (×6): 625 mg via ORAL
  Filled 2017-08-16 (×7): qty 1

## 2017-08-16 MED ORDER — POTASSIUM CHLORIDE 10 MEQ/100ML IV SOLN
10.0000 meq | INTRAVENOUS | Status: AC
  Administered 2017-08-16 (×4): 10 meq via INTRAVENOUS
  Filled 2017-08-16 (×4): qty 100

## 2017-08-16 MED ORDER — SODIUM CHLORIDE 1 G PO TABS
2.0000 g | ORAL_TABLET | Freq: Three times a day (TID) | ORAL | Status: DC
  Administered 2017-08-16 (×2): 2 g via ORAL
  Filled 2017-08-16 (×3): qty 2

## 2017-08-16 NOTE — Progress Notes (Signed)
27 Days Post-Op   Subjective/Chief Complaint: Bloating better; had one episode of emesis yesterday. Stoma productive - 2.4L in last 24hrs. Ambulating throughout the day.  Tolerating reg diet and eating more and more despite emesis.  TPN is off  Objective: Vital signs in last 24 hours: Temp:  [97.9 F (36.6 C)-98.4 F (36.9 C)] 98.4 F (36.9 C) (05/01 0546) Pulse Rate:  [110-115] 115 (05/01 0546) Resp:  [12-17] 12 (05/01 0546) BP: (85-97)/(63-71) 85/68 (05/01 0546) SpO2:  [98 %-100 %] 98 % (05/01 0546) Last BM Date: 08/14/17  Intake/Output from previous day: 04/30 0701 - 05/01 0700 In: 0  Out: 3550 [Urine:550; Emesis/NG output:600; CHENI:7782] Intake/Output this shift: Total I/O In: 360 [P.O.:360] Out: 600 [Urine:200; Stool:400]  General appearance: alert and cooperative Resp: breathing comfortably Cardio: tachy GI: Abd soft, mildly distended, non tender. Stoma pink, productive; semi liquid stool and gas in appliance  Lab Results:  Recent Labs    08/16/17 0906  WBC 11.4*  HGB 14.6  HCT 41.0  PLT 490*   BMET Recent Labs    08/14/17 0320 08/16/17 0906  NA 128* 123*  K 3.6 3.0*  CL 85* 78*  CO2 29 29  GLUCOSE 117* 160*  BUN 28* 41*  CREATININE 0.99 1.37*  CALCIUM 9.3 9.1   PT/INR No results for input(s): LABPROT, INR in the last 72 hours. ABG No results for input(s): PHART, HCO3 in the last 72 hours.  Invalid input(s): PCO2, PO2  Studies/Results: No results found.  Anti-infectives: Anti-infectives (From admission, onward)   Start     Dose/Rate Route Frequency Ordered Stop   07/31/17 1000  piperacillin-tazobactam (ZOSYN) IVPB 3.375 g  Status:  Discontinued     3.375 g 12.5 mL/hr over 240 Minutes Intravenous Every 8 hours 07/31/17 0834 08/03/17 0738   07/27/17 1800  ertapenem (INVANZ) 1 g in sodium chloride 0.9 % 100 mL IVPB  Status:  Discontinued     1 g 200 mL/hr over 30 Minutes Intravenous Every 24 hours 07/27/17 1632 07/31/17 0834   07/20/17 2000   cefoTEtan (CEFOTAN) 2 g in sodium chloride 0.9 % 100 mL IVPB  Status:  Discontinued     2 g 200 mL/hr over 30 Minutes Intravenous Every 12 hours 07/20/17 1346 07/20/17 1415   07/20/17 2000  cefoTEtan in Dextrose 5% (CEFOTAN) IVPB 2 g     2 g Intravenous Every 12 hours 07/20/17 1415 07/20/17 1930   07/20/17 0540  cefoTEtan in Dextrose 5% (CEFOTAN) IVPB 2 g     2 g Intravenous On call to O.R. 07/20/17 0540 07/20/17 0817      Assessment/Plan: s/p Procedure(s): XI ROBOTIC ASSISTED LOWER ANTERIOR RESECTION ERAS PATHWAY (N/A) DIVERTING LOOP COLOSTOMY (N/A) Cont regular food today.  Pt instructed to stay away from raw fruits and vegetables and straight water intake. Ambulate 5x/day Cont PO pain meds Neurontin for foot neuropathy Hyponatremia: worse today, NaCL tabs increased to 2 g TID, ).9 NS bolus, 0.9 NS IV at 34ml/h, recheck Na level this pm Hypokalemia: 23meq IV, recheck in AM PPx: Lov, SCDs   LOS: 27 days   Magdalyn Arenivas C. 07/19/3534

## 2017-08-17 LAB — BASIC METABOLIC PANEL
Anion gap: 12 (ref 5–15)
Anion gap: 13 (ref 5–15)
BUN: 30 mg/dL — ABNORMAL HIGH (ref 6–20)
BUN: 28 mg/dL — ABNORMAL HIGH (ref 6–20)
CHLORIDE: 80 mmol/L — AB (ref 101–111)
CHLORIDE: 83 mmol/L — AB (ref 101–111)
CO2: 30 mmol/L (ref 22–32)
CO2: 29 mmol/L (ref 22–32)
Calcium: 8.9 mg/dL (ref 8.9–10.3)
Calcium: 8.7 mg/dL — ABNORMAL LOW (ref 8.9–10.3)
Creatinine, Ser: 1.14 mg/dL (ref 0.61–1.24)
Creatinine, Ser: 1.11 mg/dL (ref 0.61–1.24)
GFR calc Af Amer: >60 mL/min (ref >60)
GFR calc Af Amer: >60 mL/min (ref >60)
GFR calc non Af Amer: >60 mL/min (ref >60)
GFR calc non Af Amer: >60 mL/min (ref >60)
GLUCOSE: 119 mg/dL — AB (ref 65–99)
Glucose, Bld: 115 mg/dL — ABNORMAL HIGH (ref 65–99)
POTASSIUM: 3.5 mmol/L (ref 3.5–5.1)
POTASSIUM: 3.5 mmol/L (ref 3.5–5.1)
SODIUM: 122 mmol/L — AB (ref 135–145)
SODIUM: 125 mmol/L — AB (ref 135–145)

## 2017-08-17 MED ORDER — SODIUM CHLORIDE 1 G PO TABS
3.0000 g | ORAL_TABLET | Freq: Three times a day (TID) | ORAL | Status: DC
  Administered 2017-08-17 – 2017-08-18 (×5): 3 g via ORAL
  Filled 2017-08-17 (×16): qty 3

## 2017-08-17 MED ORDER — LOPERAMIDE HCL 2 MG PO CAPS
4.0000 mg | ORAL_CAPSULE | Freq: Three times a day (TID) | ORAL | Status: DC
  Administered 2017-08-17 (×3): 4 mg via ORAL
  Filled 2017-08-17 (×3): qty 2

## 2017-08-17 NOTE — Progress Notes (Signed)
28 Days Post-Op   Subjective/Chief Complaint: Bloating better;no episodes of emesis yesterday. Stoma productive - 1.8L in last 24hrs. Ambulating throughout the day.  Tolerating reg diet and eating more   Objective: Vital signs in last 24 hours: Temp:  [98.4 F (36.9 C)-98.9 F (37.2 C)] 98.4 F (36.9 C) (05/02 0517) Pulse Rate:  [101-102] 101 (05/02 0517) Resp:  [16-18] 16 (05/02 0517) BP: (92-96)/(70-75) 96/72 (05/02 0517) SpO2:  [97 %-100 %] 98 % (05/02 0517) Last BM Date: 08/14/17  Intake/Output from previous day: 05/01 0701 - 05/02 0700 In: 1912.5 [P.O.:720; I.V.:1192.5] Out: 2650 [Urine:850; DGUYQ:0347] Intake/Output this shift: No intake/output data recorded.  General appearance: alert and cooperative Resp: breathing comfortably Cardio: tachy GI: Abd soft, mildly distended, non tender. Stoma pink, productive; semi liquid stool and gas in appliance  Lab Results:  Recent Labs    08/16/17 0906  WBC 11.4*  HGB 14.6  HCT 41.0  PLT 490*   BMET Recent Labs    08/16/17 1611 08/17/17 0513  NA 123* 122*  K 3.9 3.5  CL 82* 80*  CO2 30 30  GLUCOSE 129* 119*  BUN 35* 30*  CREATININE 1.12 1.14  CALCIUM 8.6* 8.9   PT/INR No results for input(s): LABPROT, INR in the last 72 hours. ABG No results for input(s): PHART, HCO3 in the last 72 hours.  Invalid input(s): PCO2, PO2  Studies/Results: No results found.  Anti-infectives: Anti-infectives (From admission, onward)   Start     Dose/Rate Route Frequency Ordered Stop   07/31/17 1000  piperacillin-tazobactam (ZOSYN) IVPB 3.375 g  Status:  Discontinued     3.375 g 12.5 mL/hr over 240 Minutes Intravenous Every 8 hours 07/31/17 0834 08/03/17 0738   07/27/17 1800  ertapenem (INVANZ) 1 g in sodium chloride 0.9 % 100 mL IVPB  Status:  Discontinued     1 g 200 mL/hr over 30 Minutes Intravenous Every 24 hours 07/27/17 1632 07/31/17 0834   07/20/17 2000  cefoTEtan (CEFOTAN) 2 g in sodium chloride 0.9 % 100 mL IVPB   Status:  Discontinued     2 g 200 mL/hr over 30 Minutes Intravenous Every 12 hours 07/20/17 1346 07/20/17 1415   07/20/17 2000  cefoTEtan in Dextrose 5% (CEFOTAN) IVPB 2 g     2 g Intravenous Every 12 hours 07/20/17 1415 07/20/17 1930   07/20/17 0540  cefoTEtan in Dextrose 5% (CEFOTAN) IVPB 2 g     2 g Intravenous On call to O.R. 07/20/17 0540 07/20/17 0817      Assessment/Plan: s/p Procedure(s): XI ROBOTIC ASSISTED LOWER ANTERIOR RESECTION ERAS PATHWAY (N/A) DIVERTING LOOP COLOSTOMY (N/A) Cont regular food today.  Pt instructed to stay away from raw fruits and vegetables and straight water intake.  Increase dietary sodium Ambulate 5x/day Cont PO pain meds Neurontin for foot neuropathy Hyponatremia: worse today, NaCL tabs increased to 3 g TID, Cont 0.9 NS IV at 22ml/h, start imodium to slow ostomy output.  recheck Na level this pm Hypokalemia: resolved with IV replacements PPx: Lov, SCDs   LOS: 28 days   Eadie Repetto C. 07/19/5954

## 2017-08-17 NOTE — Consult Note (Signed)
[place] Nurse ostomy follow up Pt changed ostomy pouch himself Saturday.  States he is independent in all aspects of care for ostomy. There are 7 sets of pouches and barrier rings  in the room for use here and once discharged.  Indigent plan information for supplies previously discussed with patient, states he understands.  He has no further questions at this time. Remains hyponatremic and will not be discharging today. No further needs from [facility] team as he has been in house for 28 days. If need [place] to assist further please place new consult. Fara Olden, RN-C, WTA-C, [facility] Wound Treatment Associate Ostomy Care Associate       Revision History

## 2017-08-18 LAB — BASIC METABOLIC PANEL
Anion gap: 13 (ref 5–15)
BUN: 24 mg/dL — AB (ref 6–20)
CO2: 27 mmol/L (ref 22–32)
CREATININE: 1.13 mg/dL (ref 0.61–1.24)
Calcium: 8.7 mg/dL — ABNORMAL LOW (ref 8.9–10.3)
Chloride: 83 mmol/L — ABNORMAL LOW (ref 101–111)
GFR calc Af Amer: >60 mL/min (ref >60)
GLUCOSE: 116 mg/dL — AB (ref 65–99)
POTASSIUM: 4 mmol/L (ref 3.5–5.1)
SODIUM: 123 mmol/L — AB (ref 135–145)

## 2017-08-18 MED ORDER — SODIUM CHLORIDE 0.9% FLUSH
10.0000 mL | INTRAVENOUS | Status: DC | PRN
  Administered 2017-08-29: 10 mL
  Filled 2017-08-18: qty 40

## 2017-08-18 MED ORDER — DIPHENOXYLATE-ATROPINE 2.5-0.025 MG PO TABS
2.0000 | ORAL_TABLET | Freq: Four times a day (QID) | ORAL | Status: DC
  Administered 2017-08-18 (×4): 2 via ORAL
  Filled 2017-08-18 (×4): qty 2

## 2017-08-18 MED ORDER — SODIUM CHLORIDE 0.9% FLUSH
10.0000 mL | Freq: Two times a day (BID) | INTRAVENOUS | Status: DC
  Administered 2017-08-23 – 2017-08-29 (×3): 10 mL

## 2017-08-18 NOTE — Progress Notes (Signed)
29 Days Post-Op   Subjective/Chief Complaint: Bloating a little worse with imodium; no episodes of emesis yesterday. Stoma productive - 2.6L in last 24hrs. Ambulating throughout the day.  Tolerating reg diet and eating more   Objective: Vital signs in last 24 hours: Temp:  [98.4 F (36.9 C)-99 F (37.2 C)] 98.4 F (36.9 C) (05/03 0536) Pulse Rate:  [96-107] 96 (05/03 0536) Resp:  [14-18] 18 (05/03 0536) BP: (92-103)/(63-75) 93/63 (05/03 0536) SpO2:  [79 %-100 %] 99 % (05/03 0536) Last BM Date: 08/17/17  Intake/Output from previous day: 05/02 0701 - 05/03 0700 In: 1892 [P.O.:1440; I.V.:452] Out: 3925 [Urine:1300; Stool:2625] Intake/Output this shift: No intake/output data recorded.  General appearance: alert and cooperative Resp: breathing comfortably Cardio: tachy GI: Abd soft, mildly distended, non tender. Stoma pink, productive; semi liquid stool and gas in appliance  Lab Results:  Recent Labs    08/16/17 0906  WBC 11.4*  HGB 14.6  HCT 41.0  PLT 490*   BMET Recent Labs    08/17/17 1618 08/18/17 0447  NA 125* 123*  K 3.5 4.0  CL 83* 83*  CO2 29 27  GLUCOSE 115* 116*  BUN 28* 24*  CREATININE 1.11 1.13  CALCIUM 8.7* 8.7*   PT/INR No results for input(s): LABPROT, INR in the last 72 hours. ABG No results for input(s): PHART, HCO3 in the last 72 hours.  Invalid input(s): PCO2, PO2  Studies/Results: No results found.  Anti-infectives: Anti-infectives (From admission, onward)   Start     Dose/Rate Route Frequency Ordered Stop   07/31/17 1000  piperacillin-tazobactam (ZOSYN) IVPB 3.375 g  Status:  Discontinued     3.375 g 12.5 mL/hr over 240 Minutes Intravenous Every 8 hours 07/31/17 0834 08/03/17 0738   07/27/17 1800  ertapenem (INVANZ) 1 g in sodium chloride 0.9 % 100 mL IVPB  Status:  Discontinued     1 g 200 mL/hr over 30 Minutes Intravenous Every 24 hours 07/27/17 1632 07/31/17 0834   07/20/17 2000  cefoTEtan (CEFOTAN) 2 g in sodium chloride 0.9 %  100 mL IVPB  Status:  Discontinued     2 g 200 mL/hr over 30 Minutes Intravenous Every 12 hours 07/20/17 1346 07/20/17 1415   07/20/17 2000  cefoTEtan in Dextrose 5% (CEFOTAN) IVPB 2 g     2 g Intravenous Every 12 hours 07/20/17 1415 07/20/17 1930   07/20/17 0540  cefoTEtan in Dextrose 5% (CEFOTAN) IVPB 2 g     2 g Intravenous On call to O.R. 07/20/17 0540 07/20/17 0817      Assessment/Plan: s/p Procedure(s): XI ROBOTIC ASSISTED LOWER ANTERIOR RESECTION ERAS PATHWAY (N/A) DIVERTING LOOP COLOSTOMY (N/A) Cont regular food today.  Pt instructed to stay away from raw fruits and vegetables and straight water intake.  Increase dietary sodium Ambulate 5x/day Cont PO pain meds Neurontin for foot neuropathy Hyponatremia: worse today, NaCL tabs increased to 3 g TID, Cont 0.9 NS IV at 58ml/h, change to Lomotil to slow ostomy output.  recheck Na level tom am Hypokalemia: resolved with IV replacements PPx: Lov, SCDs   LOS: 29 days   Brigg Cape C. 12/20/8544

## 2017-08-19 LAB — BASIC METABOLIC PANEL
Anion gap: 15 (ref 5–15)
BUN: 19 mg/dL (ref 6–20)
CHLORIDE: 81 mmol/L — AB (ref 101–111)
CO2: 29 mmol/L (ref 22–32)
CREATININE: 1.04 mg/dL (ref 0.61–1.24)
Calcium: 9.3 mg/dL (ref 8.9–10.3)
GFR calc non Af Amer: >60 mL/min (ref >60)
GLUCOSE: 115 mg/dL — AB (ref 65–99)
Potassium: 3.3 mmol/L — ABNORMAL LOW (ref 3.5–5.1)
Sodium: 125 mmol/L — ABNORMAL LOW (ref 135–145)

## 2017-08-19 MED ORDER — PROMETHAZINE HCL 25 MG/ML IJ SOLN
12.5000 mg | Freq: Once | INTRAMUSCULAR | Status: DC
  Filled 2017-08-19: qty 1

## 2017-08-19 MED ORDER — ENSURE ENLIVE PO LIQD
237.0000 mL | Freq: Two times a day (BID) | ORAL | Status: DC
  Administered 2017-08-19 – 2017-08-27 (×6): 237 mL via ORAL

## 2017-08-19 MED ORDER — POTASSIUM CHLORIDE CRYS ER 20 MEQ PO TBCR
40.0000 meq | EXTENDED_RELEASE_TABLET | Freq: Once | ORAL | Status: AC
  Administered 2017-08-19: 40 meq via ORAL
  Filled 2017-08-19: qty 2

## 2017-08-19 NOTE — Progress Notes (Signed)
Dr. Marcello Moores aware of Large amount of clear, brownish emesis pt vomited into trach can. See new orders received for Phenergan to be given once now. No other orders at this time.

## 2017-08-19 NOTE — Progress Notes (Signed)
30 Days Post-Op   Subjective/Chief Complaint: Bloating and pain worse with Lomotil; 1 episode of emesis yesterday. Stoma high output- 3.0L in last 24hrs. Ambulating throughout the day.  Tolerating min reg diet and eating much less yesterday  Objective: Vital signs in last 24 hours: Temp:  [98.2 F (36.8 C)-98.6 F (37 C)] 98.2 F (36.8 C) (05/04 0539) Pulse Rate:  [93-106] 95 (05/04 0539) Resp:  [17-18] 18 (05/04 0539) BP: (97-106)/(73-78) 101/78 (05/04 0539) SpO2:  [100 %] 100 % (05/04 0539) Weight:  [67.3 kg (148 lb 5.9 oz)] 67.3 kg (148 lb 5.9 oz) (05/03 1314) Last BM Date: 08/18/17  Intake/Output from previous day: 05/03 0701 - 05/04 0700 In: 2953 [P.O.:703; I.V.:2250] Out: 3800 [Urine:775; HYQMV:7846] Intake/Output this shift: No intake/output data recorded.  General appearance: alert and cooperative Resp: breathing comfortably Cardio: tachy GI: Abd soft, distended, non tender. Stoma pink, productive; semi liquid stool and gas in appliance  Lab Results:  Recent Labs    08/16/17 0906  WBC 11.4*  HGB 14.6  HCT 41.0  PLT 490*   BMET Recent Labs    08/18/17 0447 08/19/17 0438  NA 123* 125*  K 4.0 3.3*  CL 83* 81*  CO2 27 29  GLUCOSE 116* 115*  BUN 24* 19  CREATININE 1.13 1.04  CALCIUM 8.7* 9.3   PT/INR No results for input(s): LABPROT, INR in the last 72 hours. ABG No results for input(s): PHART, HCO3 in the last 72 hours.  Invalid input(s): PCO2, PO2  Studies/Results: No results found.  Anti-infectives: Anti-infectives (From admission, onward)   Start     Dose/Rate Route Frequency Ordered Stop   07/31/17 1000  piperacillin-tazobactam (ZOSYN) IVPB 3.375 g  Status:  Discontinued     3.375 g 12.5 mL/hr over 240 Minutes Intravenous Every 8 hours 07/31/17 0834 08/03/17 0738   07/27/17 1800  ertapenem (INVANZ) 1 g in sodium chloride 0.9 % 100 mL IVPB  Status:  Discontinued     1 g 200 mL/hr over 30 Minutes Intravenous Every 24 hours 07/27/17 1632  07/31/17 0834   07/20/17 2000  cefoTEtan (CEFOTAN) 2 g in sodium chloride 0.9 % 100 mL IVPB  Status:  Discontinued     2 g 200 mL/hr over 30 Minutes Intravenous Every 12 hours 07/20/17 1346 07/20/17 1415   07/20/17 2000  cefoTEtan in Dextrose 5% (CEFOTAN) IVPB 2 g     2 g Intravenous Every 12 hours 07/20/17 1415 07/20/17 1930   07/20/17 0540  cefoTEtan in Dextrose 5% (CEFOTAN) IVPB 2 g     2 g Intravenous On call to O.R. 07/20/17 0540 07/20/17 0817      Assessment/Plan: s/p Procedure(s): XI ROBOTIC ASSISTED LOWER ANTERIOR RESECTION ERAS PATHWAY (N/A) DIVERTING LOOP COLOSTOMY (N/A) Cont regular food today.  Ensure between meals. Pt instructed to stay away from raw fruits and vegetables and straight water intake.  Increase dietary sodium Ambulate 5x/day Cont PO pain meds Neurontin for foot neuropathy Hyponatremia: somewhat improved today despite high output ileostomy, Cont NaCL tabs 3 g TID, Cont 0.9 NS IV at 22ml/h, d/c anti motility agents.  recheck Na level tom am Hypokalemia:worse after emesis.  Will replace orally today PPx: Lov, SCDs   LOS: 30 days   Jacob Galvan C. 12/23/2950

## 2017-08-20 ENCOUNTER — Inpatient Hospital Stay (HOSPITAL_COMMUNITY): Payer: Medicaid Other

## 2017-08-20 LAB — BASIC METABOLIC PANEL
Anion gap: 17 — ABNORMAL HIGH (ref 5–15)
BUN: 20 mg/dL (ref 6–20)
CHLORIDE: 80 mmol/L — AB (ref 101–111)
CO2: 32 mmol/L (ref 22–32)
CREATININE: 1.03 mg/dL (ref 0.61–1.24)
Calcium: 9 mg/dL (ref 8.9–10.3)
GFR calc Af Amer: >60 mL/min (ref >60)
GFR calc non Af Amer: >60 mL/min (ref >60)
GLUCOSE: 124 mg/dL — AB (ref 65–99)
Potassium: 3 mmol/L — ABNORMAL LOW (ref 3.5–5.1)
Sodium: 129 mmol/L — ABNORMAL LOW (ref 135–145)

## 2017-08-20 MED ORDER — FAMOTIDINE IN NACL 20-0.9 MG/50ML-% IV SOLN
20.0000 mg | Freq: Two times a day (BID) | INTRAVENOUS | Status: DC
  Administered 2017-08-20 – 2017-08-24 (×10): 20 mg via INTRAVENOUS
  Filled 2017-08-20 (×11): qty 50

## 2017-08-20 MED ORDER — POTASSIUM CHLORIDE 10 MEQ/100ML IV SOLN
10.0000 meq | INTRAVENOUS | Status: AC
  Administered 2017-08-20 (×4): 10 meq via INTRAVENOUS
  Filled 2017-08-20 (×4): qty 100

## 2017-08-20 NOTE — Progress Notes (Signed)
31 Days Post-Op   Subjective/Chief Complaint: Bloating and pain worse with vomiting x 2 in last 24h. Stoma high output- 2.8L in last 24hrs. Ambulating throughout the day.  Tolerating min reg diet and eating much less yesterday  Objective: Vital signs in last 24 hours: Temp:  [98.3 F (36.8 C)-98.7 F (37.1 C)] 98.7 F (37.1 C) (05/05 0604) Pulse Rate:  [98-101] 101 (05/05 0604) Resp:  [15-17] 15 (05/05 0604) BP: (99-102)/(65-79) 102/79 (05/05 0604) SpO2:  [97 %-100 %] 100 % (05/05 0604) Last BM Date: 08/19/17  Intake/Output from previous day: 05/04 0701 - 05/05 0700 In: 3254.6 [P.O.:900; I.V.:2354.6] Out: 3550 [Urine:750; Stool:2800] Intake/Output this shift: Total I/O In: -  Out: 300 [Stool:300]  General appearance: alert and cooperative Resp: breathing comfortably Cardio: RRR GI: Abd soft, distended, non tender. Stoma pink, productive; semi liquid stool in appliance  Lab Results:  No results for input(s): WBC, HGB, HCT, PLT in the last 72 hours. BMET Recent Labs    08/19/17 0438 08/20/17 0342  NA 125* 129*  K 3.3* 3.0*  CL 81* 80*  CO2 29 32  GLUCOSE 115* 124*  BUN 19 20  CREATININE 1.04 1.03  CALCIUM 9.3 9.0   PT/INR No results for input(s): LABPROT, INR in the last 72 hours. ABG No results for input(s): PHART, HCO3 in the last 72 hours.  Invalid input(s): PCO2, PO2  Studies/Results: No results found.  Anti-infectives: Anti-infectives (From admission, onward)   Start     Dose/Rate Route Frequency Ordered Stop   07/31/17 1000  piperacillin-tazobactam (ZOSYN) IVPB 3.375 g  Status:  Discontinued     3.375 g 12.5 mL/hr over 240 Minutes Intravenous Every 8 hours 07/31/17 0834 08/03/17 0738   07/27/17 1800  ertapenem (INVANZ) 1 g in sodium chloride 0.9 % 100 mL IVPB  Status:  Discontinued     1 g 200 mL/hr over 30 Minutes Intravenous Every 24 hours 07/27/17 1632 07/31/17 0834   07/20/17 2000  cefoTEtan (CEFOTAN) 2 g in sodium chloride 0.9 % 100 mL IVPB   Status:  Discontinued     2 g 200 mL/hr over 30 Minutes Intravenous Every 12 hours 07/20/17 1346 07/20/17 1415   07/20/17 2000  cefoTEtan in Dextrose 5% (CEFOTAN) IVPB 2 g     2 g Intravenous Every 12 hours 07/20/17 1415 07/20/17 1930   07/20/17 0540  cefoTEtan in Dextrose 5% (CEFOTAN) IVPB 2 g     2 g Intravenous On call to O.R. 07/20/17 0540 07/20/17 0817      Assessment/Plan: s/p Procedure(s): XI ROBOTIC ASSISTED LOWER ANTERIOR RESECTION ERAS PATHWAY (N/A) DIVERTING LOOP COLOSTOMY (N/A) Cont regular food today as tolerated.  Ensure between meals. Pt instructed to stay away from raw fruits and vegetables and straight water intake.  Increase dietary sodium Ambulate 5x/day Cont PO pain meds Neurontin for foot neuropathy Hyponatremia: somewhat improved today despite high output ileostomy, Cont NaCL tabs 3 g TID, Cont 0.9 NS IV at 156ml/h, d/c anti motility agents.  recheck Na level tom am Hypokalemia:worse after emesis.  Will replace IV today Will add protonix due to nausea for ulcer prophylaxis PPx: Lov, SCDs   LOS: 31 days   Fenna Semel C. 05/25/5168

## 2017-08-20 NOTE — Progress Notes (Signed)
Dr. Harlow Asa aware via phone that pt had another vomiting episode. 350 ml green fluid emesis. Feeling better after antiemetic. No new orders. Md advised pt to just take in clear liquids this rest of the evening. Pt reassured and verbalized understanding. Popcicle and jello provided per request.

## 2017-08-21 ENCOUNTER — Inpatient Hospital Stay (HOSPITAL_COMMUNITY): Payer: Medicaid Other

## 2017-08-21 LAB — CBC
HEMATOCRIT: 40.7 % (ref 39.0–52.0)
HEMOGLOBIN: 14 g/dL (ref 13.0–17.0)
MCH: 29.5 pg (ref 26.0–34.0)
MCHC: 34.4 g/dL (ref 30.0–36.0)
MCV: 85.7 fL (ref 78.0–100.0)
Platelets: 424 10*3/uL — ABNORMAL HIGH (ref 150–400)
RBC: 4.75 MIL/uL (ref 4.22–5.81)
RDW: 13 % (ref 11.5–15.5)
WBC: 14.8 10*3/uL — ABNORMAL HIGH (ref 4.0–10.5)

## 2017-08-21 LAB — COMPREHENSIVE METABOLIC PANEL
ALBUMIN: 3.7 g/dL (ref 3.5–5.0)
ALT: 347 U/L — ABNORMAL HIGH (ref 17–63)
ANION GAP: 17 — AB (ref 5–15)
AST: 129 U/L — ABNORMAL HIGH (ref 15–41)
Alkaline Phosphatase: 195 U/L — ABNORMAL HIGH (ref 38–126)
BILIRUBIN TOTAL: 0.9 mg/dL (ref 0.3–1.2)
BUN: 24 mg/dL — ABNORMAL HIGH (ref 6–20)
CHLORIDE: 80 mmol/L — AB (ref 101–111)
CO2: 31 mmol/L (ref 22–32)
Calcium: 9.6 mg/dL (ref 8.9–10.3)
Creatinine, Ser: 1.2 mg/dL (ref 0.61–1.24)
GFR calc Af Amer: >60 mL/min (ref >60)
GFR calc non Af Amer: >60 mL/min (ref >60)
GLUCOSE: 133 mg/dL — AB (ref 65–99)
POTASSIUM: 3.2 mmol/L — AB (ref 3.5–5.1)
SODIUM: 128 mmol/L — AB (ref 135–145)
TOTAL PROTEIN: 8.8 g/dL — AB (ref 6.5–8.1)

## 2017-08-21 LAB — MAGNESIUM: MAGNESIUM: 2 mg/dL (ref 1.7–2.4)

## 2017-08-21 LAB — GLUCOSE, CAPILLARY: Glucose-Capillary: 139 mg/dL — ABNORMAL HIGH (ref 65–99)

## 2017-08-21 LAB — PHOSPHORUS: Phosphorus: 4.8 mg/dL — ABNORMAL HIGH (ref 2.5–4.6)

## 2017-08-21 MED ORDER — IOPAMIDOL (ISOVUE-300) INJECTION 61%
INTRAVENOUS | Status: AC
  Administered 2017-08-21: 15 mL
  Filled 2017-08-21: qty 30

## 2017-08-21 MED ORDER — IOPAMIDOL (ISOVUE-300) INJECTION 61%
30.0000 mL | Freq: Once | INTRAVENOUS | Status: AC
  Administered 2017-08-21: 30 mL via ORAL

## 2017-08-21 MED ORDER — FAT EMULSION PLANT BASED 20 % IV EMUL
240.0000 mL | INTRAVENOUS | Status: AC
  Administered 2017-08-21: 240 mL via INTRAVENOUS
  Filled 2017-08-21: qty 250

## 2017-08-21 MED ORDER — SODIUM CHLORIDE 0.9 % IV SOLN: INTRAVENOUS | Status: AC

## 2017-08-21 MED ORDER — TRACE MINERALS CR-CU-MN-SE-ZN 10-1000-500-60 MCG/ML IV SOLN
INTRAVENOUS | Status: AC
  Administered 2017-08-21: 18:00:00 via INTRAVENOUS
  Filled 2017-08-21: qty 960

## 2017-08-21 MED ORDER — SODIUM CHLORIDE 0.9 % IV BOLUS
1000.0000 mL | Freq: Once | INTRAVENOUS | Status: AC
  Administered 2017-08-21: 1000 mL via INTRAVENOUS

## 2017-08-21 MED ORDER — IOHEXOL 300 MG/ML  SOLN
100.0000 mL | Freq: Once | INTRAMUSCULAR | Status: AC | PRN
  Administered 2017-08-21: 100 mL via INTRAVENOUS

## 2017-08-21 MED ORDER — INSULIN ASPART 100 UNIT/ML ~~LOC~~ SOLN
0.0000 [IU] | Freq: Three times a day (TID) | SUBCUTANEOUS | Status: DC
  Administered 2017-08-21 – 2017-08-22 (×3): 1 [IU] via SUBCUTANEOUS
  Administered 2017-08-23 (×2): 2 [IU] via SUBCUTANEOUS
  Administered 2017-08-23 – 2017-08-24 (×2): 1 [IU] via SUBCUTANEOUS

## 2017-08-21 MED ORDER — POTASSIUM CHLORIDE 10 MEQ/100ML IV SOLN
10.0000 meq | INTRAVENOUS | Status: AC
  Administered 2017-08-21 (×4): 10 meq via INTRAVENOUS
  Filled 2017-08-21 (×4): qty 100

## 2017-08-21 NOTE — Progress Notes (Signed)
Nutrition Follow-up  DOCUMENTATION CODES:   Severe malnutrition in context of chronic illness  INTERVENTION:   TPN per Pharmacy  NUTRITION DIAGNOSIS:   Severe Malnutrition related to chronic illness, cancer and cancer related treatments as evidenced by moderate fat depletion, severe fat depletion, moderate muscle depletion, severe muscle depletion.  Ongoing.  GOAL:   Patient will meet greater than or equal to 90% of their needs  Not meeting.  MONITOR:   Labs, I & O's, Skin, Weight trends, Diet advancement, TPN  REASON FOR ASSESSMENT:   Consult New TPN/TNA  ASSESSMENT:   42 year old male with PMH significant for stage II colorectal cancer s/p chemo and radiation.  4/4-s/p robotic assisted lower anterior resection with splenic flexure mobilization and diverting loop ileostomy 4/11-CT shows ileus with small presacral fluid collection 4/12 - NPO,TPN 4/20- clear liquids 4/22- full liquids 4/29 - TPN d/c, diet advanced to soft diet  Pt has been having N/V over the weekend. Emesis has been bilious. Pt has been having high ostomy output. TPN to be re-initiated. Pt has been drinking Ensure supplements ~50% of the time.   TPN to begin at 1800,Clinimix 5/15 at40 ml/hr with 20% ILE @ 20 ml/hr x 12 hours.   Medications: Iv Zofran PRN, IV Phenergan PRN Labs reviewed: Low Na, K Elevated Phos Mg WNL  Diet Order:   Diet Order           Diet NPO time specified Except for: Ice Chips, Sips with Meds  Diet effective now          EDUCATION NEEDS:   No education needs have been identified at this time  Skin:  Skin Assessment: Skin Integrity Issues: Skin Integrity Issues:: Incisions Incisions: surgical incision to abdomen  Last BM:  5/5  Height:   Ht Readings from Last 1 Encounters:  07/20/17 6\' 2"  (1.88 m)    Weight:   Wt Readings from Last 1 Encounters:  08/18/17 148 lb 5.9 oz (67.3 kg)    Ideal Body Weight:  86.4 kg  BMI:  Body mass index is 19.05  kg/m.  Estimated Nutritional Needs:   Kcal:  2000-2200 (29-32 kcal/kg)  Protein:  90-100g  Fluid:  2.0-2.2 L/day  Clayton Bibles, MS, RD, LDN [facility] In[facility] Clinical Dietitian Pager: [phone] After Hours Pager: [phone]

## 2017-08-21 NOTE — Progress Notes (Signed)
32 Days Post-Op   Subjective/Chief Complaint: Bloating and pain worse with bilious vomiting in last 24h. Stoma high output- 2.8L in last 24hrs.   Objective: Vital signs in last 24 hours: Temp:  [98 F (36.7 C)-98.7 F (37.1 C)] 98 F (36.7 C) (05/06 0601) Pulse Rate:  [103-107] 107 (05/06 0601) Resp:  [15-16] 15 (05/06 0601) BP: (95-111)/(65-82) 95/73 (05/06 0601) SpO2:  [98 %-100 %] 100 % (05/06 0601) Last BM Date: 08/20/17  Intake/Output from previous day: 05/05 0701 - 05/06 0700 In: 2450 [I.V.:2400; IV Piggyback:50] Out: 4657.3 [Urine:200; Emesis/NG output:350; Stool:4107.3] Intake/Output this shift: Total I/O In: -  Out: 200 [Stool:200]  General appearance: alert and cooperative Resp: breathing comfortably Cardio: RRR GI: Abd soft, distended, non tender. Stoma pink, productive; semi liquid stool in appliance  Lab Results:  Recent Labs    08/21/17 0351  WBC 14.8*  HGB 14.0  HCT 40.7  PLT 424*   BMET Recent Labs    08/20/17 0342 08/21/17 0351  NA 129* 128*  K 3.0* 3.2*  CL 80* 80*  CO2 32 31  GLUCOSE 124* 133*  BUN 20 24*  CREATININE 1.03 1.20  CALCIUM 9.0 9.6   PT/INR No results for input(s): LABPROT, INR in the last 72 hours. ABG No results for input(s): PHART, HCO3 in the last 72 hours.  Invalid input(s): PCO2, PO2  Studies/Results: Dg Abd 2 Views  Result Date: 08/20/2017 CLINICAL DATA:  Small bowel obstruction. History of LAR and diverting ileostomy for rectal cancer. EXAM: ABDOMEN - 2 VIEW COMPARISON:  08/01/2017 abdominal radiograph. 08/08/2017 CT abdomen/pelvis. FINDINGS: Ostomy device overlies the right abdomen. There is marked diffuse small bowel dilatation up to 7.8 cm diameter, increased from 6.2 cm on 08/01/2017 radiograph and 4.4 cm on 08/08/2017 CT topogram. Fluid levels throughout the dilated small bowel loops. No evidence of pneumatosis or pneumoperitoneum. No radiopaque nephrolithiasis. Clear lung bases. IMPRESSION: Marked diffuse  small bowel dilatation with air-fluid levels, worsened since 08/08/2017 CT and 08/01/2017 radiograph, most compatible with worsening distal small bowel obstruction. Electronically Signed   By: Ilona Sorrel M.D.   On: 08/20/2017 14:11    Anti-infectives: Anti-infectives (From admission, onward)   Start     Dose/Rate Route Frequency Ordered Stop   07/31/17 1000  piperacillin-tazobactam (ZOSYN) IVPB 3.375 g  Status:  Discontinued     3.375 g 12.5 mL/hr over 240 Minutes Intravenous Every 8 hours 07/31/17 0834 08/03/17 0738   07/27/17 1800  ertapenem (INVANZ) 1 g in sodium chloride 0.9 % 100 mL IVPB  Status:  Discontinued     1 g 200 mL/hr over 30 Minutes Intravenous Every 24 hours 07/27/17 1632 07/31/17 0834   07/20/17 2000  cefoTEtan (CEFOTAN) 2 g in sodium chloride 0.9 % 100 mL IVPB  Status:  Discontinued     2 g 200 mL/hr over 30 Minutes Intravenous Every 12 hours 07/20/17 1346 07/20/17 1415   07/20/17 2000  cefoTEtan in Dextrose 5% (CEFOTAN) IVPB 2 g     2 g Intravenous Every 12 hours 07/20/17 1415 07/20/17 1930   07/20/17 0540  cefoTEtan in Dextrose 5% (CEFOTAN) IVPB 2 g     2 g Intravenous On call to O.R. 07/20/17 0540 07/20/17 0817      Assessment/Plan: s/p Procedure(s): XI ROBOTIC ASSISTED LOWER ANTERIOR RESECTION ERAS PATHWAY (N/A) DIVERTING LOOP COLOSTOMY (N/A) NPO CT scan to eval for any changes in abd to account for pSBO Ambulate 5x/day Cont PO pain meds as tolerated Neurontin for foot neuropathy Hyponatremia:  somewhat improved today despite high output ileostomy, Cont NaCL tabs 3 g TID, Cont 0.9 NS IV at 146ml/h, d/c anti motility agents.  recheck Na level tom am.  Will bolus 1 L prior to CT scan Hypokalemia:worse after emesis.  Will replace IV today Will add protonix due to nausea for ulcer prophylaxis PPx: Lov, SCDs   LOS: 32 days   Jacob Galvan C. 4/0/9811

## 2017-08-21 NOTE — Progress Notes (Signed)
Another call made to the Financial Counselor and another voicemail left in an attempt to have someone talk to patient and family about Medicaid/Medicare forms. Donne Hazel, RN

## 2017-08-21 NOTE — Progress Notes (Signed)
PHARMACY - ADULT TOTAL PARENTERAL NUTRITION CONSULT NOTE   Pharmacy Consult for TPN Indication: prolonged ileus- resumed on 5/6  Patient Measurements: Height: 6' 2"  (188 cm) Weight: 148 lb 5.9 oz (67.3 kg) IBW/kg (Calculated) : 82.2 TPN AdjBW (KG): 73 Body mass index is 19.05 kg/m.   Insulin Requirements: not on insulin  Current Nutrition:  - ensure 237 mL BID stated on 5/4 --> not taking consistently taking - TPN resumed on 5/6  IVF: NS at 150 mL/hr  Central access: double lumen PAC TPN start date: 07/28/17  Recent Labs    08/20/17 0342 08/21/17 0351  NA 129* 128*  K 3.0* 3.2*  CL 80* 80*  CO2 32 31  GLUCOSE 124* 133*  BUN 20 24*  CREATININE 1.03 1.20  CALCIUM 9.0 9.6  ALBUMIN  --  3.7  ALKPHOS  --  195*  AST  --  129*  ALT  --  347*  BILITOT  --  0.9    ASSESSMENT                                                                                                          HPI: 29 yoM with PMH colorectal cancer s/p chemo/rads and now s/p lower anterior resection with diverting loop ileostomy performed on 4/4. On 4/11 the patient developed post-op ileus and was made NPO. Also treating for potential anastomotic leak seen on CT as small presacral fluid collection. The patient's nutritional status has been poor for quite some time despite being on a diet until recently. TPN started on 4/12 - 4/27, resumed on 5/6.  Significant events:  - 4/15: aspiration of pelvic collection - 4/16: IV port clotted. TPN infusion d/ced at 1730 and resumed back at ~2200 - 4/20: DC NG tube, beginning clear liquids - 4/21: tolerated small amounts of clear liquids, one episode of vomiting overnight per discussion with RN - 4/22: trial of full liquids - 4/23: CT a/p shows stable fluid collection, likely non-contributory, and unchanged pSBO d/t adhesions - 4/24: continues to have bloating/emesis, although 2.4 L stoma output yesterday; feels SBP is resolving - 4/25: advancing to regular diet  today, patient thinks he can eat better with food from outside hospital - 4/26:was able to eat solid food but still feels bloated and occasional crampy abd pain - 4/28: TPN d/ced - 5/6: bloating with bilious vomiting; NPO;  resume TPN  Today:    Glucose  (goal of <150): 133 with CMET  Electrolytes: Na low but stable at 128,  Cl low -- pt refused NaCL tabs since 5/3 ; K 3.2 (kcl 10 meq IV x4 runs per MD); phos slightly elevated at 4.8; Mag wnl  Renal: scr up 1.20 (crcl~79); I/O 2450/4657  LFTs: AST/ALT up 129/347; alk phos down 195; Tbili wnl  TGs:  95 (4/22)  Prealbumin: 22.3 (4/22)  NUTRITIONAL GOALS  RD recs (4/29): Kcal:  2000-2200 (29-32 kcal/kg) Protein:  80-100 grams/day (1.2-1.4 grams/kg) Fluid:  2.0-2.2 L/day  Clinimix E 5/20 at a goal rate of 75 ml/hr + 20% fat emulsion at 20 ml/hr x 12 hr to provide: 90 g/day protein, 2169 Kcal/day.  - goal:  K > 4.0, Mg > 2.0 with ileus  PLAN       At 1800 today:  start electrolyte Free Clinimix 5/15 at 40 ml/hr d/t elevated phos  start 20% fat emulsion at 20 ml/hr for 12 hrs  TPN to contain standard multivitamins and trace elements  Decrease IV fluid to 110 ml/hr  start sSSI q8h  TPN lab panels on Mondays & Thursdays  F/u daily  Dia Sitter, PharmD, BCPS 08/21/2017 10:04 AM

## 2017-08-21 NOTE — Consult Note (Signed)
[facility] Nurse ostomy follow up Stoma type/location:  RUQ ileostomy  Stomal assessment/size: 1 1/4 inch Peristomal assessment: skin intact with normal color and texture, no hernia, sutures intact Treatment options for stomal/peristomal skin: barrier ring Output thin brown effluent Ostomy pouching: 1pc. convex Education provided: Patient well informed and performing own self care.  Has been having vomiting over weekend.  I changed his pouch, supplies at bedside, new pattern left at bedside.Use one piece convex, Lawson # [id number].

## 2017-08-21 NOTE — Progress Notes (Signed)
Family member asking about financial concerns and report that someone "Vicente Males and [employer] [place]"  has been assisting them with Medicaid forms. They would like to speak to someone. Call made to Financial Counselor; no answer. Voice mail left to please call me. Family advised of same. Donne Hazel, RN

## 2017-08-22 LAB — DIFFERENTIAL
BASOS ABS: 0 10*3/uL (ref 0.0–0.1)
Basophils Relative: 0 %
EOS ABS: 0.2 10*3/uL (ref 0.0–0.7)
Eosinophils Relative: 2 %
Lymphocytes Relative: 6 %
Lymphs Abs: 0.7 10*3/uL (ref 0.7–4.0)
Monocytes Absolute: 1.2 10*3/uL — ABNORMAL HIGH (ref 0.1–1.0)
Monocytes Relative: 10 %
NEUTROS PCT: 82 %
Neutro Abs: 9.3 10*3/uL — ABNORMAL HIGH (ref 1.7–7.7)

## 2017-08-22 LAB — COMPREHENSIVE METABOLIC PANEL
ALBUMIN: 3.3 g/dL — AB (ref 3.5–5.0)
ALK PHOS: 148 U/L — AB (ref 38–126)
ALT: 330 U/L — AB (ref 17–63)
AST: 98 U/L — ABNORMAL HIGH (ref 15–41)
Anion gap: 12 (ref 5–15)
BUN: 18 mg/dL (ref 6–20)
CHLORIDE: 84 mmol/L — AB (ref 101–111)
CO2: 30 mmol/L (ref 22–32)
Calcium: 8.8 mg/dL — ABNORMAL LOW (ref 8.9–10.3)
Creatinine, Ser: 0.88 mg/dL (ref 0.61–1.24)
GFR calc Af Amer: >60 mL/min (ref >60)
GFR calc non Af Amer: >60 mL/min (ref >60)
GLUCOSE: 134 mg/dL — AB (ref 65–99)
Potassium: 3.1 mmol/L — ABNORMAL LOW (ref 3.5–5.1)
SODIUM: 126 mmol/L — AB (ref 135–145)
TOTAL PROTEIN: 7.4 g/dL (ref 6.5–8.1)
Total Bilirubin: 0.7 mg/dL (ref 0.3–1.2)

## 2017-08-22 LAB — CBC
HCT: 36.7 % — ABNORMAL LOW (ref 39.0–52.0)
Hemoglobin: 12.9 g/dL — ABNORMAL LOW (ref 13.0–17.0)
MCH: 30.1 pg (ref 26.0–34.0)
MCHC: 35.1 g/dL (ref 30.0–36.0)
MCV: 85.5 fL (ref 78.0–100.0)
Platelets: 359 10*3/uL (ref 150–400)
RBC: 4.29 MIL/uL (ref 4.22–5.81)
RDW: 13.1 % (ref 11.5–15.5)
WBC: 11.4 10*3/uL — AB (ref 4.0–10.5)

## 2017-08-22 LAB — PREALBUMIN: PREALBUMIN: 15.6 mg/dL — AB (ref 18–38)

## 2017-08-22 LAB — TRIGLYCERIDES: TRIGLYCERIDES: 82 mg/dL (ref <150)

## 2017-08-22 LAB — GLUCOSE, CAPILLARY
GLUCOSE-CAPILLARY: 146 mg/dL — AB (ref 65–99)
Glucose-Capillary: 132 mg/dL — ABNORMAL HIGH (ref 65–99)

## 2017-08-22 LAB — PHOSPHORUS: PHOSPHORUS: 3.3 mg/dL (ref 2.5–4.6)

## 2017-08-22 LAB — MAGNESIUM: Magnesium: 2 mg/dL (ref 1.7–2.4)

## 2017-08-22 MED ORDER — POTASSIUM CHLORIDE 10 MEQ/100ML IV SOLN
10.0000 meq | INTRAVENOUS | Status: AC
  Administered 2017-08-22 (×6): 10 meq via INTRAVENOUS
  Filled 2017-08-22: qty 100

## 2017-08-22 MED ORDER — SODIUM CHLORIDE 0.9 % IV SOLN
INTRAVENOUS | Status: DC
  Administered 2017-08-23 – 2017-08-24 (×3): via INTRAVENOUS

## 2017-08-22 MED ORDER — TRACE MINERALS CR-CU-MN-SE-ZN 10-1000-500-60 MCG/ML IV SOLN
INTRAVENOUS | Status: AC
  Administered 2017-08-22: 18:00:00 via INTRAVENOUS
  Filled 2017-08-22: qty 1800

## 2017-08-22 MED ORDER — FAT EMULSION PLANT BASED 20 % IV EMUL
240.0000 mL | INTRAVENOUS | Status: AC
  Administered 2017-08-22: 240 mL via INTRAVENOUS
  Filled 2017-08-22: qty 250

## 2017-08-22 NOTE — Progress Notes (Signed)
Encouraged patient to get OOB and walk, as abdomen is distended and taut. Educated him an same and he understands he needs to walk and stay OOB in chair or walking. Donne Hazel, RN

## 2017-08-22 NOTE — Progress Notes (Signed)
33 Days Post-Op   Subjective/Chief Complaint: Bloating and pain worse but no further emesis.  Stoma high output- 2.6L in last 24hrs.   Objective: Vital signs in last 24 hours: Temp:  [98.5 F (36.9 C)-98.8 F (37.1 C)] 98.8 F (37.1 C) (05/07 0606) Pulse Rate:  [94-102] 97 (05/07 0606) Resp:  [17-18] 18 (05/07 0606) BP: (97-99)/(60-74) 99/60 (05/07 0606) SpO2:  [99 %-100 %] 99 % (05/07 0606) Last BM Date: 08/20/17  Intake/Output from previous day: 05/06 0701 - 05/07 0700 In: 5790.6 [P.O.:660; I.V.:3580.2; IV Piggyback:1550.4] Out: 3225 [Urine:600; Stool:2625] Intake/Output this shift: Total I/O In: 0  Out: 600 [Stool:600]  General appearance: alert and cooperative Resp: breathing comfortably Cardio: RRR GI: Abd soft, distended, non tender. Stoma pink, productive; semi liquid stool in appliance  Lab Results:  Recent Labs    08/21/17 0351 08/22/17 0519  WBC 14.8* 11.4*  HGB 14.0 12.9*  HCT 40.7 36.7*  PLT 424* 359   BMET Recent Labs    08/21/17 0351 08/22/17 0519  NA 128* 126*  K 3.2* 3.1*  CL 80* 84*  CO2 31 30  GLUCOSE 133* 134*  BUN 24* 18  CREATININE 1.20 0.88  CALCIUM 9.6 8.8*   PT/INR No results for input(s): LABPROT, INR in the last 72 hours. ABG No results for input(s): PHART, HCO3 in the last 72 hours.  Invalid input(s): PCO2, PO2  Studies/Results: Ct Abdomen Pelvis W Contrast  Result Date: 08/21/2017 CLINICAL DATA:  Bloating and abdominal pain. Approximately 1 month out from robotic associated LAR for rectal cancer. Loop ileostomy. EXAM: CT ABDOMEN AND PELVIS WITH CONTRAST TECHNIQUE: Multidetector CT imaging of the abdomen and pelvis was performed using the standard protocol following bolus administration of intravenous contrast. CONTRAST:  115mL OMNIPAQUE IOHEXOL 300 MG/ML  SOLN COMPARISON:  08/08/2017 FINDINGS: Lower chest: Unremarkable. Hepatobiliary: Similar appearance 13 mm hypoattenuating lesion in the lateral segment left liver. Mild  prominence of the intrahepatic bile ducts mainly in segment IV is not substantially changed. Other scattered tiny hypodensities in the right hepatic lobe are stable. There is no evidence for gallstones, gallbladder wall thickening, or pericholecystic fluid. Common duct is nondilated. Pancreas: No focal mass lesion. No dilatation of the main duct. No intraparenchymal cyst. No peripancreatic edema. Spleen: No splenomegaly. No focal mass lesion. Adrenals/Urinary Tract: No adrenal nodule or mass. Tiny hypoattenuating lesion in the interpolar right kidney is too small to characterize. Left kidney unremarkable. No evidence for hydroureter. The urinary bladder appears normal for the degree of distention. Stomach/Bowel: Stomach is mildly distended. Duodenum is nondistended. The degree of small bowel distension has progressed in the interval since prior study. Proximal small bowel loops could be measured up to 4 cm diameter. More distal unopacified small bowel loops measure up to 4.5 cm diameter. Fecalization of small bowel contents is evident in nondistended small bowel in the deep central pelvis. Small bowel in the central pelvis appears matted together as before and a small enhancing focus (image 80/series 2) in this region of clustered small bowel is indeterminate. Distal ileum tracking into the loop ileostomy is not well demonstrated given the marked small bowel dilatation. The appendix is visualized as normal features. There is some fluid in the right colon although the colon is largely decompressed. Vascular/Lymphatic: No abdominal aortic aneurysm. No abdominal aortic atherosclerotic calcification. No gastrohepatic or hepato duodenal ligament lymphadenopathy. Small para-aortic lymph nodes are similar to prior. 10 mm short axis left para-aortic lymph node measured on the previous exam is 9 mm today (  40/2). No pelvic sidewall lymphadenopathy. Reproductive: The prostate gland and seminal vesicles have normal imaging  features. Other: Rim enhancing fluid collection posterior to the distal colon near the anastomosis is not substantially changed in the interval measuring 4.3 x 2.0 cm today compared to 4.8 x 1.9 cm previously. Musculoskeletal: Bone windows reveal no worrisome lytic or sclerotic osseous lesions. IMPRESSION: 1. Some interval progression of the diffuse small bowel dilatation with distal small bowel loops now measuring up to 4.5 cm in diameter. Overall imaging features suggest obstruction although patient reportedly has high stoma output. Small bowel tapers into a matted cluster of small bowel in the central pelvis compatible with adhesions. Fecalization of small bowel contents in this region of clustered small bowel is consistent with decreased transit. 2. Although the distal ileum tracking into the ileostomy is not well visualized, there is no evidence for dilated small bowel tracking into the stoma. Colon is largely decompressed. 3. Similar appearance of the rim enhancing fluid collection seen just posterior to the rectum. Electronically Signed   By: Misty Stanley M.D.   On: 08/21/2017 15:45   Dg Abd 2 Views  Result Date: 08/20/2017 CLINICAL DATA:  Small bowel obstruction. History of LAR and diverting ileostomy for rectal cancer. EXAM: ABDOMEN - 2 VIEW COMPARISON:  08/01/2017 abdominal radiograph. 08/08/2017 CT abdomen/pelvis. FINDINGS: Ostomy device overlies the right abdomen. There is marked diffuse small bowel dilatation up to 7.8 cm diameter, increased from 6.2 cm on 08/01/2017 radiograph and 4.4 cm on 08/08/2017 CT topogram. Fluid levels throughout the dilated small bowel loops. No evidence of pneumatosis or pneumoperitoneum. No radiopaque nephrolithiasis. Clear lung bases. IMPRESSION: Marked diffuse small bowel dilatation with air-fluid levels, worsened since 08/08/2017 CT and 08/01/2017 radiograph, most compatible with worsening distal small bowel obstruction. Electronically Signed   By: Ilona Sorrel M.D.    On: 08/20/2017 14:11    Anti-infectives: Anti-infectives (From admission, onward)   Start     Dose/Rate Route Frequency Ordered Stop   07/31/17 1000  piperacillin-tazobactam (ZOSYN) IVPB 3.375 g  Status:  Discontinued     3.375 g 12.5 mL/hr over 240 Minutes Intravenous Every 8 hours 07/31/17 0834 08/03/17 0738   07/27/17 1800  ertapenem (INVANZ) 1 g in sodium chloride 0.9 % 100 mL IVPB  Status:  Discontinued     1 g 200 mL/hr over 30 Minutes Intravenous Every 24 hours 07/27/17 1632 07/31/17 0834   07/20/17 2000  cefoTEtan (CEFOTAN) 2 g in sodium chloride 0.9 % 100 mL IVPB  Status:  Discontinued     2 g 200 mL/hr over 30 Minutes Intravenous Every 12 hours 07/20/17 1346 07/20/17 1415   07/20/17 2000  cefoTEtan in Dextrose 5% (CEFOTAN) IVPB 2 g     2 g Intravenous Every 12 hours 07/20/17 1415 07/20/17 1930   07/20/17 0540  cefoTEtan in Dextrose 5% (CEFOTAN) IVPB 2 g     2 g Intravenous On call to O.R. 07/20/17 0540 07/20/17 0817      Assessment/Plan: s/p Procedure(s): XI ROBOTIC ASSISTED LOWER ANTERIOR RESECTION ERAS PATHWAY (N/A) DIVERTING LOOP COLOSTOMY (N/A) NPO CT scan looks unchanged Ambulate 5x/day Cont PO pain meds as tolerated Neurontin for foot neuropathy Hyponatremia: due to high output ileostomy, Cont 0.9 NS IV at 112ml/h, will discuss with pharmacy about increasing in TPN. recheck Na level tom am.   Hypokalemia:  replace via TPN Cont pepcid for ulcer prophylaxis PPx: Lov, SCDs   LOS: 33 days   Riyah Bardon C. 5/0/5397

## 2017-08-22 NOTE — Progress Notes (Signed)
PHARMACY - ADULT TOTAL PARENTERAL NUTRITION CONSULT NOTE   Pharmacy Consult for TPN Indication: prolonged ileus- resumed on 5/6  Patient Measurements: Height: _0  (188 cm) Weight: 148 lb 5.9 oz (67.3 kg) IBW/kg (Calculated) : 82.2 TPN AdjBW (KG): 73 Body mass index is 19.05 kg/m.   Insulin Requirements: 2 units in past 24 hours  Current Nutrition:  - ensure 237 mL BID stated on 5/4 --> not taking consistently taking - TPN resumed on 5/6  IVF: NS at 110 mL/hr  Central access: double lumen PAC TPN start date: 07/28/17  Recent Labs    08/21/17 0351 08/22/17 0519  NA 128* 126*  K 3.2* 3.1*  CL 80* 84*  CO2 31 30  GLUCOSE 133* 134*  BUN 24* 18  CREATININE 1.20 0.88  CALCIUM 9.6 8.8*  PHOS 4.8* 3.3  MG 2.0 2.0  ALBUMIN 3.7 3.3*  ALKPHOS 195* 148*  AST 129* 98*  ALT 347* 330*  BILITOT 0.9 0.7  TRIG  --  82  PREALBUMIN  --  15.6*    ASSESSMENT                                                                                                          HPI: 68 yoM with PMH colorectal cancer s/p chemo/rads and now s/p lower anterior resection with diverting loop ileostomy performed on 4/4. On 4/11 the patient developed post-op ileus and was made NPO. Also treating for potential anastomotic leak seen on CT as small presacral fluid collection. The patient's nutritional status has been poor for quite some time despite being on a diet until recently. TPN started on 4/12 - 4/27, resumed on 5/6.  Significant events:  - 4/15: aspiration of pelvic collection - 4/16: IV port clotted. TPN infusion d/ced at 1730 and resumed back at ~2200 - 4/20: DC NG tube, beginning clear liquids - 4/21: tolerated small amounts of clear liquids, one episode of vomiting overnight per discussion with RN - 4/22: trial of full liquids - 4/23: CT a/p shows stable fluid collection, likely non-contributory, and unchanged pSBO d/t adhesions - 4/24: continues to have bloating/emesis, although 2.4 L stoma  output yesterday; feels SBP is resolving - 4/25: advancing to regular diet today, patient thinks he can eat better with food from outside hospital - 4/26:was able to eat solid food but still feels bloated and occasional crampy abd pain - 4/28: TPN d/ced - 5/6: bloating with bilious vomiting; NPO;  resume TPN - 5/7: per discussion with Dr. Marcello Moores, plan is for pt to be discharged on TPN home until surgery can be performed again in about 6 weeks. Would like to stabilize electrolytes prior to discharge. Plan to discharge  In 3-4 days.   Today:    Glucose  (goal of <150): at target   Electrolytes: Na low but stable at 126, MD would like target Na to be at least 130   Cl low;K 3.1, low; phos is 3.3, WNL ; Mag wnl, CorrCa is WNL  Renal: scr down to 0.88 (crcl > 100); I/O 5790/3225  LFTs: AST/ALT up 98/330; alk phos down 148; Tbili wnl  TGs:  95 (4/22), 87 ( 5/7)   Prealbumin: 22.3 (4/22), 15.6 ( 5/7)   NUTRITIONAL GOALS                                                                                             RD recs (4/29): Kcal:  2000-2200 (29-32 kcal/kg) Protein:  80-100 grams/day (1.2-1.4 grams/kg) Fluid:  2.0-2.2 L/day  Clinimix E 5/20 at a goal rate of 75 ml/hr + 20% fat emulsion at 20 ml/hr x 12 hr to provide: 90 g/day protein, 2169 Kcal/day.  - goal:  K > 4.0, Mg > 2.0 with ileus  PLAN       Potassium chloride 10 meq IV x 6   At 1800 today:  Change to Clinimix with electrolytes  5/15 at 75 ml/hr as phosis now WNL. Will titrate to target rate as pt will likely need to be on cyclic TPN upon discharge   Continue 20% fat emulsion at 20 ml/hr for 12 hrs  TPN to contain standard multivitamins and trace elements  Decrease IV fluid to 85 ml/hr at 1800  start sSSI q8h  BMP, phosphorus and magnesium with AM labs   TPN lab panels on Mondays & Thursdays  F/u daily   Royetta Asal, PharmD, BCPS Pager [phone] 08/22/2017 9:22 AM

## 2017-08-22 NOTE — Progress Notes (Signed)
Call made at patient's request to Vicente Males in [place] @ [phone]; no answer; voicemail left. Donne Hazel, RN

## 2017-08-23 LAB — BASIC METABOLIC PANEL
Anion gap: 12 (ref 5–15)
BUN: 16 mg/dL (ref 6–20)
CO2: 29 mmol/L (ref 22–32)
CREATININE: 0.82 mg/dL (ref 0.61–1.24)
Calcium: 8.1 mg/dL — ABNORMAL LOW (ref 8.9–10.3)
Chloride: 87 mmol/L — ABNORMAL LOW (ref 101–111)
GFR calc non Af Amer: >60 mL/min (ref >60)
Glucose, Bld: 162 mg/dL — ABNORMAL HIGH (ref 65–99)
Potassium: 3.3 mmol/L — ABNORMAL LOW (ref 3.5–5.1)
SODIUM: 128 mmol/L — AB (ref 135–145)

## 2017-08-23 LAB — GLUCOSE, CAPILLARY
GLUCOSE-CAPILLARY: 122 mg/dL — AB (ref 65–99)
Glucose-Capillary: 124 mg/dL — ABNORMAL HIGH (ref 65–99)
Glucose-Capillary: 157 mg/dL — ABNORMAL HIGH (ref 65–99)
Glucose-Capillary: 159 mg/dL — ABNORMAL HIGH (ref 65–99)

## 2017-08-23 LAB — MAGNESIUM: MAGNESIUM: 2 mg/dL (ref 1.7–2.4)

## 2017-08-23 LAB — PHOSPHORUS: PHOSPHORUS: 4.1 mg/dL (ref 2.5–4.6)

## 2017-08-23 MED ORDER — FAT EMULSION PLANT BASED 20 % IV EMUL
240.0000 mL | INTRAVENOUS | Status: AC
  Administered 2017-08-23: 240 mL via INTRAVENOUS
  Filled 2017-08-23: qty 250

## 2017-08-23 MED ORDER — POTASSIUM CHLORIDE 10 MEQ/100ML IV SOLN
10.0000 meq | INTRAVENOUS | Status: AC
  Administered 2017-08-23 (×5): 10 meq via INTRAVENOUS
  Filled 2017-08-23 (×5): qty 100

## 2017-08-23 MED ORDER — TRACE MINERALS CR-CU-MN-SE-ZN 10-1000-500-60 MCG/ML IV SOLN
INTRAVENOUS | Status: AC
  Administered 2017-08-23: 18:00:00 via INTRAVENOUS
  Filled 2017-08-23: qty 1800

## 2017-08-23 NOTE — Progress Notes (Signed)
34 Days Post-Op   Subjective/Chief Complaint: Bloating and pain better with NPO.  Very high stoma output- 4.3L in last 24hrs. Ambulated once yesterday.    Objective: Vital signs in last 24 hours: Temp:  [98.6 F (37 C)-98.8 F (37.1 C)] 98.7 F (37.1 C) (05/08 0545) Pulse Rate:  [91-101] 91 (05/08 0545) Resp:  [15-16] 16 (05/08 0545) BP: (90-107)/(69-78) 90/69 (05/08 0545) SpO2:  [98 %-100 %] 99 % (05/08 0545) Last BM Date: 08/22/17  Intake/Output from previous day: 05/07 0701 - 05/08 0700 In: 1626 [P.O.:60; I.V.:966; IV Piggyback:600] Out: 4925 [Urine:575; Stool:4350] Intake/Output this shift: No intake/output data recorded.  General appearance: alert and cooperative Resp: breathing comfortably Cardio: RRR GI: Abd soft, distended, non tender. Stoma pink, productive; semi liquid stool in appliance  Lab Results:  Recent Labs    08/21/17 0351 08/22/17 0519  WBC 14.8* 11.4*  HGB 14.0 12.9*  HCT 40.7 36.7*  PLT 424* 359   BMET Recent Labs    08/22/17 0519 08/23/17 0350  NA 126* 128*  K 3.1* 3.3*  CL 84* 87*  CO2 30 29  GLUCOSE 134* 162*  BUN 18 16  CREATININE 0.88 0.82  CALCIUM 8.8* 8.1*   PT/INR No results for input(s): LABPROT, INR in the last 72 hours. ABG No results for input(s): PHART, HCO3 in the last 72 hours.  Invalid input(s): PCO2, PO2  Studies/Results: Ct Abdomen Pelvis W Contrast  Result Date: 08/21/2017 CLINICAL DATA:  Bloating and abdominal pain. Approximately 1 month out from robotic associated LAR for rectal cancer. Loop ileostomy. EXAM: CT ABDOMEN AND PELVIS WITH CONTRAST TECHNIQUE: Multidetector CT imaging of the abdomen and pelvis was performed using the standard protocol following bolus administration of intravenous contrast. CONTRAST:  116mL OMNIPAQUE IOHEXOL 300 MG/ML  SOLN COMPARISON:  08/08/2017 FINDINGS: Lower chest: Unremarkable. Hepatobiliary: Similar appearance 13 mm hypoattenuating lesion in the lateral segment left liver. Mild  prominence of the intrahepatic bile ducts mainly in segment IV is not substantially changed. Other scattered tiny hypodensities in the right hepatic lobe are stable. There is no evidence for gallstones, gallbladder wall thickening, or pericholecystic fluid. Common duct is nondilated. Pancreas: No focal mass lesion. No dilatation of the main duct. No intraparenchymal cyst. No peripancreatic edema. Spleen: No splenomegaly. No focal mass lesion. Adrenals/Urinary Tract: No adrenal nodule or mass. Tiny hypoattenuating lesion in the interpolar right kidney is too small to characterize. Left kidney unremarkable. No evidence for hydroureter. The urinary bladder appears normal for the degree of distention. Stomach/Bowel: Stomach is mildly distended. Duodenum is nondistended. The degree of small bowel distension has progressed in the interval since prior study. Proximal small bowel loops could be measured up to 4 cm diameter. More distal unopacified small bowel loops measure up to 4.5 cm diameter. Fecalization of small bowel contents is evident in nondistended small bowel in the deep central pelvis. Small bowel in the central pelvis appears matted together as before and a small enhancing focus (image 80/series 2) in this region of clustered small bowel is indeterminate. Distal ileum tracking into the loop ileostomy is not well demonstrated given the marked small bowel dilatation. The appendix is visualized as normal features. There is some fluid in the right colon although the colon is largely decompressed. Vascular/Lymphatic: No abdominal aortic aneurysm. No abdominal aortic atherosclerotic calcification. No gastrohepatic or hepato duodenal ligament lymphadenopathy. Small para-aortic lymph nodes are similar to prior. 10 mm short axis left para-aortic lymph node measured on the previous exam is 9 mm today (40/2).  No pelvic sidewall lymphadenopathy. Reproductive: The prostate gland and seminal vesicles have normal imaging  features. Other: Rim enhancing fluid collection posterior to the distal colon near the anastomosis is not substantially changed in the interval measuring 4.3 x 2.0 cm today compared to 4.8 x 1.9 cm previously. Musculoskeletal: Bone windows reveal no worrisome lytic or sclerotic osseous lesions. IMPRESSION: 1. Some interval progression of the diffuse small bowel dilatation with distal small bowel loops now measuring up to 4.5 cm in diameter. Overall imaging features suggest obstruction although patient reportedly has high stoma output. Small bowel tapers into a matted cluster of small bowel in the central pelvis compatible with adhesions. Fecalization of small bowel contents in this region of clustered small bowel is consistent with decreased transit. 2. Although the distal ileum tracking into the ileostomy is not well visualized, there is no evidence for dilated small bowel tracking into the stoma. Colon is largely decompressed. 3. Similar appearance of the rim enhancing fluid collection seen just posterior to the rectum. Electronically Signed   By: Misty Stanley M.D.   On: 08/21/2017 15:45    Anti-infectives: Anti-infectives (From admission, onward)   Start     Dose/Rate Route Frequency Ordered Stop   07/31/17 1000  piperacillin-tazobactam (ZOSYN) IVPB 3.375 g  Status:  Discontinued     3.375 g 12.5 mL/hr over 240 Minutes Intravenous Every 8 hours 07/31/17 0834 08/03/17 0738   07/27/17 1800  ertapenem (INVANZ) 1 g in sodium chloride 0.9 % 100 mL IVPB  Status:  Discontinued     1 g 200 mL/hr over 30 Minutes Intravenous Every 24 hours 07/27/17 1632 07/31/17 0834   07/20/17 2000  cefoTEtan (CEFOTAN) 2 g in sodium chloride 0.9 % 100 mL IVPB  Status:  Discontinued     2 g 200 mL/hr over 30 Minutes Intravenous Every 12 hours 07/20/17 1346 07/20/17 1415   07/20/17 2000  cefoTEtan in Dextrose 5% (CEFOTAN) IVPB 2 g     2 g Intravenous Every 12 hours 07/20/17 1415 07/20/17 1930   07/20/17 0540  cefoTEtan in  Dextrose 5% (CEFOTAN) IVPB 2 g     2 g Intravenous On call to O.R. 07/20/17 0540 07/20/17 0817      Assessment/Plan: s/p Procedure(s): XI ROBOTIC ASSISTED LOWER ANTERIOR RESECTION ERAS PATHWAY (N/A) DIVERTING LOOP COLOSTOMY (N/A) NPO CT scan looks unchanged Goal: ambulate 5x/day Cont PO pain meds as tolerated Neurontin for foot neuropathy Hyponatremia: due to high output ileostomy, Cont 0.9 NS IV, have discussed with pharmacy about increasing Na in TPN. recheck Na level tom am.   Hypokalemia:  Pharm to replace  Cont pepcid for ulcer prophylaxis PPx: Lov, SCDs   LOS: 34 days   Callaway Hailes C. 05/27/5186

## 2017-08-23 NOTE — Progress Notes (Signed)
[name] will provide [facility] services for pt for home TNA at DC as ordered.  [facility] Coord[facility] will provide in hospital TNA teaching with to support successful transition home.  If patient discharges after hours, please call [phone].   Jacob Galvan 08/23/2017, 12:42 AM

## 2017-08-23 NOTE — Progress Notes (Signed)
PHARMACY - ADULT TOTAL PARENTERAL NUTRITION CONSULT NOTE   Pharmacy Consult for TPN Indication: prolonged ileus- resumed on 5/6  Patient Measurements: Height: _0  (188 cm) Weight: 148 lb 5.9 oz (67.3 kg) IBW/kg (Calculated) : 82.2 TPN AdjBW (KG): 73 Body mass index is 19.05 kg/m.   Insulin Requirements: 5 units in past 24 hours  Current Nutrition:  - ensure 237 mL BID stated on 5/4 --> not taking consistently taking - TPN resumed on 5/6  IVF: NS at 85 mL/hr  Central access: double lumen PAC TPN start date: 07/28/17  Recent Labs    08/21/17 0351 08/22/17 0519 08/23/17 0350  NA 128* 126* 128*  K 3.2* 3.1* 3.3*  CL 80* 84* 87*  CO2 _1 GLUCOSE 133* 134* 162*  BUN 24* 18 16  CREATININE 1.20 0.88 0.82  CALCIUM 9.6 8.8* 8.1*  PHOS 4.8* 3.3 4.1  MG 2.0 2.0 2.0  ALBUMIN 3.7 3.3*  --   ALKPHOS 195* 148*  --   AST 129* 98*  --   ALT 347* 330*  --   BILITOT 0.9 0.7  --   TRIG  --  82  --   PREALBUMIN  --  15.6*  --     ASSESSMENT                                                                                                          HPI: 32 yoM with PMH colorectal cancer s/p chemo/rads and now s/p lower anterior resection with diverting loop ileostomy performed on 4/4. On 4/11 the patient developed post-op ileus and was made NPO. Also treating for potential anastomotic leak seen on CT as small presacral fluid collection. The patient's nutritional status has been poor for quite some time despite being on a diet until recently. TPN started on 4/12 - 4/27, resumed on 5/6.  Significant events:  - 4/15: aspiration of pelvic collection - 4/16: IV port clotted. TPN infusion d/ced at 1730 and resumed back at ~2200 - 4/20: DC NG tube, beginning clear liquids - 4/21: tolerated small amounts of clear liquids, one episode of vomiting overnight per discussion with RN - 4/22: trial of full liquids - 4/23: CT a/p shows stable fluid collection, likely non-contributory, and  unchanged pSBO d/t adhesions - 4/24: continues to have bloating/emesis, although 2.4 L stoma output yesterday; feels SBP is resolving - 4/25: advancing to regular diet today, patient thinks he can eat better with food from outside hospital - 4/26:was able to eat solid food but still feels bloated and occasional crampy abd pain - 4/28: TPN d/ced - 5/6: bloating with bilious vomiting; NPO;  resume TPN - 5/7: per discussion with Dr. Marcello Moores, plan is for pt to be discharged on TPN home until surgery can be performed again in about 6 weeks. Would like to stabilize electrolytes prior to discharge. Plan to discharge  In 3-4 days.   Today:    Glucose  (goal of <150): WNL   Electrolytes: Na low but improved to at 128, MD would like target Na  to be at least 130   Cl low;K 3.3, low; phos is 4.1, WNL ; Mag wnl, CorrCa is WNL  Renal: scr down to 0.82 (crcl > 100), stable  LFTs: AST/ALT up 98/330; alk phos down 148; Tbili wnl  TGs:  95 (4/22), 87 ( 5/7)   Prealbumin: 22.3 (4/22), 15.6 ( 5/7)   NUTRITIONAL GOALS                                                                                             RD recs (4/29): Kcal:  2000-2200 (29-32 kcal/kg) Protein:  80-100 grams/day (1.2-1.4 grams/kg) Fluid:  2.0-2.2 L/day  Clinimix E 5/20 at a goal rate of 75 ml/hr + 20% fat emulsion at 20 ml/hr x 12 hr to provide: 90 g/day protein, 2169 Kcal/day.  - goal:  K > 4.0, Mg > 2.0 with ileus  PLAN       Potassium chloride 10 meq IV x 6   At 1800 today:  Continue Clinimix with electrolytes  5/15 at 75 ml/hr.   If continues to be stable on above mix for another 24 hours will look to try to transition to cyclic TPN for target upon discharge   Continue 20% fat emulsion at 20 ml/hr for 12 hrs  TPN to contain standard multivitamins and trace elements  Continue IV fluid to 85 ml/hr at 1800  Continue sSSI q8h  TPN lab panels on Mondays & Thursdays  F/u daily   Royetta Asal, PharmD, BCPS Pager  [phone] 08/23/2017 10:26 AM

## 2017-08-24 ENCOUNTER — Inpatient Hospital Stay: Payer: Medicaid Other | Admitting: Nurse Practitioner

## 2017-08-24 LAB — COMPREHENSIVE METABOLIC PANEL
ALBUMIN: 3.1 g/dL — AB (ref 3.5–5.0)
ALK PHOS: 145 U/L — AB (ref 38–126)
ALT: 185 U/L — ABNORMAL HIGH (ref 17–63)
ANION GAP: 10 (ref 5–15)
AST: 55 U/L — ABNORMAL HIGH (ref 15–41)
BUN: 18 mg/dL (ref 6–20)
CALCIUM: 8.6 mg/dL — AB (ref 8.9–10.3)
CHLORIDE: 87 mmol/L — AB (ref 101–111)
CO2: 28 mmol/L (ref 22–32)
Creatinine, Ser: 0.91 mg/dL (ref 0.61–1.24)
GFR calc Af Amer: >60 mL/min (ref >60)
GFR calc non Af Amer: >60 mL/min (ref >60)
GLUCOSE: 127 mg/dL — AB (ref 65–99)
Potassium: 3.7 mmol/L (ref 3.5–5.1)
SODIUM: 125 mmol/L — AB (ref 135–145)
Total Bilirubin: 0.5 mg/dL (ref 0.3–1.2)
Total Protein: 7.2 g/dL (ref 6.5–8.1)

## 2017-08-24 LAB — GLUCOSE, CAPILLARY
GLUCOSE-CAPILLARY: 120 mg/dL — AB (ref 65–99)
Glucose-Capillary: 122 mg/dL — ABNORMAL HIGH (ref 65–99)

## 2017-08-24 LAB — PHOSPHORUS: Phosphorus: 3.9 mg/dL (ref 2.5–4.6)

## 2017-08-24 LAB — MAGNESIUM: Magnesium: 2 mg/dL (ref 1.7–2.4)

## 2017-08-24 MED ORDER — FAT EMULSION PLANT BASED 20 % IV EMUL
240.0000 mL | INTRAVENOUS | Status: AC
  Administered 2017-08-24: 240 mL via INTRAVENOUS
  Filled 2017-08-24: qty 250

## 2017-08-24 MED ORDER — INSULIN ASPART 100 UNIT/ML ~~LOC~~ SOLN
0.0000 [IU] | SUBCUTANEOUS | Status: DC
  Administered 2017-08-24 – 2017-08-28 (×7): 1 [IU] via SUBCUTANEOUS

## 2017-08-24 MED ORDER — POTASSIUM CHLORIDE 10 MEQ/100ML IV SOLN
10.0000 meq | INTRAVENOUS | Status: AC
  Administered 2017-08-24 (×2): 10 meq via INTRAVENOUS
  Filled 2017-08-24 (×2): qty 100

## 2017-08-24 MED ORDER — TRACE MINERALS CR-CU-MN-SE-ZN 10-1000-500-60 MCG/ML IV SOLN
INTRAVENOUS | Status: AC
  Administered 2017-08-24: 18:00:00 via INTRAVENOUS
  Filled 2017-08-24: qty 1800

## 2017-08-24 NOTE — Progress Notes (Signed)
Patient experienced episodes of stool incontinence x 2. Dr Marcello Moores notified.

## 2017-08-24 NOTE — Progress Notes (Signed)
PHARMACY - ADULT TOTAL PARENTERAL NUTRITION CONSULT NOTE   Pharmacy Consult for TPN Indication: prolonged ileus- resumed on 5/6  Patient Measurements: Height: _0  (188 cm) Weight: 148 lb 5.9 oz (67.3 kg) IBW/kg (Calculated) : 82.2 TPN AdjBW (KG): 73 Body mass index is 19.05 kg/m.   Insulin Requirements: 4 units in past 24 hours  Current Nutrition:  - ensure 237 mL BID stated on 5/4 --> not taking consistently taking - TPN resumed on 5/6  IVF: NS at 85 mL/hr  Central access: double lumen PAC TPN start date: 07/28/17  Recent Labs    08/22/17 0519 08/23/17 0350 08/24/17 0507  NA 126* 128* 125*  K 3.1* 3.3* 3.7  CL 84* 87* 87*  CO2 _1 GLUCOSE 134* 162* 127*  BUN _2 CREATININE 0.88 0.82 0.91  CALCIUM 8.8* 8.1* 8.6*  PHOS 3.3 4.1 3.9  MG 2.0 2.0 2.0  ALBUMIN 3.3*  --  3.1*  ALKPHOS 148*  --  145*  AST 98*  --  55*  ALT 330*  --  185*  BILITOT 0.7  --  0.5  TRIG 82  --   --   PREALBUMIN 15.6*  --   --     ASSESSMENT                                                                                                          HPI: 89 yoM with PMH colorectal cancer s/p chemo/rads and now s/p lower anterior resection with diverting loop ileostomy performed on 4/4. On 4/11 the patient developed post-op ileus and was made NPO. Also treating for potential anastomotic leak seen on CT as small presacral fluid collection. The patient's nutritional status has been poor for quite some time despite being on a diet until recently. TPN started on 4/12 - 4/27, resumed on 5/6.  Significant events:  - 4/15: aspiration of pelvic collection - 4/16: IV port clotted. TPN infusion d/ced at 1730 and resumed back at ~2200 - 4/20: DC NG tube, beginning clear liquids - 4/21: tolerated small amounts of clear liquids, one episode of vomiting overnight per discussion with RN - 4/22: trial of full liquids - 4/23: CT a/p shows stable fluid collection, likely non-contributory, and  unchanged pSBO d/t adhesions - 4/24: continues to have bloating/emesis, although 2.4 L stoma output yesterday; feels SBP is resolving - 4/25: advancing to regular diet today, patient thinks he can eat better with food from outside hospital - 4/26:was able to eat solid food but still feels bloated and occasional crampy abd pain - 4/28: TPN d/ced - 5/6: bloating with bilious vomiting; NPO;  resume TPN - 5/7: per discussion with Dr. Marcello Moores, plan is for pt to be discharged on TPN home until surgery can be performed again in about 6 weeks. Would like to stabilize electrolytes prior to discharge. Plan to discharge  In 3-4 days.   Today:    Glucose  (goal of <150): WNL   Electrolytes: Na low  at 125, MD would like target Na to be  at least 130   Cl low;K 3.7; phos is 3.9, WNL ; Mag wnl, CorrCa is WNL  Renal: scr down to 0.82 (crcl > 100), stable  LFTs: AST/ALT elevated at 55/185 but trending down; alk phos at145; Tbili wnl  TGs:  95 (4/22), 87 ( 5/7)   Prealbumin: 22.3 (4/22), 15.6 ( 5/7)   NUTRITIONAL GOALS                                                                                             RD recs (4/29): Kcal:  2000-2200 (29-32 kcal/kg) Protein:  80-100 grams/day (1.2-1.4 grams/kg) Fluid:  2.0-2.2 L/day  Clinimix E 5/20 at a goal rate of 75 ml/hr + 20% fat emulsion at 20 ml/hr x 12 hr to provide: 90 g/day protein, 2169 Kcal/day.  - goal:  K > 4.0, Mg > 2.0 with ileus  PLAN       Potassium chloride 10 meq IV x 2   At 1800 today:  Start 18 hour cyclic TPN Clinimix with electrolytes  5/15 at 50 ml/hr during first hour, followed by 106 ml/hr for 16 hours then 50 ml/hr for final hour ml/hr.   Add NaCl IV to TPN to make TPN equivalent to NS conc.   Continue 20% fat emulsion at 20 ml/hr for 12 hrs  TPN to contain standard multivitamins and trace elements  MD decreased IV fluid to 50 ml/hr  Change CBG times to 2 hours after cyclic TPN start, 1 hour after cyclic TPN stops,  one during cyclic TPN infusion and once more while off   BMP, magnesium, phosphorus with AM labs   TPN lab panels on Mondays & Thursdays  F/u daily   Royetta Asal, PharmD, BCPS Pager [phone] 08/24/2017 11:09 AM

## 2017-08-24 NOTE — Progress Notes (Signed)
35 Days Post-Op   Subjective/Chief Complaint: Bloating and pain better with NPO.  Very high stoma output- 4.1L in last 24hrs. Ambulated twice yesterday.    Objective: Vital signs in last 24 hours: Temp:  [98.1 F (36.7 C)-99.8 F (37.7 C)] 98.1 F (36.7 C) (05/09 0606) Pulse Rate:  [84-103] 84 (05/09 0606) Resp:  [14-18] 16 (05/09 0606) BP: (94-104)/(64-70) 94/69 (05/09 0606) SpO2:  [98 %-99 %] 99 % (05/09 0606) Last BM Date: 08/24/17  Intake/Output from previous day: 05/08 0701 - 05/09 0700 In: 5346.2 [P.O.:301; I.V.:4745.2; IV Piggyback:300] Out: 5300 [Urine:1150; Stool:4150] Intake/Output this shift: No intake/output data recorded.  General appearance: alert and cooperative Resp: breathing comfortably Cardio: RRR GI: Abd soft, distended, non tender. Stoma pink, productive; semi liquid stool in appliance  Lab Results:  Recent Labs    08/22/17 0519  WBC 11.4*  HGB 12.9*  HCT 36.7*  PLT 359   BMET Recent Labs    08/23/17 0350 08/24/17 0507  NA 128* 125*  K 3.3* 3.7  CL 87* 87*  CO2 29 28  GLUCOSE 162* 127*  BUN 16 18  CREATININE 0.82 0.91  CALCIUM 8.1* 8.6*   PT/INR No results for input(s): LABPROT, INR in the last 72 hours. ABG No results for input(s): PHART, HCO3 in the last 72 hours.  Invalid input(s): PCO2, PO2  Studies/Results: No results found.  Anti-infectives: Anti-infectives (From admission, onward)   Start     Dose/Rate Route Frequency Ordered Stop   07/31/17 1000  piperacillin-tazobactam (ZOSYN) IVPB 3.375 g  Status:  Discontinued     3.375 g 12.5 mL/hr over 240 Minutes Intravenous Every 8 hours 07/31/17 0834 08/03/17 0738   07/27/17 1800  ertapenem (INVANZ) 1 g in sodium chloride 0.9 % 100 mL IVPB  Status:  Discontinued     1 g 200 mL/hr over 30 Minutes Intravenous Every 24 hours 07/27/17 1632 07/31/17 0834   07/20/17 2000  cefoTEtan (CEFOTAN) 2 g in sodium chloride 0.9 % 100 mL IVPB  Status:  Discontinued     2 g 200 mL/hr over 30  Minutes Intravenous Every 12 hours 07/20/17 1346 07/20/17 1415   07/20/17 2000  cefoTEtan in Dextrose 5% (CEFOTAN) IVPB 2 g     2 g Intravenous Every 12 hours 07/20/17 1415 07/20/17 1930   07/20/17 0540  cefoTEtan in Dextrose 5% (CEFOTAN) IVPB 2 g     2 g Intravenous On call to O.R. 07/20/17 0540 07/20/17 0817      Assessment/Plan: s/p Procedure(s): XI ROBOTIC ASSISTED LOWER ANTERIOR RESECTION ERAS PATHWAY (N/A) DIVERTING LOOP COLOSTOMY (N/A) NPO CT scan looks unchanged Goal: ambulate 5x/day Cont PO pain meds as tolerated Neurontin for foot neuropathy Hyponatremia: due to high output ileostomy, Cont 0.9 NS IV, will again discuss with pharmacy about increasing Na in TPN. recheck Na level tom am.   Hypokalemia:  Normal today Cont pepcid for ulcer prophylaxis PPx: Lov, SCDs  Home health consult for home TPN    LOS: 35 days   Yuko Coventry C. 05/28/7987

## 2017-08-25 ENCOUNTER — Encounter (HOSPITAL_COMMUNITY): Payer: Self-pay

## 2017-08-25 LAB — PHOSPHORUS: Phosphorus: 4.3 mg/dL (ref 2.5–4.6)

## 2017-08-25 LAB — BASIC METABOLIC PANEL WITH GFR
Anion gap: 11 (ref 5–15)
BUN: 21 mg/dL — ABNORMAL HIGH (ref 6–20)
CO2: 27 mmol/L (ref 22–32)
Calcium: 8.7 mg/dL — ABNORMAL LOW (ref 8.9–10.3)
Chloride: 92 mmol/L — ABNORMAL LOW (ref 101–111)
Creatinine, Ser: 0.83 mg/dL (ref 0.61–1.24)
GFR calc Af Amer: >60 mL/min
GFR calc non Af Amer: >60 mL/min
Glucose, Bld: 138 mg/dL — ABNORMAL HIGH (ref 65–99)
Potassium: 3.7 mmol/L (ref 3.5–5.1)
Sodium: 130 mmol/L — ABNORMAL LOW (ref 135–145)

## 2017-08-25 LAB — GLUCOSE, CAPILLARY
GLUCOSE-CAPILLARY: 139 mg/dL — AB (ref 65–99)
GLUCOSE-CAPILLARY: 136 mg/dL — AB (ref 65–99)
Glucose-Capillary: 143 mg/dL — ABNORMAL HIGH (ref 65–99)
Glucose-Capillary: 134 mg/dL — ABNORMAL HIGH (ref 65–99)

## 2017-08-25 LAB — MAGNESIUM: Magnesium: 2.1 mg/dL (ref 1.7–2.4)

## 2017-08-25 MED ORDER — TRACE MINERALS CR-CU-MN-SE-ZN 10-1000-500-60 MCG/ML IV SOLN
INTRAVENOUS | Status: AC
  Administered 2017-08-25: 18:00:00 via INTRAVENOUS
  Filled 2017-08-25: qty 1800

## 2017-08-25 MED ORDER — FAMOTIDINE 20 MG PO TABS
40.0000 mg | ORAL_TABLET | Freq: Every day | ORAL | Status: DC
  Administered 2017-08-25 – 2017-08-29 (×5): 40 mg via ORAL
  Filled 2017-08-25 (×5): qty 2

## 2017-08-25 MED ORDER — FAT EMULSION PLANT BASED 20 % IV EMUL
240.0000 mL | INTRAVENOUS | Status: AC
  Administered 2017-08-25: 240 mL via INTRAVENOUS
  Filled 2017-08-25: qty 250

## 2017-08-25 MED ORDER — POTASSIUM CHLORIDE 10 MEQ/100ML IV SOLN
10.0000 meq | INTRAVENOUS | Status: AC
  Administered 2017-08-25 (×3): 10 meq via INTRAVENOUS
  Filled 2017-08-25 (×2): qty 100

## 2017-08-25 MED ORDER — SODIUM CHLORIDE 0.9 % IV BOLUS
1000.0000 mL | INTRAVENOUS | Status: DC
  Administered 2017-08-25 – 2017-08-27 (×3): 1000 mL via INTRAVENOUS

## 2017-08-25 NOTE — Progress Notes (Addendum)
Discharge planning for [facility] with TPN arranged with [facility]. Orders placed, contacted [facility] and they are aware of potential d/c on Sat or Sun. Long discussion with mother and patient about d/c plan. They have lots of questions about Medicaid and disability, encouraged them to f/u with financial counselor. Requesting letter for disability through work, instructed them to contact surgeons office. Spent time discussing what home health involves and how they would visit. Patient requesting a cane for home use. [facility] to deliver to the room. [phone]

## 2017-08-25 NOTE — Progress Notes (Signed)
36 Days Post-Op   Subjective/Chief Complaint:  stoma output slowing - 2.9L in last 24hrs. Ambulated some yesterday.    Objective: Vital signs in last 24 hours: Temp:  [98.4 F (36.9 C)-98.8 F (37.1 C)] 98.4 F (36.9 C) (05/10 0502) Pulse Rate:  [90-96] 91 (05/10 0502) Resp:  [14] 14 (05/10 0502) BP: (88-97)/(59-68) 92/65 (05/10 0502) SpO2:  [98 %-100 %] 100 % (05/10 0502) Weight:  [64 kg (141 lb 1.5 oz)] 64 kg (141 lb 1.5 oz) (05/10 0502) Last BM Date: 08/24/17  Intake/Output from previous day: 05/09 0701 - 05/10 0700 In: 3282.5 [P.O.:120; I.V.:3062.5; IV Piggyback:100] Out: 3900 [Urine:950; Stool:2950] Intake/Output this shift: No intake/output data recorded.  General appearance: alert and cooperative Resp: breathing comfortably Cardio: RRR GI: Abd soft, less distended, non tender. Stoma pink, productive; liquid stool in appliance  Lab Results:  No results for input(s): WBC, HGB, HCT, PLT in the last 72 hours. BMET Recent Labs    08/24/17 0507 08/25/17 0422  NA 125* 130*  K 3.7 3.7  CL 87* 92*  CO2 28 27  GLUCOSE 127* 138*  BUN 18 21*  CREATININE 0.91 0.83  CALCIUM 8.6* 8.7*   PT/INR No results for input(s): LABPROT, INR in the last 72 hours. ABG No results for input(s): PHART, HCO3 in the last 72 hours.  Invalid input(s): PCO2, PO2  Studies/Results: No results found.  Anti-infectives: Anti-infectives (From admission, onward)   Start     Dose/Rate Route Frequency Ordered Stop   07/31/17 1000  piperacillin-tazobactam (ZOSYN) IVPB 3.375 g  Status:  Discontinued     3.375 g 12.5 mL/hr over 240 Minutes Intravenous Every 8 hours 07/31/17 0834 08/03/17 0738   07/27/17 1800  ertapenem (INVANZ) 1 g in sodium chloride 0.9 % 100 mL IVPB  Status:  Discontinued     1 g 200 mL/hr over 30 Minutes Intravenous Every 24 hours 07/27/17 1632 07/31/17 0834   07/20/17 2000  cefoTEtan (CEFOTAN) 2 g in sodium chloride 0.9 % 100 mL IVPB  Status:  Discontinued     2 g 200  mL/hr over 30 Minutes Intravenous Every 12 hours 07/20/17 1346 07/20/17 1415   07/20/17 2000  cefoTEtan in Dextrose 5% (CEFOTAN) IVPB 2 g     2 g Intravenous Every 12 hours 07/20/17 1415 07/20/17 1930   07/20/17 0540  cefoTEtan in Dextrose 5% (CEFOTAN) IVPB 2 g     2 g Intravenous On call to O.R. 07/20/17 0540 07/20/17 0817      Assessment/Plan: s/p Procedure(s): XI ROBOTIC ASSISTED LOWER ANTERIOR RESECTION ERAS PATHWAY (N/A) DIVERTING LOOP COLOSTOMY (N/A) NPO CT scan looks unchanged Goal: ambulate 5x/day Cont PO pain meds as tolerated Neurontin for foot neuropathy Hyponatremia: due to high output ileostomy, 0.9 NS IV d/c'd, will switch to 0.9 NS 1L bolus after completion of TPN. Na coming up. recheck Na level tom am.   Hypokalemia:  Normal today and stable Cont pepcid for ulcer prophylaxis PPx: Lov, SCDs  Home health consult for home TPN    LOS: 36 days   Jacob Victor C. 12/15/5619

## 2017-08-25 NOTE — Progress Notes (Signed)
PHARMACY - ADULT TOTAL PARENTERAL NUTRITION CONSULT NOTE   Pharmacy Consult for TPN Indication: prolonged ileus- resumed on 5/6  Patient Measurements: Height: 6' 2"  (188 cm) Weight: 141 lb 1.5 oz (64 kg) IBW/kg (Calculated) : 82.2 TPN AdjBW (KG): 73 Body mass index is 18.12 kg/m.   Insulin Requirements: 2 units in past 24 hours  Current Nutrition:  - ensure 237 mL BID stated on 5/4 --> not taking consistently taking - TPN resumed on 5/6  IVF: NS  1 L over 1 hour H47Q after cyclic TPN is done at 2595  Central access: double lumen PAC TPN start date: 07/28/17  Recent Labs    08/24/17 0507 08/25/17 0422  NA 125* 130*  K 3.7 3.7  CL 87* 92*  CO2 28 27  GLUCOSE 127* 138*  BUN 18 21*  CREATININE 0.91 0.83  CALCIUM 8.6* 8.7*  PHOS 3.9 4.3  MG 2.0 2.1  ALBUMIN 3.1*  --   ALKPHOS 145*  --   AST 55*  --   ALT 185*  --   BILITOT 0.5  --     ASSESSMENT                                                                                                          HPI: 41 yoM with PMH colorectal cancer s/p chemo/rads and now s/p lower anterior resection with diverting loop ileostomy performed on 4/4. On 4/11 the patient developed post-op ileus and was made NPO. Also treating for potential anastomotic leak seen on CT as small presacral fluid collection. The patient's nutritional status has been poor for quite some time despite being on a diet until recently. TPN started on 4/12 - 4/27, resumed on 5/6.  Significant events:  - 4/15: aspiration of pelvic collection - 4/16: IV port clotted. TPN infusion d/ced at 1730 and resumed back at ~2200 - 4/20: DC NG tube, beginning clear liquids - 4/21: tolerated small amounts of clear liquids, one episode of vomiting overnight per discussion with RN - 4/22: trial of full liquids - 4/23: CT a/p shows stable fluid collection, likely non-contributory, and unchanged pSBO d/t adhesions - 4/24: continues to have bloating/emesis, although 2.4 L stoma  output yesterday; feels SBP is resolving - 4/25: advancing to regular diet today, patient thinks he can eat better with food from outside hospital - 4/26:was able to eat solid food but still feels bloated and occasional crampy abd pain - 4/28: TPN d/ced - 5/6: bloating with bilious vomiting; NPO;  resume TPN - 5/7: per discussion with Dr. Marcello Moores, plan is for pt to be discharged on TPN home until surgery can be performed again in about 6 weeks. Would like to stabilize electrolytes prior to discharge. Plan to discharge  In 3-4 days.  - 5/9 Started cyclic TPN. Added NaCl to increase pt Na levels  - 5/10 Was not able to tolerate overnight due to severe nausea and increased ostomy output. At 5AM rate was decreased down to 50 ml/hr. Remained this way until 1200. Paged Dr. Marcello Moores who stated to retry  the cyclic TPN again tonight  Today:    Glucose  (goal of <150): WNL   Electrolytes: Na is 130 low, but per MD would like target Na to be at least 130;  Cl low at 92, improving ;K 3.7; phos is 4.3, WNL ; Mag wnl, CorrCa is WNL  Renal: scr  0.83 (crcl > 100), stable  LFTs: AST/ALT elevated at 55/185 but trending down; alk phos at 145 elevated but stable; Tbili wnl  TGs:  95 (4/22), 87 ( 5/7)   Prealbumin: 22.3 (4/22), 15.6 ( 5/7)   NUTRITIONAL GOALS                                                                                             RD recs (4/29): Kcal:  2000-2200 (29-32 kcal/kg) Protein:  80-100 grams/day (1.2-1.4 grams/kg) Fluid:  2.0-2.2 L/day  Clinimix E 5/20 at a goal rate of 75 ml/hr + 20% fat emulsion at 20 ml/hr x 12 hr to provide: 90 g/day protein, 2169 Kcal/day.  - goal:  K > 4.0, Mg > 2.0 with ileus  PLAN       Potassium chloride 10 meq IV x 3  At 1800 today:  Continue 18 hour cyclic TPN Clinimix with electrolytes  5/15 at 50 ml/hr during first hour, followed by 106 ml/hr for 16 hours then 50 ml/hr for final hour ml/hr.   Add NaCl IV to TPN to make TPN equivalent to NS  conc.   Continue 20% fat emulsion at 20 ml/hr for 12 hrs  TPN to contain standard multivitamins and trace elements  MD stopped NS but added 1 L after TPN q24h   Continue CBG times to 2 hours after cyclic TPN start, 1 hour after cyclic TPN stops, one during cyclic TPN infusion and once more while off   BMP, magnesium, phosphorus with AM labs   TPN lab panels on Mondays & Thursdays  F/u daily   Royetta Asal, PharmD, BCPS Pager [phone] 08/25/2017 9:56 AM

## 2017-08-25 NOTE — Progress Notes (Signed)
  Oncology Nurse Navigator Documentation  Navigator Location: [facility] (08/25/17 1506)   )                      Patient Visit Type: Inpatient (08/25/17 1506)                          Acuity Level 3: Ongoing guidance and education provided throughout treatment (08/25/17 1506) Met with patient and mother on 5W to offer support and encouragement. Patient looking forward to eating by mouth.    Time Spent with Patient: 30 (08/25/17 1506)

## 2017-08-26 ENCOUNTER — Inpatient Hospital Stay (HOSPITAL_COMMUNITY): Payer: Medicaid Other

## 2017-08-26 DIAGNOSIS — M7989 Other specified soft tissue disorders: Secondary | ICD-10-CM

## 2017-08-26 LAB — BASIC METABOLIC PANEL
Anion gap: 13 (ref 5–15)
BUN: 20 mg/dL (ref 6–20)
CO2: 26 mmol/L (ref 22–32)
CREATININE: 0.84 mg/dL (ref 0.61–1.24)
Calcium: 9.3 mg/dL (ref 8.9–10.3)
Chloride: 91 mmol/L — ABNORMAL LOW (ref 101–111)
GFR calc Af Amer: >60 mL/min (ref >60)
GFR calc non Af Amer: >60 mL/min (ref >60)
GLUCOSE: 125 mg/dL — AB (ref 65–99)
POTASSIUM: 3.7 mmol/L (ref 3.5–5.1)
SODIUM: 130 mmol/L — AB (ref 135–145)

## 2017-08-26 LAB — GLUCOSE, CAPILLARY
GLUCOSE-CAPILLARY: 92 mg/dL (ref 65–99)
Glucose-Capillary: 111 mg/dL — ABNORMAL HIGH (ref 65–99)
Glucose-Capillary: 104 mg/dL — ABNORMAL HIGH (ref 65–99)
Glucose-Capillary: 143 mg/dL — ABNORMAL HIGH (ref 65–99)

## 2017-08-26 LAB — PHOSPHORUS: Phosphorus: 4.7 mg/dL — ABNORMAL HIGH (ref 2.5–4.6)

## 2017-08-26 LAB — MAGNESIUM: Magnesium: 2.2 mg/dL (ref 1.7–2.4)

## 2017-08-26 MED ORDER — TRACE MINERALS CR-CU-MN-SE-ZN 10-1000-500-60 MCG/ML IV SOLN
INTRAVENOUS | Status: AC
  Administered 2017-08-26: 18:00:00 via INTRAVENOUS
  Filled 2017-08-26: qty 2000

## 2017-08-26 MED ORDER — FAT EMULSION PLANT BASED 20 % IV EMUL
240.0000 mL | INTRAVENOUS | Status: AC
  Administered 2017-08-26: 240 mL via INTRAVENOUS
  Filled 2017-08-26: qty 250

## 2017-08-26 MED ORDER — POTASSIUM CHLORIDE 10 MEQ/100ML IV SOLN
10.0000 meq | INTRAVENOUS | Status: AC
  Administered 2017-08-26 (×6): 10 meq via INTRAVENOUS
  Filled 2017-08-26 (×6): qty 100

## 2017-08-26 NOTE — Progress Notes (Signed)
Patient ID: Jacob Amass., male   DOB: [birth year], 42 y.o.   MRN: [id number] [facility] Surgery Progress Note:   37 Days Post-Op  Subjective: Mental status is alert and frustrated;  Getting hungry again. Objective: Vital signs in last 24 hours: Temp:  [97.6 F (36.4 C)-98.4 F (36.9 C)] 98.4 F (36.9 C) (05/11 0623) Pulse Rate:  [88-93] 89 (05/11 0623) Resp:  [14-16] 16 (05/11 0623) BP: (90-100)/(74-76) 90/74 (05/11 0623) SpO2:  [99 %-100 %] 100 % (05/11 0623)  Intake/Output from previous day: 05/10 0701 - 05/11 0700 In: 993.3 [I.V.:9.3; IV Piggyback:984] Out: 3254 [Urine:475; Stool:2751] Intake/Output this shift: No intake/output data recorded.  Physical Exam: Work of breathing is not labored;    Lab Results:  Results for orders placed or performed during the hospital encounter of 07/20/17 (from the past 48 hour(s))  Glucose, capillary     Status: Abnormal   Collection Time: 08/24/17  4:36 PM  Result Value Ref Range   Glucose-Capillary 120 (H) 65 - 99 mg/dL   Comment 1 Notify RN   Glucose, capillary     Status: Abnormal   Collection Time: 08/24/17  8:08 PM  Result Value Ref Range   Glucose-Capillary 122 (H) 65 - 99 mg/dL  Glucose, capillary     Status: Abnormal   Collection Time: 08/25/17  3:41 AM  Result Value Ref Range   Glucose-Capillary 143 (H) 65 - 99 mg/dL  Basic metabolic panel     Status: Abnormal   Collection Time: 08/25/17  4:22 AM  Result Value Ref Range   Sodium 130 (L) 135 - 145 mmol/L   Potassium 3.7 3.5 - 5.1 mmol/L   Chloride 92 (L) 101 - 111 mmol/L   CO2 27 22 - 32 mmol/L   Glucose, Bld 138 (H) 65 - 99 mg/dL   BUN 21 (H) 6 - 20 mg/dL   Creatinine, Ser 0.83 0.61 - 1.24 mg/dL   Calcium 8.7 (L) 8.9 - 10.3 mg/dL   GFR calc non Af Amer >60 >60 mL/min   GFR calc Af Amer >60 >60 mL/min    Comment: (NOTE) The eGFR has been calculated using the CKD EPI equation. This calculation has not been validated in all clinical situations. eGFR's persistently  <60 mL/min signify possible Chronic Kidney Disease.    Anion gap 11 5 - 15    Comment: Performed at [facility], [place] [address] [place], [place] [address]  Magnesium     Status: None   Collection Time: 08/25/17  4:22 AM  Result Value Ref Range   Magnesium 2.1 1.7 - 2.4 mg/dL    Comment: Performed at [facility], [place] [address] [place], [place] [address]  Phosphorus     Status: None   Collection Time: 08/25/17  4:22 AM  Result Value Ref Range   Phosphorus 4.3 2.5 - 4.6 mg/dL    Comment: Performed at [facility], [place] [address], [place], [place] [address]  Glucose, capillary     Status: Abnormal   Collection Time: 08/25/17  1:42 PM  Result Value Ref Range   Glucose-Capillary 134 (H) 65 - 99 mg/dL  Glucose, capillary     Status: Abnormal   Collection Time: 08/25/17  4:15 PM  Result Value Ref Range   Glucose-Capillary 139 (H) 65 - 99 mg/dL  Glucose, capillary     Status: Abnormal   Collection Time: 08/25/17  8:00 PM  Result Value Ref Range   Glucose-Capillary 136 (  H) 65 - 99 mg/dL  Glucose, capillary     Status: Abnormal   Collection Time: 08/26/17  2:50 AM  Result Value Ref Range   Glucose-Capillary 111 (H) 65 - 99 mg/dL  Magnesium     Status: None   Collection Time: 08/26/17  4:59 AM  Result Value Ref Range   Magnesium 2.2 1.7 - 2.4 mg/dL    Comment: Performed at [facility], [place] [address], [place], [place] [address]  Phosphorus     Status: Abnormal   Collection Time: 08/26/17  4:59 AM  Result Value Ref Range   Phosphorus 4.7 (H) 2.5 - 4.6 mg/dL    Comment: Performed at [facility], [place] [address] [place], [place] [address]  Basic metabolic panel     Status: Abnormal   Collection Time: 08/26/17  4:59 AM  Result Value Ref Range   Sodium 130 (L) 135 - 145 mmol/L   Potassium 3.7 3.5 - 5.1 mmol/L   Chloride 91 (L) 101 - 111 mmol/L   CO2 26 22 - 32 mmol/L   Glucose, Bld 125  (H) 65 - 99 mg/dL   BUN 20 6 - 20 mg/dL   Creatinine, Ser 0.84 0.61 - 1.24 mg/dL   Calcium 9.3 8.9 - 10.3 mg/dL   GFR calc non Af Amer >60 >60 mL/min   GFR calc Af Amer >60 >60 mL/min    Comment: (NOTE) The eGFR has been calculated using the CKD EPI equation. This calculation has not been validated in all clinical situations. eGFR's persistently <60 mL/min signify possible Chronic Kidney Disease.    Anion gap 13 5 - 15    Comment: Performed at [facility], [place] [address], [place], [place] [address]    Radiology/Results: No results found.  Anti-infectives: Anti-infectives (From admission, onward)   Start     Dose/Rate Route Frequency Ordered Stop   07/31/17 1000  piperacillin-tazobactam (ZOSYN) IVPB 3.375 g  Status:  Discontinued     3.375 g 12.5 mL/hr over 240 Minutes Intravenous Every 8 hours 07/31/17 0834 08/03/17 0738   07/27/17 1800  ertapenem (INVANZ) 1 g in sodium chloride 0.9 % 100 mL IVPB  Status:  Discontinued     1 g 200 mL/hr over 30 Minutes Intravenous Every 24 hours 07/27/17 1632 07/31/17 0834   07/20/17 2000  cefoTEtan (CEFOTAN) 2 g in sodium chloride 0.9 % 100 mL IVPB  Status:  Discontinued     2 g 200 mL/hr over 30 Minutes Intravenous Every 12 hours 07/20/17 1346 07/20/17 1415   07/20/17 2000  cefoTEtan in Dextrose 5% (CEFOTAN) IVPB 2 g     2 g Intravenous Every 12 hours 07/20/17 1415 07/20/17 1930   07/20/17 0540  cefoTEtan in Dextrose 5% (CEFOTAN) IVPB 2 g     2 g Intravenous On call to O.R. 07/20/17 0540 07/20/17 0817      Assessment/Plan: Problem List: Patient Active Problem List   Diagnosis Date Noted  . Ileostomy in place [facility]) 07/30/2017  . Ileus, postoperative ([place]) 07/30/2017  . Hypokalemia 07/30/2017  . Protein-calorie malnutrition, severe 07/29/2017  . Genetic testing 04/17/2017  . Port-A-Cath in place 04/17/2017  . Family history of colon cancer 03/29/2017  . Family history of melanoma 03/29/2017  . Family history of  pancreatic cancer 03/29/2017  . Family history of testicular cancer 03/29/2017  . Rectal adenocarcinoma [id number] s/p LAR/ileostomy 07/20/2017 12/07/2016    Will offer a cup of ice cream today.  Otherwise continue  TNA 37 Days Post-Op    LOS: 37 days   Matt B. Hassell Done, MD, [facility] [phone] beeper [phone]  08/26/2017 10:59 AM

## 2017-08-26 NOTE — Progress Notes (Signed)
*  Preliminary Results* Bilateral lower extremity venous duplex completed. Bilateral lower extremities are negative for deep vein thrombosis. There is no evidence of Baker's cyst bilaterally.  08/26/2017 9:42 AM Maudry Mayhew, BS, RVT, RDCS, RDMS

## 2017-08-26 NOTE — Progress Notes (Signed)
PHARMACY - ADULT TOTAL PARENTERAL NUTRITION CONSULT NOTE   Pharmacy Consult for TPN Indication: prolonged ileus; resumed on 5/6  Patient Measurements: Height: 6' 2"  (188 cm) Weight: 141 lb 1.5 oz (64 kg) IBW/kg (Calculated) : 82.2 TPN AdjBW (KG): 73 Body mass index is 18.12 kg/m.   Insulin Requirements: 4 units in past 24 hours  Current Nutrition:  - NPO from 5/6 - ensure 237 mL BID stated on 5/4 --> none taken since 5/5 - TPN resumed on 5/6  IVF: 1071m NS bolus q 24h over 1 hour after cyclic TPN is finished infusing.  Central access: double lumen PAC TPN start date: 07/28/17  Recent Labs    08/24/17 0507 08/25/17 0422 08/26/17 0459  NA 125* 130* 130*  K 3.7 3.7 3.7  CL 87* 92* 91*  CO2 28 27 26   GLUCOSE 127* 138* 125*  BUN 18 21* 20  CREATININE 0.91 0.83 0.84  CALCIUM 8.6* 8.7* 9.3  PHOS 3.9 4.3 4.7*  MG 2.0 2.1 2.2  ALBUMIN 3.1*  --   --   ALKPHOS 145*  --   --   AST 55*  --   --   ALT 185*  --   --   BILITOT 0.5  --   --     ASSESSMENT                                                                                                          HPI: 89yoM with PMH colorectal cancer s/p chemo/rads and now s/p lower anterior resection with diverting loop ileostomy performed on 4/4. On 4/11 the patient developed post-op ileus and was made NPO. Also treating for potential anastomotic leak seen on CT as small presacral fluid collection. The patient's nutritional status has been poor for quite some time despite being on a diet until recently. TPN started on 4/12 - 4/27, resumed on 5/6.  Significant events:  - 4/15: aspiration of pelvic collection - 4/16: IV port clotted. TPN infusion d/ced at 1730 and resumed back at ~2200 - 4/20: DC NG tube, beginning clear liquids - 4/21: tolerated small amounts of clear liquids, one episode of vomiting overnight per discussion with RN - 4/22: trial of full liquids - 4/23: CT a/p shows stable fluid collection, likely  non-contributory, and unchanged pSBO d/t adhesions - 4/24: continues to have bloating/emesis, although 2.4 L stoma output yesterday; feels SBP is resolving - 4/25: advancing to regular diet today, patient thinks he can eat better with food from outside hospital - 4/26:was able to eat solid food but still feels bloated and occasional crampy abd pain - 4/28: TPN d/ced - 5/6: bloating with bilious vomiting; NPO;  resume TPN - 5/7: per discussion with Dr. TMarcello Moores plan is for pt to be discharged on TPN home until surgery can be performed again in about 6 weeks. Would like to stabilize electrolytes prior to discharge. Plan to discharge  In 3-4 days.  - 5/9 Started cyclic TPN. Added NaCl to increase pt Na levels  - 5/10 Was not able to tolerate  overnight due to severe nausea and increased ostomy output. At 5AM rate was decreased down to 50 ml/hr. Remained this way until 1200. Paged Dr. Marcello Moores who stated to retry the cyclic TPN again tonight - 5/11 Patient and RN do not report any problems overnight.  It appears that the cyclic TPN rate was NOT increased to the goal rate, but continued to infuse at 50 ml/hr all night.  Patient is requesting to eat, CCS allowed him one ice cream cup.  Today:    Glucose  (goal of <150): WNL   Electrolytes: Na remains low/stable at 130 (per MD, target Na  >/= 130), Cl low at 91, Phos 4.7 is slightly elevated, K 3.7 (goal > 4) and Mag 2.2 (goal > 2).  Renal: SCr stable (crcl > 100)  LFTs: AST/ALT elevated at 55/185 but trending down; alk phos at 145 elevated but stable; Tbili wnl (5/9)  TGs:  95 (4/22), 87 ( 5/7)   Prealbumin: 22.3 (4/22), 15.6 ( 5/7)   I/O:  Stoma output improving, MD added 1L NS bolus after TPN.  NUTRITIONAL GOALS                                                                                             RD recs (updated 5/6): Kcal:  2000-2200; Protein:  90-100 grams/day  Clinimix E 5/20 at a goal rate of 2000 mL/24 hrs + 20% fat emulsion at 20  ml/hr x 12 hr to provide: 100 g/day protein, 2233 Kcal/day. - goal:  K > 4.0, Mg > 2.0 with ileus  PLAN      Now: Potassium chloride 10 meq IV x 6  At 1800 today:  Clinimix E 5/20, 2000 mL cycled over 18 hours:  50 ml/hr during first hour, followed by 118 ml/hr for 16 hours then 50 ml/hr for final hour ml/hr.   Add NaCl IV to TPN to make TPN equivalent to NS conc (154 mEq/L)  Continue 20% fat emulsion at 20 ml/hr for 12 hrs  TPN to contain standard multivitamins and trace elements  IVF per MD; NS 1 L bolus q24h after TPN  Continue CBG times to 2 hours after cyclic TPN start, 1 hour after cyclic TPN stops, one during cyclic TPN infusion and once more while off   BMP, magnesium, phosphorus with AM labs   TPN lab panels on Mondays & Thursdays  F/u daily   Gretta Arab PharmD, BCPS Pager [phone] 08/26/2017 10:42 AM

## 2017-08-27 LAB — BASIC METABOLIC PANEL
ANION GAP: 11 (ref 5–15)
BUN: 19 mg/dL (ref 6–20)
CHLORIDE: 94 mmol/L — AB (ref 101–111)
CO2: 25 mmol/L (ref 22–32)
Calcium: 8.6 mg/dL — ABNORMAL LOW (ref 8.9–10.3)
Creatinine, Ser: 0.85 mg/dL (ref 0.61–1.24)
Glucose, Bld: 146 mg/dL — ABNORMAL HIGH (ref 65–99)
Potassium: 4.4 mmol/L (ref 3.5–5.1)
SODIUM: 130 mmol/L — AB (ref 135–145)

## 2017-08-27 LAB — GLUCOSE, CAPILLARY
GLUCOSE-CAPILLARY: 100 mg/dL — AB (ref 65–99)
GLUCOSE-CAPILLARY: 105 mg/dL — AB (ref 65–99)
Glucose-Capillary: 111 mg/dL — ABNORMAL HIGH (ref 65–99)
Glucose-Capillary: 94 mg/dL (ref 65–99)

## 2017-08-27 LAB — MAGNESIUM: MAGNESIUM: 2 mg/dL (ref 1.7–2.4)

## 2017-08-27 LAB — PHOSPHORUS: PHOSPHORUS: 4.2 mg/dL (ref 2.5–4.6)

## 2017-08-27 MED ORDER — FAT EMULSION PLANT BASED 20 % IV EMUL
240.0000 mL | INTRAVENOUS | Status: AC
  Administered 2017-08-27: 240 mL via INTRAVENOUS
  Filled 2017-08-27: qty 250

## 2017-08-27 MED ORDER — TRACE MINERALS CR-CU-MN-SE-ZN 10-1000-500-60 MCG/ML IV SOLN
INTRAVENOUS | Status: AC
  Administered 2017-08-27: 18:00:00 via INTRAVENOUS
  Filled 2017-08-27: qty 2000

## 2017-08-27 NOTE — Progress Notes (Signed)
PHARMACY - ADULT TOTAL PARENTERAL NUTRITION CONSULT NOTE   Pharmacy Consult for TPN Indication: prolonged ileus; resumed on 5/6  Patient Measurements: Height: 6' 2"  (188 cm) Weight: 141 lb 1.5 oz (64 kg) IBW/kg (Calculated) : 82.2 TPN AdjBW (KG): 73 Body mass index is 18.12 kg/m.   Insulin Requirements: 1 unit in past 24 hours  Current Nutrition:  - NPO from 5/6 (may have sips of clears) - ensure 237 mL BID stated on 5/4 --> none taken since 5/5 - TPN resumed on 5/6  IVF: 1072m NS bolus q 24h over 1 hour after cyclic TPN is finished infusing.  Central access: double lumen PAC TPN start date: 07/28/17  Recent Labs    08/26/17 0459 08/27/17 0440  NA 130* 130*  K 3.7 4.4  CL 91* 94*  CO2 26 25  GLUCOSE 125* 146*  BUN 20 19  CREATININE 0.84 0.85  CALCIUM 9.3 8.6*  PHOS 4.7* 4.2  MG 2.2 2.0    ASSESSMENT                                                                                                          HPI: 89yoM with PMH colorectal cancer s/p chemo/rads and now s/p lower anterior resection with diverting loop ileostomy performed on 4/4. On 4/11 the patient developed post-op ileus and was made NPO. Also treating for potential anastomotic leak seen on CT as small presacral fluid collection. The patient's nutritional status has been poor for quite some time despite being on a diet until recently. TPN started on 4/12 - 4/27, resumed on 5/6.  Significant events:  - 4/15: aspiration of pelvic collection - 4/16: IV port clotted. TPN infusion d/ced at 1730 and resumed back at ~2200 - 4/20: DC NG tube, beginning clear liquids - 4/21: tolerated small amounts of clear liquids, one episode of vomiting overnight per discussion with RN - 4/22: trial of full liquids - 4/23: CT a/p shows stable fluid collection, likely non-contributory, and unchanged pSBO d/t adhesions - 4/24: continues to have bloating/emesis, although 2.4 L stoma output yesterday; feels SBP is resolving -  4/25: advancing to regular diet today, patient thinks he can eat better with food from outside hospital - 4/26:was able to eat solid food but still feels bloated and occasional crampy abd pain - 4/28: TPN d/ced - 5/6: bloating with bilious vomiting; NPO;  resume TPN - 5/7: per discussion with Dr. TMarcello Moores plan is for pt to be discharged on TPN home until surgery can be performed again in about 6 weeks. Would like to stabilize electrolytes prior to discharge. Plan to discharge  In 3-4 days.  - 5/9 Started cyclic TPN. Added NaCl to increase pt Na levels  - 5/10 Was not able to tolerate overnight due to severe nausea and increased ostomy output. At 5AM rate was decreased down to 50 ml/hr. Remained this way until 1200. Paged Dr. TMarcello Mooreswho stated to retry the cyclic TPN again tonight - 5/11 Patient and RN do not report any problems overnight.  It appears that the cyclic  TPN rate was NOT increased to the goal rate, but continued to infuse at 50 ml/hr all night.  Patient is requesting to eat, CCS allowed him one ice cream cup. - 5/12 Pt is tolerating TPN goal rate of 118 ml/hr without complaints.  Today:    Glucose  (goal of <150): WNL   Electrolytes: Na remains low/stable at 130 (per MD, target Na  >/= 130), Cl low/improved to 94.  K, Mag, Phos at goal.    Renal: SCr stable (crcl > 100)  LFTs: AST/ALT elevated at 55/185 but trending down; alk phos at 145 elevated but stable; Tbili wnl (5/9)  TGs:  95 (4/22), 87 ( 5/7)   Prealbumin: 22.3 (4/22), 15.6 ( 5/7)   I/O:  Colostomy output improving (down to 1925 mL/24, decreased from 4L and 2L over past few days).  1L NS bolus after TPN per MD  NUTRITIONAL GOALS                                                                                             RD recs (updated 5/6): Kcal:  2000-2200; Protein:  90-100 grams/day  Clinimix E 5/20 at a goal rate of 2000 mL/24 hrs + 20% fat emulsion at 20 ml/hr x 12 hr to provide: 100 g/day protein, 2233  Kcal/day. - goal:  K > 4.0, Mg > 2.0 with ileus  PLAN       At 1800 today:  Clinimix E 5/20, 2000 mL cycled over 18 hours:  50 ml/hr during first hour, followed by 118 ml/hr for 16 hours then 50 ml/hr for final hour ml/hr.   Add NaCl to TPN to make TPN equivalent to NS conc (154 mEq/L)  Continue 20% fat emulsion at 20 ml/hr for 12 hrs  TPN to contain standard multivitamins and trace elements  IVF per MD; NS 1 L bolus q24h after TPN  Continue CBG times to 2 hours after cyclic TPN start, 1 hour after cyclic TPN stops, one during cyclic TPN infusion and once more while off   TPN lab panels on Mondays & Thursdays  F/u daily   Gretta Arab PharmD, BCPS Pager [phone] 08/27/2017 7:51 AM

## 2017-08-27 NOTE — Progress Notes (Signed)
Patient ID: Jacob Amass., male   DOB: [birth year], 42 y.o.   MRN: [id number] [facility] Progress Note:   38 Days Post-Op  Subjective: Mental status is reported as unchanged.  Patient sleeping at present Objective: Vital signs in last 24 hours: Temp:  [98.1 F (36.7 C)-98.9 F (37.2 C)] 98.1 F (36.7 C) (05/12 0610) Pulse Rate:  [79-96] 80 (05/12 0610) Resp:  [14-16] 16 (05/12 0610) BP: (86-92)/(63-67) 92/67 (05/12 0610) SpO2:  [100 %] 100 % (05/12 0610)  Intake/Output from previous day: 05/11 0701 - 05/12 0700 In: 200 [IV Piggyback:200] Out: 2300 [Urine:375; Stool:1925] Intake/Output this shift: Total I/O In: -  Out: 300 [Urine:300]  Physical Exam: Work of breathing is not labored.  He tolerated the ice cream without N & V but has reported lactose intolerance.    Lab Results:  Results for orders placed or performed during the hospital encounter of 07/20/17 (from the past 48 hour(s))  Glucose, capillary     Status: Abnormal   Collection Time: 08/25/17  1:42 PM  Result Value Ref Range   Glucose-Capillary 134 (H) 65 - 99 mg/dL  Glucose, capillary     Status: Abnormal   Collection Time: 08/25/17  4:15 PM  Result Value Ref Range   Glucose-Capillary 139 (H) 65 - 99 mg/dL  Glucose, capillary     Status: Abnormal   Collection Time: 08/25/17  8:00 PM  Result Value Ref Range   Glucose-Capillary 136 (H) 65 - 99 mg/dL  Glucose, capillary     Status: Abnormal   Collection Time: 08/26/17  2:50 AM  Result Value Ref Range   Glucose-Capillary 111 (H) 65 - 99 mg/dL  Magnesium     Status: None   Collection Time: 08/26/17  4:59 AM  Result Value Ref Range   Magnesium 2.2 1.7 - 2.4 mg/dL    Comment: Performed at [facility], [place] [address], [place], [place] [address]  Phosphorus     Status: Abnormal   Collection Time: 08/26/17  4:59 AM  Result Value Ref Range   Phosphorus 4.7 (H) 2.5 - 4.6 mg/dL    Comment: Performed at [facility],  [place] [address], [place], [place] [address]  Basic metabolic panel     Status: Abnormal   Collection Time: 08/26/17  4:59 AM  Result Value Ref Range   Sodium 130 (L) 135 - 145 mmol/L   Potassium 3.7 3.5 - 5.1 mmol/L   Chloride 91 (L) 101 - 111 mmol/L   CO2 26 22 - 32 mmol/L   Glucose, Bld 125 (H) 65 - 99 mg/dL   BUN 20 6 - 20 mg/dL   Creatinine, Ser 0.84 0.61 - 1.24 mg/dL   Calcium 9.3 8.9 - 10.3 mg/dL   GFR calc non Af Amer >60 >60 mL/min   GFR calc Af Amer >60 >60 mL/min    Comment: (NOTE) The eGFR has been calculated using the CKD EPI equation. This calculation has not been validated in all clinical situations. eGFR's persistently <60 mL/min signify possible Chronic Kidney Disease.    Anion gap 13 5 - 15    Comment: Performed at [facility], [place] [address], [place], [place] [address]  Glucose, capillary     Status: Abnormal   Collection Time: 08/26/17 12:53 PM  Result Value Ref Range   Glucose-Capillary 104 (H) 65 - 99 mg/dL  Glucose, capillary     Status: None   Collection Time: 08/26/17  4:37 PM  Result Value Ref Range   Glucose-Capillary 92 65 - 99 mg/dL  Glucose, capillary     Status: Abnormal   Collection Time: 08/26/17  8:09 PM  Result Value Ref Range   Glucose-Capillary 143 (H) 65 - 99 mg/dL  Glucose, capillary     Status: Abnormal   Collection Time: 08/27/17  3:11 AM  Result Value Ref Range   Glucose-Capillary 111 (H) 65 - 99 mg/dL  Basic metabolic panel     Status: Abnormal   Collection Time: 08/27/17  4:40 AM  Result Value Ref Range   Sodium 130 (L) 135 - 145 mmol/L   Potassium 4.4 3.5 - 5.1 mmol/L   Chloride 94 (L) 101 - 111 mmol/L   CO2 25 22 - 32 mmol/L   Glucose, Bld 146 (H) 65 - 99 mg/dL   BUN 19 6 - 20 mg/dL   Creatinine, Ser 0.85 0.61 - 1.24 mg/dL   Calcium 8.6 (L) 8.9 - 10.3 mg/dL   GFR calc non Af Amer >60 >60 mL/min   GFR calc Af Amer >60 >60 mL/min    Comment: (NOTE) The eGFR has been calculated using the CKD EPI  equation. This calculation has not been validated in all clinical situations. eGFR's persistently <60 mL/min signify possible Chronic Kidney Disease.    Anion gap 11 5 - 15    Comment: Performed at [facility], [place] [address], [place], [place] [address]  Magnesium     Status: None   Collection Time: 08/27/17  4:40 AM  Result Value Ref Range   Magnesium 2.0 1.7 - 2.4 mg/dL    Comment: Performed at [facility], [place] [address] [place], [place] [address]  Phosphorus     Status: None   Collection Time: 08/27/17  4:40 AM  Result Value Ref Range   Phosphorus 4.2 2.5 - 4.6 mg/dL    Comment: Performed at [facility], [place] [address], [place], [place] [address]    Radiology/Results: No results found.  Anti-infectives: Anti-infectives (From admission, onward)   Start     Dose/Rate Route Frequency Ordered Stop   07/31/17 1000  piperacillin-tazobactam (ZOSYN) IVPB 3.375 g  Status:  Discontinued     3.375 g 12.5 mL/hr over 240 Minutes Intravenous Every 8 hours 07/31/17 0834 08/03/17 0738   07/27/17 1800  ertapenem (INVANZ) 1 g in sodium chloride 0.9 % 100 mL IVPB  Status:  Discontinued     1 g 200 mL/hr over 30 Minutes Intravenous Every 24 hours 07/27/17 1632 07/31/17 0834   07/20/17 2000  cefoTEtan (CEFOTAN) 2 g in sodium chloride 0.9 % 100 mL IVPB  Status:  Discontinued     2 g 200 mL/hr over 30 Minutes Intravenous Every 12 hours 07/20/17 1346 07/20/17 1415   07/20/17 2000  cefoTEtan in Dextrose 5% (CEFOTAN) IVPB 2 g     2 g Intravenous Every 12 hours 07/20/17 1415 07/20/17 1930   07/20/17 0540  cefoTEtan in Dextrose 5% (CEFOTAN) IVPB 2 g     2 g Intravenous On call to O.R. 07/20/17 0540 07/20/17 0817      Assessment/Plan: Problem List: Patient Active Problem List   Diagnosis Date Noted  . Ileostomy in place [facility]) 07/30/2017  . Ileus, postoperative ([place]) 07/30/2017  . Hypokalemia 07/30/2017  . Protein-calorie  malnutrition, severe 07/29/2017  . Genetic testing 04/17/2017  . Port-A-Cath in place 04/17/2017  . Family history of colon cancer 03/29/2017  . Family history of melanoma 03/29/2017  .  Family history of pancreatic cancer 03/29/2017  . Family history of testicular cancer 03/29/2017  . Rectal adenocarcinoma [id number] s/p LAR/ileostomy 07/20/2017 12/07/2016    On TNA;  No acute changes 38 Days Post-Op    LOS: 38 days   Matt B. Hassell Done, MD, [facility] [phone] beeper [phone]  08/27/2017 8:21 AM

## 2017-08-28 LAB — CBC
HCT: 34.3 % — ABNORMAL LOW (ref 39.0–52.0)
Hemoglobin: 11.8 g/dL — ABNORMAL LOW (ref 13.0–17.0)
MCH: 29.9 pg (ref 26.0–34.0)
MCHC: 34.4 g/dL (ref 30.0–36.0)
MCV: 86.8 fL (ref 78.0–100.0)
Platelets: 333 10*3/uL (ref 150–400)
RBC: 3.95 MIL/uL — ABNORMAL LOW (ref 4.22–5.81)
RDW: 13.3 % (ref 11.5–15.5)
WBC: 7.9 10*3/uL (ref 4.0–10.5)

## 2017-08-28 LAB — DIFFERENTIAL
BASOS ABS: 0 10*3/uL (ref 0.0–0.1)
BASOS PCT: 0 %
EOS ABS: 0.2 10*3/uL (ref 0.0–0.7)
Eosinophils Relative: 3 %
Lymphocytes Relative: 14 %
Lymphs Abs: 1.1 10*3/uL (ref 0.7–4.0)
Monocytes Absolute: 0.7 10*3/uL (ref 0.1–1.0)
Monocytes Relative: 9 %
NEUTROS ABS: 5.8 10*3/uL (ref 1.7–7.7)
NEUTROS PCT: 74 %

## 2017-08-28 LAB — COMPREHENSIVE METABOLIC PANEL
ALK PHOS: 165 U/L — AB (ref 38–126)
ALT: 165 U/L — ABNORMAL HIGH (ref 17–63)
ANION GAP: 8 (ref 5–15)
AST: 46 U/L — ABNORMAL HIGH (ref 15–41)
Albumin: 3.2 g/dL — ABNORMAL LOW (ref 3.5–5.0)
BUN: 17 mg/dL (ref 6–20)
CALCIUM: 8.5 mg/dL — AB (ref 8.9–10.3)
CHLORIDE: 99 mmol/L — AB (ref 101–111)
CO2: 25 mmol/L (ref 22–32)
CREATININE: 0.79 mg/dL (ref 0.61–1.24)
Glucose, Bld: 116 mg/dL — ABNORMAL HIGH (ref 65–99)
Potassium: 4 mmol/L (ref 3.5–5.1)
Sodium: 132 mmol/L — ABNORMAL LOW (ref 135–145)
Total Bilirubin: 0.3 mg/dL (ref 0.3–1.2)
Total Protein: 6.8 g/dL (ref 6.5–8.1)

## 2017-08-28 LAB — PHOSPHORUS: PHOSPHORUS: 4.5 mg/dL (ref 2.5–4.6)

## 2017-08-28 LAB — PREALBUMIN: PREALBUMIN: 22.6 mg/dL (ref 18–38)

## 2017-08-28 LAB — MAGNESIUM: MAGNESIUM: 1.9 mg/dL (ref 1.7–2.4)

## 2017-08-28 LAB — GLUCOSE, CAPILLARY
GLUCOSE-CAPILLARY: 112 mg/dL — AB (ref 65–99)
Glucose-Capillary: 132 mg/dL — ABNORMAL HIGH (ref 65–99)
Glucose-Capillary: 136 mg/dL — ABNORMAL HIGH (ref 65–99)

## 2017-08-28 LAB — TRIGLYCERIDES: TRIGLYCERIDES: 108 mg/dL (ref <150)

## 2017-08-28 MED ORDER — TRACE MINERALS CR-CU-MN-SE-ZN 10-1000-500-60 MCG/ML IV SOLN
INTRAVENOUS | Status: AC
  Administered 2017-08-28: 18:00:00 via INTRAVENOUS
  Filled 2017-08-28: qty 2000

## 2017-08-28 MED ORDER — FAT EMULSION PLANT BASED 20 % IV EMUL
240.0000 mL | INTRAVENOUS | Status: AC
  Administered 2017-08-28: 240 mL via INTRAVENOUS
  Filled 2017-08-28: qty 250

## 2017-08-28 MED ORDER — MAGNESIUM SULFATE IN D5W 1-5 GM/100ML-% IV SOLN
1.0000 g | Freq: Once | INTRAVENOUS | Status: AC
  Administered 2017-08-28: 1 g via INTRAVENOUS
  Filled 2017-08-28: qty 100

## 2017-08-28 MED ORDER — INSULIN ASPART 100 UNIT/ML ~~LOC~~ SOLN
0.0000 [IU] | SUBCUTANEOUS | Status: DC
  Administered 2017-08-29: 1 [IU] via SUBCUTANEOUS

## 2017-08-28 MED ORDER — SODIUM CHLORIDE 0.9 % IV BOLUS
1000.0000 mL | Freq: Two times a day (BID) | INTRAVENOUS | Status: DC
  Administered 2017-08-28 – 2017-08-29 (×3): 1000 mL via INTRAVENOUS

## 2017-08-28 NOTE — Progress Notes (Signed)
Nutrition Follow-up  DOCUMENTATION CODES:   Severe malnutrition in context of chronic illness  INTERVENTION:  - Will d/c Ensure Enlive. - Continue cyclic TPN per Pharmacy. - Diet advancement as medically feasible.  NUTRITION DIAGNOSIS:   Severe Malnutrition related to chronic illness, cancer and cancer related treatments as evidenced by moderate fat depletion, severe fat depletion, moderate muscle depletion, severe muscle depletion. -ongoing  GOAL:   Patient will meet greater than or equal to 90% of their needs -met with cyclic TPN  MONITOR:   Labs, I & O's, Skin, Weight trends, Diet advancement  ASSESSMENT:   42 year old male with PMH significant for stage II colorectal cancer s/p chemo and radiation.  Significant Events: 4/4-s/p robotic assisted lower anterior resection with splenic flexure mobilization and diverting loop ileostomy 4/11-CT shows ileus with small presacral fluid collection 4/12 - NPO,TPN 4/20- clear liquids 4/22- full liquids 4/29 - TPN d/c, diet advanced to soft diet 5/6 - TPN restarted, diet changed to NPO d/t bloating          and bilious vomiting 5/7 - plan determined to have pt d/c home on TPN          until return to OR in ~6 weeks 5/9 - TPN changed to cyclic regimen   Weight -7 lbs/3.3 kg from 5/3-5/10; no new weight since that time as weight is being obtained on a weekly basis. Pt remains NPO since 5/6. He has a R chest port and is receiving cyclic TPN: 2L Clinimix E 5/20 over 18 hours (50 mL/hr x1 hour, 118 mL/hr x16 hours, 50 mL/hr x1 hour) with 20% ILE @ 20 mL/hr x12 hours. This regimen is providing 100 grams of protein and 2240 kcal. Meeting 100% of needs with cyclic TPN.   Medications reviewed; 40 mg oral Pepcid/day, sliding scale Novolog, 1 g IV Mg sulfate x1 run today, 80 mg Mylicon TID.  Labs reviewed; CBG: 132 mg/dL this AM, Na: 132 mmol/L, Cl: 99 mmol/L, Ca: 8.5 mg/dL, triglycerides: 108 mg/dL (this AM).     Diet Order:   Diet  Order           Diet NPO time specified Except for: Ice Chips, Sips with Meds  Diet effective now          EDUCATION NEEDS:   No education needs have been identified at this time  Skin:  Skin Assessment: Skin Integrity Issues: Skin Integrity Issues:: Incisions Incisions: surgical incision to abdomen  Last BM:  5/12  Height:   Ht Readings from Last 1 Encounters:  07/20/17 6' 2"  (1.88 m)    Weight:   Wt Readings from Last 1 Encounters:  08/25/17 141 lb 1.5 oz (64 kg)    Ideal Body Weight:  86.4 kg  BMI:  Body mass index is 18.12 kg/m.  Estimated Nutritional Needs:   Kcal:  2000-2200 (29-32 kcal/kg)  Protein:  90-100g  Fluid:  2.0-2.2 L/day      Jarome Matin, MS, RD, LDN, [facility] Clinical Dietitian Pager # [phone] After hours/weekend pager # [phone]

## 2017-08-28 NOTE — Progress Notes (Signed)
[facility] is prepared for DC home when pt is deemed clinically ready.  [facility] TPN Pharmacy team can cycle pt to 12 hour on/off regimen at home to avoid extended hospital stay for this if MD in agreement.   If patient discharges after hours, please call [phone].   [name] 08/28/2017, 5:29 PM

## 2017-08-28 NOTE — Progress Notes (Signed)
39 Days Post-Op   Subjective/Chief Complaint:  No complaints over the weekend.  TPN is still at 16h   Objective: Vital signs in last 24 hours: Temp:  [98.6 F (37 C)-98.8 F (37.1 C)] 98.8 F (37.1 C) (05/13 0529) Pulse Rate:  [81-104] 81 (05/13 0529) Resp:  [15-16] 15 (05/13 0529) BP: (83-89)/(52-69) 83/52 (05/13 0529) SpO2:  [98 %-100 %] 98 % (05/13 0529) Last BM Date: 08/27/17  Intake/Output from previous day: 05/12 0701 - 05/13 0700 In: 2393.8 [I.V.:393; IV Piggyback:2000.8] Out: 3375 [Urine:525; Stool:2650] Intake/Output this shift: No intake/output data recorded.  General appearance: alert and cooperative Resp: breathing comfortably Cardio: RRR GI: Abd soft, less distended, non tender. Stoma pink, productive; liquid stool in appliance  Lab Results:  Recent Labs    08/28/17 0516  WBC 7.9  HGB 11.8*  HCT 34.3*  PLT 333   BMET Recent Labs    08/27/17 0440 08/28/17 0516  NA 130* 132*  K 4.4 4.0  CL 94* 99*  CO2 25 25  GLUCOSE 146* 116*  BUN 19 17  CREATININE 0.85 0.79  CALCIUM 8.6* 8.5*   PT/INR No results for input(s): LABPROT, INR in the last 72 hours. ABG No results for input(s): PHART, HCO3 in the last 72 hours.  Invalid input(s): PCO2, PO2  Studies/Results: No results found.  Anti-infectives: Anti-infectives (From admission, onward)   Start     Dose/Rate Route Frequency Ordered Stop   07/31/17 1000  piperacillin-tazobactam (ZOSYN) IVPB 3.375 g  Status:  Discontinued     3.375 g 12.5 mL/hr over 240 Minutes Intravenous Every 8 hours 07/31/17 0834 08/03/17 0738   07/27/17 1800  ertapenem (INVANZ) 1 g in sodium chloride 0.9 % 100 mL IVPB  Status:  Discontinued     1 g 200 mL/hr over 30 Minutes Intravenous Every 24 hours 07/27/17 1632 07/31/17 0834   07/20/17 2000  cefoTEtan (CEFOTAN) 2 g in sodium chloride 0.9 % 100 mL IVPB  Status:  Discontinued     2 g 200 mL/hr over 30 Minutes Intravenous Every 12 hours 07/20/17 1346 07/20/17 1415   07/20/17 2000  cefoTEtan in Dextrose 5% (CEFOTAN) IVPB 2 g     2 g Intravenous Every 12 hours 07/20/17 1415 07/20/17 1930   07/20/17 0540  cefoTEtan in Dextrose 5% (CEFOTAN) IVPB 2 g     2 g Intravenous On call to O.R. 07/20/17 0540 07/20/17 0817      Assessment/Plan: s/p Procedure(s): XI ROBOTIC ASSISTED LOWER ANTERIOR RESECTION ERAS PATHWAY (N/A) DIVERTING LOOP COLOSTOMY (N/A) NPO CT scan looks unchanged Goal: ambulate 5x/day Cont PO pain meds as tolerated Neurontin for foot neuropathy Hyponatremia: due to high output ileostomy, cont 0.9 NS 1L bolus after completion of TPN for fluid and Na replacement. Na coming up appropriately.   Hypokalemia:  Normal today and stable Cont pepcid for ulcer prophylaxis PPx: Lov, SCDs  Home health set up for TPN.  Awaiting pharmacy to cycle to 12h.  Doesn't appear that any progress was made over the weekend.     LOS: 39 days   Willie Loy C. [id number]

## 2017-08-28 NOTE — Progress Notes (Signed)
PHARMACY - ADULT TOTAL PARENTERAL NUTRITION CONSULT NOTE   Pharmacy Consult for TPN Indication: prolonged ileus; resumed on 5/6  Patient Measurements: Height: 6' 2"  (188 cm) Weight: 141 lb 1.5 oz (64 kg) IBW/kg (Calculated) : 82.2 TPN AdjBW (KG): 73 Body mass index is 18.12 kg/m.   Insulin Requirements: 1 unit in past 24 hours  Current Nutrition:  - NPO from 5/6 (may have sips of clears) - ensure 237 mL BID stated on 5/4 --> none taken since 5/5 - TPN resumed on 5/6  IVF: 105m NS bolus q 24h over 1 hour after cyclic TPN is finished infusing.  Central access: double lumen PAC TPN start date: 07/28/17  Recent Labs    08/27/17 0440 08/28/17 0516  NA 130* 132*  K 4.4 4.0  CL 94* 99*  CO2 25 25  GLUCOSE 146* 116*  BUN 19 17  CREATININE 0.85 0.79  CALCIUM 8.6* 8.5*  PHOS 4.2 4.5  MG 2.0 1.9  ALBUMIN  --  3.2*  ALKPHOS  --  165*  AST  --  46*  ALT  --  165*  BILITOT  --  0.3  TRIG  --  108    ASSESSMENT                                                                                                          HPI: [id number] with PMH colorectal cancer s/p chemo/rads and now s/p lower anterior resection with diverting loop ileostomy performed on 4/4. On 4/11 the patient developed post-op ileus and was made NPO. Also treating for potential anastomotic leak seen on CT as small presacral fluid collection. The patient's nutritional status has been poor for quite some time despite being on a diet until recently. TPN started on 4/12 - 4/27, resumed on 5/6.  Significant events:  - 4/15: aspiration of pelvic collection - 4/16: IV port clotted. TPN infusion d/ced at 1730 and resumed back at ~2200 - 4/20: DC NG tube, beginning clear liquids - 4/21: tolerated small amounts of clear liquids, one episode of vomiting overnight per discussion with RN - 4/22: trial of full liquids - 4/23: CT a/p shows stable fluid collection, likely non-contributory, and unchanged pSBO d/t adhesions -  4/24: continues to have bloating/emesis, although 2.4 L stoma output yesterday; feels SBP is resolving - 4/25: advancing to regular diet today, patient thinks he can eat better with food from outside hospital - 4/26:was able to eat solid food but still feels bloated and occasional crampy abd pain - 4/28: TPN d/ced - 5/6: bloating with bilious vomiting; NPO;  resume TPN - 5/7: per discussion with Dr. TMarcello Moores plan is for pt to be discharged on TPN home until surgery can be performed again in about 6 weeks. Would like to stabilize electrolytes prior to discharge. Plan to discharge  In 3-4 days.  - 5/9 Started cyclic TPN. Added NaCl to increase pt Na levels  - 5/10 Was not able to tolerate overnight due to severe nausea and increased ostomy output. At 5AM rate was decreased down to  50 ml/hr. Remained this way until 1200. Paged Dr. Marcello Moores who stated to retry the cyclic TPN again tonight - 5/11 Patient and RN do not report any problems overnight.  It appears that the cyclic TPN rate was NOT increased to the goal rate, but continued to infuse at 50 ml/hr all night.  Patient is requesting to eat, CCS allowed him one ice cream cup. - 5/12 Pt is tolerating TPN goal rate of 118 ml/hr without complaints. - 5/13: start cycle for 12 hrs  Today:    Glucose  (goal of <150): WNL   Electrolytes: Na remains low but up 132 (per MD, target Na  >/= 130), Cl low/improved to 99.  K, Phos, CorrCa at goal; Mag 1.9    Renal: SCr stable (crcl > 100)  LFTs: AST elevated but trending down; alk phos at 145 elevated but stable; Tbili wnl (5/9)  TGs:  95 (4/22), 87 ( 5/7), 108 (5/13)  Prealbumin: 22.3 (4/22), 15.6 ( 5/7)   I/O:  Colostomy output 2650 mL; - 941m;  1L NS bolus after TPN per MD  NUTRITIONAL GOALS                                                                                             RD recs (updated 5/6): Kcal:  2000-2200; Protein:  90-100 grams/day  Clinimix E 5/20 at a goal rate of 2000 mL/24 hrs  + 20% fat emulsion at 20 ml/hr x 12 hr to provide: 100 g/day protein, 2233 Kcal/day. - goal:  K > 4.0, Mg > 2.0 with ileus  PLAN       Now: - Magnesium sulfate 1gm IV x1  At 1800 today:  Clinimix E 5/20, 2000 mL cycled over 12 hours:  50 ml/hr during first hour, followed by 190 ml/hr for 10 hours then 50 ml/hr for final hour ml/hr.   Add NaCl to TPN to make TPN equivalent to NS conc (154 mEq/L)  Continue 20% fat emulsion at 20 ml/hr for 12 hrs  TPN to contain standard multivitamins and trace elements  IVF per MD; NS 1 L bolus bid after TPN (scheduled bolus for 1p and 5p on 5/13; if to continue on 5/14 will schedule for 9a and 5p)  Continue CBG times to 2 hours after cyclic TPN start, 1 hour after cyclic TPN stops, one during cyclic TPN infusion and once more while off   TPN lab panels on Mondays & Thursdays  F/u daily   ADia Sitter PharmD, BCPS 08/28/2017 7:14 AM

## 2017-08-29 ENCOUNTER — Inpatient Hospital Stay (HOSPITAL_COMMUNITY): Payer: Medicaid Other

## 2017-08-29 LAB — BASIC METABOLIC PANEL
Anion gap: 6 (ref 5–15)
BUN: 17 mg/dL (ref 6–20)
CO2: 25 mmol/L (ref 22–32)
CREATININE: 0.72 mg/dL (ref 0.61–1.24)
Calcium: 8 mg/dL — ABNORMAL LOW (ref 8.9–10.3)
Chloride: 105 mmol/L (ref 101–111)
GFR calc Af Amer: >60 mL/min (ref >60)
GLUCOSE: 109 mg/dL — AB (ref 65–99)
Potassium: 4.5 mmol/L (ref 3.5–5.1)
SODIUM: 136 mmol/L (ref 135–145)

## 2017-08-29 LAB — GLUCOSE, CAPILLARY
GLUCOSE-CAPILLARY: 105 mg/dL — AB (ref 65–99)
GLUCOSE-CAPILLARY: 88 mg/dL (ref 65–99)

## 2017-08-29 LAB — MAGNESIUM: MAGNESIUM: 1.8 mg/dL (ref 1.7–2.4)

## 2017-08-29 LAB — PHOSPHORUS: Phosphorus: 3.9 mg/dL (ref 2.5–4.6)

## 2017-08-29 MED ORDER — HEPARIN SOD (PORK) LOCK FLUSH 100 UNIT/ML IV SOLN
500.0000 [IU] | INTRAVENOUS | Status: AC | PRN
  Administered 2017-08-29: 500 [IU]

## 2017-08-29 MED ORDER — ONDANSETRON HCL 4 MG PO TABS: 4.0000 mg | ORAL_TABLET | Freq: Four times a day (QID) | ORAL | 0 refills | Status: DC | PRN

## 2017-08-29 MED ORDER — METHOCARBAMOL 500 MG PO TABS: 1000.0000 mg | ORAL_TABLET | Freq: Four times a day (QID) | ORAL | 0 refills | Status: DC | PRN

## 2017-08-29 MED ORDER — GABAPENTIN 100 MG PO CAPS: 200.0000 mg | ORAL_CAPSULE | Freq: Three times a day (TID) | ORAL | 0 refills | Status: DC

## 2017-08-29 MED ORDER — OXYCODONE HCL 5 MG PO TABS: 5.0000 mg | ORAL_TABLET | ORAL | 0 refills | Status: DC | PRN

## 2017-08-29 MED ORDER — MAGNESIUM SULFATE 2 GM/50ML IV SOLN
2.0000 g | Freq: Once | INTRAVENOUS | Status: AC
  Administered 2017-08-29: 2 g via INTRAVENOUS
  Filled 2017-08-29: qty 50

## 2017-08-29 MED ORDER — SODIUM CHLORIDE 0.9 % IV BOLUS: 1000.0000 mL | Freq: Two times a day (BID) | INTRAVENOUS | 1 refills | Status: DC

## 2017-08-29 MED FILL — GABAPENTIN 100 MG CAPSULE: 100 | 15 days supply | Qty: 90 | Fill #0

## 2017-08-29 MED FILL — METHOCARBAMOL 500 MG TABLET: 500 | 3 days supply | Qty: 30 | Fill #0

## 2017-08-29 MED FILL — ONDANSETRON HCL 4 MG TABLET: 4 | 5 days supply | Qty: 20 | Fill #0

## 2017-08-29 MED FILL — oxyCODONE HCL 5 MG TABS: 5 | 2 days supply | Qty: 30 | Fill #0

## 2017-08-29 NOTE — Progress Notes (Signed)
PHARMACY - ADULT TOTAL PARENTERAL NUTRITION CONSULT NOTE   Pharmacy Consult for TPN Indication: prolonged ileus; resumed on 5/6  Patient Measurements: Height: 6' 2"  (188 cm) Weight: 141 lb 1.5 oz (64 kg) IBW/kg (Calculated) : 82.2 TPN AdjBW (KG): 73 Body mass index is 18.12 kg/m.   Insulin Requirements: 1 unit in past 24 hours  Current Nutrition:  - NPO from 5/6 (may have sips of clears) - TPN resumed on 5/6  IVF: 1040m NS bolus BID after cyclic TPN is finished infusing.  Central access: double lumen PAC TPN start date: 07/28/17  Recent Labs    08/27/17 0440 08/28/17 0516  NA 130* 132*  K 4.4 4.0  CL 94* 99*  CO2 25 25  GLUCOSE 146* 116*  BUN 19 17  CREATININE 0.85 0.79  CALCIUM 8.6* 8.5*  PHOS 4.2 4.5  MG 2.0 1.9  ALBUMIN  --  3.2*  ALKPHOS  --  165*  AST  --  46*  ALT  --  165*  BILITOT  --  0.3  TRIG  --  108  PREALBUMIN  --  22.6    ASSESSMENT                                                                                                          HPI: 89yoM with PMH colorectal cancer s/p chemo/rads and now s/p lower anterior resection with diverting loop ileostomy performed on 4/4. On 4/11 the patient developed post-op ileus and was made NPO. Also treating for potential anastomotic leak seen on CT as small presacral fluid collection. The patient's nutritional status has been poor for quite some time despite being on a diet until recently. TPN started on 4/12 - 4/27, resumed on 5/6.  Significant events:  - 4/15: aspiration of pelvic collection - 4/16: IV port clotted. TPN infusion d/ced at 1730 and resumed back at ~2200 - 4/20: DC NG tube, beginning clear liquids - 4/21: tolerated small amounts of clear liquids, one episode of vomiting overnight per discussion with RN - 4/22: trial of full liquids - 4/23: CT a/p shows stable fluid collection, likely non-contributory, and unchanged pSBO d/t adhesions - 4/24: continues to have bloating/emesis, although 2.4  L stoma output yesterday; feels SBP is resolving - 4/25: advancing to regular diet today, patient thinks he can eat better with food from outside hospital - 4/26:was able to eat solid food but still feels bloated and occasional crampy abd pain - 4/28: TPN d/ced - 5/6: bloating with bilious vomiting; NPO;  resume TPN - 5/7: per discussion with Dr. TMarcello Moores plan is for pt to be discharged on TPN home until surgery can be performed again in about 6 weeks. Would like to stabilize electrolytes prior to discharge. Plan to discharge  In 3-4 days.  - 5/9 Started cyclic TPN. Added NaCl to increase pt Na levels  - 5/10 Was not able to tolerate overnight due to severe nausea and increased ostomy output. At 5AM rate was decreased down to 50 ml/hr. Remained this way until 1200. Paged Dr. TMarcello Mooreswho  stated to retry the cyclic TPN again tonight - 5/11 Patient and RN do not report any problems overnight.  It appears that the cyclic TPN rate was NOT increased to the goal rate, but continued to infuse at 50 ml/hr all night.  Patient is requesting to eat, CCS allowed him one ice cream cup. - 5/12 Pt is tolerating TPN goal rate of 118 ml/hr without complaints. - 5/13: start cycle for 12 hrs  Today:    Glucose  (goal of <150): WNL   Electrolytes: Na imiproved to 136 (per MD, target Na  >/= 130), Cl improved to 105.  K, Phos, CorrCa at goal; Mag 1.8  Renal: SCr stable (crcl > 100)  LFTs: AST elevated but trending down; alk phos at 145 elevated but stable; Tbili wnl (5/9)  TGs:  95 (4/22), 87 ( 5/7), 108 (5/13)  Prealbumin: 22.3 (4/22), 15.6 ( 5/7)   NUTRITIONAL GOALS                                                                                             RD recs (updated 5/6): Kcal:  2000-2200; Protein:  90-100 grams/day  Clinimix E 5/20 at a goal rate of 2000 mL/24 hrs + 20% fat emulsion at 20 ml/hr x 12 hr to provide: 100 g/day protein, 2233 Kcal/day.  Goal:  K > 4.0, Mg > 2.0 with ileus  Most recent  formula: Clinimix E 5/20, 2000 mL cycled over 12 hours:  50 ml/hr during first hour, followed by 190 ml/hr for 10 hours then 50 ml/hr for final hour ml/hr.   Add NaCl to TPN to make TPN equivalent to NS conc (154 mEq/L)  Continue 20% fat emulsion at 20 ml/hr for 12 hrs  TPN to contain standard multivitamins and trace elements  IVF per MD; NS 1 L bolus x2 doses after TPN  PLAN      Now: - Magnesium 2g IV x1 dose. - MD placed orders for discharge today - [facility] is consulted and will be providing TPN at home starting today.   Gretta Arab PharmD, BCPS Pager [phone] 08/29/2017 7:26 AM

## 2017-08-29 NOTE — Progress Notes (Signed)
Pt alert and oriented.  D/C instructions and prescriptions were given.  Pt d/cd home with mom.

## 2017-08-29 NOTE — Discharge Summary (Signed)
Physician Discharge Summary  Patient ID: Jacob Galvan. MRN: [id number] DOB/AGE: [birth year] 42 y.o.  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  Since surgery he developed high output ileostomy and partial sbo.  He was placed on TPN and 2 attempts were made to slowly increase his PO intake but he developed severe obstruction or severe dehydration from both attempts.  It was felt that he would need a repeat surgery to correct the problem.  His TPN was cycled for home use until he would be ready for surgery.  He is averaging 2-2.5L of stool output a day and therefore also needs frequent IV fluid replacements as well. 3 CT scans show no sign of infection and only partial sbo in the pelvis.   Consults: None  Significant Diagnostic Studies: labs: cbc, bmet  Treatments: IV hydration, analgesia: oxycodone, TPN and surgery:   Discharge Exam: Blood pressure (!) 86/51, pulse 91, temperature 98.6 F (37 C), temperature source Oral, resp. rate 19, height 6\' 2"  (1.88 m), weight 64 kg (141 lb 1.5 oz), SpO2 100 %. General appearance: alert and cooperative GI: normal findings: soft, non-tender Incision/Wound: clean, dry, intact  Disposition: home with home health   Allergies as of 08/29/2017   No Known Allergies     Medication List    STOP taking these medications   oxymetazoline 0.05 % nasal spray Commonly known as:  AFRIN   silver sulfADIAZINE 1 % cream Commonly known as:  SILVADENE     TAKE these medications   gabapentin 100 MG capsule Commonly known as:  NEURONTIN Take 2 capsules (200 mg total) by mouth 3 (three) times  daily.   ibuprofen 200 MG tablet Commonly known as:  ADVIL,MOTRIN Take 400 mg by mouth as needed.   methocarbamol 500 MG tablet Commonly known as:  ROBAXIN Take 2 tablets (1,000 mg total) by mouth every 6 (six) hours as needed for muscle spasms.   ondansetron 4 MG tablet Commonly known as:  ZOFRAN Take 1 tablet (4 mg total) by mouth every 6 (six) hours as needed for nausea.   oxyCODONE 5 MG immediate release tablet Commonly known as:  Oxy IR/ROXICODONE Take 1-2 tablets (5-10 mg total) by mouth every 4 (four) hours as needed for moderate pain, severe pain or breakthrough pain. What changed:    how much to take  reasons to take this   sodium chloride 0.9 % Inject 1,000 mLs into the vein 2 (two) times daily.            Durable Medical Equipment  (From admission, onward)        Start     Ordered   08/25/17 1316  For home use only DME Cane  Once     08/25/17 1316     Follow-up Information    Leighton Ruff, MD. Schedule an appointment as soon as possible for a visit in 2 week(s).   Specialty:  General Surgery Contact information: [address]  [place] [address] [phone]           Signed: Rosario Adie 3/66/8159, 4:70 AM

## 2017-08-30 ENCOUNTER — Inpatient Hospital Stay: Payer: Medicaid Other | Admitting: Nurse Practitioner

## 2017-08-31 ENCOUNTER — Emergency Department (HOSPITAL_COMMUNITY): Payer: Medicaid Other

## 2017-08-31 ENCOUNTER — Emergency Department (HOSPITAL_COMMUNITY)
Admission: EM | Admit: 2017-08-31 | Discharge: 2017-08-31 | Disposition: A | Discharge status: Home / Self Care | Payer: Medicaid Other | Attending: Emergency Medicine | Admitting: Emergency Medicine

## 2017-08-31 ENCOUNTER — Encounter (HOSPITAL_COMMUNITY): Payer: Self-pay

## 2017-08-31 ENCOUNTER — Other Ambulatory Visit: Payer: Self-pay

## 2017-08-31 DIAGNOSIS — R1110 Vomiting, unspecified: Secondary | ICD-10-CM | POA: Diagnosis present

## 2017-08-31 DIAGNOSIS — Z79899 Other long term (current) drug therapy: Secondary | ICD-10-CM | POA: Insufficient documentation

## 2017-08-31 DIAGNOSIS — E440 Moderate protein-calorie malnutrition: Secondary | ICD-10-CM | POA: Diagnosis present

## 2017-08-31 DIAGNOSIS — K9189 Other postprocedural complications and disorders of digestive system: Secondary | ICD-10-CM

## 2017-08-31 DIAGNOSIS — K56699 Other intestinal obstruction unspecified as to partial versus complete obstruction: Secondary | ICD-10-CM | POA: Insufficient documentation

## 2017-08-31 DIAGNOSIS — F17200 Nicotine dependence, unspecified, uncomplicated: Secondary | ICD-10-CM | POA: Diagnosis not present

## 2017-08-31 DIAGNOSIS — K567 Ileus, unspecified: Secondary | ICD-10-CM | POA: Diagnosis present

## 2017-08-31 DIAGNOSIS — C20 Malignant neoplasm of rectum: Secondary | ICD-10-CM | POA: Diagnosis not present

## 2017-08-31 DIAGNOSIS — Z932 Ileostomy status: Secondary | ICD-10-CM | POA: Diagnosis not present

## 2017-08-31 DIAGNOSIS — R1084 Generalized abdominal pain: Secondary | ICD-10-CM | POA: Diagnosis not present

## 2017-08-31 DIAGNOSIS — K56609 Unspecified intestinal obstruction, unspecified as to partial versus complete obstruction: Secondary | ICD-10-CM

## 2017-08-31 LAB — CBC WITH DIFFERENTIAL/PLATELET
Basophils Absolute: 0 10*3/uL (ref 0.0–0.1)
Basophils Relative: 0 %
EOS ABS: 0.2 10*3/uL (ref 0.0–0.7)
EOS PCT: 2 %
HCT: 31.1 % — ABNORMAL LOW (ref 39.0–52.0)
Hemoglobin: 10.5 g/dL — ABNORMAL LOW (ref 13.0–17.0)
LYMPHS ABS: 0.9 10*3/uL (ref 0.7–4.0)
Lymphocytes Relative: 9 %
MCH: 29.6 pg (ref 26.0–34.0)
MCHC: 33.8 g/dL (ref 30.0–36.0)
MCV: 87.6 fL (ref 78.0–100.0)
MONO ABS: 0.8 10*3/uL (ref 0.1–1.0)
MONOS PCT: 8 %
Neutro Abs: 8.2 10*3/uL — ABNORMAL HIGH (ref 1.7–7.7)
Neutrophils Relative %: 81 %
PLATELETS: 352 10*3/uL (ref 150–400)
RBC: 3.55 MIL/uL — ABNORMAL LOW (ref 4.22–5.81)
RDW: 13.9 % (ref 11.5–15.5)
WBC: 10.2 10*3/uL (ref 4.0–10.5)

## 2017-08-31 LAB — COMPREHENSIVE METABOLIC PANEL
ALK PHOS: 127 U/L — AB (ref 38–126)
ALT: 113 U/L — ABNORMAL HIGH (ref 17–63)
ANION GAP: 8 (ref 5–15)
AST: 47 U/L — ABNORMAL HIGH (ref 15–41)
Albumin: 2.7 g/dL — ABNORMAL LOW (ref 3.5–5.0)
BUN: 12 mg/dL (ref 6–20)
CALCIUM: 8.2 mg/dL — AB (ref 8.9–10.3)
CHLORIDE: 109 mmol/L (ref 101–111)
CO2: 25 mmol/L (ref 22–32)
Creatinine, Ser: 0.72 mg/dL (ref 0.61–1.24)
GFR calc non Af Amer: >60 mL/min (ref >60)
Glucose, Bld: 187 mg/dL — ABNORMAL HIGH (ref 65–99)
Potassium: 4.2 mmol/L (ref 3.5–5.1)
SODIUM: 142 mmol/L (ref 135–145)
Total Bilirubin: 0.4 mg/dL (ref 0.3–1.2)
Total Protein: 5.9 g/dL — ABNORMAL LOW (ref 6.5–8.1)

## 2017-08-31 LAB — I-STAT CG4 LACTIC ACID, ED: Lactic Acid, Venous: 1.24 mmol/L (ref 0.5–1.9)

## 2017-08-31 LAB — LIPASE, BLOOD: LIPASE: 58 U/L — AB (ref 11–51)

## 2017-08-31 MED ORDER — IOPAMIDOL (ISOVUE-300) INJECTION 61%
100.0000 mL | Freq: Once | INTRAVENOUS | Status: AC | PRN
Start: 2017-08-31 — End: 2017-08-31
  Administered 2017-08-31: 100 mL via INTRAVENOUS

## 2017-08-31 MED ORDER — MAGIC MOUTHWASH: 15.0000 mL | Freq: Four times a day (QID) | ORAL | Status: DC | PRN

## 2017-08-31 MED ORDER — METOPROLOL TARTRATE 5 MG/5ML IV SOLN: 5.0000 mg | Freq: Four times a day (QID) | INTRAVENOUS | Status: DC | PRN

## 2017-08-31 MED ORDER — GUAIFENESIN-DM 100-10 MG/5ML PO SYRP: 10.0000 mL | ORAL_SOLUTION | ORAL | Status: DC | PRN

## 2017-08-31 MED ORDER — MENTHOL 3 MG MT LOZG: 1.0000 | LOZENGE | OROMUCOSAL | Status: DC | PRN

## 2017-08-31 MED ORDER — PHENOL 1.4 % MT LIQD: 1.0000 | OROMUCOSAL | Status: DC | PRN

## 2017-08-31 MED ORDER — SODIUM CHLORIDE 0.9 % IV BOLUS
1000.0000 mL | Freq: Once | INTRAVENOUS | Status: AC
  Administered 2017-08-31: 1000 mL via INTRAVENOUS

## 2017-08-31 MED ORDER — LACTATED RINGERS IV BOLUS: 1000.0000 mL | Freq: Three times a day (TID) | INTRAVENOUS | Status: DC | PRN

## 2017-08-31 MED ORDER — MORPHINE SULFATE (PF) 4 MG/ML IV SOLN
4.0000 mg | Freq: Once | INTRAVENOUS | Status: AC
  Administered 2017-08-31: 4 mg via INTRAVENOUS
  Filled 2017-08-31: qty 1

## 2017-08-31 MED ORDER — HYDROMORPHONE HCL 1 MG/ML IJ SOLN
0.5000 mg | INTRAMUSCULAR | Status: DC | PRN
  Administered 2017-08-31 (×2): 2 mg via INTRAVENOUS
  Administered 2017-08-31: 1 mg via INTRAVENOUS
  Filled 2017-08-31: qty 1
  Filled 2017-08-31 (×2): qty 2

## 2017-08-31 MED ORDER — LORAZEPAM 2 MG/ML IJ SOLN: 0.5000 mg | Freq: Three times a day (TID) | INTRAMUSCULAR | Status: DC | PRN

## 2017-08-31 MED ORDER — ALUM & MAG HYDROXIDE-SIMETH 200-200-20 MG/5ML PO SUSP: 30.0000 mL | Freq: Four times a day (QID) | ORAL | Status: DC | PRN

## 2017-08-31 MED ORDER — ONDANSETRON HCL 4 MG/2ML IJ SOLN
4.0000 mg | Freq: Four times a day (QID) | INTRAMUSCULAR | Status: DC
  Administered 2017-08-31: 4 mg via INTRAVENOUS
  Filled 2017-08-31: qty 2

## 2017-08-31 MED ORDER — HYDROCORTISONE 2.5 % RE CREA: 1.0000 "application " | TOPICAL_CREAM | Freq: Four times a day (QID) | RECTAL | Status: DC | PRN

## 2017-08-31 MED ORDER — DEXTROSE 5 % IV SOLN: 1000.0000 mg | Freq: Four times a day (QID) | INTRAVENOUS | Status: DC | PRN

## 2017-08-31 MED ORDER — LACTATED RINGERS IV BOLUS
1000.0000 mL | Freq: Once | INTRAVENOUS | Status: AC
  Administered 2017-08-31: 1000 mL via INTRAVENOUS

## 2017-08-31 MED ORDER — PROCHLORPERAZINE EDISYLATE 10 MG/2ML IJ SOLN: 5.0000 mg | INTRAMUSCULAR | Status: DC | PRN

## 2017-08-31 MED ORDER — ONDANSETRON HCL 4 MG/2ML IJ SOLN
4.0000 mg | Freq: Once | INTRAMUSCULAR | Status: AC
  Administered 2017-08-31: 4 mg via INTRAVENOUS
  Filled 2017-08-31: qty 2

## 2017-08-31 MED ORDER — METOCLOPRAMIDE HCL 5 MG/ML IJ SOLN: 10.0000 mg | Freq: Four times a day (QID) | INTRAMUSCULAR | Status: DC | PRN

## 2017-08-31 MED ORDER — DIPHENHYDRAMINE HCL 50 MG/ML IJ SOLN: 12.5000 mg | Freq: Four times a day (QID) | INTRAMUSCULAR | Status: DC | PRN

## 2017-08-31 MED ORDER — LIP MEDEX EX OINT: 1.0000 "application " | TOPICAL_OINTMENT | Freq: Two times a day (BID) | CUTANEOUS | Status: DC

## 2017-08-31 MED ORDER — HYDROCORTISONE 1 % EX CREA: 1.0000 "application " | TOPICAL_CREAM | Freq: Three times a day (TID) | CUTANEOUS | Status: DC | PRN

## 2017-08-31 MED ORDER — IOPAMIDOL (ISOVUE-300) INJECTION 61%
INTRAVENOUS | Status: AC
  Filled 2017-08-31: qty 100

## 2017-08-31 NOTE — Care Management Note (Signed)
Case Management Note  CM consulted by Sophia, PA for to advise of continuation of [facility] orders per Dr. Marcello Moores.  Pt is active with [facility].  Contacted Pam with [facility] to advise of pt's D/C from the ED.  No further CM needs noted at this time.

## 2017-08-31 NOTE — ED Notes (Signed)
Bed: JS31 Expected date:  Expected time:  Means of arrival:  Comments: 42 yo M  Cancer pt

## 2017-08-31 NOTE — Progress Notes (Signed)
[facility]  [address] [place], [place] [address] Phone: [phone]  FAX: [phone]      Jacob Galvan [id number] [birth year]  CARE TEAM:  PCP: Patient, No Pcp Per  Outpatient Care Team: Patient Care Team: Patient, No Pcp Per as PCP - General (General Practice) Leighton Ruff, MD as Consulting Physician (General Surgery) Ladell Pier, MD as Consulting Physician (Oncology)  Inpatient Treatment Team: Treatment Team: Attending Provider: Ward, Delice Bison, DO; Registered Nurse: Raechel Ache, RN; Technician: Cephas Darby, NT; Physician Assistant: Delaine Lame; Registered Nurse: Tamera Reason, RN; Consulting Physician: Leighton Ruff, MD   Problem List:   Principal Problem:   SBO (small bowel obstruction) [facility]) Active Problems:   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  Plan:  -NPO w sips of clears/ice chips -TNA for malnutrition & ileus -Pain control.  PRN Robaxin, narcotics -Very low pelvic fluid collection near the anastomosis seems small and stable.  No gas.  No leukocytosis or fever.  Infection.  Would hold off on antibiotics.  Defer to Dr. Marcello Moores, operating surgeon.  He may be reaching a point of futility where his partial small bowel obstruction and ileus-like symptoms will not resolve without surgery.  While the CT scan does not show any radical changes in the past 10 days, and shows no improvement.  He is having difficulty even tolerating  liquids now.  Bounced back after being in the hospital for several weeks.  We have been trying to hold off given the fact that it is in the early postoperative phase in order to minimize enterotomy/serosal injury with early repeated surgery after major low anterior resection only 6 weeks ago.  Will defer to Dr. Marcello Moores to see if need to proceed with surgery.  Patient frustrated and wishes to go ahead.  We will try to empathize with his understandable frustration with his cancer and challenging recovery despite comprehensive and aggressive care, I did caution against rushing in there, but will defer discussion with his surgeon.   -VTE prophylaxis- SCDs, etc -mobilize as tolerated to help recovery  30 minutes spent in review, evaluation, examination, counseling, and coordination of care.  More than 50% of that time was spent in counseling.  [name], M.D., F.A.C.S. Gastrointestinal and Minimally Invasive Surgery [facility] [address], [place], [place] [address] [phone] Main / Paging   08/31/2017    Subjective: (Chief complaint)  Patient had uncontrolled vomiting at home.  Cannot even keep pills down.  Worsening crampy abdominal pain.  Came to emergency room.  CT scan implies high-grade small bowel obstruction with transition zone down in the pelvis there is very low pelvic anastomosis.  Objective:  Vital signs:  Vitals:   08/31/17 0300 08/31/17 0345 08/31/17 0430 08/31/17 0547  BP: 117/68 1_0  Pulse: (!) 108 (!) 104 93 93  Resp: (!) _1 Temp:      TempSrc:      SpO2: 100% 100% 100% 100%  Weight:  Height:           Intake/Output   Yesterday:  No intake/output data recorded. This shift:  No intake/output data recorded.  Bowel function:  Flatus: YES  BM:  YES  Drain: (No drain)   Physical Exam:  General: Pt awake/alert/oriented x4 in mild acute distress.  Thin.  Mildly cachectic Eyes:  PERRL, normal EOM.  Sclera clear.  No icterus Neuro: CN II-XII intact w/o focal sensory/motor deficits. Lymph: No head/neck/groin lymphadenopathy Psych:  No delerium/psychosis/paranoia HENT: Normocephalic, Mucus membranes moist.  No thrush Neck: Supple, No tracheal deviation Chest: No chest wall pain w good excursion CV:  Pulses intact.  Regular rhythm MS: Normal AROM mjr joints.  No obvious deformity  Abdomen: Soft.  Mildy distended.  Mildly tender at incisions only.  Ileostomy with scant flatus and succus in bag no evidence of peritonitis.  No incarcerated hernias.  Ext:  No deformity.  No mjr edema.  No cyanosis Skin: No petechiae / purpura  Results:   Labs: Results for orders placed or performed during the hospital encounter of 08/31/17 (from the past 48 hour(s))  CBC with Differential/Platelet     Status: Abnormal   Collection Time: 08/31/17  3:48 AM  Result Value Ref Range   WBC 10.2 4.0 - 10.5 K/uL   RBC 3.55 (L) 4.22 - 5.81 MIL/uL   Hemoglobin 10.5 (L) 13.0 - 17.0 g/dL   HCT 31.1 (L) 39.0 - 52.0 %   MCV 87.6 78.0 - 100.0 fL   MCH 29.6 26.0 - 34.0 pg   MCHC 33.8 30.0 - 36.0 g/dL   RDW 13.9 11.5 - 15.5 %   Platelets 352 150 - 400 K/uL   Neutrophils Relative % 81 %   Neutro Abs 8.2 (H) 1.7 - 7.7 K/uL   Lymphocytes Relative 9 %   Lymphs Abs 0.9 0.7 - 4.0 K/uL   Monocytes Relative 8 %   Monocytes Absolute 0.8 0.1 - 1.0 K/uL   Eosinophils Relative 2 %   Eosinophils Absolute 0.2 0.0 - 0.7 K/uL   Basophils Relative 0 %   Basophils Absolute 0.0 0.0 - 0.1 K/uL    Comment: Performed at [facility], [place] [address], [place], [place] [address]  Comprehensive metabolic panel     Status: Abnormal   Collection Time: 08/31/17  3:48 AM  Result Value Ref Range   Sodium 142 135 - 145 mmol/L   Potassium 4.2 3.5 - 5.1 mmol/L   Chloride 109 101 - 111 mmol/L   CO2 25 22 - 32 mmol/L   Glucose, Bld 187 (H) 65 - 99 mg/dL   BUN 12 6 - 20 mg/dL   Creatinine, Ser 0.72  0.61 - 1.24 mg/dL   Calcium 8.2 (L) 8.9 - 10.3 mg/dL   Total Protein 5.9 (L) 6.5 - 8.1 g/dL   Albumin 2.7 (L) 3.5 - 5.0 g/dL   AST 47 (H) 15 - 41 U/L   ALT 113 (H) 17 - 63 U/L   Alkaline Phosphatase 127 (H) 38 - 126 U/L   Total Bilirubin 0.4 0.3 - 1.2 mg/dL   GFR calc non Af Amer >60 >60 mL/min   GFR calc Af Amer >60 >60 mL/min    Comment: (NOTE) The eGFR has been calculated using the CKD EPI equation. This calculation has not been validated in all clinical situations. eGFR's persistently <60 mL/min signify possible Chronic Kidney Disease.    Anion gap 8 5 - 15    Comment: Performed at [facility]  [place] [address], [place], [place] [address]  Lipase, blood     Status: Abnormal   Collection Time: 08/31/17  3:48 AM  Result Value Ref Range   Lipase 58 (H) 11 - 51 U/L    Comment: Performed at [facility], [place] [address] [place], [place] [address]  I-Stat CG4 Lactic Acid, ED     Status: None   Collection Time: 08/31/17  4:01 AM  Result Value Ref Range   Lactic Acid, Venous 1.24 0.5 - 1.9 mmol/L    Imaging / Studies: Ct Abdomen Pelvis W Contrast  Result Date: 08/31/2017 CLINICAL DATA:  Acute onset of generalized abdominal pain, nausea and vomiting. EXAM: CT ABDOMEN AND PELVIS WITH CONTRAST TECHNIQUE: Multidetector CT imaging of the abdomen and pelvis was performed using the standard protocol following bolus administration of intravenous contrast. CONTRAST:  126m ISOVUE-300 IOPAMIDOL (ISOVUE-300) INJECTION 61% COMPARISON:  None. FINDINGS: Lower chest: Minimal bibasilar atelectasis is noted. Scattered coronary artery calcification is noted. Wall thickening at the distal esophagus may reflect chronic inflammation or esophagitis. Hepatobiliary: A 1.5 cm hypodensity is noted at the hepatic dome. The liver is otherwise unremarkable. The gallbladder is not well assessed given surrounding ascites. The common bile duct remains normal in caliber. Pancreas: The  pancreas is within normal limits. Spleen: The spleen is unremarkable in appearance. Adrenals/Urinary Tract: The adrenal glands are unremarkable in appearance. The kidneys are within normal limits. There is no evidence of hydronephrosis. No renal or ureteral stones are identified. No perinephric stranding is seen. Stomach/Bowel: The patient is status post diverting loop ileostomy at the terminal ileum. There is diffuse distention of small-bowel loops to 5.6 cm in maximal diameter, with multiple transition points at the pelvis due to extensive scarring and adhesions. Findings are compatible with relatively high-grade small bowel obstruction. The colon is decompressed. Postoperative change is noted at the level of the rectum. Trace fluid about the appendix likely tracks from the small bowel process. The appendix is grossly unremarkable in appearance. The stomach is partially filled with fluid and grossly unremarkable. Vascular/Lymphatic: The abdominal aorta is unremarkable in appearance. The inferior vena cava is grossly unremarkable. Mildly prominent retroperitoneal nodes measure up to 1.0 cm in short axis. No pelvic sidewall lymphadenopathy is identified. Reproductive: The prostate is not well characterized. The bladder is mildly distended and grossly unremarkable. Other: No additional soft tissue abnormalities are seen. Musculoskeletal: No acute osseous abnormalities are identified. The visualized musculature is unremarkable in appearance. IMPRESSION: 1. Diffuse distention of small-bowel loops to 5.6 cm in maximal diameter, with multiple transition points at the pelvis due to extensive scarring and adhesions. Findings compatible with relatively high-grade small bowel obstruction. 2. Wall thickening at the distal esophagus may reflect chronic inflammation or esophagitis. 3. Diverting loop ileostomy at the terminal ileum is unremarkable in appearance. The colon is decompressed. 4. Scattered coronary artery  calcifications seen. 5. 1.5 cm nonspecific hypodensity at the hepatic dome. These results were called by telephone at the time of interpretation on 08/31/2017 at 5:31 am to [facility], who verbally acknowledged these results. Electronically Signed   By: JGarald BaldingM.D.   On: 08/31/2017 05:33   Dg Abd Portable 1v  Result Date: 08/29/2017 CLINICAL DATA:  Follow-up small bowel obstruction. EXAM: PORTABLE ABDOMEN - 1 VIEW COMPARISON:  08/20/2017 FINDINGS: Continued significant dilatation of a single bowel loop centrally in the abdomen. Right lower quadrant ostomy noted. No free air organomegaly. IMPRESSION: Continued significant gaseous distention of a  single bowel loop in the central abdomen, presumably small bowel. Electronically Signed   By: Rolm Baptise M.D.   On: 08/29/2017 10:09    Medications / Allergies: per chart  Antibiotics: Anti-infectives (From admission, onward)   None        Note: Portions of this report may have been transcribed using voice recognition software. Every effort was made to ensure accuracy; however, inadvertent computerized transcription errors may be present.   Any transcriptional errors that result from this process are unintentional.     [name], M.D., F.A.C.S. Gastrointestinal and Minimally Invasive Surgery [facility] [address] [place], [place] [phone] Main / Paging   08/31/2017

## 2017-08-31 NOTE — ED Triage Notes (Signed)
Per EMS, pt comes in this evening due to vomiting and abdominal pain. He also c/o chest tenderness and states to EMS it "hurts to breath." Pt was just recently discharged from the [facility] after a colostomy surgery.   Port-a-cath on R side of chest.

## 2017-08-31 NOTE — ED Provider Notes (Signed)
[facility] DEPT Provider Note   CSN: [id number] Arrival date & time: 08/31/17  0240     History   Chief Complaint Chief Complaint  Patient presents with  . Emesis    HPI Jacob Galvan. is a 42 y.o. male.  Patient with past medical history of colorectal cancer with diverting ileostomy who was recently discharged from the hospital yesterday presents to the emergency department with increased abdominal pain and vomiting.  His hospital stay was complicated by partial small bowel obstructions when the patient was PO challenged.  He was put on TPN, and has been continuing this.  States that he drank some water and took his pain medicine by mouth and she reports having severe abdominal pain and vomiting.  He denies any fever.    The history is provided by the patient. No language interpreter was used.    Past Medical History:  Diagnosis Date  . Headache   . Rectal cancer ([place] [facility]) 2018   radiation and Chemo    Patient Active Problem List   Diagnosis Date Noted  . Ileostomy in place [facility]) 07/30/2017  . Ileus, postoperative ([place]) 07/30/2017  . Hypokalemia 07/30/2017  . Protein-calorie malnutrition, severe 07/29/2017  . Genetic testing 04/17/2017  . Port-A-Cath in place 04/17/2017  . Family history of colon cancer 03/29/2017  . Family history of melanoma 03/29/2017  . Family history of pancreatic cancer 03/29/2017  . Family history of testicular cancer 03/29/2017  . 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 COLONOSCOPY    . LAPAROSCOPIC DIVERTED COLOSTOMY N/A 07/20/2017   Procedure: DIVERTING LOOP COLOSTOMY;  Surgeon: Leighton Ruff, MD;  Location: [facility] ORS;  Service: General;  Laterality: N/A;  . PORTA CATH INSERTION    . WISDOM TOOTH EXTRACTION    . XI ROBOTIC ASSISTED LOWER ANTERIOR RESECTION N/A 07/20/2017   Procedure: XI ROBOTIC ASSISTED LOWER ANTERIOR RESECTION  ERAS PATHWAY;  Surgeon: Leighton Ruff, MD;  Location: [facility] ORS;  Service: General;  Laterality: N/A;        Home Medications    Prior to Admission medications   Medication Sig Start Date End Date Taking? Authorizing Provider  gabapentin (NEURONTIN) 100 MG capsule Take 2 capsules (200 mg total) by mouth 3 (three) times daily. 6/72/09  Yes Leighton Ruff, MD  ibuprofen (ADVIL,MOTRIN) 200 MG tablet Take 400 mg by mouth as needed for moderate pain.    Yes [provider]  methocarbamol (ROBAXIN) 500 MG tablet Take 2 tablets (1,000 mg total) by mouth every 6 (six) hours as needed for muscle spasms. 4/70/96  Yes Leighton Ruff, MD  ondansetron (ZOFRAN) 4 MG tablet Take 1 tablet (4 mg total) by mouth every 6 (six) hours as needed for nausea. 2/83/66  Yes Leighton Ruff, MD  oxyCODONE (OXY IR/ROXICODONE) 5 MG immediate release tablet Take 1-2 tablets (5-10 mg total) by mouth every 4 (four) hours as needed for moderate pain, severe pain or breakthrough pain. 2/94/76  Yes Leighton Ruff, MD  sodium chloride 0.9 % Inject 1,000 mLs into the vein 2 (two) times daily. 5/46/50  Yes Leighton Ruff, MD    Family History Family History  Problem Relation Age of Onset  . Diabetes Other   . Diabetes Maternal Grandmother   . Colon cancer Paternal Grandfather   . Pancreatic cancer Paternal Grandfather 16  . Breast cancer Paternal Aunt 25  . Melanoma Sister 44  x2  . Testicular cancer Maternal Grandfather 4  . COPD Paternal Grandmother   . Colon cancer Paternal Uncle 33  . Pancreatic cancer Paternal Uncle   . Testicular cancer Other   . Heart Problems Son 5    Social History Social History   Tobacco Use  . Smoking status: Current Some Day Smoker  . Smokeless tobacco: Never Used  Substance Use Topics  . Alcohol use: Yes    Comment: occ  . Drug use: Yes    Types: Marijuana    Comment: daily-last time was a week ago from 07/18/2017     Allergies   Patient has no known  allergies.   Review of Systems Review of Systems  All other systems reviewed and are negative.    Physical Exam Updated Vital Signs BP 119/67 (BP Location: Left Arm)   Pulse (!) 108   Temp 98.1 F (36.7 C) (Oral)   Resp 16   Ht 6\' 2"  (1.88 m)   Wt 64 kg (141 lb)   SpO2 100%   BMI 18.10 kg/m   Physical Exam  Constitutional: He is oriented to person, place, and time. He appears well-developed and well-nourished.  HENT:  Head: Normocephalic and atraumatic.  Eyes: Pupils are equal, round, and reactive to light. Conjunctivae and EOM are normal. Right eye exhibits no discharge. Left eye exhibits no discharge. No scleral icterus.  Neck: Normal range of motion. Neck supple. No JVD present.  Cardiovascular: Normal rate, regular rhythm and normal heart sounds. Exam reveals no gallop and no friction rub.  No murmur heard. Pulmonary/Chest: Effort normal and breath sounds normal. No respiratory distress. He has no wheezes. He has no rales. He exhibits no tenderness.  Abdominal: Soft. He exhibits no distension and no mass. There is tenderness. There is no rebound and no guarding.  Ileostomy in right lower quadrant Generalized abdominal discomfort  Musculoskeletal: Normal range of motion. He exhibits no edema or tenderness.  Neurological: He is alert and oriented to person, place, and time.  Skin: Skin is warm and dry.  Psychiatric: He has a normal mood and affect. His behavior is normal. Judgment and thought content normal.  Nursing note and vitals reviewed.    ED Treatments / Results  Labs (all labs ordered are listed, but only abnormal results are displayed) Labs Reviewed  CBC WITH DIFFERENTIAL/PLATELET  COMPREHENSIVE METABOLIC PANEL  LIPASE, BLOOD  I-STAT CG4 LACTIC ACID, ED    EKG None  Radiology Dg Abd Portable 1v  Result Date: 08/29/2017 CLINICAL DATA:  Follow-up small bowel obstruction. EXAM: PORTABLE ABDOMEN - 1 VIEW COMPARISON:  08/20/2017 FINDINGS: Continued  significant dilatation of a single bowel loop centrally in the abdomen. Right lower quadrant ostomy noted. No free air organomegaly. IMPRESSION: Continued significant gaseous distention of a single bowel loop in the central abdomen, presumably small bowel. Electronically Signed   By: Rolm Baptise M.D.   On: 08/29/2017 10:09    Procedures Procedures (including critical care time)  Medications Ordered in ED Medications  morphine 4 MG/ML injection 4 mg (has no administration in time range)  ondansetron (ZOFRAN) injection 4 mg (has no administration in time range)     Initial Impression / Assessment and Plan / ED Course  I have reviewed the triage vital signs and the nursing notes.  Pertinent labs & imaging results that were available during my care of the patient were reviewed by me and considered in my medical decision making (see chart for details).  She with complex past medical history including recent colon resection complicated by ileus.  He was just discharged home yesterday.  He has had nausea and vomiting with significant abdominal pain.  He gets most of his nutrition via TPN, however he did have some oral intake yesterday.  Than his high risk for small bowel obstruction and significant pain, will check CT.  CT shows high-grade bowel obstruction.  Discussed case with Dr. Johney Maine, he will have someone come to see the patient.  Recommends placing NG tube and giving fluids.  Dispo per surgery.    Final Clinical Impressions(s) / ED Diagnoses   Final diagnoses:  SBO (small bowel obstruction) [facility])    ED Discharge Orders    None       Montine Circle, PA-C 08/31/17 [place], Delice Bison, DO 08/31/17 [phone]

## 2017-08-31 NOTE — Progress Notes (Signed)
Patient examined and labs and CT reviewed.  There is no change in his health since he was discharged.  Patient thinks that his nausea was due to heartburn.  I recommended that he take Prilosec or Nexium on a daily basis.  I recommend that he go back to strict n.p.o. with only ice chips and sips of clears with medications.  If he does these things, he should be able to go back home with minimal nausea and vomiting.  He does not wish to be admitted to the hospital and I do not think he has a medical need to be admitted.  We are working on scheduling his surgery as an outpatient in early June.  Please notify home health that he will be returning home and discharge patient.  Rosario Adie, MD  Colorectal and [facility]

## 2017-08-31 NOTE — ED Notes (Signed)
Patient refuses NG tube, Rob, PA-C is aware.

## 2017-08-31 NOTE — Discharge Instructions (Signed)
Pick up nexium

## 2017-08-31 NOTE — ED Provider Notes (Signed)
Patient Was seen by Dr. Leighton Ruff.  She is the surgeon who follows patient.  She reports after examining the patient and reviewing his scan she does not feel that there is any change.  She is advised patient that he must remain n.p.o. except for ice chips and small sips of fluid with pills.  He feels that he can go home and follow-up for his surgery as scheduled.  I spoke with patient he agrees with plan.  She is advised to call Dr. Marcello Moores if he has further problems or questions   Jacob Galvan 08/31/17 0935    Charlesetta Shanks, MD 09/01/17 [phone]

## 2017-08-31 NOTE — Progress Notes (Signed)
[name] is an active pt with [facility] for home TPN/hydration.  [facility] team will follow pt while an inpatient to support home care needs at DC as ordered by MD team.  If patient discharges after hours, please call [phone].   [name] 08/31/2017, 8:24 AM

## 2017-08-31 NOTE — Progress Notes (Signed)
CSW consulted for [facility] services needs. CSW has left VM for RNCM Levada Dy in [facility] ED asking that she set up [facility] services for pt. There are no further CSW needs. CSW will sign off.

## 2017-09-01 ENCOUNTER — Inpatient Hospital Stay (HOSPITAL_COMMUNITY)
Admission: EM | Admit: 2017-09-01 | Discharge: 2017-09-10 | DRG: 641 | Disposition: A | Discharge status: Home Health | Payer: Medicaid Other | Attending: General Surgery | Admitting: General Surgery

## 2017-09-01 ENCOUNTER — Encounter (HOSPITAL_COMMUNITY): Payer: Self-pay | Admitting: Nurse Practitioner

## 2017-09-01 DIAGNOSIS — Z833 Family history of diabetes mellitus: Secondary | ICD-10-CM

## 2017-09-01 DIAGNOSIS — K56609 Unspecified intestinal obstruction, unspecified as to partial versus complete obstruction: Secondary | ICD-10-CM | POA: Diagnosis present

## 2017-09-01 DIAGNOSIS — Z6820 Body mass index (BMI) 20.0-20.9, adult: Secondary | ICD-10-CM

## 2017-09-01 DIAGNOSIS — I959 Hypotension, unspecified: Secondary | ICD-10-CM | POA: Diagnosis present

## 2017-09-01 DIAGNOSIS — Z85048 Personal history of other malignant neoplasm of rectum, rectosigmoid junction, and anus: Secondary | ICD-10-CM

## 2017-09-01 DIAGNOSIS — E46 Unspecified protein-calorie malnutrition: Secondary | ICD-10-CM | POA: Diagnosis present

## 2017-09-01 DIAGNOSIS — T80219A Unspecified infection due to central venous catheter, initial encounter: Secondary | ICD-10-CM | POA: Diagnosis not present

## 2017-09-01 DIAGNOSIS — Z933 Colostomy status: Secondary | ICD-10-CM

## 2017-09-01 DIAGNOSIS — Z808 Family history of malignant neoplasm of other organs or systems: Secondary | ICD-10-CM

## 2017-09-01 DIAGNOSIS — Z825 Family history of asthma and other chronic lower respiratory diseases: Secondary | ICD-10-CM

## 2017-09-01 DIAGNOSIS — E860 Dehydration: Principal | ICD-10-CM | POA: Diagnosis present

## 2017-09-01 DIAGNOSIS — Z803 Family history of malignant neoplasm of breast: Secondary | ICD-10-CM

## 2017-09-01 DIAGNOSIS — Z8043 Family history of malignant neoplasm of testis: Secondary | ICD-10-CM

## 2017-09-01 DIAGNOSIS — R509 Fever, unspecified: Secondary | ICD-10-CM

## 2017-09-01 DIAGNOSIS — F17200 Nicotine dependence, unspecified, uncomplicated: Secondary | ICD-10-CM | POA: Diagnosis present

## 2017-09-01 DIAGNOSIS — Y848 Other medical procedures as the cause of abnormal reaction of the patient, or of later complication, without mention of misadventure at the time of the procedure: Secondary | ICD-10-CM | POA: Diagnosis not present

## 2017-09-01 DIAGNOSIS — Z800 Family history of malignant neoplasm of digestive organs: Secondary | ICD-10-CM

## 2017-09-01 LAB — CBC WITH DIFFERENTIAL/PLATELET
Basophils Absolute: 0 10*3/uL (ref 0.0–0.1)
Basophils Relative: 0 %
Eosinophils Absolute: 0 10*3/uL (ref 0.0–0.7)
Eosinophils Relative: 0 %
HCT: 29.1 % — ABNORMAL LOW (ref 39.0–52.0)
Hemoglobin: 9.5 g/dL — ABNORMAL LOW (ref 13.0–17.0)
Lymphocytes Relative: 2 %
Lymphs Abs: 0.3 10*3/uL — ABNORMAL LOW (ref 0.7–4.0)
MCH: 29.1 pg (ref 26.0–34.0)
MCHC: 32.6 g/dL (ref 30.0–36.0)
MCV: 89 fL (ref 78.0–100.0)
Monocytes Absolute: 0.6 10*3/uL (ref 0.1–1.0)
Monocytes Relative: 4 %
Neutro Abs: 13 10*3/uL — ABNORMAL HIGH (ref 1.7–7.7)
Neutrophils Relative %: 94 %
Platelets: 242 10*3/uL (ref 150–400)
RBC: 3.27 MIL/uL — ABNORMAL LOW (ref 4.22–5.81)
RDW: 14.3 % (ref 11.5–15.5)
WBC: 13.9 10*3/uL — ABNORMAL HIGH (ref 4.0–10.5)

## 2017-09-01 LAB — GLUCOSE, CAPILLARY: GLUCOSE-CAPILLARY: 145 mg/dL — AB (ref 65–99)

## 2017-09-01 LAB — URINALYSIS, ROUTINE W REFLEX MICROSCOPIC
Bilirubin Urine: NEGATIVE
Glucose, UA: NEGATIVE mg/dL
Hgb urine dipstick: NEGATIVE
Ketones, ur: NEGATIVE mg/dL
Leukocytes, UA: NEGATIVE
Nitrite: NEGATIVE
Protein, ur: NEGATIVE mg/dL
Specific Gravity, Urine: 1.014 (ref 1.005–1.030)
pH: 5 (ref 5.0–8.0)

## 2017-09-01 LAB — COMPREHENSIVE METABOLIC PANEL
ALT: 98 U/L — ABNORMAL HIGH (ref 17–63)
AST: 51 U/L — ABNORMAL HIGH (ref 15–41)
Albumin: 2.5 g/dL — ABNORMAL LOW (ref 3.5–5.0)
Alkaline Phosphatase: 134 U/L — ABNORMAL HIGH (ref 38–126)
Anion gap: 12 (ref 5–15)
BUN: 15 mg/dL (ref 6–20)
CO2: 21 mmol/L — ABNORMAL LOW (ref 22–32)
Calcium: 8.2 mg/dL — ABNORMAL LOW (ref 8.9–10.3)
Chloride: 112 mmol/L — ABNORMAL HIGH (ref 101–111)
Creatinine, Ser: 0.99 mg/dL (ref 0.61–1.24)
GFR calc Af Amer: >60 mL/min (ref >60)
GFR calc non Af Amer: >60 mL/min (ref >60)
Glucose, Bld: 160 mg/dL — ABNORMAL HIGH (ref 65–99)
Potassium: 3.4 mmol/L — ABNORMAL LOW (ref 3.5–5.1)
Sodium: 145 mmol/L (ref 135–145)
Total Bilirubin: 0.7 mg/dL (ref 0.3–1.2)
Total Protein: 5.7 g/dL — ABNORMAL LOW (ref 6.5–8.1)

## 2017-09-01 LAB — I-STAT CG4 LACTIC ACID, ED: Lactic Acid, Venous: 3.94 mmol/L (ref 0.5–1.9)

## 2017-09-01 LAB — LIPASE, BLOOD: Lipase: 38 U/L (ref 11–51)

## 2017-09-01 LAB — MAGNESIUM: Magnesium: 1.3 mg/dL — ABNORMAL LOW (ref 1.7–2.4)

## 2017-09-01 LAB — PHOSPHORUS: PHOSPHORUS: 4.6 mg/dL (ref 2.5–4.6)

## 2017-09-01 MED ORDER — OXYCODONE HCL 5 MG PO TABS
5.0000 mg | ORAL_TABLET | ORAL | Status: DC | PRN
  Administered 2017-09-01 – 2017-09-09 (×18): 10 mg via ORAL
  Administered 2017-09-10: 5 mg via ORAL
  Administered 2017-09-10 (×2): 10 mg via ORAL
  Administered 2017-09-10: 5 mg via ORAL
  Filled 2017-09-01: qty 2
  Filled 2017-09-01: qty 1
  Filled 2017-09-01 (×11): qty 2
  Filled 2017-09-01: qty 1
  Filled 2017-09-01 (×10): qty 2

## 2017-09-01 MED ORDER — POTASSIUM CHLORIDE IN NACL 20-0.9 MEQ/L-% IV SOLN
INTRAVENOUS | Status: DC
  Administered 2017-09-02 – 2017-09-06 (×8): via INTRAVENOUS
  Filled 2017-09-01 (×9): qty 1000

## 2017-09-01 MED ORDER — PROMETHAZINE HCL 25 MG/ML IJ SOLN
12.5000 mg | Freq: Four times a day (QID) | INTRAMUSCULAR | Status: DC | PRN
  Administered 2017-09-02 – 2017-09-10 (×23): 12.5 mg via INTRAVENOUS
  Filled 2017-09-01 (×24): qty 1

## 2017-09-01 MED ORDER — METHOCARBAMOL 500 MG PO TABS
1000.0000 mg | ORAL_TABLET | Freq: Four times a day (QID) | ORAL | Status: DC | PRN
  Administered 2017-09-05 – 2017-09-10 (×3): 1000 mg via ORAL
  Filled 2017-09-01 (×3): qty 2

## 2017-09-01 MED ORDER — HYDROMORPHONE HCL 1 MG/ML IJ SOLN
1.0000 mg | INTRAMUSCULAR | Status: DC | PRN
  Administered 2017-09-01 – 2017-09-06 (×50): 1 mg via INTRAVENOUS
  Filled 2017-09-01 (×50): qty 1

## 2017-09-01 MED ORDER — SODIUM CHLORIDE 0.9% FLUSH
10.0000 mL | INTRAVENOUS | Status: DC | PRN
  Administered 2017-09-08: 3 mL
  Filled 2017-09-01: qty 40

## 2017-09-01 MED ORDER — ENOXAPARIN SODIUM 40 MG/0.4ML ~~LOC~~ SOLN
40.0000 mg | SUBCUTANEOUS | Status: DC
  Administered 2017-09-01 – 2017-09-09 (×7): 40 mg via SUBCUTANEOUS
  Filled 2017-09-01 (×8): qty 0.4

## 2017-09-01 MED ORDER — HYDROMORPHONE HCL 1 MG/ML IJ SOLN: 0.2500 mg | Freq: Once | INTRAMUSCULAR | Status: DC

## 2017-09-01 MED ORDER — PROMETHAZINE HCL 25 MG/ML IJ SOLN
12.5000 mg | Freq: Once | INTRAMUSCULAR | Status: AC
  Administered 2017-09-01: 12.5 mg via INTRAVENOUS
  Filled 2017-09-01: qty 1

## 2017-09-01 MED ORDER — MAGNESIUM SULFATE 4 GM/100ML IV SOLN
4.0000 g | Freq: Once | INTRAVENOUS | Status: AC
  Administered 2017-09-01: 4 g via INTRAVENOUS
  Filled 2017-09-01 (×2): qty 100

## 2017-09-01 MED ORDER — INSULIN ASPART 100 UNIT/ML ~~LOC~~ SOLN
0.0000 [IU] | Freq: Four times a day (QID) | SUBCUTANEOUS | Status: DC
  Administered 2017-09-02 – 2017-09-03 (×3): 1 [IU] via SUBCUTANEOUS

## 2017-09-01 MED ORDER — GABAPENTIN 100 MG PO CAPS
200.0000 mg | ORAL_CAPSULE | Freq: Three times a day (TID) | ORAL | Status: DC
  Administered 2017-09-01 – 2017-09-10 (×27): 200 mg via ORAL
  Filled 2017-09-01 (×26): qty 2

## 2017-09-01 MED ORDER — TRAVASOL 10 % IV SOLN
INTRAVENOUS | Status: AC
  Administered 2017-09-01: 22:00:00 via INTRAVENOUS

## 2017-09-01 MED ORDER — SODIUM CHLORIDE 0.9 % IV BOLUS
1000.0000 mL | Freq: Once | INTRAVENOUS | Status: AC
  Administered 2017-09-01: 1000 mL via INTRAVENOUS

## 2017-09-01 MED ORDER — POTASSIUM CHLORIDE 10 MEQ/100ML IV SOLN: 10.0000 meq | INTRAVENOUS | Status: DC

## 2017-09-01 MED ORDER — POTASSIUM CHLORIDE IN NACL 20-0.9 MEQ/L-% IV SOLN
INTRAVENOUS | Status: DC
  Administered 2017-09-01: 22:00:00 via INTRAVENOUS
  Filled 2017-09-01: qty 1000

## 2017-09-01 MED ORDER — HYDROMORPHONE HCL 1 MG/ML IJ SOLN
1.0000 mg | Freq: Once | INTRAMUSCULAR | Status: AC
  Administered 2017-09-01: 1 mg via INTRAVENOUS
  Filled 2017-09-01: qty 1

## 2017-09-01 NOTE — Progress Notes (Signed)
[facility]  Active patient with [facility] receiving home TPN.  Report from [facility] Pt had temp of 100.3 BP  Reclined BP  77 /42    Standing 52/40  Heart rate 138. See [facility] Home TPN formula below:    If patient discharges after hours, please call [phone].   [name] 09/01/2017, 3:25 PM

## 2017-09-01 NOTE — ED Notes (Signed)
ED Provider at bedside. 

## 2017-09-01 NOTE — ED Provider Notes (Signed)
[facility] DEPT Provider Note   CSN: [id number] Arrival date & time: 09/01/17  1447     History   Chief Complaint Chief Complaint  Patient presents with  . Hypotension  . Ca Pt    HPI Kahne Helfand. is a 42 y.o. male with history of rectal adenocarcinoma status post resection, protein calorie malnutrition presents for evaluation of progressively worsening nausea, vomiting, and abdominal pain.  He had a complicated post-op course after his bowel resection secondary to development of high output ileostomy and small bowel obstruction.  He was recently seen early yesterday morning in the ED for evaluation of worsening abdominal pain and vomiting after discharge from the hospital 2 days ago.  He underwent CT abdomen pelvis which showed high-grade bowel obstruction.  The patient was evaluated by Dr. gross with general surgery followed by Dr. Marcello Moores who performed his surgery.  Dr. Marcello Moores notes that there is no significant change in the patient's scans.  She recommended the patient remain n.p.o. except for ice chips and small sips of fluid with medications.  He states that since discharge he has had 2 episodes of emesis.  Most recently he noted gross hematemesis.  He states that he has not had anything to eat and has had only small sips of fluid with his medications.  After vomiting he states that he had an episode of chills.  His home health aide took his temperature which was 100.3 F.  He had a heart rate of 138 bpm and blood pressure of 77/42 sitting and 52/40 standing.  He did feel lightheaded and feels generally weak.  He notes ongoing nausea and sharp generalized abdominal pain which worsens after vomiting.  He denies any melanotic stools.  He notes normal stool output.  He notes difficulty urinating but states that this has been chronic since his surgery.  The history is provided by the patient.    Past Medical History:  Diagnosis Date  . Headache   .  Rectal cancer ([place]) 2018   radiation and Chemo    Patient Active Problem List   Diagnosis Date Noted  . Dehydration 09/01/2017  . SBO (small bowel obstruction) ([place]) 08/31/2017  . Ileostomy in place [facility]) 07/30/2017  . Ileus, postoperative ([place]) 07/30/2017  . Hypokalemia 07/30/2017  . Protein-calorie malnutrition, severe 07/29/2017  . Genetic testing 04/17/2017  . Port-A-Cath in place 04/17/2017  . Family history of colon cancer 03/29/2017  . Family history of melanoma 03/29/2017  . Family history of pancreatic cancer 03/29/2017  . Family history of testicular cancer 03/29/2017  . 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 COLONOSCOPY    . LAPAROSCOPIC DIVERTED COLOSTOMY N/A 07/20/2017   Procedure: DIVERTING LOOP COLOSTOMY;  Surgeon: Leighton Ruff, MD;  Location: [facility] ORS;  Service: General;  Laterality: N/A;  . PORTA CATH INSERTION    . WISDOM TOOTH EXTRACTION    . XI ROBOTIC ASSISTED LOWER ANTERIOR RESECTION N/A 07/20/2017   Procedure: XI ROBOTIC ASSISTED LOWER ANTERIOR RESECTION ERAS PATHWAY;  Surgeon: Leighton Ruff, MD;  Location: [facility] ORS;  Service: General;  Laterality: N/A;        Home Medications    Prior to Admission medications   Medication Sig Start Date End Date Taking? Authorizing Provider  gabapentin (NEURONTIN) 100 MG capsule Take 2 capsules (200 mg total) by mouth 3 (three) times daily. 1/82/99  Yes Leighton Ruff, MD  methocarbamol (ROBAXIN) 500 MG  tablet Take 2 tablets (1,000 mg total) by mouth every 6 (six) hours as needed for muscle spasms. 9/52/84  Yes Leighton Ruff, MD  ondansetron (ZOFRAN) 4 MG tablet Take 1 tablet (4 mg total) by mouth every 6 (six) hours as needed for nausea. 1/32/44  Yes Leighton Ruff, MD  oxyCODONE (OXY IR/ROXICODONE) 5 MG immediate release tablet Take 1-2 tablets (5-10 mg total) by mouth every 4 (four) hours as needed for moderate pain, severe pain or  breakthrough pain. 0/10/27  Yes Leighton Ruff, MD  sodium chloride 0.9 % Inject 1,000 mLs into the vein 2 (two) times daily. 2/53/66  Yes Leighton Ruff, MD  ibuprofen (ADVIL,MOTRIN) 200 MG tablet Take 400 mg by mouth as needed for moderate pain.     [provider]    Family History Family History  Problem Relation Age of Onset  . Diabetes Other   . Diabetes Maternal Grandmother   . Colon cancer Paternal Grandfather   . Pancreatic cancer Paternal Grandfather 74  . Breast cancer Paternal Aunt 72  . Melanoma Sister 12       x2  . Testicular cancer Maternal Grandfather 34  . COPD Paternal Grandmother   . Colon cancer Paternal Uncle 1  . Pancreatic cancer Paternal Uncle   . Testicular cancer Other   . Heart Problems Son 5    Social History Social History   Tobacco Use  . Smoking status: Current Some Day Smoker  . Smokeless tobacco: Never Used  Substance Use Topics  . Alcohol use: Yes    Comment: occ  . Drug use: Yes    Types: Marijuana    Comment: daily-last time was a week ago from 07/18/2017     Allergies   Patient has no known allergies.   Review of Systems Review of Systems  Constitutional: Positive for chills, fatigue and fever.  Respiratory: Negative for shortness of breath.   Cardiovascular: Negative for chest pain.  Gastrointestinal: Positive for abdominal pain, nausea and vomiting. Negative for blood in stool.  Genitourinary: Positive for dysuria.  Neurological: Positive for light-headedness.  All other systems reviewed and are negative.    Physical Exam Updated Vital Signs BP 100/66 (BP Location: Left Arm)   Pulse (!) 103   Temp 100 F (37.8 C) (Oral)   Resp 15   Wt 71.7 kg (158 lb 1.1 oz)   SpO2 100%   BMI 20.29 kg/m   Physical Exam  Constitutional: He appears well-developed and well-nourished. No distress.  Resting comfortably in bed  HENT:  Head: Normocephalic and atraumatic.  Eyes: Conjunctivae are normal. Right eye exhibits  no discharge. Left eye exhibits no discharge.  Neck: Normal range of motion. Neck supple. No JVD present. No tracheal deviation present.  Cardiovascular: Regular rhythm and normal heart sounds.  Mildly tachycardic  Pulmonary/Chest: Effort normal and breath sounds normal.  Abdominal: Soft. He exhibits no distension. There is tenderness. There is no guarding.  Ostomy to the right side of the abdomen.  He has midline abdominal tenderness to palpation.  Mild left CVA tenderness noted.  Hypoactive bowel sounds.  Well-healed surgical incisions.  Musculoskeletal: He exhibits no edema.  Neurological: He is alert.  Skin: Skin is warm and dry. No erythema.  Psychiatric: He has a normal mood and affect. His behavior is normal.  Nursing note and vitals reviewed.    ED Treatments / Results  Labs (all labs ordered are listed, but only abnormal results are displayed) Labs Reviewed  CBC WITH DIFFERENTIAL/PLATELET -  Abnormal; Notable for the following components:      Result Value   WBC 13.9 (*)    RBC 3.27 (*)    Hemoglobin 9.5 (*)    HCT 29.1 (*)    Neutro Abs 13.0 (*)    Lymphs Abs 0.3 (*)    All other components within normal limits  COMPREHENSIVE METABOLIC PANEL - Abnormal; Notable for the following components:   Potassium 3.4 (*)    Chloride 112 (*)    CO2 21 (*)    Glucose, Bld 160 (*)    Calcium 8.2 (*)    Total Protein 5.7 (*)    Albumin 2.5 (*)    AST 51 (*)    ALT 98 (*)    Alkaline Phosphatase 134 (*)    All other components within normal limits  MAGNESIUM - Abnormal; Notable for the following components:   Magnesium 1.3 (*)    All other components within normal limits  GLUCOSE, CAPILLARY - Abnormal; Notable for the following components:   Glucose-Capillary 145 (*)    All other components within normal limits  I-STAT CG4 LACTIC ACID, ED - Abnormal; Notable for the following components:   Lactic Acid, Venous 3.94 (*)    All other components within normal limits  LIPASE,  BLOOD  URINALYSIS, ROUTINE W REFLEX MICROSCOPIC  PHOSPHORUS  COMPREHENSIVE METABOLIC PANEL  PREALBUMIN  MAGNESIUM  PHOSPHORUS  TRIGLYCERIDES  CBC  DIFFERENTIAL  HIV ANTIBODY (ROUTINE TESTING)  LACTIC ACID, PLASMA    EKG None  Radiology Ct Abdomen Pelvis W Contrast  Result Date: 08/31/2017 CLINICAL DATA:  Acute onset of generalized abdominal pain, nausea and vomiting. EXAM: CT ABDOMEN AND PELVIS WITH CONTRAST TECHNIQUE: Multidetector CT imaging of the abdomen and pelvis was performed using the standard protocol following bolus administration of intravenous contrast. CONTRAST:  151mL ISOVUE-300 IOPAMIDOL (ISOVUE-300) INJECTION 61% COMPARISON:  None. FINDINGS: Lower chest: Minimal bibasilar atelectasis is noted. Scattered coronary artery calcification is noted. Wall thickening at the distal esophagus may reflect chronic inflammation or esophagitis. Hepatobiliary: A 1.5 cm hypodensity is noted at the hepatic dome. The liver is otherwise unremarkable. The gallbladder is not well assessed given surrounding ascites. The common bile duct remains normal in caliber. Pancreas: The pancreas is within normal limits. Spleen: The spleen is unremarkable in appearance. Adrenals/Urinary Tract: The adrenal glands are unremarkable in appearance. The kidneys are within normal limits. There is no evidence of hydronephrosis. No renal or ureteral stones are identified. No perinephric stranding is seen. Stomach/Bowel: The patient is status post diverting loop ileostomy at the terminal ileum. There is diffuse distention of small-bowel loops to 5.6 cm in maximal diameter, with multiple transition points at the pelvis due to extensive scarring and adhesions. Findings are compatible with relatively high-grade small bowel obstruction. The colon is decompressed. Postoperative change is noted at the level of the rectum. Trace fluid about the appendix likely tracks from the small bowel process. The appendix is grossly  unremarkable in appearance. The stomach is partially filled with fluid and grossly unremarkable. Vascular/Lymphatic: The abdominal aorta is unremarkable in appearance. The inferior vena cava is grossly unremarkable. Mildly prominent retroperitoneal nodes measure up to 1.0 cm in short axis. No pelvic sidewall lymphadenopathy is identified. Reproductive: The prostate is not well characterized. The bladder is mildly distended and grossly unremarkable. Other: No additional soft tissue abnormalities are seen. Musculoskeletal: No acute osseous abnormalities are identified. The visualized musculature is unremarkable in appearance. IMPRESSION: 1. Diffuse distention of small-bowel loops to 5.6 cm in  maximal diameter, with multiple transition points at the pelvis due to extensive scarring and adhesions. Findings compatible with relatively high-grade small bowel obstruction. 2. Wall thickening at the distal esophagus may reflect chronic inflammation or esophagitis. 3. Diverting loop ileostomy at the terminal ileum is unremarkable in appearance. The colon is decompressed. 4. Scattered coronary artery calcifications seen. 5. 1.5 cm nonspecific hypodensity at the hepatic dome. These results were called by telephone at the time of interpretation on 08/31/2017 at 5:31 am to [facility] PA, who verbally acknowledged these results. Electronically Signed   By: Garald Balding M.D.   On: 08/31/2017 05:33    Procedures Procedures (including critical care time)  Medications Ordered in ED Medications  HYDROmorphone (DILAUDID) injection 0.25 mg (0 mg Intravenous Hold 09/01/17 1856)  gabapentin (NEURONTIN) capsule 200 mg (200 mg Oral Given 09/01/17 2306)  methocarbamol (ROBAXIN) tablet 1,000 mg (has no administration in time range)  oxyCODONE (Oxy IR/ROXICODONE) immediate release tablet 5-10 mg (10 mg Oral Given 09/01/17 2136)  enoxaparin (LOVENOX) injection 40 mg (40 mg Subcutaneous Given 09/01/17 2306)  promethazine (PHENERGAN)  injection 12.5 mg (has no administration in time range)  TPN CYCLIC-ADULT (ION) ( Intravenous New Bag/Given 09/01/17 2158)  insulin aspart (novoLOG) injection 0-9 Units (has no administration in time range)  0.9 % NaCl with KCl 20 mEq/ L  infusion (has no administration in time range)  sodium chloride flush (NS) 0.9 % injection 10-40 mL (has no administration in time range)  HYDROmorphone (DILAUDID) injection 1 mg (1 mg Intravenous Given 09/01/17 2311)  sodium chloride 0.9 % bolus 1,000 mL (0 mLs Intravenous Stopped 09/01/17 1643)  sodium chloride 0.9 % bolus 1,000 mL (1,000 mLs Intravenous New Bag/Given 09/01/17 1856)  promethazine (PHENERGAN) injection 12.5 mg (12.5 mg Intravenous Given 09/01/17 1855)  HYDROmorphone (DILAUDID) injection 1 mg (1 mg Intravenous Given 09/01/17 1855)  magnesium sulfate IVPB 4 g 100 mL (4 g Intravenous New Bag/Given 09/01/17 2141)     Initial Impression / Assessment and Plan / ED Course  I have reviewed the triage vital signs and the nursing notes.  Pertinent labs & imaging results that were available during my care of the patient were reviewed by me and considered in my medical decision making (see chart for details).     Patient presents for evaluation of worsening nausea, vomiting, and abdominal pain.  He was seen and evaluated yesterday and was found to be stable for discharge home after evaluation by his general surgeon Dr. Marcello Moores.  He states that since then he has felt weaker and has had hematemesis.  He was hypotensive with his caregiver with a low-grade fever of 100.3 F.  In the ED he is tachycardic and mildly hypotensive, near his baseline.  His has improved after fluids.  Lab work reviewed by me shows elevated lactate of 3.94 which is elevated from his lactate yesterday.  He also has a worsening leukocytosis of 13.9 as compared to 10.2 yesterday.  The patient is well-appearing and I do not believe him to be septic at this time but do not feel that he is stable  for discharge home presently.  CT scan yesterday showed known high-grade small bowel obstruction.  5:35 PM Spoke with Dr. Ninfa Linden with general surgery who will see and evaluate the patient. Patient was also seen and evaluated by Dr. Dayna Barker who agrees with assessment and plan at this time.   Final Clinical Impressions(s) / ED Diagnoses   Final diagnoses:  Small bowel obstruction ([place])  ED Discharge Orders    None       Debroah Baller 09/01/17 2345    Mesner, Corene Cornea, MD 09/02/17 [phone]

## 2017-09-01 NOTE — ED Provider Notes (Signed)
Medical screening examination/treatment/procedure(s) were conducted as a shared visit with non-physician practitioner(s) and myself.  I personally evaluated the patient during the encounter.  42 year old male who just got on the hospital few days ago after a long complicated course status post diverting loop ileostomy secondary to low rectal cancer.  Is here today for hypotension found while getting orthostatics done by home health nurse.  He was just discharged yesterday morning after CT scan showed possible bowel obstruction but was cleared with surgery after this arm in the emergency room.  Patient said he had a couple episodes of blood-streaked vomit this morning but does feel little bit better now than he has last couple days.  Has had copious amounts of liquid output in his ostomy bag but not much for solids. Exam is tachycardic, slightly dry with sunken eyes and skin tenting and dry mucous membranes.  His blood pressures now are improved over what is shown from home when he had a systolics in the 78M that was dropped to 50s when standing. Plan for labs and reconsulting general surgery to see if there is any indication for repeat CT scan or admission for his dehydration and hypotension.  Had a temperature of 100.3 at home we will check a rectal temperature here.  None     Zipporah Finamore, Corene Cornea, MD 09/02/17 [phone]

## 2017-09-01 NOTE — Progress Notes (Signed)
PHARMACY - ADULT TOTAL PARENTERAL NUTRITION CONSULT NOTE   Pharmacy Consult for TPN Indication: prolonged ileus Patient Measurements:    There is no height or weight on file to calculate BMI. Usual Weight:  Insulin Requirements: none PTA  Current Nutrition: none  IVF: NS +55mEq K @ 128ml/hr after 1L NS bolus x2  Central access: 12/14/16 TPN start date: 07/28/17  ASSESSMENT                                                                                                          HPI: 42 yo M with rectal cancer s/p robotic assisted low anterior resection with diverting loop ileostomy on 07/20/17. Post-op course has been complicated by small bowel obstructions & high output from ileostomy.  He is currently on home TPN.  He presents today with N/V/abdominal pain/dehydration.  He is re-admitted for hydration.   Significant events:   Currently on TPN per [place]- see Pam Chandler's note 5/17 for formula  Today:   Glucose - 160, slightly above goal 150  Electrolytes -  K+3.4 (goal >4), Mag 1.3 (goal>2), Phos, Na CorrCa at goal  Renal - stable, Scr 0.99  LFTs - WNL  TGs - ordered for 5/18  Prealbumin - ordered for 5/18  NUTRITIONAL GOALS                                                                                             RD recs (from 5/6): Kcal:2000-2200; Protein:90-100 grams/day Clinimix E 5/20 at a goal rate of 2000 mL/24 hrs + 20% fat emulsion at 20 ml/hr x 12 hr to provide: 100 g/day protein, 2233 Kcal/day.  Inpatient formula from most recent admission (discharged 08/29/17) Clinimix E 5/20, 2000 mL cycled over 12 hours:  50 ml/hr during first hour, followed by 190 ml/hr for 10 hours then 50 ml/hr for final hour ml/hr.   Add NaCl to TPN to make TPN equivalent to NS conc (154 mEq/L)  Continue 20% fat emulsion at 20 ml/hr for 12 hrs  TPN to contain standard multivitamins and trace elements  PLAN  Now:   Give Magnesium 4gm IV x1  Continue home TPN today.  Change to hospital formula tomorrow if patient to remain hospitalized.   Add sensitive SSI .   TPN lab panels on Mondays & Thursdays. Check initial labs in AM.  IVF: Hold IVF while TPN infusing then resume at 13ml/hr while TPN off as discussed with Dr Ninfa Linden.  F/u daily.  Biagio Borg 09/01/2017,6:49 PM

## 2017-09-01 NOTE — H&P (Signed)
Jacob Galvan. is an 42 y.o. male.   Chief Complaint: abdominal pain, N/V HPI: This is a 42 year old gentleman who is status post a robotic assisted low anterior resection with diverting loop ileostomy on April 4 for rectal cancer.  He has had a difficult postoperative course secondary to small bowel obstructions.  He is also had high output from his ostomy.  He is recently been discharged home on TNA.  He returns with intermittent nausea, vomiting and abdominal pain as well as dehydration.  The pain is been moderate to severe in intensity but is not new.  He had just been seen yesterday and a CT scan demonstrated small bowel dilation.  He was evaluated in the office by Dr. Marcello Moores yesterday as well.  Since being in the emergency department today, he was found to have dehydration with an elevated lactic acid level.  He has been receiving IV fluid and actually now feels better.  Past Medical History:  Diagnosis Date  . Headache   . Rectal cancer ([place]) 2018   radiation and Chemo    Past Surgical History:  Procedure Laterality Date  . COLONOSCOPY    . LAPAROSCOPIC DIVERTED COLOSTOMY N/A 07/20/2017   Procedure: DIVERTING LOOP COLOSTOMY;  Surgeon: Leighton Ruff, MD;  Location: [facility];  Service: General;  Laterality: N/A;  . PORTA CATH INSERTION    . WISDOM TOOTH EXTRACTION    . XI ROBOTIC ASSISTED LOWER ANTERIOR RESECTION N/A 07/20/2017   Procedure: XI ROBOTIC ASSISTED LOWER ANTERIOR RESECTION ERAS PATHWAY;  Surgeon: Leighton Ruff, MD;  Location: [facility];  Service: General;  Laterality: N/A;    Family History  Problem Relation Age of Onset  . Diabetes Other   . Diabetes Maternal Grandmother   . Colon cancer Paternal Grandfather   . Pancreatic cancer Paternal Grandfather 29  . Breast cancer Paternal Aunt 52  . Melanoma Sister 18       x2  . Testicular cancer Maternal Grandfather 68  . COPD Paternal Grandmother   . Colon cancer Paternal Uncle 65  . Pancreatic cancer Paternal Uncle   .  Testicular cancer Other   . Heart Problems Son 5   Social History:  reports that he has been smoking.  He has never used smokeless tobacco. He reports that he drinks alcohol. He reports that he has current or past drug history. Drug: Marijuana.  Allergies: No Known Allergies   (Not in a hospital admission)  Results for orders placed or performed during the hospital encounter of 09/01/17 (from the past 48 hour(s))  CBC with Differential     Status: Abnormal   Collection Time: 09/01/17  4:14 PM  Result Value Ref Range   WBC 13.9 (H) 4.0 - 10.5 K/uL   RBC 3.27 (L) 4.22 - 5.81 MIL/uL   Hemoglobin 9.5 (L) 13.0 - 17.0 g/dL   HCT 29.1 (L) 39.0 - 52.0 %   MCV 89.0 78.0 - 100.0 fL   MCH 29.1 26.0 - 34.0 pg   MCHC 32.6 30.0 - 36.0 g/dL   RDW 14.3 11.5 - 15.5 %   Platelets 242 150 - 400 K/uL   Neutrophils Relative % 94 %   Neutro Abs 13.0 (H) 1.7 - 7.7 K/uL   Lymphocytes Relative 2 %   Lymphs Abs 0.3 (L) 0.7 - 4.0 K/uL   Monocytes Relative 4 %   Monocytes Absolute 0.6 0.1 - 1.0 K/uL   Eosinophils Relative 0 %   Eosinophils Absolute 0.0 0.0 - 0.7 K/uL  Basophils Relative 0 %   Basophils Absolute 0.0 0.0 - 0.1 K/uL    Comment: Performed at [facility], [place] [address] [place], [place] [address]  Comprehensive metabolic panel     Status: Abnormal   Collection Time: 09/01/17  4:14 PM  Result Value Ref Range   Sodium 145 135 - 145 mmol/L   Potassium 3.4 (L) 3.5 - 5.1 mmol/L    Comment: DELTA CHECK NOTED   Chloride 112 (H) 101 - 111 mmol/L   CO2 21 (L) 22 - 32 mmol/L   Glucose, Bld 160 (H) 65 - 99 mg/dL   BUN 15 6 - 20 mg/dL   Creatinine, Ser 0.99 0.61 - 1.24 mg/dL   Calcium 8.2 (L) 8.9 - 10.3 mg/dL   Total Protein 5.7 (L) 6.5 - 8.1 g/dL   Albumin 2.5 (L) 3.5 - 5.0 g/dL   AST 51 (H) 15 - 41 U/L   ALT 98 (H) 17 - 63 U/L   Alkaline Phosphatase 134 (H) 38 - 126 U/L   Total Bilirubin 0.7 0.3 - 1.2 mg/dL   GFR calc non Af Amer >60 >60 mL/min   GFR calc Af Amer >60  >60 mL/min    Comment: (NOTE) The eGFR has been calculated using the CKD EPI equation. This calculation has not been validated in all clinical situations. eGFR's persistently <60 mL/min signify possible Chronic Kidney Disease.    Anion gap 12 5 - 15    Comment: Performed at [facility], [place] [address], [place], [place] [address]  Lipase, blood     Status: None   Collection Time: 09/01/17  4:14 PM  Result Value Ref Range   Lipase 38 11 - 51 U/L    Comment: Performed at [facility], [place] [address], [place], [place] [address]  I-Stat CG4 Lactic Acid, ED     Status: Abnormal   Collection Time: 09/01/17  4:22 PM  Result Value Ref Range   Lactic Acid, Venous 3.94 (HH) 0.5 - 1.9 mmol/L   Comment NOTIFIED PHYSICIAN   Urinalysis, Routine w reflex microscopic     Status: None   Collection Time: 09/01/17  5:29 PM  Result Value Ref Range   Color, Urine YELLOW YELLOW   APPearance CLEAR CLEAR   Specific Gravity, Urine 1.014 1.005 - 1.030   pH 5.0 5.0 - 8.0   Glucose, UA NEGATIVE NEGATIVE mg/dL   Hgb urine dipstick NEGATIVE NEGATIVE   Bilirubin Urine NEGATIVE NEGATIVE   Ketones, ur NEGATIVE NEGATIVE mg/dL   Protein, ur NEGATIVE NEGATIVE mg/dL   Nitrite NEGATIVE NEGATIVE   Leukocytes, UA NEGATIVE NEGATIVE    Comment: Performed at [facility] [address], [place], [place] [address]   Ct Abdomen Pelvis W Contrast  Result Date: 08/31/2017 CLINICAL DATA:  Acute onset of generalized abdominal pain, nausea and vomiting. EXAM: CT ABDOMEN AND PELVIS WITH CONTRAST TECHNIQUE: Multidetector CT imaging of the abdomen and pelvis was performed using the standard protocol following bolus administration of intravenous contrast. CONTRAST:  141m ISOVUE-300 IOPAMIDOL (ISOVUE-300) INJECTION 61% COMPARISON:  None. FINDINGS: Lower chest: Minimal bibasilar atelectasis is noted. Scattered coronary artery calcification is noted. Wall thickening at the  distal esophagus may reflect chronic inflammation or esophagitis. Hepatobiliary: A 1.5 cm hypodensity is noted at the hepatic dome. The liver is otherwise unremarkable. The gallbladder is not well assessed given surrounding ascites. The common bile duct remains normal in caliber. Pancreas: The pancreas is within normal limits. Spleen:  The spleen is unremarkable in appearance. Adrenals/Urinary Tract: The adrenal glands are unremarkable in appearance. The kidneys are within normal limits. There is no evidence of hydronephrosis. No renal or ureteral stones are identified. No perinephric stranding is seen. Stomach/Bowel: The patient is status post diverting loop ileostomy at the terminal ileum. There is diffuse distention of small-bowel loops to 5.6 cm in maximal diameter, with multiple transition points at the pelvis due to extensive scarring and adhesions. Findings are compatible with relatively high-grade small bowel obstruction. The colon is decompressed. Postoperative change is noted at the level of the rectum. Trace fluid about the appendix likely tracks from the small bowel process. The appendix is grossly unremarkable in appearance. The stomach is partially filled with fluid and grossly unremarkable. Vascular/Lymphatic: The abdominal aorta is unremarkable in appearance. The inferior vena cava is grossly unremarkable. Mildly prominent retroperitoneal nodes measure up to 1.0 cm in short axis. No pelvic sidewall lymphadenopathy is identified. Reproductive: The prostate is not well characterized. The bladder is mildly distended and grossly unremarkable. Other: No additional soft tissue abnormalities are seen. Musculoskeletal: No acute osseous abnormalities are identified. The visualized musculature is unremarkable in appearance. IMPRESSION: 1. Diffuse distention of small-bowel loops to 5.6 cm in maximal diameter, with multiple transition points at the pelvis due to extensive scarring and adhesions. Findings  compatible with relatively high-grade small bowel obstruction. 2. Wall thickening at the distal esophagus may reflect chronic inflammation or esophagitis. 3. Diverting loop ileostomy at the terminal ileum is unremarkable in appearance. The colon is decompressed. 4. Scattered coronary artery calcifications seen. 5. 1.5 cm nonspecific hypodensity at the hepatic dome. These results were called by telephone at the time of interpretation on 08/31/2017 at 5:31 am to [facility], who verbally acknowledged these results. Electronically Signed   By: Garald Balding M.D.   On: 08/31/2017 05:33    Review of Systems  All other systems reviewed and are negative.   Blood pressure 93/61, pulse 60, temperature 100 F (37.8 C), temperature source Rectal, resp. rate 20, SpO2 96 %. Physical Exam  Constitutional: He is oriented to person, place, and time. He appears well-developed and well-nourished. No distress.  HENT:  Head: Normocephalic and atraumatic.  Eyes: Pupils are equal, round, and reactive to light. No scleral icterus.  Neck: Normal range of motion. No tracheal deviation present.  Cardiovascular: Normal rate, regular rhythm and normal heart sounds.  Respiratory: Effort normal and breath sounds normal. No respiratory distress. He has no wheezes.  GI: Soft. There is tenderness. There is no rebound and no guarding.  His abdomen is soft and minimally tender.  He does not have an acute abdomen.  The ostomy is working well and is viable.  Musculoskeletal: Normal range of motion. He exhibits no edema.  Neurological: He is alert and oriented to person, place, and time.  Skin: Skin is warm. No rash noted. He is not diaphoretic. No erythema.  Psychiatric: His behavior is normal. Judgment normal.     Assessment/Plan Dehydration  Given the extent of his dehydration and elevated lactic acid level, I will admitted to the hospital for IV rehydration.  We will continue his current TNA.  Again, we are trying  to avoid any surgery on the partial bowel obstruction.  I will repeat his laboratory data in several hours and check a KUB as well.  I discussed this with the patient and his family and they are in agreement with plan.  Harl Bowie, MD 09/01/2017, 6:32 PM

## 2017-09-01 NOTE — ED Triage Notes (Signed)
Pt presents from home, states her home health nurse recommended he come for evaluation for hypotension. He reports that her BP at home was 77/42 sitting and 52/40 standing with a HR of 138, T 100.3 and O2 Sat 98% on RA. Also reports that he had been vomiting/dry heaving and had an near syncope episode.

## 2017-09-01 NOTE — ED Notes (Signed)
MD AND RN NOTIFIED OF PATIENT'S LACTIC ACID LEVEL OF 3.94

## 2017-09-02 ENCOUNTER — Observation Stay (HOSPITAL_COMMUNITY): Payer: Medicaid Other

## 2017-09-02 DIAGNOSIS — Z85048 Personal history of other malignant neoplasm of rectum, rectosigmoid junction, and anus: Secondary | ICD-10-CM | POA: Diagnosis not present

## 2017-09-02 DIAGNOSIS — Z808 Family history of malignant neoplasm of other organs or systems: Secondary | ICD-10-CM | POA: Diagnosis not present

## 2017-09-02 DIAGNOSIS — I959 Hypotension, unspecified: Secondary | ICD-10-CM | POA: Diagnosis present

## 2017-09-02 DIAGNOSIS — K56609 Unspecified intestinal obstruction, unspecified as to partial versus complete obstruction: Secondary | ICD-10-CM | POA: Diagnosis present

## 2017-09-02 DIAGNOSIS — Z933 Colostomy status: Secondary | ICD-10-CM | POA: Diagnosis not present

## 2017-09-02 DIAGNOSIS — E860 Dehydration: Secondary | ICD-10-CM | POA: Diagnosis present

## 2017-09-02 DIAGNOSIS — E46 Unspecified protein-calorie malnutrition: Secondary | ICD-10-CM | POA: Diagnosis present

## 2017-09-02 DIAGNOSIS — Z8043 Family history of malignant neoplasm of testis: Secondary | ICD-10-CM | POA: Diagnosis not present

## 2017-09-02 DIAGNOSIS — Z833 Family history of diabetes mellitus: Secondary | ICD-10-CM | POA: Diagnosis not present

## 2017-09-02 DIAGNOSIS — Z803 Family history of malignant neoplasm of breast: Secondary | ICD-10-CM | POA: Diagnosis not present

## 2017-09-02 DIAGNOSIS — T80219A Unspecified infection due to central venous catheter, initial encounter: Secondary | ICD-10-CM | POA: Diagnosis not present

## 2017-09-02 DIAGNOSIS — Z800 Family history of malignant neoplasm of digestive organs: Secondary | ICD-10-CM | POA: Diagnosis not present

## 2017-09-02 DIAGNOSIS — Z825 Family history of asthma and other chronic lower respiratory diseases: Secondary | ICD-10-CM | POA: Diagnosis not present

## 2017-09-02 DIAGNOSIS — Z6820 Body mass index (BMI) 20.0-20.9, adult: Secondary | ICD-10-CM | POA: Diagnosis not present

## 2017-09-02 DIAGNOSIS — F17200 Nicotine dependence, unspecified, uncomplicated: Secondary | ICD-10-CM | POA: Diagnosis present

## 2017-09-02 DIAGNOSIS — Y848 Other medical procedures as the cause of abnormal reaction of the patient, or of later complication, without mention of misadventure at the time of the procedure: Secondary | ICD-10-CM | POA: Diagnosis not present

## 2017-09-02 LAB — CBC
HEMATOCRIT: 26.4 % — AB (ref 39.0–52.0)
Hemoglobin: 8.8 g/dL — ABNORMAL LOW (ref 13.0–17.0)
MCH: 29.3 pg (ref 26.0–34.0)
MCHC: 33.3 g/dL (ref 30.0–36.0)
MCV: 88 fL (ref 78.0–100.0)
Platelets: 201 10*3/uL (ref 150–400)
RBC: 3 MIL/uL — AB (ref 4.22–5.81)
RDW: 14.4 % (ref 11.5–15.5)
WBC: 10.3 10*3/uL (ref 4.0–10.5)

## 2017-09-02 LAB — DIFFERENTIAL
BASOS ABS: 0 10*3/uL (ref 0.0–0.1)
Basophils Relative: 0 %
EOS ABS: 0 10*3/uL (ref 0.0–0.7)
Eosinophils Relative: 0 %
LYMPHS ABS: 0.6 10*3/uL — AB (ref 0.7–4.0)
LYMPHS PCT: 6 %
MONOS PCT: 4 %
Monocytes Absolute: 0.4 10*3/uL (ref 0.1–1.0)
NEUTROS ABS: 9.3 10*3/uL — AB (ref 1.7–7.7)
Neutrophils Relative %: 90 %

## 2017-09-02 LAB — MAGNESIUM: Magnesium: 2.1 mg/dL (ref 1.7–2.4)

## 2017-09-02 LAB — GLUCOSE, CAPILLARY
GLUCOSE-CAPILLARY: 115 mg/dL — AB (ref 65–99)
GLUCOSE-CAPILLARY: 109 mg/dL — AB (ref 65–99)
Glucose-Capillary: 126 mg/dL — ABNORMAL HIGH (ref 65–99)

## 2017-09-02 LAB — PHOSPHORUS: Phosphorus: 2.7 mg/dL (ref 2.5–4.6)

## 2017-09-02 LAB — COMPREHENSIVE METABOLIC PANEL
ALBUMIN: 2.2 g/dL — AB (ref 3.5–5.0)
ALT: 81 U/L — AB (ref 17–63)
AST: 31 U/L (ref 15–41)
Alkaline Phosphatase: 123 U/L (ref 38–126)
Anion gap: 9 (ref 5–15)
BILIRUBIN TOTAL: 0.5 mg/dL (ref 0.3–1.2)
BUN: 13 mg/dL (ref 6–20)
CHLORIDE: 108 mmol/L (ref 101–111)
CO2: 22 mmol/L (ref 22–32)
CREATININE: 0.81 mg/dL (ref 0.61–1.24)
Calcium: 7.6 mg/dL — ABNORMAL LOW (ref 8.9–10.3)
GFR calc Af Amer: >60 mL/min (ref >60)
Glucose, Bld: 149 mg/dL — ABNORMAL HIGH (ref 65–99)
POTASSIUM: 3.6 mmol/L (ref 3.5–5.1)
Sodium: 139 mmol/L (ref 135–145)
Total Protein: 5.3 g/dL — ABNORMAL LOW (ref 6.5–8.1)

## 2017-09-02 LAB — LACTIC ACID, PLASMA: LACTIC ACID, VENOUS: 1.8 mmol/L (ref 0.5–1.9)

## 2017-09-02 LAB — TRIGLYCERIDES: TRIGLYCERIDES: 55 mg/dL (ref <150)

## 2017-09-02 LAB — PREALBUMIN: Prealbumin: 12.6 mg/dL — ABNORMAL LOW (ref 18–38)

## 2017-09-02 MED ORDER — POTASSIUM CHLORIDE 10 MEQ/50ML IV SOLN
10.0000 meq | INTRAVENOUS | Status: AC
  Administered 2017-09-02 (×2): 10 meq via INTRAVENOUS
  Filled 2017-09-02 (×2): qty 50

## 2017-09-02 MED ORDER — TRACE MINERALS CR-CU-MN-SE-ZN 10-1000-500-60 MCG/ML IV SOLN
INTRAVENOUS | Status: AC
  Administered 2017-09-02: 18:00:00 via INTRAVENOUS
  Filled 2017-09-02 (×2): qty 2000

## 2017-09-02 MED ORDER — ACETAMINOPHEN 10 MG/ML IV SOLN
1000.0000 mg | Freq: Once | INTRAVENOUS | Status: AC
  Administered 2017-09-02: 1000 mg via INTRAVENOUS
  Filled 2017-09-02: qty 100

## 2017-09-02 MED ORDER — ONDANSETRON HCL 4 MG/2ML IJ SOLN
4.0000 mg | Freq: Four times a day (QID) | INTRAMUSCULAR | Status: DC | PRN
  Administered 2017-09-02 – 2017-09-10 (×17): 4 mg via INTRAVENOUS
  Filled 2017-09-02 (×16): qty 2

## 2017-09-02 MED ORDER — FAT EMULSION PLANT BASED 20 % IV EMUL
240.0000 mL | INTRAVENOUS | Status: AC
  Administered 2017-09-02: 240 mL via INTRAVENOUS
  Filled 2017-09-02: qty 250

## 2017-09-02 MED ORDER — ONDANSETRON HCL 4 MG/2ML IJ SOLN: 4.0000 mg | Freq: Four times a day (QID) | INTRAMUSCULAR | Status: DC | PRN

## 2017-09-02 NOTE — Progress Notes (Signed)
   Subjective/Chief Complaint: Reports still having nausea but less pain Large output from ostomy Fever this morning   Objective: Vital signs in last 24 hours: Temp:  [98.5 F (36.9 C)-103.2 F (39.6 C)] 98.5 F (36.9 C) (05/18 0606) Pulse Rate:  [60-130] 119 (05/18 0321) Resp:  [12-22] 15 (05/18 0321) BP: (73-103)/(53-66) 103/55 (05/18 0321) SpO2:  [96 %-100 %] 97 % (05/18 0321) Weight:  [71.7 kg (158 lb 1.1 oz)] 71.7 kg (158 lb 1.1 oz) (05/17 2047)    Intake/Output from previous day: 05/17 0701 - 05/18 0700 In: 1640 [P.O.:640; IV Piggyback:1000] Out: 4225 [Urine:1400; Stool:2825] Intake/Output this shift: No intake/output data recorded.  Exam: Looks comfortable Abdomen mildly distended, non-tender Ostomy with large output  Lab Results:  Recent Labs    09/01/17 1614 09/02/17 0550  WBC 13.9* 10.3  HGB 9.5* 8.8*  HCT 29.1* 26.4*  PLT 242 201   BMET Recent Labs    09/01/17 1614 09/02/17 0550  NA 145 139  K 3.4* 3.6  CL 112* 108  CO2 21* 22  GLUCOSE 160* 149*  BUN 15 13  CREATININE 0.99 0.81  CALCIUM 8.2* 7.6*   PT/INR No results for input(s): LABPROT, INR in the last 72 hours. ABG No results for input(s): PHART, HCO3 in the last 72 hours.  Invalid input(s): PCO2, PO2  Studies/Results: Dg Abd Portable 1v  Result Date: 09/02/2017 CLINICAL DATA:  Small bowel obstruction, rectal cancer. EXAM: PORTABLE ABDOMEN - 1 VIEW COMPARISON:  CT abdomen pelvis 08/31/2017 and abdominal x-ray 08/29/2017. FINDINGS: Marked gaseous distension of small bowel, measuring up to 7.2 cm in diameter. No definite colonic gas. IMPRESSION: High-grade small bowel obstruction. Electronically Signed   By: Lorin Picket M.D.   On: 09/02/2017 07:14    Anti-infectives: Anti-infectives (From admission, onward)   None      Assessment/Plan: Dehydration with post op SBO  WBC now normal and lactic acid normal after rehydration  Will hold on NG given patient request Repeat  films in the morning Cultures if temp spikes again Continue TNA   LOS: 0 days    Jacob Galvan A 09/02/2017

## 2017-09-02 NOTE — Progress Notes (Signed)
PHARMACY - ADULT TOTAL PARENTERAL NUTRITION CONSULT NOTE   Pharmacy Consult for TPN Indication: prolonged ileus Patient Measurements:Weight: 158 lb 1.1 oz (71.7 kg)   Body mass index is 20.29 kg/m. Usual Weight: Wt 09/01/17 71.7 kg  Insulin Requirements: none PTA, 2 U SSI  Current Nutrition: NPO, home TPN PTA  IVF: NS +83mEq K @ 125ml/hr after 1L NS bolus x2 and while TPN is off  Central access: 12/14/16 TPN start date: 07/28/17  ASSESSMENT                                                                                                          HPI: 42 yo M with rectal cancer s/p robotic assisted low anterior resection with diverting loop ileostomy on 07/20/17. Post-op course has been complicated by small bowel obstructions & high output from ileostomy.  He is currently on home TPN.  He presents today with N/V/abdominal pain/dehydration.  He is re-admitted for hydration.   Significant events:   Currently on TPN per [place]- see Pam Chandler's note 5/17 for formula  Today:   Glucose -< 150 on TPN, 1 U SSI given  Electrolytes -  K+3.6 (goal >4), Mag 1.3>>2.1 after 4 gm bolus  (goal>2), Phos, Na CorrCa at goal  Renal - stable, Scr 0.81  LFTs - WNL x ALT sl elevated  TGs - 55 5/18  Prealbumin - in process 5/18  KUB 5/18: high grade SBO; large output from ostomy, CCS holding off NGT  NUTRITIONAL GOALS                                                                                             RD recs (from 5/6): Kcal:2000-2200; Protein:90-100 grams/day Clinimix E 5/20 at a goal rate of 2000 mL/24 hrs + 20% fat emulsion at 20 ml/hr x 12 hr to provide: 100 g/day protein, 2233 Kcal/day.  Inpatient formula from most recent admission (discharged 08/29/17) Clinimix E 5/20, 2000 mL cycled over 12 hours:  50 ml/hr during first hour, followed by 190 ml/hr for 10 hours then 50 ml/hr for final hour ml/hr.   Add NaCl to TPN to make TPN equivalent to NS conc (154  mEq/L)  Continue 20% fat emulsion at 20 ml/hr for 12 hrs  TPN to contain standard multivitamins and trace elements  PLAN  Now:   Give K 2 runs now  At 1800:   Change to hospital formula  Clinimix E 5/20, 2000 mL cycled over 12 hours:  50 ml/hr during first hour, followed by 190 ml/hr for 10 hours then 50 ml/hr for final hour ml/hr.   Add NaCl to TPN to make TPN equivalent to NS conc (154 mEq/L)  Continue 20% fat emulsion at 20 ml/hr for 12 hrs  TPN to contain standard multivitamins and trace elements  sensitive SSI .   TPN lab panels on Mondays & Thursdays.   IVF: Hold IVF while TPN infusing then resume at 157ml/hr while TPN off as discussed with Dr Ninfa Linden.  F/u daily.   Eudelia Bunch, Pharm.D. [phone] 09/02/2017 7:23 AM

## 2017-09-03 ENCOUNTER — Inpatient Hospital Stay (HOSPITAL_COMMUNITY): Payer: Medicaid Other

## 2017-09-03 LAB — BLOOD CULTURE ID PANEL (REFLEXED)
Acinetobacter baumannii: NOT DETECTED
CANDIDA GLABRATA: NOT DETECTED
CANDIDA KRUSEI: NOT DETECTED
CARBAPENEM RESISTANCE: NOT DETECTED
Candida albicans: NOT DETECTED
Candida parapsilosis: NOT DETECTED
Candida tropicalis: NOT DETECTED
ENTEROCOCCUS SPECIES: NOT DETECTED
ESCHERICHIA COLI: NOT DETECTED
Enterobacter cloacae complex: NOT DETECTED
Enterobacteriaceae species: DETECTED — AB
Haemophilus influenzae: NOT DETECTED
KLEBSIELLA OXYTOCA: DETECTED — AB
Klebsiella pneumoniae: NOT DETECTED
Listeria monocytogenes: NOT DETECTED
Neisseria meningitidis: NOT DETECTED
PROTEUS SPECIES: NOT DETECTED
Pseudomonas aeruginosa: NOT DETECTED
SERRATIA MARCESCENS: NOT DETECTED
STAPHYLOCOCCUS AUREUS BCID: NOT DETECTED
STAPHYLOCOCCUS SPECIES: NOT DETECTED
STREPTOCOCCUS PNEUMONIAE: NOT DETECTED
Streptococcus agalactiae: NOT DETECTED
Streptococcus pyogenes: NOT DETECTED
Streptococcus species: NOT DETECTED

## 2017-09-03 LAB — CBC
HCT: 27 % — ABNORMAL LOW (ref 39.0–52.0)
Hemoglobin: 9 g/dL — ABNORMAL LOW (ref 13.0–17.0)
MCH: 29.3 pg (ref 26.0–34.0)
MCHC: 33.3 g/dL (ref 30.0–36.0)
MCV: 87.9 fL (ref 78.0–100.0)
PLATELETS: 192 10*3/uL (ref 150–400)
RBC: 3.07 MIL/uL — AB (ref 4.22–5.81)
RDW: 14.3 % (ref 11.5–15.5)
WBC: 8.4 10*3/uL (ref 4.0–10.5)

## 2017-09-03 LAB — HIV ANTIBODY (ROUTINE TESTING W REFLEX): HIV Screen 4th Generation wRfx: NONREACTIVE

## 2017-09-03 LAB — GLUCOSE, CAPILLARY
GLUCOSE-CAPILLARY: 150 mg/dL — AB (ref 65–99)
Glucose-Capillary: 98 mg/dL (ref 65–99)

## 2017-09-03 MED ORDER — PIPERACILLIN-TAZOBACTAM 3.375 G IVPB
3.3750 g | Freq: Three times a day (TID) | INTRAVENOUS | Status: DC
  Administered 2017-09-03 – 2017-09-06 (×9): 3.375 g via INTRAVENOUS
  Filled 2017-09-03 (×13): qty 50

## 2017-09-03 MED ORDER — FAT EMULSION PLANT BASED 20 % IV EMUL
240.0000 mL | INTRAVENOUS | Status: AC
  Administered 2017-09-03: 240 mL via INTRAVENOUS
  Filled 2017-09-03: qty 250

## 2017-09-03 MED ORDER — ACETAMINOPHEN 325 MG PO TABS
650.0000 mg | ORAL_TABLET | ORAL | Status: DC | PRN
Start: 2017-09-03 — End: 2017-09-10
  Administered 2017-09-03 – 2017-09-10 (×9): 650 mg via ORAL
  Administered 2017-09-10 (×2): 325 mg via ORAL
  Administered 2017-09-10: 650 mg via ORAL
  Filled 2017-09-03 (×12): qty 2

## 2017-09-03 MED ORDER — TRACE MINERALS CR-CU-MN-SE-ZN 10-1000-500-60 MCG/ML IV SOLN
INTRAVENOUS | Status: AC
  Administered 2017-09-03: 17:00:00 via INTRAVENOUS
  Filled 2017-09-03 (×2): qty 2000

## 2017-09-03 NOTE — Progress Notes (Signed)
Initial Nutrition Assessment  DOCUMENTATION CODES:   Severe malnutrition in context of chronic illness  INTERVENTION:   TPN per Pharmacy  NUTRITION DIAGNOSIS:   Severe Malnutrition related to chronic illness, cancer and cancer related treatments as evidenced by severe fat depletion, severe muscle depletion.  GOAL:   Patient will meet greater than or equal to 90% of their needs  MONITOR:   Labs, Weight trends, I & O's(TPN)  REASON FOR ASSESSMENT:   Consult New TPN/TNA  ASSESSMENT:   42 year old gentleman who is status post a robotic assisted low anterior resection with diverting loop ileostomy on April 4 for rectal cancer.  He has had a difficult postoperative course secondary to small bowel obstructions.  He is also had high output from his ostomy.  He is recently been discharged home on TNA.   Pt was just recently discharged on cyclic TPN 3/79. Since discharge pt developed N/V and abdominal pain. Pt denies eating at home. Pt on clear liquid diet.  TPN to be resumed during this admission. Needs have not changed from previous admission.   Weight has not decreased per records.  Medications: IV Zofran PRN, Phenergan PRN Labs reviewed:  Mg/Phos WNL TG: 55 mg/dL  NUTRITION - FOCUSED PHYSICAL EXAM:    Most Recent Value  Orbital Region  Moderate depletion  Upper Arm Region  Severe depletion  Thoracic and Lumbar Region  Unable to assess  Buccal Region  Severe depletion  Temple Region  Moderate depletion  Clavicle Bone Region  Severe depletion  Clavicle and Acromion Bone Region  Severe depletion  Scapular Bone Region  Moderate depletion  Dorsal Hand  Moderate depletion  Patellar Region  Severe depletion  Anterior Thigh Region  Severe depletion  Posterior Calf Region  Severe depletion  Edema (RD Assessment)  None       Diet Order:   Diet Order           Diet clear liquid Room service appropriate? Yes; Fluid consistency: Thin  Diet effective now           EDUCATION NEEDS:   Not appropriate for education at this time  Skin:  Skin Assessment: Reviewed RN Assessment  Last BM:  5/18  Height:   Ht Readings from Last 1 Encounters:  08/31/17 6\' 2"  (1.88 m)    Weight:   Wt Readings from Last 1 Encounters:  09/01/17 158 lb 1.1 oz (71.7 kg)    Ideal Body Weight:     BMI:  Body mass index is 20.29 kg/m.  Estimated Nutritional Needs:   Kcal:  2000-2200  Protein:  95-105g  Fluid:  2L/day   Clayton Bibles, MS, RD, LDN [facility] Dietitian Pager: [phone] After Hours Pager: [phone]

## 2017-09-03 NOTE — Progress Notes (Signed)
   Subjective/Chief Complaint: Fevers again Reports minimal abdominal pain   Objective: Vital signs in last 24 hours: Temp:  [99.4 F (37.4 C)-102.3 F (39.1 C)] 99.4 F (37.4 C) (05/19 0622) Pulse Rate:  [85-111] 85 (05/19 0622) Resp:  [20] 20 (05/19 0622) BP: (97-103)/(58-64) 97/64 (05/19 0622) SpO2:  [97 %-100 %] 97 % (05/19 0622) Last BM Date: 09/02/17  Intake/Output from previous day: 05/18 0701 - 05/19 0700 In: 120 [P.O.:120] Out: 5525 [Urine:1650; BSWHQ:7591] Intake/Output this shift: No intake/output data recorded.  Exam: Chills Abdomen soft, minimally tender, ostomy high output  Lab Results:  Recent Labs    09/02/17 0550 09/03/17 0105  WBC 10.3 8.4  HGB 8.8* 9.0*  HCT 26.4* 27.0*  PLT 201 192   BMET Recent Labs    09/01/17 1614 09/02/17 0550  NA 145 139  K 3.4* 3.6  CL 112* 108  CO2 21* 22  GLUCOSE 160* 149*  BUN 15 13  CREATININE 0.99 0.81  CALCIUM 8.2* 7.6*   PT/INR No results for input(s): LABPROT, INR in the last 72 hours. ABG No results for input(s): PHART, HCO3 in the last 72 hours.  Invalid input(s): PCO2, PO2  Studies/Results: Dg Abd Portable 1v  Result Date: 09/02/2017 CLINICAL DATA:  Small bowel obstruction, rectal cancer. EXAM: PORTABLE ABDOMEN - 1 VIEW COMPARISON:  CT abdomen pelvis 08/31/2017 and abdominal x-ray 08/29/2017. FINDINGS: Marked gaseous distension of small bowel, measuring up to 7.2 cm in diameter. No definite colonic gas. IMPRESSION: High-grade small bowel obstruction. Electronically Signed   By: Lorin Picket M.D.   On: 09/02/2017 07:14    Anti-infectives: Anti-infectives (From admission, onward)   Start     Dose/Rate Route Frequency Ordered Stop   09/03/17 0745  piperacillin-tazobactam (ZOSYN) IVPB 3.375 g     3.375 g 12.5 mL/hr over 240 Minutes Intravenous Every 8 hours 09/03/17 0740        Assessment/Plan: S/p LAR, diverting loop, post op SBO  Cultures pending. May be port infection IV  antibiotics started. Continue TNA cxr today  LOS: 1 day    Jacob Galvan A 09/03/2017

## 2017-09-03 NOTE — Progress Notes (Signed)
PHARMACY - ADULT TOTAL PARENTERAL NUTRITION CONSULT NOTE   Pharmacy Consult for TPN Indication: prolonged ileus Patient Measurements:Weight: 158 lb 1.1 oz (71.7 kg)   Body mass index is 20.29 kg/m. Usual Weight: Wt 09/01/17 71.7 kg  Insulin Requirements: none PTA, 1 U SSI  Current Nutrition: NPO, home TPN PTA  IVF: NS +4mEq K @ 173ml/hr after 1L NS bolus x2 and while TPN is off  Central access: 12/14/16 TPN start date: 07/28/17  ASSESSMENT                                                                                                          HPI: 42 yo M with rectal cancer s/p robotic assisted low anterior resection with diverting loop ileostomy on 07/20/17. Post-op course has been complicated by small bowel obstructions & high output from ileostomy.  He is currently on home TPN.  He presents today with N/V/abdominal pain/dehydration.  He is re-admitted for hydration.   Significant events:   Currently on TPN per [place]- see Pam Chandler's note 5/17 for formula  5/19 febrile, possible port infection, zosyn added   Today:   Glucose -< 150 on TPN, 1 U SSI given  Electrolytes: no labs today -  K (goal >4), Mag  (goal>2),   Renal - stable,   LFTs - WNL x ALT sl elevated  TGs - 55 5/18  Prealbumin - 12.6 5/18  KUB 5/18: high grade SBO; large output from ostomy, CCS holding off NGT  NUTRITIONAL GOALS                                                                                             RD recs (from 5/6): Kcal:2000-2200; Protein:90-100 grams/day Clinimix E 5/20 at a goal rate of 2000 mL/24 hrs + 20% fat emulsion at 20 ml/hr x 12 hr to provide: 100 g/day protein, 2233 Kcal/day.  Inpatient formula from most recent admission (discharged 08/29/17) Clinimix E 5/20, 2000 mL cycled over 12 hours:  50 ml/hr during first hour, followed by 190 ml/hr for 10 hours then 50 ml/hr for final hour ml/hr.   Add NaCl to TPN to make TPN equivalent to NS conc (154  mEq/L)  Continue 20% fat emulsion at 20 ml/hr for 12 hrs  TPN to contain standard multivitamins and trace elements  PLAN  Continue Clinimix E 5/20, 2000 mL cycled over 12 hours:  50 ml/hr during first hour, followed by 190 ml/hr for 10 hours then 50 ml/hr for final hour ml/hr.   Add NaCl to TPN to make TPN equivalent to NS conc (154 mEq/L)  Continue 20% fat emulsion at 20 ml/hr for 12 hrs  TPN to contain standard multivitamins and trace elements  DC SSI 5/19   TPN lab panels on Mondays & Thursdays.   IVF: Hold IVF while TPN infusing then resume at 152ml/hr while TPN off as discussed with Dr Ninfa Linden.  F/u daily.  Eudelia Bunch, Pharm.D. [phone] 09/03/2017 7:18 AM

## 2017-09-03 NOTE — Progress Notes (Signed)
PHARMACY - PHYSICIAN COMMUNICATION CRITICAL VALUE ALERT - BLOOD CULTURE IDENTIFICATION (BCID)  Jacob Colledge. is an 42 y.o. male who presented to [facility] on 09/01/2017 with a chief complaint of abdominal pain, n/v.   Assessment: s/p robotic assisted low anterior resection with diverting loop ileostomy in April for rectal cancer, admitted with dehydration, post-op SBO, elevated lactic acid. Tmax 102.17F  Name of physician (or Provider) Contacted: Dr. Ninfa Linden  Current antibiotics: Zosyn 3.375g IV q8h (each dose infused over 4 hours)  Changes to prescribed antibiotics recommended:  Given current fevers and concern for port infection, continue broad spectrum antibiotics, Zosyn, for now, per MD.    Results for orders placed or performed during the hospital encounter of 09/01/17  Blood Culture ID Panel (Reflexed) (Collected: 09/03/2017  1:01 AM)  Result Value Ref Range   Enterococcus species NOT DETECTED NOT DETECTED   Listeria monocytogenes NOT DETECTED NOT DETECTED   Staphylococcus species NOT DETECTED NOT DETECTED   Staphylococcus aureus NOT DETECTED NOT DETECTED   Streptococcus species NOT DETECTED NOT DETECTED   Streptococcus agalactiae NOT DETECTED NOT DETECTED   Streptococcus pneumoniae NOT DETECTED NOT DETECTED   Streptococcus pyogenes NOT DETECTED NOT DETECTED   Acinetobacter baumannii NOT DETECTED NOT DETECTED   Enterobacteriaceae species DETECTED (A) NOT DETECTED   Enterobacter cloacae complex NOT DETECTED NOT DETECTED   Escherichia coli NOT DETECTED NOT DETECTED   Klebsiella oxytoca DETECTED (A) NOT DETECTED   Klebsiella pneumoniae NOT DETECTED NOT DETECTED   Proteus species NOT DETECTED NOT DETECTED   Serratia marcescens NOT DETECTED NOT DETECTED   Carbapenem resistance NOT DETECTED NOT DETECTED   Haemophilus influenzae NOT DETECTED NOT DETECTED   Neisseria meningitidis NOT DETECTED NOT DETECTED   Pseudomonas aeruginosa NOT DETECTED NOT DETECTED   Candida albicans NOT  DETECTED NOT DETECTED   Candida glabrata NOT DETECTED NOT DETECTED   Candida krusei NOT DETECTED NOT DETECTED   Candida parapsilosis NOT DETECTED NOT DETECTED   Candida tropicalis NOT DETECTED NOT DETECTED    Jacob Galvan 09/03/2017  5:45 PM

## 2017-09-04 LAB — COMPREHENSIVE METABOLIC PANEL
ALBUMIN: 2.1 g/dL — AB (ref 3.5–5.0)
ALK PHOS: 118 U/L (ref 38–126)
ALT: 58 U/L (ref 17–63)
ANION GAP: 7 (ref 5–15)
AST: 26 U/L (ref 15–41)
BILIRUBIN TOTAL: 0.5 mg/dL (ref 0.3–1.2)
BUN: 14 mg/dL (ref 6–20)
CALCIUM: 7.6 mg/dL — AB (ref 8.9–10.3)
CO2: 25 mmol/L (ref 22–32)
Chloride: 109 mmol/L (ref 101–111)
Creatinine, Ser: 0.68 mg/dL (ref 0.61–1.24)
GFR calc Af Amer: >60 mL/min (ref >60)
Glucose, Bld: 105 mg/dL — ABNORMAL HIGH (ref 65–99)
Potassium: 4.2 mmol/L (ref 3.5–5.1)
Sodium: 141 mmol/L (ref 135–145)
TOTAL PROTEIN: 5.3 g/dL — AB (ref 6.5–8.1)

## 2017-09-04 LAB — CBC
HCT: 26.5 % — ABNORMAL LOW (ref 39.0–52.0)
Hemoglobin: 8.8 g/dL — ABNORMAL LOW (ref 13.0–17.0)
MCH: 28.9 pg (ref 26.0–34.0)
MCHC: 33.2 g/dL (ref 30.0–36.0)
MCV: 87.2 fL (ref 78.0–100.0)
PLATELETS: 192 10*3/uL (ref 150–400)
RBC: 3.04 MIL/uL — ABNORMAL LOW (ref 4.22–5.81)
RDW: 14.5 % (ref 11.5–15.5)
WBC: 5.6 10*3/uL (ref 4.0–10.5)

## 2017-09-04 LAB — DIFFERENTIAL
BASOS PCT: 0 %
Basophils Absolute: 0 10*3/uL (ref 0.0–0.1)
EOS PCT: 6 %
Eosinophils Absolute: 0.3 10*3/uL (ref 0.0–0.7)
Lymphocytes Relative: 24 %
Lymphs Abs: 1.3 10*3/uL (ref 0.7–4.0)
MONO ABS: 0.5 10*3/uL (ref 0.1–1.0)
Monocytes Relative: 9 %
Neutro Abs: 3.4 10*3/uL (ref 1.7–7.7)
Neutrophils Relative %: 61 %

## 2017-09-04 LAB — PREALBUMIN: Prealbumin: 9.7 mg/dL — ABNORMAL LOW (ref 18–38)

## 2017-09-04 LAB — PHOSPHORUS: Phosphorus: 3.9 mg/dL (ref 2.5–4.6)

## 2017-09-04 LAB — TRIGLYCERIDES: TRIGLYCERIDES: 94 mg/dL (ref <150)

## 2017-09-04 LAB — MAGNESIUM: MAGNESIUM: 1.7 mg/dL (ref 1.7–2.4)

## 2017-09-04 MED ORDER — TRACE MINERALS CR-CU-MN-SE-ZN 10-1000-500-60 MCG/ML IV SOLN
INTRAVENOUS | Status: DC
  Filled 2017-09-04 (×3): qty 2000

## 2017-09-04 MED ORDER — LIP MEDEX EX OINT
TOPICAL_OINTMENT | CUTANEOUS | Status: AC
  Filled 2017-09-04: qty 7

## 2017-09-04 MED ORDER — FAT EMULSION PLANT BASED 20 % IV EMUL
240.0000 mL | INTRAVENOUS | Status: DC
  Filled 2017-09-04: qty 250

## 2017-09-04 MED ORDER — MAGNESIUM SULFATE 2 GM/50ML IV SOLN
2.0000 g | Freq: Once | INTRAVENOUS | Status: AC
  Administered 2017-09-04: 2 g via INTRAVENOUS
  Filled 2017-09-04: qty 50

## 2017-09-04 NOTE — Progress Notes (Signed)
   Subjective/Chief Complaint: No fevers overnight Reports minimal abdominal pain, tolerating clears   Objective: Vital signs in last 24 hours: Temp:  [97.9 F (36.6 C)-98.4 F (36.9 C)] 98.4 F (36.9 C) (05/20 0534) Pulse Rate:  [80-85] 80 (05/20 1253) Resp:  [16] 16 (05/20 0534) BP: (88-95)/(49-70) 95/70 (05/20 1253) SpO2:  [100 %] 100 % (05/20 1253) Last BM Date: 09/02/17  Intake/Output from previous day: 05/19 0701 - 05/20 0700 In: -  Out: 5550 [Urine:2050; Stool:3500] Intake/Output this shift: Total I/O In: 240 [P.O.:240] Out: 1750 [Urine:850; Stool:900]  Exam:  Abdomen soft, minimally tender, ostomy high output  Lab Results:  Recent Labs    09/03/17 0105 09/04/17 0540  WBC 8.4 5.6  HGB 9.0* 8.8*  HCT 27.0* 26.5*  PLT 192 192   BMET Recent Labs    09/02/17 0550 09/04/17 0540  NA 139 141  K 3.6 4.2  CL 108 109  CO2 22 25  GLUCOSE 149* 105*  BUN 13 14  CREATININE 0.81 0.68  CALCIUM 7.6* 7.6*   PT/INR No results for input(s): LABPROT, INR in the last 72 hours. ABG No results for input(s): PHART, HCO3 in the last 72 hours.  Invalid input(s): PCO2, PO2  Studies/Results: Dg Abd Acute W/chest  Result Date: 09/03/2017 CLINICAL DATA:  Abdominal pain.  Rectal carcinoma.  Loop ileostomy EXAM: DG ABDOMEN ACUTE W/ 1V CHEST COMPARISON:  Radiograph 09/02/2017, CT 08/31/2017 FINDINGS: Port in the anterior chest wall with tip in distal SVC. Lungs are clear. No free air beneath hemidiaphragms Marked decrease in small bowel distension seen on comparison exam. Previous loops of small bowel measured up to 7 cm and now are not evident. Single dilated loop of small bowel does remain in the RIGHT lower quadrant unchanged. RIGHT lower quadrant ileostomy noted. IMPRESSION: 1. Considerable improvement in small bowel obstruction pattern seen on comparison exam. 2. No intraperitoneal free air. Electronically Signed   By: Suzy Bouchard M.D.   On: 09/03/2017 10:46     Anti-infectives: Anti-infectives (From admission, onward)   Start     Dose/Rate Route Frequency Ordered Stop   09/03/17 1000  piperacillin-tazobactam (ZOSYN) IVPB 3.375 g     3.375 g 12.5 mL/hr over 240 Minutes Intravenous Every 8 hours 09/03/17 0740        Assessment/Plan: S/p LAR, diverting loop, post op SBO Port infection  Will remove port in OR in AM Cont IV antibiotics. D/c TNA, Pt would like to try to eat.  If he can't eat, we will place a PICC on 5/22 Plans for ileostomy reversal and LOA in 2-3 weeks    LOS: 2 days    Jorita Bohanon C. 6/71/2458

## 2017-09-04 NOTE — Progress Notes (Signed)
PHARMACY - ADULT TOTAL PARENTERAL NUTRITION CONSULT NOTE   Pharmacy Consult for TPN Indication: prolonged ileus Patient Measurements:Weight: 158 lb 1.1 oz (71.7 kg)   Body mass index is 20.29 kg/m. Usual Weight: Wt 09/01/17 71.7 kg  Insulin Requirements: None in the past 24 hours. No SSI ordered. FBG 105  Current Nutrition: NPO, home TPN PTA  IVF: NS +53mEq K @ 175 mL/hr after while TPN is off  Central access: 12/14/16 TPN start date: 07/28/17  ASSESSMENT                                                                                                          HPI: 42 yo M with rectal cancer s/p robotic assisted low anterior resection with diverting loop ileostomy on 07/20/17. Post-op course has been complicated by small bowel obstructions & high output from ileostomy.  He is currently on home TPN.  He presents today with N/V/abdominal pain/dehydration.  He is re-admitted for hydration.   Significant events:   Currently on TPN per [place]- see Pam Chandler's note 5/17 for formula  5/19 Blood Culture: Klebsiella oxytoca, sensitivities pending  Today:   Glucose 80-150 on TPN  Electrolytes: K 4.2, Mg 1.7, Phos 3.9, Na 141 -- K (goal >4), Mag  (goal>2),   Renal - stable,   LFTs - WNL  TGs - 94 5/20  Prealbumin - 9.7 (5/18)  KUB 5/18: high grade SBO; large output from ostomy, CCS holding off NGT  NUTRITIONAL GOALS                                                                                             RD recs (from 5/19): Kcal:2000-2200; Protein:95-105 grams/day Clinimix E 5/20 at a goal rate of 2000 mL/24 hrs + 20% fat emulsion at 20 ml/hr x 12 hr to provide: 100 g/day protein, 2233 Kcal/day.  Inpatient formula from most recent admission (discharged 08/29/17) Clinimix E 5/20, 2000 mL cycled over 12 hours:  50 ml/hr during first hour, followed by 190 ml/hr for 10 hours then 50 ml/hr for final hour.  Added NaCl to TPN to make TPN equivalent to NS conc (154  mEq/L)  Continue 20% fat emulsion at 20 ml/hr for 12 hrs  TPN to contain standard multivitamins and trace elements  PLAN  Continue Clinimix E 5/20, 2000 mL cycled over 12 hours:  50 ml/hr during first hour, followed by 190 ml/hr for 10 hours then 50 ml/hr for final hour ml/hr.   Continue with NaCl to TPN to make TPN equivalent to NS conc (154 mEq/L)  Ordered magnesium 2 g IV once to replete to goal >2  Continue 20% fat emulsion at 20 ml/hr for 12 hrs  TPN to contain standard multivitamins and trace elements   TPN lab panels on Mondays & Thursdays.   IVF: Hold IVF while TPN infusing then resume at 175 mL/hr while TPN off as pharmacy previously discussed with Dr Ninfa Linden.  F/u daily.  Theodis Shove, PharmD, BCPS Clinical Pharmacist Pager: [phone] 09/04/17 10:38 AM

## 2017-09-04 NOTE — Care Management Note (Signed)
Case Management Note  Patient Details  Name: Jacob Galvan. MRN: [id number] Date of Birth: Sep 09, 1975  Pt from home with mom. Per [facility] pt has HHRN and TPN at home. Will need resumption orders at discharge.  Expected Discharge Date:  (unknown)               Expected Discharge Plan:     In-House Referral:     Discharge planning Services     Post Acute Care Choice:    Choice offered to:     DME Arranged:    DME Agency:     HH Arranged:    HH Agency:     Status of Service:     If discussed at [employer], dates discussed:    Additional CommentsLynnell Catalan, RN 09/04/2017, 10:08 AM  [phone]

## 2017-09-04 NOTE — Progress Notes (Signed)
Met with patient on inpatient unit to offer support and encouragement. Patient voiced that he is encouraged by the plan to remove port and schedule surgery in the near future. Patient stated that he is feeling better and is thankful for support from treatment team.

## 2017-09-05 ENCOUNTER — Encounter (HOSPITAL_COMMUNITY): Payer: Self-pay | Admitting: *Deleted

## 2017-09-05 ENCOUNTER — Inpatient Hospital Stay (HOSPITAL_COMMUNITY): Payer: Medicaid Other | Admitting: Certified Registered Nurse Anesthetist

## 2017-09-05 ENCOUNTER — Inpatient Hospital Stay: Payer: Medicaid Other | Admitting: Nurse Practitioner

## 2017-09-05 ENCOUNTER — Encounter (HOSPITAL_COMMUNITY)
Admission: EM | Disposition: A | Discharge status: Home Health | Payer: Self-pay | Source: Home / Self Care | Attending: General Surgery

## 2017-09-05 HISTORY — PX: PORT-A-CATH REMOVAL: SHX5289

## 2017-09-05 LAB — CULTURE, BLOOD (ROUTINE X 2): SPECIAL REQUESTS: ADEQUATE

## 2017-09-05 LAB — BASIC METABOLIC PANEL
Anion gap: 8 (ref 5–15)
BUN: 5 mg/dL — ABNORMAL LOW (ref 6–20)
CHLORIDE: 106 mmol/L (ref 101–111)
CO2: 24 mmol/L (ref 22–32)
Calcium: 7.7 mg/dL — ABNORMAL LOW (ref 8.9–10.3)
Creatinine, Ser: 0.67 mg/dL (ref 0.61–1.24)
GFR calc Af Amer: >60 mL/min (ref >60)
GFR calc non Af Amer: >60 mL/min (ref >60)
GLUCOSE: 95 mg/dL (ref 65–99)
POTASSIUM: 3.8 mmol/L (ref 3.5–5.1)
SODIUM: 138 mmol/L (ref 135–145)

## 2017-09-05 LAB — GLUCOSE, CAPILLARY
GLUCOSE-CAPILLARY: 105 mg/dL — AB (ref 65–99)
Glucose-Capillary: 86 mg/dL (ref 65–99)

## 2017-09-05 LAB — MAGNESIUM: MAGNESIUM: 1.8 mg/dL (ref 1.7–2.4)

## 2017-09-05 SURGERY — REMOVAL PORT-A-CATH
Anesthesia: Monitor Anesthesia Care

## 2017-09-05 MED ORDER — HYDROMORPHONE HCL 1 MG/ML IJ SOLN: 0.2500 mg | INTRAMUSCULAR | Status: DC | PRN

## 2017-09-05 MED ORDER — 0.9 % SODIUM CHLORIDE (POUR BTL) OPTIME
TOPICAL | Status: DC | PRN
  Administered 2017-09-05: 1000 mL

## 2017-09-05 MED ORDER — MIDAZOLAM HCL 5 MG/5ML IJ SOLN
INTRAMUSCULAR | Status: DC | PRN
  Administered 2017-09-05: 2 mg via INTRAVENOUS

## 2017-09-05 MED ORDER — LIDOCAINE-EPINEPHRINE 1 %-1:100000 IJ SOLN
INTRAMUSCULAR | Status: AC
  Filled 2017-09-05: qty 1

## 2017-09-05 MED ORDER — FENTANYL CITRATE (PF) 100 MCG/2ML IJ SOLN
INTRAMUSCULAR | Status: DC | PRN
  Administered 2017-09-05: 50 ug via INTRAVENOUS

## 2017-09-05 MED ORDER — PROPOFOL 10 MG/ML IV BOLUS
INTRAVENOUS | Status: AC
  Filled 2017-09-05: qty 20

## 2017-09-05 MED ORDER — FENTANYL CITRATE (PF) 100 MCG/2ML IJ SOLN
INTRAMUSCULAR | Status: AC
  Filled 2017-09-05: qty 2

## 2017-09-05 MED ORDER — ONDANSETRON HCL 4 MG/2ML IJ SOLN
INTRAMUSCULAR | Status: DC | PRN
  Administered 2017-09-05: 4 mg via INTRAVENOUS

## 2017-09-05 MED ORDER — ONDANSETRON HCL 4 MG/2ML IJ SOLN
INTRAMUSCULAR | Status: AC
  Filled 2017-09-05: qty 2

## 2017-09-05 MED ORDER — LIDOCAINE 2% (20 MG/ML) 5 ML SYRINGE
INTRAMUSCULAR | Status: AC
  Filled 2017-09-05: qty 5

## 2017-09-05 MED ORDER — MIDAZOLAM HCL 2 MG/2ML IJ SOLN
INTRAMUSCULAR | Status: AC
  Filled 2017-09-05: qty 2

## 2017-09-05 MED ORDER — MUPIROCIN 2 % EX OINT
1.0000 "application " | TOPICAL_OINTMENT | Freq: Two times a day (BID) | CUTANEOUS | Status: AC
  Administered 2017-09-05 – 2017-09-07 (×5): 1 via NASAL
  Filled 2017-09-05 (×2): qty 22

## 2017-09-05 MED ORDER — PROPOFOL 500 MG/50ML IV EMUL
INTRAVENOUS | Status: DC | PRN
  Administered 2017-09-05: 75 ug/kg/min via INTRAVENOUS

## 2017-09-05 MED ORDER — ONDANSETRON HCL 4 MG/2ML IJ SOLN: 4.0000 mg | Freq: Once | INTRAMUSCULAR | Status: DC | PRN

## 2017-09-05 MED ORDER — LIDOCAINE 2% (20 MG/ML) 5 ML SYRINGE
INTRAMUSCULAR | Status: DC | PRN
  Administered 2017-09-05: 40 mg via INTRAVENOUS

## 2017-09-05 MED ORDER — MEPERIDINE HCL 50 MG/ML IJ SOLN: 6.2500 mg | INTRAMUSCULAR | Status: DC | PRN

## 2017-09-05 MED ORDER — BUPIVACAINE-EPINEPHRINE (PF) 0.5% -1:200000 IJ SOLN
INTRAMUSCULAR | Status: AC
  Filled 2017-09-05: qty 30

## 2017-09-05 MED ORDER — LACTATED RINGERS IV SOLN
INTRAVENOUS | Status: DC
  Administered 2017-09-05: 13:00:00 via INTRAVENOUS

## 2017-09-05 MED ORDER — PROPOFOL 10 MG/ML IV BOLUS
INTRAVENOUS | Status: DC | PRN
  Administered 2017-09-05: 20 mg via INTRAVENOUS
  Administered 2017-09-05: 10 mg via INTRAVENOUS
  Administered 2017-09-05: 20 mg via INTRAVENOUS
  Administered 2017-09-05 (×2): 10 mg via INTRAVENOUS

## 2017-09-05 MED ORDER — BUPIVACAINE-EPINEPHRINE 0.5% -1:200000 IJ SOLN
INTRAMUSCULAR | Status: DC | PRN
  Administered 2017-09-05: 10 mL

## 2017-09-05 MED ORDER — BUPIVACAINE HCL (PF) 0.25 % IJ SOLN
INTRAMUSCULAR | Status: AC
  Filled 2017-09-05: qty 30

## 2017-09-05 SURGICAL SUPPLY — 25 items
BLADE SURG 15 STRL LF DISP TIS (BLADE) ×1 IMPLANT
BLADE SURG 15 STRL SS (BLADE) ×1
CHLORAPREP W/TINT 26ML (MISCELLANEOUS) ×2 IMPLANT
DECANTER SPIKE VIAL GLASS SM (MISCELLANEOUS) ×2 IMPLANT
DERMABOND ADVANCED (GAUZE/BANDAGES/DRESSINGS) ×1
DERMABOND ADVANCED .7 DNX12 (GAUZE/BANDAGES/DRESSINGS) ×1 IMPLANT
DRAPE LAPAROTOMY TRNSV 102X78 (DRAPE) ×2 IMPLANT
ELECT PENCIL ROCKER SW 15FT (MISCELLANEOUS) ×2 IMPLANT
ELECT REM PT RETURN 15FT ADLT (MISCELLANEOUS) ×2 IMPLANT
GAUZE 4X4 16PLY RFD (DISPOSABLE) ×2 IMPLANT
GAUZE SPONGE 4X4 12PLY STRL (GAUZE/BANDAGES/DRESSINGS) ×2 IMPLANT
GLOVE BIO SURGEON STRL SZ 6.5 (GLOVE) ×2 IMPLANT
GLOVE BIOGEL PI IND STRL 7.0 (GLOVE) ×1 IMPLANT
GLOVE BIOGEL PI INDICATOR 7.0 (GLOVE) ×1
GOWN STRL REUS W/TWL 2XL LVL3 (GOWN DISPOSABLE) ×2 IMPLANT
GOWN STRL REUS W/TWL XL LVL3 (GOWN DISPOSABLE) ×2 IMPLANT
KIT BASIN OR (CUSTOM PROCEDURE TRAY) ×2 IMPLANT
NEEDLE HYPO 25X1 1.5 SAFETY (NEEDLE) ×2 IMPLANT
PACK BASIC VI WITH GOWN DISP (CUSTOM PROCEDURE TRAY) ×2 IMPLANT
SUT VIC AB 3-0 SH 18 (SUTURE) IMPLANT
SUT VIC AB 4-0 PS2 18 (SUTURE) ×2 IMPLANT
SYR CONTROL 10ML LL (SYRINGE) ×2 IMPLANT
TOWEL OR 17X26 10 PK STRL BLUE (TOWEL DISPOSABLE) ×2 IMPLANT
TOWEL OR NON WOVEN STRL DISP B (DISPOSABLE) ×2 IMPLANT
YANKAUER SUCT BULB TIP 10FT TU (MISCELLANEOUS) IMPLANT

## 2017-09-05 NOTE — Progress Notes (Signed)
   Subjective/Chief Complaint: No fevers overnight Reports minimal abdominal pain, tolerating reg diet   Objective: Vital signs in last 24 hours: Temp:  [98 F (36.7 C)-99.5 F (37.5 C)] 99.5 F (37.5 C) (05/21 0617) Pulse Rate:  [80-89] 86 (05/21 0617) Resp:  [16] 16 (05/21 0617) BP: (88-98)/(50-70) 98/64 (05/21 0617) SpO2:  [100 %] 100 % (05/21 0617) Last BM Date: 09/04/17  Intake/Output from previous day: 05/20 0701 - 05/21 0700 In: 7368.8 [P.O.:240; I.V.:6878.8; IV Piggyback:250] Out: 32 [Urine:2210; Stool:3300] Intake/Output this shift: Total I/O In: -  Out: 150 [Urine:150]  Exam:  Abdomen soft, minimally tender, ostomy high output  Lab Results:  Recent Labs    09/03/17 0105 09/04/17 0540  WBC 8.4 5.6  HGB 9.0* 8.8*  HCT 27.0* 26.5*  PLT 192 192   BMET Recent Labs    09/04/17 0540  NA 141  K 4.2  CL 109  CO2 25  GLUCOSE 105*  BUN 14  CREATININE 0.68  CALCIUM 7.6*   PT/INR No results for input(s): LABPROT, INR in the last 72 hours. ABG No results for input(s): PHART, HCO3 in the last 72 hours.  Invalid input(s): PCO2, PO2  Studies/Results: No results found.  Anti-infectives: Anti-infectives (From admission, onward)   Start     Dose/Rate Route Frequency Ordered Stop   09/03/17 1000  piperacillin-tazobactam (ZOSYN) IVPB 3.375 g     3.375 g 12.5 mL/hr over 240 Minutes Intravenous Every 8 hours 09/03/17 0740        Assessment/Plan: S/p LAR, diverting loop, post op SBO Port infection  Will remove port in OR today Cont IV antibiotics. Will cont to have pt try to eat.  If he can't eat, we will place a PICC to restart TPN Plans for ileostomy reversal and LOA in 2-3 weeks    LOS: 3 days    Jourdin Gens C. 09/29/4313

## 2017-09-05 NOTE — Anesthesia Postprocedure Evaluation (Signed)
Anesthesia Post Note  Patient: Jacob Galvan.  Procedure(s) Performed: REMOVAL PORT-A-CATH (N/A )     Patient location during evaluation: PACU Anesthesia Type: MAC Level of consciousness: awake and alert Pain management: pain level controlled Vital Signs Assessment: post-procedure vital signs reviewed and stable Respiratory status: spontaneous breathing, nonlabored ventilation, respiratory function stable and patient connected to nasal cannula oxygen Cardiovascular status: stable and blood pressure returned to baseline Postop Assessment: no apparent nausea or vomiting Anesthetic complications: no    Last Vitals:  Vitals:   09/05/17 1430 09/05/17 1444  BP: 107/79 105/77  Pulse: 70 77  Resp: 14 18  Temp: 36.9 C 36.9 C  SpO2: 100% 100%    Last Pain:  Vitals:   09/05/17 1944  TempSrc:   PainSc: 9                  [name] DAVID

## 2017-09-05 NOTE — Op Note (Signed)
09/01/2017 - 09/05/2017  1:46 PM  PATIENT:  Jacob Galvan.  42 y.o. male  Patient Care Team: Patient, No Pcp Per as PCP - General (General Practice) Leighton Ruff, MD as Consulting Physician (General Surgery) Ladell Pier, MD as Consulting Physician (Oncology)  PRE-OPERATIVE DIAGNOSIS:   Infected port a cath  POST-OPERATIVE DIAGNOSIS:   Infected port a cath  PROCEDURE:   REMOVAL PORT-A-CATH    Surgeon(s): Leighton Ruff, MD  ASSISTANT: none   ANESTHESIA:   local and MAC  EBL: 12m  Total I/O In: -  Out: 425 [Urine:150; Stool:275]  DRAINS: none   SPECIMEN:  Source of Specimen:  port  DISPOSITION OF SPECIMEN:  PATHOLOGY  COUNTS:  YES  PLAN OF CARE: pt admitted already  PATIENT DISPOSITION:  PACU - hemodynamically stable.  INDICATION: 42y.o. M with fevers and postive blood cultures.  It was felt that his port was infected.  He has responded well to IV antibiotics.     OR FINDINGS: Normal appearing double lumen port  DESCRIPTION: the patient was identified in the preoperative holding area and taken to the OR where they were laid supine on the operating room table.  MAC anesthesia was induced without difficulty. SCDs were also noted to be in place prior to the initiation of anesthesia.  The patient was then prepped and draped in the usual sterile fashion.   A surgical timeout was performed indicating the correct patient, procedure, positioning and need for preoperative antibiotics.     A field block was placed.  I incised the skin at the old scar.  Dissection was carried down to the level of the port using cautery.  The port was removed from its pocket.  There were no attaching sutures.  There was no purulence.  I placed a 3-0 Vicryl pursestring suture around the catheter opening and pulled the port out tying the pursestring around it.  The capsule was then closed with 2-0 Vicryl sutures.  The skin was approximated with 2-0 nylon mattress sutures.  A sterile dressing  was applied.  The patient was awakened from anesthesia and sent to the PACU in stable condition.  All counts were correct.

## 2017-09-05 NOTE — Transfer of Care (Signed)
Immediate Anesthesia Transfer of Care Note  Patient: Jacob Galvan.  Procedure(s) Performed: REMOVAL PORT-A-CATH (N/A )  Patient Location: PACU  Anesthesia Type:MAC  Level of Consciousness: drowsy and patient cooperative  Airway & Oxygen Therapy: Patient Spontanous Breathing and Patient connected to face mask oxygen  Post-op Assessment: Report given to RN and Post -op Vital signs reviewed and stable  Post vital signs: Reviewed and stable  Last Vitals:  Vitals Value Taken Time  BP 97/59 09/05/2017  1:56 PM  Temp    Pulse 80 09/05/2017  1:57 PM  Resp 13 09/05/2017  1:57 PM  SpO2 100 % 09/05/2017  1:57 PM  Vitals shown include unvalidated device data.  Last Pain:  Vitals:   09/05/17 1219  TempSrc: Oral  PainSc: 5       Patients Stated Pain Goal: 3 (18/29/93 7169)  Complications: No apparent anesthesia complications

## 2017-09-05 NOTE — Anesthesia Preprocedure Evaluation (Signed)
Anesthesia Evaluation  Patient identified by MRN, date of birth, ID band Patient awake    Reviewed: Allergy & Precautions, NPO status , Patient's Chart, lab work & pertinent test results  Airway Mallampati: I  TM Distance: >3 FB Neck ROM: Full    Dental   Pulmonary Current Smoker,    Pulmonary exam normal        Cardiovascular Normal cardiovascular exam     Neuro/Psych    GI/Hepatic   Endo/Other    Renal/GU      Musculoskeletal   Abdominal   Peds  Hematology   Anesthesia Other Findings   Reproductive/Obstetrics                             Anesthesia Physical Anesthesia Plan  ASA: II  Anesthesia Plan: MAC   Post-op Pain Management:    Induction: Intravenous  PONV Risk Score and Plan: Treatment may vary due to age or medical condition  Airway Management Planned: Simple Face Mask  Additional Equipment:   Intra-op Plan:   Post-operative Plan:   Informed Consent: I have reviewed the patients History and Physical, chart, labs and discussed the procedure including the risks, benefits and alternatives for the proposed anesthesia with the patient or authorized representative who has indicated his/her understanding and acceptance.     Plan Discussed with: CRNA and Surgeon  Anesthesia Plan Comments:         Anesthesia Quick Evaluation

## 2017-09-06 LAB — GLUCOSE, CAPILLARY
Glucose-Capillary: 99 mg/dL (ref 65–99)
Glucose-Capillary: 92 mg/dL (ref 65–99)

## 2017-09-06 MED ORDER — HYDROMORPHONE HCL 1 MG/ML IJ SOLN
1.0000 mg | INTRAMUSCULAR | Status: DC | PRN
  Administered 2017-09-06 – 2017-09-10 (×19): 1 mg via INTRAVENOUS
  Filled 2017-09-06 (×19): qty 1

## 2017-09-06 MED ORDER — CIPROFLOXACIN HCL 500 MG PO TABS
500.0000 mg | ORAL_TABLET | Freq: Two times a day (BID) | ORAL | Status: DC
  Administered 2017-09-06 – 2017-09-10 (×8): 500 mg via ORAL
  Filled 2017-09-06 (×8): qty 1

## 2017-09-06 NOTE — Progress Notes (Addendum)
1 Day Post-Op   Subjective/Chief Complaint: Reports minimal abdominal pain, no nausea, tolerating reg diet   Objective: Vital signs in last 24 hours: Temp:  [98.7 F (37.1 C)-99.9 F (37.7 C)] 98.7 F (37.1 C) (05/22 1438) Pulse Rate:  [65-88] 88 (05/22 1438) Resp:  [16-18] 18 (05/22 1438) BP: (94-101)/(60-67) 94/60 (05/22 1438) SpO2:  [100 %] 100 % (05/22 1438) Weight:  [68.5 kg (151 lb 0.2 oz)] 68.5 kg (151 lb 0.2 oz) (05/22 0545) Last BM Date: 09/06/17  Intake/Output from previous day: 05/21 0701 - 05/22 0700 In: 940 [P.O.:240; I.V.:700] Out: 3501 [Urine:1350; Stool:2150; Blood:1] Intake/Output this shift: Total I/O In: 3650 [I.V.:3500; IV Piggyback:150] Out: 1800 [Urine:450; Stool:1350]  Exam:  Abdomen soft, minimally tender, ostomy output somewhat better  Lab Results:  Recent Labs    09/04/17 0540  WBC 5.6  HGB 8.8*  HCT 26.5*  PLT 192   BMET Recent Labs    09/04/17 0540 09/05/17 0611  NA 141 138  K 4.2 3.8  CL 109 106  CO2 25 24  GLUCOSE 105* 95  BUN 14 5*  CREATININE 0.68 0.67  CALCIUM 7.6* 7.7*   PT/INR No results for input(s): LABPROT, INR in the last 72 hours. ABG No results for input(s): PHART, HCO3 in the last 72 hours.  Invalid input(s): PCO2, PO2  Studies/Results: No results found.  Anti-infectives: Anti-infectives (From admission, onward)   Start     Dose/Rate Route Frequency Ordered Stop   09/03/17 1000  piperacillin-tazobactam (ZOSYN) IVPB 3.375 g     3.375 g 12.5 mL/hr over 240 Minutes Intravenous Every 8 hours 09/03/17 0740        Assessment/Plan: S/p LAR, diverting loop, post op SBO Port infection: s/p excision   PO antibiotics. Will cont to have pt try to eat.  If he can't eat, we will place a PICC to restart TPN Plans for ileostomy reversal and LOA in 2-3 weeks    LOS: 4 days    Debrah Granderson C. 7/82/9562

## 2017-09-07 ENCOUNTER — Other Ambulatory Visit: Payer: Self-pay

## 2017-09-07 LAB — COMPREHENSIVE METABOLIC PANEL
ALBUMIN: 2.8 g/dL — AB (ref 3.5–5.0)
ALT: 91 U/L — ABNORMAL HIGH (ref 17–63)
AST: 48 U/L — AB (ref 15–41)
Alkaline Phosphatase: 196 U/L — ABNORMAL HIGH (ref 38–126)
Anion gap: 11 (ref 5–15)
BILIRUBIN TOTAL: 0.4 mg/dL (ref 0.3–1.2)
BUN: 5 mg/dL — ABNORMAL LOW (ref 6–20)
CALCIUM: 8.7 mg/dL — AB (ref 8.9–10.3)
CO2: 28 mmol/L (ref 22–32)
CREATININE: 0.84 mg/dL (ref 0.61–1.24)
Chloride: 99 mmol/L — ABNORMAL LOW (ref 101–111)
GFR calc Af Amer: >60 mL/min (ref >60)
GFR calc non Af Amer: >60 mL/min (ref >60)
Glucose, Bld: 104 mg/dL — ABNORMAL HIGH (ref 65–99)
Potassium: 3.7 mmol/L (ref 3.5–5.1)
Sodium: 138 mmol/L (ref 135–145)
TOTAL PROTEIN: 6.8 g/dL (ref 6.5–8.1)

## 2017-09-07 LAB — GLUCOSE, CAPILLARY
GLUCOSE-CAPILLARY: 91 mg/dL (ref 65–99)
Glucose-Capillary: 102 mg/dL — ABNORMAL HIGH (ref 65–99)

## 2017-09-07 NOTE — Progress Notes (Signed)
2 Days Post-Op   Subjective/Chief Complaint: Reports minimal abdominal pain, no nausea, tolerating reg diet   Objective: Vital signs in last 24 hours: Temp:  [98.4 F (36.9 C)-99.4 F (37.4 C)] 98.4 F (36.9 C) (05/23 0605) Pulse Rate:  [86-88] 86 (05/23 0605) Resp:  [18-20] 20 (05/23 0605) BP: (89-97)/(60-65) 89/65 (05/23 0605) SpO2:  [99 %-100 %] 100 % (05/23 0605) Last BM Date: 09/07/17  Intake/Output from previous day: 05/22 0701 - 05/23 0700 In: 5424.2 [P.O.:720; I.V.:4504.2; IV Piggyback:200] Out: 2775 [Urine:825; HQPRF:1638] Intake/Output this shift: No intake/output data recorded.  Exam:  Abdomen soft, minimally tender, ostomy output somewhat better  Lab Results:  No results for input(s): WBC, HGB, HCT, PLT in the last 72 hours. BMET Recent Labs    09/05/17 0611 09/07/17 0740  NA 138 138  K 3.8 3.7  CL 106 99*  CO2 24 28  GLUCOSE 95 104*  BUN 5* 5*  CREATININE 0.67 0.84  CALCIUM 7.7* 8.7*   PT/INR No results for input(s): LABPROT, INR in the last 72 hours. ABG No results for input(s): PHART, HCO3 in the last 72 hours.  Invalid input(s): PCO2, PO2  Studies/Results: No results found.  Anti-infectives: Anti-infectives (From admission, onward)   Start     Dose/Rate Route Frequency Ordered Stop   09/06/17 2000  ciprofloxacin (CIPRO) tablet 500 mg     500 mg Oral 2 times daily 09/06/17 1554     09/03/17 1000  piperacillin-tazobactam (ZOSYN) IVPB 3.375 g  Status:  Discontinued     3.375 g 12.5 mL/hr over 240 Minutes Intravenous Every 8 hours 09/03/17 0740 09/06/17 1554      Assessment/Plan: S/p LAR, diverting loop, post op SBO Port infection: s/p excision   PO antibiotics. Will cont to have pt try to eat and drink. UOP decreasing since IVF's stopped If it looks like he can't keep up with fluids, will d/c with home IVF's and PICC tomorrow Plans for ileostomy reversal and LOA in 2-3 weeks    LOS: 5 days    Katalaya Beel C. 4/66/5993

## 2017-09-08 LAB — GLUCOSE, CAPILLARY
Glucose-Capillary: 106 mg/dL — ABNORMAL HIGH (ref 65–99)
Glucose-Capillary: 106 mg/dL — ABNORMAL HIGH (ref 65–99)

## 2017-09-08 MED ORDER — FAMOTIDINE 20 MG PO TABS
40.0000 mg | ORAL_TABLET | Freq: Every day | ORAL | Status: DC
  Administered 2017-09-08 – 2017-09-10 (×3): 40 mg via ORAL
  Filled 2017-09-08 (×3): qty 2

## 2017-09-08 NOTE — Discharge Instructions (Signed)
1. DIET: Follow a light bland diet the first 24 hours after arrival home, such as soup, liquids, crackers, etc.  Be sure to include lots of fluids daily.  Avoid fast food or heavy meals as your are more likely to get nauseated.  Do not eat any uncooked fruits or vegetables for the next 2 weeks as your colon heals. 2. Take your usually prescribed home medications unless otherwise directed. 3. PAIN CONTROL: a. Pain is best controlled by a usual combination of three different methods TOGETHER: i. Ice/Heat ii. Over the counter pain medication iii. Prescription pain medication b. Most patients will experience some swelling and bruising around the incisions.  Ice packs or heating pads (30-60 minutes up to 6 times a day) will help. Use ice for the first few days to help decrease swelling and bruising, then switch to heat to help relax tight/sore spots and speed recovery.  Some people prefer to use ice alone, heat alone, alternating between ice & heat.  Experiment to what works for you.  Swelling and bruising can take several weeks to resolve.   c. It is helpful to take an over-the-counter pain medication regularly for the first few weeks.  Choose one of the following that works best for you: i. Naproxen (Aleve, etc)  Two 220mg  tabs twice a day ii. Ibuprofen (Advil, etc) Three 200mg  tabs four times a day (every meal & bedtime) iii. Acetaminophen (Tylenol, etc) 500-650mg  four times a day (every meal & bedtime) d. A  prescription for pain medication (such as oxycodone, hydrocodone, etc) should be given to you upon discharge.  Take your pain medication as prescribed.  i. If you are having problems/concerns with the prescription medicine (does not control pain, nausea, vomiting, rash, itching, etc), please call us [phone] to see if we need to switch you to a different pain medicine that will work better for you and/or control your side effect better. ii. If you need a refill on your pain medication,  please contact your pharmacy.  They will contact our office to request authorization. Prescriptions will not be filled after 5 pm or on week-ends. 4. Wash / shower every day.  You may shower over the incision / wound.  Avoid baths until the skin is fully healed.  Continue to shower over incision(s) after the dressing is off. 5.   You may leave the incision open to air.  You may replace a dressing/Band-Aid to cover the incision for comfort if you wish. 6. ACTIVITIES as tolerated:   a. You may resume regular (light) daily activities beginning the next day--such as daily self-care, walking, climbing stairs--gradually increasing activities as tolerated.  If you can walk 30 minutes without difficulty, it is safe to try more intense activity such as jogging, treadmill, bicycling, low-impact aerobics, swimming, etc. b. Save the most intensive and strenuous activity for last such as sit-ups, heavy lifting, contact sports, etc  Refrain from any heavy lifting or straining until you are off narcotics for pain control.   c. DO NOT PUSH THROUGH PAIN.  Let pain be your guide: If it hurts to do something, don't do it.  Pain is your body warning you to avoid that activity for another week until the pain goes down. d. You may drive when you are no longer taking prescription pain medication, you can comfortably wear a seatbelt, and you can safely maneuver your car and apply brakes. e. Dennis Bast may have sexual intercourse when it is comfortable.  7. FOLLOW UP  in our office a. Please call [facility] at [phone] to set up an appointment to see your surgeon in the office for a follow-up appointment approximately 1-2 weeks after your surgery. b. Make sure that you call for this appointment the day you arrive home to insure a convenient appointment time. 10. IF YOU HAVE DISABILITY OR FAMILY LEAVE FORMS, BRING THEM TO THE OFFICE FOR PROCESSING.  DO NOT GIVE THEM TO YOUR DOCTOR.   WHEN TO CALL us [phone]: 1. Poor pain  control 2. Reactions / problems with new medications (rash/itching, nausea, etc)  3. Fever over 101.5 F (38.5 C) 4. Inability to urinate 5. Nausea and/or vomiting 6. Worsening swelling or bruising 7. Continued bleeding from incision. 8. Increased pain, redness, or drainage from the incision  The clinic staff is available to answer your questions during regular business hours (8:30am-5pm).  Please dont hesitate to call and ask to speak to one of our nurses for clinical concerns.   A surgeon from [facility] is always on call at the hospitals   If you have a medical emergency, go to the nearest emergency room or call 911.    [facility], [address], [place], [place], [place]  [address] ? MAIN: [phone] ? TOLL FREE: [phone] ? FAX [phone] [email or web address]

## 2017-09-08 NOTE — Progress Notes (Signed)
3 Days Post-Op   Subjective/Chief Complaint: Reports some crampy abdominal pain after breakfast, no nausea, tolerating reg diet   Objective: Vital signs in last 24 hours: Temp:  [98.2 F (36.8 C)-98.3 F (36.8 C)] 98.3 F (36.8 C) (05/24 1415) Pulse Rate:  [83-97] 97 (05/24 1415) Resp:  [16] 16 (05/24 1415) BP: (83-88)/(55-61) 83/61 (05/24 1415) SpO2:  [100 %] 100 % (05/24 1415) Weight:  [65 kg (143 lb 4.8 oz)] 65 kg (143 lb 4.8 oz) (05/24 0637) Last BM Date: 09/08/17  Intake/Output from previous day: 05/23 0701 - 05/24 0700 In: 600 [P.O.:600] Out: 1350 [Urine:450; Stool:900] Intake/Output this shift: Total I/O In: 720 [P.O.:720] Out: 1125 [Urine:375; Stool:750]  Exam:  Abdomen soft, minimally tender, ostomy output somewhat better  Lab Results:  No results for input(s): WBC, HGB, HCT, PLT in the last 72 hours. BMET Recent Labs    09/07/17 0740  NA 138  K 3.7  CL 99*  CO2 28  GLUCOSE 104*  BUN 5*  CREATININE 0.84  CALCIUM 8.7*   PT/INR No results for input(s): LABPROT, INR in the last 72 hours. ABG No results for input(s): PHART, HCO3 in the last 72 hours.  Invalid input(s): PCO2, PO2  Studies/Results: No results found.  Anti-infectives: Anti-infectives (From admission, onward)   Start     Dose/Rate Route Frequency Ordered Stop   09/06/17 2000  ciprofloxacin (CIPRO) tablet 500 mg     500 mg Oral 2 times daily 09/06/17 1554     09/03/17 1000  piperacillin-tazobactam (ZOSYN) IVPB 3.375 g  Status:  Discontinued     3.375 g 12.5 mL/hr over 240 Minutes Intravenous Every 8 hours 09/03/17 0740 09/06/17 1554      Assessment/Plan: S/p LAR, diverting loop, post op SBO Port infection: s/p excision   PO antibiotics x10 days for port infection. Will cont to have pt try to eat and drink. UOP adequate and Cr wnl since IVF's stopped Pt would like to make sure that his symptoms don't worsen over the next 24 hrs. If it looks like he can't keep up with fluids,  will d/c with home IVF's and PICC or have him f/u in infusion clinic Plans for ileostomy reversal and LOA in 2-3 weeks    LOS: 6 days    Jashaun Penrose C. 1/61/0960

## 2017-09-09 NOTE — Progress Notes (Signed)
[facility] Office:  [phone] General Surgery Progress Note   LOS: 7 days  POD -  4 Days Post-Op  Chief Complaint: Port infected  Assessment and Plan: 1.  REMOVAL PORT-A-CATH - 09/05/2017 Marcello Moores  On Cipro  2.  Dehydration - high out put ileostomy output  Doing better with PO intake, but not ready to go home. 3.  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  Malnutrition 5.  DVT prophylaxis - Lovenox   Active Problems:   Dehydration  Subjective:  Better PO intake, but still not quite ready to go home.  He has been walking in the hall.  Objective:   Vitals:   09/08/17 2103 09/09/17 0611  BP: (!) 81/70 (!) 82/54  Pulse: 96 74  Resp: 16 16  Temp: 98.1 F (36.7 C) 97.8 F (36.6 C)  SpO2: 97% 94%     Intake/Output from previous day:  05/24 0701 - 05/25 0700 In: 960 [P.O.:960] Out: 2640 [Urine:1075; TIRWE:3154]  Intake/Output this shift:  No intake/output data recorded.   Physical Exam:   General: Thin AA M who is alert and oriented.    HEENT: Normal. Pupils equal. .   Lungs: Clear   Abdomen: Soft   Wound: Ostomy in RLQ with output   Lab Results:   No results for input(s): WBC, HGB, HCT, PLT in the last 72 hours.  BMET   Recent Labs    09/07/17 0740  NA 138  K 3.7  CL 99*  CO2 28  GLUCOSE 104*  BUN 5*  CREATININE 0.84  CALCIUM 8.7*    PT/INR  No results for input(s): LABPROT, INR in the last 72 hours.  ABG  No results for input(s): PHART, HCO3 in the last 72 hours.  Invalid input(s): PCO2, PO2   Studies/Results:  No results found.   Anti-infectives:   Anti-infectives (From admission, onward)   Start     Dose/Rate Route Frequency Ordered Stop   09/06/17 2000  ciprofloxacin (CIPRO) tablet 500 mg     500 mg Oral 2 times daily 09/06/17 1554     09/03/17 1000  piperacillin-tazobactam (ZOSYN) IVPB 3.375 g  Status:  Discontinued     3.375 g 12.5 mL/hr over 240 Minutes Intravenous Every 8 hours 09/03/17 0740 09/06/17  1554      Alphonsa Overall, MD, FACS Pager: [facility] Office: [phone] 09/09/2017

## 2017-09-10 MED ORDER — CIPROFLOXACIN HCL 500 MG PO TABS: 500.0000 mg | ORAL_TABLET | Freq: Two times a day (BID) | ORAL | 0 refills | Status: DC

## 2017-09-10 NOTE — Discharge Summary (Signed)
Physician Discharge Summary  Patient ID:  Jacob Galvan.  MRN: [id number]  DOB/AGE: Oct 18, 1975 42 y.o.  Admit date: 09/01/2017 Discharge date: 09/10/2017  Discharge Diagnoses:  1.  REMOVAL PORT-A-CATH - 09/05/2017 Jacob Galvan             On Cipro  2.  Dehydration - high out put ileostomy output             Doing better with PO intake, but not ready to go home. 3.  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  Malnutrition   Active Problems:   Dehydration  Operation: Procedure(s): REMOVAL PORT-A-CATH on 09/05/2017 Jacob Galvan  Discharged Condition: good  Hospital Course: Jacob Perrelli. is an 42 y.o. male whose primary care physician is Patient, No Pcp Per and who was admitted 09/01/2017 with a chief complaint of  Chief Complaint  Patient presents with  . Hypotension  . Ca Pt  .   He was brought to the operating room on 09/05/2017 and underwent  REMOVAL PORT-A-CATH.  He is doing well with his ostomy output.   He will continue the Cipro for t more days.  The discharge instructions were reviewed with the patient.  Consults: None  Significant Diagnostic Studies: Results for orders placed or performed during the hospital encounter of 09/01/17  Culture, blood (routine x 2)  Result Value Ref Range   Specimen Description      BLOOD RIGHT ANTECUBITAL Performed at [facility], [place] [address], [place], [place] [address]    Special Requests      BOTTLES DRAWN AEROBIC ONLY Blood Culture results may not be optimal due to an inadequate volume of blood received in culture bottles Performed at [facility] [address], [place], [place] [address]    Culture  Setup Time      GRAM NEGATIVE RODS AEROBIC BOTTLE ONLY CRITICAL VALUE NOTED.  VALUE IS CONSISTENT WITH PREVIOUSLY REPORTED AND CALLED VALUE.    Culture (A)     KLEBSIELLA OXYTOCA SUSCEPTIBILITIES PERFORMED ON PREVIOUS CULTURE WITHIN THE LAST 5 DAYS. Performed at [facility], [place] [address], [place], [place] [address]    Report Status 09/05/2017 FINAL   Culture, blood (routine x 2)  Result Value Ref Range   Specimen Description      BLOOD LEFT ANTECUBITAL Performed at [facility], [place] [address] [place], [place] [address]    Special Requests      BOTTLES DRAWN AEROBIC AND ANAEROBIC Blood Culture adequate volume Performed at [facility] [address], [place], [place] [address]    Culture  Setup Time      GRAM NEGATIVE RODS IN BOTH AEROBIC AND ANAEROBIC BOTTLES CRITICAL RESULT CALLED TO, READ BACK BY AND VERIFIED WITH: J GADHIA,PHARMD AT 1712 09/03/17 BY L BENFIELD Performed at [facility], [place] [address], [place], [place] [address]    Culture KLEBSIELLA OXYTOCA (A)    Report Status 09/05/2017 FINAL    Organism ID, Bacteria KLEBSIELLA OXYTOCA       Susceptibility   Klebsiella oxytoca - MIC*    AMPICILLIN >=32 RESISTANT Resistant     CEFAZOLIN 8 SENSITIVE Sensitive     CEFEPIME <=1 SENSITIVE Sensitive     CEFTAZIDIME <=1 SENSITIVE Sensitive     CEFTRIAXONE <=1 SENSITIVE Sensitive     CIPROFLOXACIN <=0.25 SENSITIVE Sensitive  GENTAMICIN <=1 SENSITIVE Sensitive     IMIPENEM <=0.25 SENSITIVE Sensitive     TRIMETH/SULFA <=20 SENSITIVE Sensitive     AMPICILLIN/SULBACTAM 16 INTERMEDIATE Intermediate     PIP/TAZO <=4 SENSITIVE Sensitive     Extended ESBL NEGATIVE Sensitive     * KLEBSIELLA OXYTOCA  Blood Culture ID Panel (Reflexed)  Result Value Ref Range   Enterococcus species NOT DETECTED NOT DETECTED   Listeria monocytogenes NOT DETECTED NOT DETECTED   Staphylococcus species NOT DETECTED NOT DETECTED   Staphylococcus aureus NOT DETECTED NOT DETECTED   Streptococcus species NOT DETECTED NOT DETECTED   Streptococcus agalactiae NOT DETECTED NOT DETECTED   Streptococcus pneumoniae NOT DETECTED NOT DETECTED   Streptococcus pyogenes NOT DETECTED NOT DETECTED   Acinetobacter baumannii NOT  DETECTED NOT DETECTED   Enterobacteriaceae species DETECTED (A) NOT DETECTED   Enterobacter cloacae complex NOT DETECTED NOT DETECTED   Escherichia coli NOT DETECTED NOT DETECTED   Klebsiella oxytoca DETECTED (A) NOT DETECTED   Klebsiella pneumoniae NOT DETECTED NOT DETECTED   Proteus species NOT DETECTED NOT DETECTED   Serratia marcescens NOT DETECTED NOT DETECTED   Carbapenem resistance NOT DETECTED NOT DETECTED   Haemophilus influenzae NOT DETECTED NOT DETECTED   Neisseria meningitidis NOT DETECTED NOT DETECTED   Pseudomonas aeruginosa NOT DETECTED NOT DETECTED   Candida albicans NOT DETECTED NOT DETECTED   Candida glabrata NOT DETECTED NOT DETECTED   Candida krusei NOT DETECTED NOT DETECTED   Candida parapsilosis NOT DETECTED NOT DETECTED   Candida tropicalis NOT DETECTED NOT DETECTED  CBC with Differential  Result Value Ref Range   WBC 13.9 (H) 4.0 - 10.5 K/uL   RBC 3.27 (L) 4.22 - 5.81 MIL/uL   Hemoglobin 9.5 (L) 13.0 - 17.0 g/dL   HCT 29.1 (L) 39.0 - 52.0 %   MCV 89.0 78.0 - 100.0 fL   MCH 29.1 26.0 - 34.0 pg   MCHC 32.6 30.0 - 36.0 g/dL   RDW 14.3 11.5 - 15.5 %   Platelets 242 150 - 400 K/uL   Neutrophils Relative % 94 %   Neutro Abs 13.0 (H) 1.7 - 7.7 K/uL   Lymphocytes Relative 2 %   Lymphs Abs 0.3 (L) 0.7 - 4.0 K/uL   Monocytes Relative 4 %   Monocytes Absolute 0.6 0.1 - 1.0 K/uL   Eosinophils Relative 0 %   Eosinophils Absolute 0.0 0.0 - 0.7 K/uL   Basophils Relative 0 %   Basophils Absolute 0.0 0.0 - 0.1 K/uL  Comprehensive metabolic panel  Result Value Ref Range   Sodium 145 135 - 145 mmol/L   Potassium 3.4 (L) 3.5 - 5.1 mmol/L   Chloride 112 (H) 101 - 111 mmol/L   CO2 21 (L) 22 - 32 mmol/L   Glucose, Bld 160 (H) 65 - 99 mg/dL   BUN 15 6 - 20 mg/dL   Creatinine, Ser 0.99 0.61 - 1.24 mg/dL   Calcium 8.2 (L) 8.9 - 10.3 mg/dL   Total Protein 5.7 (L) 6.5 - 8.1 g/dL   Albumin 2.5 (L) 3.5 - 5.0 g/dL   AST 51 (H) 15 - 41 U/L   ALT 98 (H) 17 - 63 U/L    Alkaline Phosphatase 134 (H) 38 - 126 U/L   Total Bilirubin 0.7 0.3 - 1.2 mg/dL   GFR calc non Af Amer >60 >60 mL/min   GFR calc Af Amer >60 >60 mL/min   Anion gap 12 5 - 15  Lipase, blood  Result Value Ref Range  Lipase 38 11 - 51 U/L  Urinalysis, Routine w reflex microscopic  Result Value Ref Range   Color, Urine YELLOW YELLOW   APPearance CLEAR CLEAR   Specific Gravity, Urine 1.014 1.005 - 1.030   pH 5.0 5.0 - 8.0   Glucose, UA NEGATIVE NEGATIVE mg/dL   Hgb urine dipstick NEGATIVE NEGATIVE   Bilirubin Urine NEGATIVE NEGATIVE   Ketones, ur NEGATIVE NEGATIVE mg/dL   Protein, ur NEGATIVE NEGATIVE mg/dL   Nitrite NEGATIVE NEGATIVE   Leukocytes, UA NEGATIVE NEGATIVE  Magnesium  Result Value Ref Range   Magnesium 1.3 (L) 1.7 - 2.4 mg/dL  Phosphorus  Result Value Ref Range   Phosphorus 4.6 2.5 - 4.6 mg/dL  Comprehensive metabolic panel  Result Value Ref Range   Sodium 139 135 - 145 mmol/L   Potassium 3.6 3.5 - 5.1 mmol/L   Chloride 108 101 - 111 mmol/L   CO2 22 22 - 32 mmol/L   Glucose, Bld 149 (H) 65 - 99 mg/dL   BUN 13 6 - 20 mg/dL   Creatinine, Ser 0.81 0.61 - 1.24 mg/dL   Calcium 7.6 (L) 8.9 - 10.3 mg/dL   Total Protein 5.3 (L) 6.5 - 8.1 g/dL   Albumin 2.2 (L) 3.5 - 5.0 g/dL   AST 31 15 - 41 U/L   ALT 81 (H) 17 - 63 U/L   Alkaline Phosphatase 123 38 - 126 U/L   Total Bilirubin 0.5 0.3 - 1.2 mg/dL   GFR calc non Af Amer >60 >60 mL/min   GFR calc Af Amer >60 >60 mL/min   Anion gap 9 5 - 15  Prealbumin  Result Value Ref Range   Prealbumin 12.6 (L) 18 - 38 mg/dL  Magnesium  Result Value Ref Range   Magnesium 2.1 1.7 - 2.4 mg/dL  Phosphorus  Result Value Ref Range   Phosphorus 2.7 2.5 - 4.6 mg/dL  Triglycerides  Result Value Ref Range   Triglycerides 55 <150 mg/dL  CBC  Result Value Ref Range   WBC 10.3 4.0 - 10.5 K/uL   RBC 3.00 (L) 4.22 - 5.81 MIL/uL   Hemoglobin 8.8 (L) 13.0 - 17.0 g/dL   HCT 26.4 (L) 39.0 - 52.0 %   MCV 88.0 78.0 - 100.0 fL   MCH 29.3  26.0 - 34.0 pg   MCHC 33.3 30.0 - 36.0 g/dL   RDW 14.4 11.5 - 15.5 %   Platelets 201 150 - 400 K/uL  Differential  Result Value Ref Range   Neutrophils Relative % 90 %   Neutro Abs 9.3 (H) 1.7 - 7.7 K/uL   Lymphocytes Relative 6 %   Lymphs Abs 0.6 (L) 0.7 - 4.0 K/uL   Monocytes Relative 4 %   Monocytes Absolute 0.4 0.1 - 1.0 K/uL   Eosinophils Relative 0 %   Eosinophils Absolute 0.0 0.0 - 0.7 K/uL   Basophils Relative 0 %   Basophils Absolute 0.0 0.0 - 0.1 K/uL  HIV antibody (Routine Testing)  Result Value Ref Range   HIV Screen 4th Generation wRfx Non Reactive Non Reactive  Lactic acid, plasma  Result Value Ref Range   Lactic Acid, Venous 1.8 0.5 - 1.9 mmol/L  Glucose, capillary  Result Value Ref Range   Glucose-Capillary 145 (H) 65 - 99 mg/dL  Glucose, capillary  Result Value Ref Range   Glucose-Capillary 126 (H) 65 - 99 mg/dL  Glucose, capillary  Result Value Ref Range   Glucose-Capillary 115 (H) 65 - 99 mg/dL  CBC  Result  Value Ref Range   WBC 8.4 4.0 - 10.5 K/uL   RBC 3.07 (L) 4.22 - 5.81 MIL/uL   Hemoglobin 9.0 (L) 13.0 - 17.0 g/dL   HCT 27.0 (L) 39.0 - 52.0 %   MCV 87.9 78.0 - 100.0 fL   MCH 29.3 26.0 - 34.0 pg   MCHC 33.3 30.0 - 36.0 g/dL   RDW 14.3 11.5 - 15.5 %   Platelets 192 150 - 400 K/uL  Glucose, capillary  Result Value Ref Range   Glucose-Capillary 109 (H) 65 - 99 mg/dL  Glucose, capillary  Result Value Ref Range   Glucose-Capillary 150 (H) 65 - 99 mg/dL   Comment 1 Notify RN   Glucose, capillary  Result Value Ref Range   Glucose-Capillary 98 65 - 99 mg/dL   Comment 1 Notify RN   Comprehensive metabolic panel  Result Value Ref Range   Sodium 141 135 - 145 mmol/L   Potassium 4.2 3.5 - 5.1 mmol/L   Chloride 109 101 - 111 mmol/L   CO2 25 22 - 32 mmol/L   Glucose, Bld 105 (H) 65 - 99 mg/dL   BUN 14 6 - 20 mg/dL   Creatinine, Ser 0.68 0.61 - 1.24 mg/dL   Calcium 7.6 (L) 8.9 - 10.3 mg/dL   Total Protein 5.3 (L) 6.5 - 8.1 g/dL   Albumin 2.1 (L)  3.5 - 5.0 g/dL   AST 26 15 - 41 U/L   ALT 58 17 - 63 U/L   Alkaline Phosphatase 118 38 - 126 U/L   Total Bilirubin 0.5 0.3 - 1.2 mg/dL   GFR calc non Af Amer >60 >60 mL/min   GFR calc Af Amer >60 >60 mL/min   Anion gap 7 5 - 15  Magnesium  Result Value Ref Range   Magnesium 1.7 1.7 - 2.4 mg/dL  Phosphorus  Result Value Ref Range   Phosphorus 3.9 2.5 - 4.6 mg/dL  CBC  Result Value Ref Range   WBC 5.6 4.0 - 10.5 K/uL   RBC 3.04 (L) 4.22 - 5.81 MIL/uL   Hemoglobin 8.8 (L) 13.0 - 17.0 g/dL   HCT 26.5 (L) 39.0 - 52.0 %   MCV 87.2 78.0 - 100.0 fL   MCH 28.9 26.0 - 34.0 pg   MCHC 33.2 30.0 - 36.0 g/dL   RDW 14.5 11.5 - 15.5 %   Platelets 192 150 - 400 K/uL  Differential  Result Value Ref Range   Neutrophils Relative % 61 %   Neutro Abs 3.4 1.7 - 7.7 K/uL   Lymphocytes Relative 24 %   Lymphs Abs 1.3 0.7 - 4.0 K/uL   Monocytes Relative 9 %   Monocytes Absolute 0.5 0.1 - 1.0 K/uL   Eosinophils Relative 6 %   Eosinophils Absolute 0.3 0.0 - 0.7 K/uL   Basophils Relative 0 %   Basophils Absolute 0.0 0.0 - 0.1 K/uL  Triglycerides  Result Value Ref Range   Triglycerides 94 <150 mg/dL  Prealbumin  Result Value Ref Range   Prealbumin 9.7 (L) 18 - 38 mg/dL  Basic metabolic panel  Result Value Ref Range   Sodium 138 135 - 145 mmol/L   Potassium 3.8 3.5 - 5.1 mmol/L   Chloride 106 101 - 111 mmol/L   CO2 24 22 - 32 mmol/L   Glucose, Bld 95 65 - 99 mg/dL   BUN 5 (L) 6 - 20 mg/dL   Creatinine, Ser 0.67 0.61 - 1.24 mg/dL   Calcium 7.7 (L) 8.9 - 10.3  mg/dL   GFR calc non Af Amer >60 >60 mL/min   GFR calc Af Amer >60 >60 mL/min   Anion gap 8 5 - 15  Magnesium  Result Value Ref Range   Magnesium 1.8 1.7 - 2.4 mg/dL  Glucose, capillary  Result Value Ref Range   Glucose-Capillary 86 65 - 99 mg/dL  Glucose, capillary  Result Value Ref Range   Glucose-Capillary 105 (H) 65 - 99 mg/dL  Glucose, capillary  Result Value Ref Range   Glucose-Capillary 99 65 - 99 mg/dL  Glucose,  capillary  Result Value Ref Range   Glucose-Capillary 92 65 - 99 mg/dL  Comprehensive metabolic panel  Result Value Ref Range   Sodium 138 135 - 145 mmol/L   Potassium 3.7 3.5 - 5.1 mmol/L   Chloride 99 (L) 101 - 111 mmol/L   CO2 28 22 - 32 mmol/L   Glucose, Bld 104 (H) 65 - 99 mg/dL   BUN 5 (L) 6 - 20 mg/dL   Creatinine, Ser 0.84 0.61 - 1.24 mg/dL   Calcium 8.7 (L) 8.9 - 10.3 mg/dL   Total Protein 6.8 6.5 - 8.1 g/dL   Albumin 2.8 (L) 3.5 - 5.0 g/dL   AST 48 (H) 15 - 41 U/L   ALT 91 (H) 17 - 63 U/L   Alkaline Phosphatase 196 (H) 38 - 126 U/L   Total Bilirubin 0.4 0.3 - 1.2 mg/dL   GFR calc non Af Amer >60 >60 mL/min   GFR calc Af Amer >60 >60 mL/min   Anion gap 11 5 - 15  Glucose, capillary  Result Value Ref Range   Glucose-Capillary 91 65 - 99 mg/dL   Comment 1 Notify RN   Glucose, capillary  Result Value Ref Range   Glucose-Capillary 102 (H) 65 - 99 mg/dL   Comment 1 Notify RN   Glucose, capillary  Result Value Ref Range   Glucose-Capillary 106 (H) 65 - 99 mg/dL   Comment 1 Notify RN   Glucose, capillary  Result Value Ref Range   Glucose-Capillary 106 (H) 65 - 99 mg/dL   Comment 1 Notify RN   I-Stat CG4 Lactic Acid, ED  Result Value Ref Range   Lactic Acid, Venous 3.94 (HH) 0.5 - 1.9 mmol/L   Comment NOTIFIED PHYSICIAN     Ct Abdomen Pelvis W Contrast  Result Date: 08/31/2017 CLINICAL DATA:  Acute onset of generalized abdominal pain, nausea and vomiting. EXAM: CT ABDOMEN AND PELVIS WITH CONTRAST TECHNIQUE: Multidetector CT imaging of the abdomen and pelvis was performed using the standard protocol following bolus administration of intravenous contrast. CONTRAST:  133mL ISOVUE-300 IOPAMIDOL (ISOVUE-300) INJECTION 61% COMPARISON:  None. FINDINGS: Lower chest: Minimal bibasilar atelectasis is noted. Scattered coronary artery calcification is noted. Wall thickening at the distal esophagus may reflect chronic inflammation or esophagitis. Hepatobiliary: A 1.5 cm hypodensity  is noted at the hepatic dome. The liver is otherwise unremarkable. The gallbladder is not well assessed given surrounding ascites. The common bile duct remains normal in caliber. Pancreas: The pancreas is within normal limits. Spleen: The spleen is unremarkable in appearance. Adrenals/Urinary Tract: The adrenal glands are unremarkable in appearance. The kidneys are within normal limits. There is no evidence of hydronephrosis. No renal or ureteral stones are identified. No perinephric stranding is seen. Stomach/Bowel: The patient is status post diverting loop ileostomy at the terminal ileum. There is diffuse distention of small-bowel loops to 5.6 cm in maximal diameter, with multiple transition points at the pelvis due to extensive scarring  and adhesions. Findings are compatible with relatively high-grade small bowel obstruction. The colon is decompressed. Postoperative change is noted at the level of the rectum. Trace fluid about the appendix likely tracks from the small bowel process. The appendix is grossly unremarkable in appearance. The stomach is partially filled with fluid and grossly unremarkable. Vascular/Lymphatic: The abdominal aorta is unremarkable in appearance. The inferior vena cava is grossly unremarkable. Mildly prominent retroperitoneal nodes measure up to 1.0 cm in short axis. No pelvic sidewall lymphadenopathy is identified. Reproductive: The prostate is not well characterized. The bladder is mildly distended and grossly unremarkable. Other: No additional soft tissue abnormalities are seen. Musculoskeletal: No acute osseous abnormalities are identified. The visualized musculature is unremarkable in appearance. IMPRESSION: 1. Diffuse distention of small-bowel loops to 5.6 cm in maximal diameter, with multiple transition points at the pelvis due to extensive scarring and adhesions. Findings compatible with relatively high-grade small bowel obstruction. 2. Wall thickening at the distal esophagus may  reflect chronic inflammation or esophagitis. 3. Diverting loop ileostomy at the terminal ileum is unremarkable in appearance. The colon is decompressed. 4. Scattered coronary artery calcifications seen. 5. 1.5 cm nonspecific hypodensity at the hepatic dome. These results were called by telephone at the time of interpretation on 08/31/2017 at 5:31 am to [facility] PA, who verbally acknowledged these results. Electronically Signed   By: Garald Balding M.D.   On: 08/31/2017 05:33   Ct Abdomen Pelvis W Contrast  Result Date: 08/21/2017 CLINICAL DATA:  Bloating and abdominal pain. Approximately 1 month out from robotic associated LAR for rectal cancer. Loop ileostomy. EXAM: CT ABDOMEN AND PELVIS WITH CONTRAST TECHNIQUE: Multidetector CT imaging of the abdomen and pelvis was performed using the standard protocol following bolus administration of intravenous contrast. CONTRAST:  132mL OMNIPAQUE IOHEXOL 300 MG/ML  SOLN COMPARISON:  08/08/2017 FINDINGS: Lower chest: Unremarkable. Hepatobiliary: Similar appearance 13 mm hypoattenuating lesion in the lateral segment left liver. Mild prominence of the intrahepatic bile ducts mainly in segment IV is not substantially changed. Other scattered tiny hypodensities in the right hepatic lobe are stable. There is no evidence for gallstones, gallbladder wall thickening, or pericholecystic fluid. Common duct is nondilated. Pancreas: No focal mass lesion. No dilatation of the main duct. No intraparenchymal cyst. No peripancreatic edema. Spleen: No splenomegaly. No focal mass lesion. Adrenals/Urinary Tract: No adrenal nodule or mass. Tiny hypoattenuating lesion in the interpolar right kidney is too small to characterize. Left kidney unremarkable. No evidence for hydroureter. The urinary bladder appears normal for the degree of distention. Stomach/Bowel: Stomach is mildly distended. Duodenum is nondistended. The degree of small bowel distension has progressed in the interval since  prior study. Proximal small bowel loops could be measured up to 4 cm diameter. More distal unopacified small bowel loops measure up to 4.5 cm diameter. Fecalization of small bowel contents is evident in nondistended small bowel in the deep central pelvis. Small bowel in the central pelvis appears matted together as before and a small enhancing focus (image 80/series 2) in this region of clustered small bowel is indeterminate. Distal ileum tracking into the loop ileostomy is not well demonstrated given the marked small bowel dilatation. The appendix is visualized as normal features. There is some fluid in the right colon although the colon is largely decompressed. Vascular/Lymphatic: No abdominal aortic aneurysm. No abdominal aortic atherosclerotic calcification. No gastrohepatic or hepato duodenal ligament lymphadenopathy. Small para-aortic lymph nodes are similar to prior. 10 mm short axis left para-aortic lymph node measured on the previous exam  is 9 mm today (40/2). No pelvic sidewall lymphadenopathy. Reproductive: The prostate gland and seminal vesicles have normal imaging features. Other: Rim enhancing fluid collection posterior to the distal colon near the anastomosis is not substantially changed in the interval measuring 4.3 x 2.0 cm today compared to 4.8 x 1.9 cm previously. Musculoskeletal: Bone windows reveal no worrisome lytic or sclerotic osseous lesions. IMPRESSION: 1. Some interval progression of the diffuse small bowel dilatation with distal small bowel loops now measuring up to 4.5 cm in diameter. Overall imaging features suggest obstruction although patient reportedly has high stoma output. Small bowel tapers into a matted cluster of small bowel in the central pelvis compatible with adhesions. Fecalization of small bowel contents in this region of clustered small bowel is consistent with decreased transit. 2. Although the distal ileum tracking into the ileostomy is not well visualized, there is no  evidence for dilated small bowel tracking into the stoma. Colon is largely decompressed. 3. Similar appearance of the rim enhancing fluid collection seen just posterior to the rectum. Electronically Signed   By: Misty Stanley M.D.   On: 08/21/2017 15:45   Dg Abd 2 Views  Result Date: 08/20/2017 CLINICAL DATA:  Small bowel obstruction. History of LAR and diverting ileostomy for rectal cancer. EXAM: ABDOMEN - 2 VIEW COMPARISON:  08/01/2017 abdominal radiograph. 08/08/2017 CT abdomen/pelvis. FINDINGS: Ostomy device overlies the right abdomen. There is marked diffuse small bowel dilatation up to 7.8 cm diameter, increased from 6.2 cm on 08/01/2017 radiograph and 4.4 cm on 08/08/2017 CT topogram. Fluid levels throughout the dilated small bowel loops. No evidence of pneumatosis or pneumoperitoneum. No radiopaque nephrolithiasis. Clear lung bases. IMPRESSION: Marked diffuse small bowel dilatation with air-fluid levels, worsened since 08/08/2017 CT and 08/01/2017 radiograph, most compatible with worsening distal small bowel obstruction. Electronically Signed   By: Ilona Sorrel M.D.   On: 08/20/2017 14:11   Dg Abd Acute W/chest  Result Date: 09/03/2017 CLINICAL DATA:  Abdominal pain.  Rectal carcinoma.  Loop ileostomy EXAM: DG ABDOMEN ACUTE W/ 1V CHEST COMPARISON:  Radiograph 09/02/2017, CT 08/31/2017 FINDINGS: Port in the anterior chest wall with tip in distal SVC. Lungs are clear. No free air beneath hemidiaphragms Marked decrease in small bowel distension seen on comparison exam. Previous loops of small bowel measured up to 7 cm and now are not evident. Single dilated loop of small bowel does remain in the RIGHT lower quadrant unchanged. RIGHT lower quadrant ileostomy noted. IMPRESSION: 1. Considerable improvement in small bowel obstruction pattern seen on comparison exam. 2. No intraperitoneal free air. Electronically Signed   By: Suzy Bouchard M.D.   On: 09/03/2017 10:46   Dg Abd Portable 1v  Result Date:  09/02/2017 CLINICAL DATA:  Small bowel obstruction, rectal cancer. EXAM: PORTABLE ABDOMEN - 1 VIEW COMPARISON:  CT abdomen pelvis 08/31/2017 and abdominal x-ray 08/29/2017. FINDINGS: Marked gaseous distension of small bowel, measuring up to 7.2 cm in diameter. No definite colonic gas. IMPRESSION: High-grade small bowel obstruction. Electronically Signed   By: Lorin Picket M.D.   On: 09/02/2017 07:14   Dg Abd Portable 1v  Result Date: 08/29/2017 CLINICAL DATA:  Follow-up small bowel obstruction. EXAM: PORTABLE ABDOMEN - 1 VIEW COMPARISON:  08/20/2017 FINDINGS: Continued significant dilatation of a single bowel loop centrally in the abdomen. Right lower quadrant ostomy noted. No free air organomegaly. IMPRESSION: Continued significant gaseous distention of a single bowel loop in the central abdomen, presumably small bowel. Electronically Signed   By: Rolm Baptise M.D.  On: 08/29/2017 10:09    Discharge Exam:  Vitals:   09/09/17 2113 09/10/17 0612  BP: (!) 81/56 (!) 85/55  Pulse: 100 100  Resp: 16 16  Temp: 98.5 F (36.9 C) 98.5 F (36.9 C)  SpO2: 100% 100%    General: Thin AA M who is alert.  Lungs: Clear to auscultation and symmetric breath sounds. Heart:  RRR. No murmur or rub. Abdomen: Soft. No mass. No tenderness. No hernia. Normal bowel sounds.  Wound: Ostomy in RLQ with output  Discharge Medications:   Allergies as of 09/10/2017   No Known Allergies     Medication List    TAKE these medications   ciprofloxacin 500 MG tablet Commonly known as:  CIPRO Take 1 tablet (500 mg total) by mouth 2 (two) times daily.   gabapentin 100 MG capsule Commonly known as:  NEURONTIN Take 2 capsules (200 mg total) by mouth 3 (three) times daily.   ibuprofen 200 MG tablet Commonly known as:  ADVIL,MOTRIN Take 400 mg by mouth as needed for moderate pain.   methocarbamol 500 MG tablet Commonly known as:  ROBAXIN Take 2 tablets (1,000 mg total) by mouth every 6 (six) hours as needed  for muscle spasms.   ondansetron 4 MG tablet Commonly known as:  ZOFRAN Take 1 tablet (4 mg total) by mouth every 6 (six) hours as needed for nausea.   oxyCODONE 5 MG immediate release tablet Commonly known as:  Oxy IR/ROXICODONE Take 1-2 tablets (5-10 mg total) by mouth every 4 (four) hours as needed for moderate pain, severe pain or breakthrough pain.   sodium chloride 0.9 % Inject 1,000 mLs into the vein 2 (two) times daily.       Disposition: Discharge disposition: 01-Home or Self Care       Discharge Instructions    Diet - low sodium heart healthy   Complete by:  As directed    Increase activity slowly   Complete by:  As directed       Follow-up Information    [facility]   Specialty:  Home Health Services Contact information: [address] [place] [address] [phone]        Leighton Ruff, MD. Schedule an appointment as soon as possible for a visit in 2 week(s).   Specialty:  General Surgery Contact information: [place] [place] [id number] [phone]            Signed: Alphonsa Overall, M.D., [facility] Surgery Office:  [phone]  09/10/2017, 9:16 AM

## 2017-09-11 ENCOUNTER — Emergency Department (HOSPITAL_COMMUNITY): Payer: Medicaid Other

## 2017-09-11 ENCOUNTER — Encounter (HOSPITAL_COMMUNITY): Payer: Self-pay | Admitting: Emergency Medicine

## 2017-09-11 ENCOUNTER — Observation Stay (HOSPITAL_COMMUNITY)
Admission: EM | Admit: 2017-09-11 | Discharge: 2017-09-13 | Disposition: A | Discharge status: Home / Self Care | Payer: Medicaid Other | Attending: Family Medicine | Admitting: Family Medicine

## 2017-09-11 DIAGNOSIS — Z933 Colostomy status: Secondary | ICD-10-CM | POA: Insufficient documentation

## 2017-09-11 DIAGNOSIS — E876 Hypokalemia: Secondary | ICD-10-CM | POA: Insufficient documentation

## 2017-09-11 DIAGNOSIS — C20 Malignant neoplasm of rectum: Secondary | ICD-10-CM | POA: Diagnosis present

## 2017-09-11 DIAGNOSIS — F17200 Nicotine dependence, unspecified, uncomplicated: Secondary | ICD-10-CM | POA: Insufficient documentation

## 2017-09-11 DIAGNOSIS — I9589 Other hypotension: Principal | ICD-10-CM | POA: Insufficient documentation

## 2017-09-11 DIAGNOSIS — E43 Unspecified severe protein-calorie malnutrition: Secondary | ICD-10-CM | POA: Insufficient documentation

## 2017-09-11 DIAGNOSIS — E440 Moderate protein-calorie malnutrition: Secondary | ICD-10-CM | POA: Diagnosis present

## 2017-09-11 DIAGNOSIS — R000 Tachycardia, unspecified: Secondary | ICD-10-CM

## 2017-09-11 DIAGNOSIS — R7989 Other specified abnormal findings of blood chemistry: Secondary | ICD-10-CM | POA: Diagnosis not present

## 2017-09-11 DIAGNOSIS — Z85048 Personal history of other malignant neoplasm of rectum, rectosigmoid junction, and anus: Secondary | ICD-10-CM

## 2017-09-11 DIAGNOSIS — Z79899 Other long term (current) drug therapy: Secondary | ICD-10-CM | POA: Diagnosis not present

## 2017-09-11 DIAGNOSIS — K219 Gastro-esophageal reflux disease without esophagitis: Secondary | ICD-10-CM | POA: Insufficient documentation

## 2017-09-11 DIAGNOSIS — I959 Hypotension, unspecified: Secondary | ICD-10-CM | POA: Diagnosis present

## 2017-09-11 DIAGNOSIS — Z9889 Other specified postprocedural states: Secondary | ICD-10-CM

## 2017-09-11 DIAGNOSIS — R531 Weakness: Secondary | ICD-10-CM

## 2017-09-11 DIAGNOSIS — E860 Dehydration: Secondary | ICD-10-CM | POA: Diagnosis present

## 2017-09-11 LAB — URINALYSIS, ROUTINE W REFLEX MICROSCOPIC
Bilirubin Urine: NEGATIVE
GLUCOSE, UA: NEGATIVE mg/dL
HGB URINE DIPSTICK: NEGATIVE
KETONES UR: NEGATIVE mg/dL
Leukocytes, UA: NEGATIVE
Nitrite: NEGATIVE
PROTEIN: NEGATIVE mg/dL
Specific Gravity, Urine: >1.046 — ABNORMAL HIGH (ref 1.005–1.030)
pH: 6 (ref 5.0–8.0)

## 2017-09-11 LAB — I-STAT CHEM 8, ED
BUN: 14 mg/dL (ref 6–20)
CREATININE: 0.9 mg/dL (ref 0.61–1.24)
Calcium, Ion: 1.1 mmol/L — ABNORMAL LOW (ref 1.15–1.40)
Chloride: 92 mmol/L — ABNORMAL LOW (ref 101–111)
Glucose, Bld: 131 mg/dL — ABNORMAL HIGH (ref 65–99)
HCT: 45 % (ref 39.0–52.0)
Hemoglobin: 15.3 g/dL (ref 13.0–17.0)
Potassium: 3.6 mmol/L (ref 3.5–5.1)
Sodium: 134 mmol/L — ABNORMAL LOW (ref 135–145)
TCO2: 27 mmol/L (ref 22–32)

## 2017-09-11 LAB — BASIC METABOLIC PANEL
ANION GAP: 15 (ref 5–15)
BUN: 15 mg/dL (ref 6–20)
CHLORIDE: 94 mmol/L — AB (ref 101–111)
CO2: 23 mmol/L (ref 22–32)
Calcium: 9.2 mg/dL (ref 8.9–10.3)
Creatinine, Ser: 0.95 mg/dL (ref 0.61–1.24)
GFR calc Af Amer: >60 mL/min (ref >60)
GLUCOSE: 118 mg/dL — AB (ref 65–99)
POTASSIUM: 4.9 mmol/L (ref 3.5–5.1)
Sodium: 132 mmol/L — ABNORMAL LOW (ref 135–145)

## 2017-09-11 LAB — HEPATIC FUNCTION PANEL
ALBUMIN: 3.7 g/dL (ref 3.5–5.0)
ALK PHOS: 199 U/L — AB (ref 38–126)
ALT: 168 U/L — AB (ref 17–63)
AST: 83 U/L — ABNORMAL HIGH (ref 15–41)
Bilirubin, Direct: 0.4 mg/dL (ref 0.1–0.5)
Indirect Bilirubin: 1 mg/dL — ABNORMAL HIGH (ref 0.3–0.9)
Total Bilirubin: 1.4 mg/dL — ABNORMAL HIGH (ref 0.3–1.2)
Total Protein: 8.2 g/dL — ABNORMAL HIGH (ref 6.5–8.1)

## 2017-09-11 LAB — CBC
HEMATOCRIT: 39.6 % (ref 39.0–52.0)
HEMOGLOBIN: 13.8 g/dL (ref 13.0–17.0)
MCH: 29.6 pg (ref 26.0–34.0)
MCHC: 34.8 g/dL (ref 30.0–36.0)
MCV: 84.8 fL (ref 78.0–100.0)
PLATELETS: 624 10*3/uL — AB (ref 150–400)
RBC: 4.67 MIL/uL (ref 4.22–5.81)
RDW: 14.3 % (ref 11.5–15.5)
WBC: 8.7 10*3/uL (ref 4.0–10.5)

## 2017-09-11 LAB — I-STAT TROPONIN, ED: Troponin i, poc: 0 ng/mL (ref 0.00–0.08)

## 2017-09-11 LAB — I-STAT CG4 LACTIC ACID, ED: Lactic Acid, Venous: 1.45 mmol/L (ref 0.5–1.9)

## 2017-09-11 LAB — MAGNESIUM: Magnesium: 1.6 mg/dL — ABNORMAL LOW (ref 1.7–2.4)

## 2017-09-11 LAB — LIPASE, BLOOD: Lipase: 32 U/L (ref 11–51)

## 2017-09-11 MED ORDER — SODIUM CHLORIDE 0.9 % IV BOLUS
1000.0000 mL | Freq: Once | INTRAVENOUS | Status: AC
  Administered 2017-09-11: 1000 mL via INTRAVENOUS

## 2017-09-11 MED ORDER — SODIUM CHLORIDE 0.9 % IV BOLUS
500.0000 mL | Freq: Once | INTRAVENOUS | Status: AC
  Administered 2017-09-11: 500 mL via INTRAVENOUS

## 2017-09-11 MED ORDER — FENTANYL CITRATE (PF) 100 MCG/2ML IJ SOLN
25.0000 ug | Freq: Once | INTRAMUSCULAR | Status: AC
  Administered 2017-09-11: 25 ug via INTRAVENOUS
  Filled 2017-09-11: qty 2

## 2017-09-11 MED ORDER — IOPAMIDOL (ISOVUE-370) INJECTION 76%
INTRAVENOUS | Status: AC
  Filled 2017-09-11: qty 100

## 2017-09-11 MED ORDER — KETOROLAC TROMETHAMINE 30 MG/ML IJ SOLN: 30.0000 mg | Freq: Once | INTRAMUSCULAR | Status: DC

## 2017-09-11 MED ORDER — ONDANSETRON HCL 4 MG/2ML IJ SOLN
4.0000 mg | Freq: Once | INTRAMUSCULAR | Status: AC
  Administered 2017-09-11: 4 mg via INTRAVENOUS
  Filled 2017-09-11: qty 2

## 2017-09-11 MED ORDER — FENTANYL CITRATE (PF) 100 MCG/2ML IJ SOLN
25.0000 ug | INTRAMUSCULAR | Status: DC | PRN
  Administered 2017-09-12 (×2): 25 ug via INTRAVENOUS
  Filled 2017-09-11 (×2): qty 2

## 2017-09-11 MED ORDER — IOPAMIDOL (ISOVUE-370) INJECTION 76%
100.0000 mL | Freq: Once | INTRAVENOUS | Status: AC | PRN
  Administered 2017-09-11: 80 mL via INTRAVENOUS

## 2017-09-11 MED ORDER — IOHEXOL 300 MG/ML  SOLN
30.0000 mL | Freq: Once | INTRAMUSCULAR | Status: AC | PRN
  Administered 2017-09-11: 30 mL via ORAL

## 2017-09-11 NOTE — ED Notes (Signed)
Bed: KS08 Expected date:  Expected time:  Means of arrival:  Comments: Per policy 60 minute hold. Room will open at 1700

## 2017-09-11 NOTE — ED Notes (Signed)
Pt. Made aware for the need of urine specimen. 

## 2017-09-11 NOTE — ED Triage Notes (Signed)
Patient here from home with complaints of weakness. Patient hypotensive in triage, tachy. Discharge from hospital yesterday. Cancer patient .

## 2017-09-11 NOTE — ED Provider Notes (Signed)
[facility] DEPT Provider Note   CSN: [id number] Arrival date & time: 09/11/17  1648     History   Chief Complaint Chief Complaint  Patient presents with  . Weakness    HPI Jacob Galvan. is a 42 y.o. male with a history of rectal adenocarcinoma status post resection, presence of ileostomy (Dr. Marcello Moores - 07/20/17), protein calorie malnutrition, recent Port-A-Cath removal (5/21), who was discharged home from the hospital yesterday presents emergency department today for generalized weakness and shortness of breath.  Patient states that he was discharged home history in good condition.  After he arrived home he states that he started feeling increasing decline in energy.  He notes that he has generalized weakness.  He reports that whenever he ambulates he becomes very short of breath and feels like he is going to pass out.  He denies any syncopal episodes.  No focal weakness, numbness/tingling, headache, visual changes, vertigo.  Patient reports that he is now short of breath at rest today.  He states this is a new issue.  No associated chest pain, cough, hemoptysis, lower leg swelling.  He reports he has liquid output from his colostomy bag but has not changed since discharge.  He reports increasing generalized abdominal pain without any focal consolidation of pain.  Nothing makes this better or worse.  He has not taken anything for symptoms.  He did not pick up his ciprofloxacin from the pharmacy as he was supposed to.  He denies any fever, chills, chest pain, cough, sputum production, hemoptysis, nausea/vomiting, or urinary symptoms. Patients mother is at bedside and helped provide the history.   HPI  Past Medical History:  Diagnosis Date  . Headache   . Rectal cancer ([place]) 2018   radiation and Chemo    Patient Active Problem List   Diagnosis Date Noted  . Dehydration 09/01/2017  . SBO (small bowel obstruction) ([place]) 08/31/2017  . Ileostomy in place [facility])  07/30/2017  . Ileus, postoperative ([place]) 07/30/2017  . Hypokalemia 07/30/2017  . Protein-calorie malnutrition, severe 07/29/2017  . Genetic testing 04/17/2017  . Port-A-Cath in place 04/17/2017  . Family history of colon cancer 03/29/2017  . Family history of melanoma 03/29/2017  . Family history of pancreatic cancer 03/29/2017  . Family history of testicular cancer 03/29/2017  . 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 COLONOSCOPY    . LAPAROSCOPIC DIVERTED COLOSTOMY N/A 07/20/2017   Procedure: DIVERTING LOOP COLOSTOMY;  Surgeon: Leighton Ruff, MD;  Location: [facility] ORS;  Service: General;  Laterality: N/A;  . PORT-A-CATH REMOVAL N/A 09/05/2017   Procedure: REMOVAL PORT-A-CATH;  Surgeon: Leighton Ruff, MD;  Location: [facility] ORS;  Service: General;  Laterality: N/A;  . PORTA CATH INSERTION    . WISDOM TOOTH EXTRACTION    . XI ROBOTIC ASSISTED LOWER ANTERIOR RESECTION N/A 07/20/2017   Procedure: XI ROBOTIC ASSISTED LOWER ANTERIOR RESECTION ERAS PATHWAY;  Surgeon: Leighton Ruff, MD;  Location: [facility] ORS;  Service: General;  Laterality: N/A;        Home Medications    Prior to Admission medications   Medication Sig Start Date End Date Taking? Authorizing Provider  ciprofloxacin (CIPRO) 500 MG tablet Take 1 tablet (500 mg total) by mouth 2 (two) times daily. 09/10/17   Alphonsa Overall, MD  gabapentin (NEURONTIN) 100 MG capsule Take 2 capsules (200 mg total) by mouth 3 (three) times daily. 8/34/19   Leighton Ruff, MD  ibuprofen (ADVIL,MOTRIN) 200 MG tablet Take 400 mg by mouth as needed for moderate pain.     [provider]  methocarbamol (ROBAXIN) 500 MG tablet Take 2 tablets (1,000 mg total) by mouth every 6 (six) hours as needed for muscle spasms. 5/62/13   Leighton Ruff, MD  ondansetron (ZOFRAN) 4 MG tablet Take 1 tablet (4 mg total) by mouth every 6 (six) hours as needed for nausea. 0/86/57   Leighton Ruff, MD  oxyCODONE (OXY IR/ROXICODONE) 5 MG immediate release tablet Take 1-2 tablets (5-10 mg total) by mouth every 4 (four) hours as needed for moderate pain, severe pain or breakthrough pain. 8/46/96   Leighton Ruff, MD  sodium chloride 0.9 % Inject 1,000 mLs into the vein 2 (two) times daily. 2/95/28   Leighton Ruff, MD    Family History Family History  Problem Relation Age of Onset  . Diabetes Other   . Diabetes Maternal Grandmother   . Colon cancer Paternal Grandfather   . Pancreatic cancer Paternal Grandfather 49  . Breast cancer Paternal Aunt 46  . Melanoma Sister 12       x2  . Testicular cancer Maternal Grandfather 79  . COPD Paternal Grandmother   . Colon cancer Paternal Uncle 58  . Pancreatic cancer Paternal Uncle   . Testicular cancer Other   . Heart Problems Son 5    Social History Social History   Tobacco Use  . Smoking status: Current Some Day Smoker  . Smokeless tobacco: Never Used  Substance Use Topics  . Alcohol use: Yes    Comment: occ  . Drug use: Yes    Types: Marijuana    Comment: daily-last time was a week ago from 07/18/2017     Allergies   Patient has no known allergies.   Review of Systems Review of Systems  All other systems reviewed and are negative.    Physical Exam Updated Vital Signs BP (!) 86/61 (BP Location: Left Arm)   Pulse (!) 140   Temp 98.2 F (36.8 C) (Oral)   Resp 18   SpO2 100%   Physical Exam  Constitutional: He appears well-developed.  Frail appearing male   HENT:  Head: Normocephalic and atraumatic.  Right Ear: External ear normal.  Left Ear: External ear normal.  Nose: Nose normal.  Mouth/Throat: Uvula is midline, oropharynx is clear and moist and mucous membranes are normal. No tonsillar exudate.  Eyes: Pupils are equal, round, and reactive to light. Right eye exhibits no discharge. Left eye exhibits no discharge. No scleral icterus.  Neck: Trachea normal. Neck supple. No spinous process tenderness  present. No neck rigidity. Normal range of motion present.  No nuchal rigidity or meningismus  Cardiovascular: Regular rhythm and intact distal pulses. Tachycardia present.  No murmur heard. Pulses:      Radial pulses are 2+ on the right side, and 2+ on the left side.       Dorsalis pedis pulses are 2+ on the right side, and 2+ on the left side.       Posterior tibial pulses are 2+ on the right side, and 2+ on the left side.  No lower extremity swelling or edema. Calves symmetric in size bilaterally.  Pulmonary/Chest: Effort normal and breath sounds normal. He exhibits no tenderness.  Patient satting 100% on room air with good waveform on the monitor.  No signs of respiratory distress.  No tachypnea or accessory muscle use.  No pursed lip breathing.  Lungs clear to auscultation bilaterally.  Normal effort.  Abdominal: Soft. Bowel sounds are normal. There is generalized tenderness. There is no rigidity, no rebound, no guarding and no CVA tenderness.  Musculoskeletal: He exhibits no edema.  Lymphadenopathy:    He has no cervical adenopathy.  Neurological: He is alert.  Moves all 4 extremities without evidence of ataxia.   Skin: Skin is warm and dry. No rash noted. He is not diaphoretic.  Psychiatric: He has a normal mood and affect.  Nursing note and vitals reviewed.    ED Treatments / Results  Labs (all labs ordered are listed, but only abnormal results are displayed) Labs Reviewed  BASIC METABOLIC PANEL - Abnormal; Notable for the following components:      Result Value   Sodium 132 (*)    Chloride 94 (*)    Glucose, Bld 118 (*)    All other components within normal limits  CBC - Abnormal; Notable for the following components:   Platelets 624 (*)    All other components within normal limits  URINALYSIS, ROUTINE W REFLEX MICROSCOPIC - Abnormal; Notable for the following components:   Specific Gravity, Urine >1.046 (*)    All other components within normal limits  MAGNESIUM -  Abnormal; Notable for the following components:   Magnesium 1.6 (*)    All other components within normal limits  HEPATIC FUNCTION PANEL - Abnormal; Notable for the following components:   Total Protein 8.2 (*)    AST 83 (*)    ALT 168 (*)    Alkaline Phosphatase 199 (*)    Total Bilirubin 1.4 (*)    Indirect Bilirubin 1.0 (*)    All other components within normal limits  I-STAT CHEM 8, ED - Abnormal; Notable for the following components:   Sodium 134 (*)    Chloride 92 (*)    Glucose, Bld 131 (*)    Calcium, Ion 1.10 (*)    All other components within normal limits  CULTURE, BLOOD (ROUTINE X 2)  CULTURE, BLOOD (ROUTINE X 2)  LIPASE, BLOOD  CBG MONITORING, ED  I-STAT CG4 LACTIC ACID, ED  I-STAT TROPONIN, ED  I-STAT CG4 LACTIC ACID, ED    EKG EKG Interpretation  Date/Time:  Monday Sep 11 2017 16:56:58 EDT Ventricular Rate:  126 PR Interval:    QRS Duration: 71 QT Interval:  313 QTC Calculation: 454 R Axis:   121 Text Interpretation:  Sinus tachycardia Paired ventricular premature complexes Aberrant complex Left posterior fascicular block Borderline T abnormalities, anterior leads No prior ECG for comparison.  No STEMI Confirmed by Antony Blackbird [phone]) on 09/11/2017 5:46:13 PM   Radiology Dg Chest 2 View  Result Date: 09/11/2017 CLINICAL DATA:  Shortness of breath.  Tachycardia.  Weakness. EXAM: CHEST - 2 VIEW COMPARISON:  09/03/2017. FINDINGS: Normal sized heart. Clear lungs. The right jugular porta catheter has been removed. Stable exostosis arising from the medial aspect of the proximal humeral shaft. IMPRESSION: No acute abnormality. Electronically Signed   By: Claudie Revering M.D.   On: 09/11/2017 18:09   Ct Angio Chest Pe W/cm &/or Wo Cm  Result Date: 09/11/2017 CLINICAL DATA:  Acute generalized abdominal pain. History of rectal cancer. EXAM: CT ANGIOGRAPHY CHEST CT ABDOMEN AND PELVIS WITH CONTRAST TECHNIQUE: Multidetector CT imaging of the chest was performed using  the standard protocol during bolus administration of intravenous contrast. Multiplanar CT image reconstructions and MIPs were obtained to evaluate the vascular anatomy. Multidetector CT imaging of the abdomen and pelvis was performed using the standard protocol during  bolus administration of intravenous contrast. CONTRAST:  17mL OMNIPAQUE IOHEXOL 300 MG/ML SOLN, 4mL ISOVUE-370 IOPAMIDOL (ISOVUE-370) INJECTION 76% COMPARISON:  CT scan of Aug 31, 2017. FINDINGS: CTA CHEST FINDINGS Cardiovascular: Satisfactory opacification of the pulmonary arteries to the segmental level. No evidence of pulmonary embolism. Normal heart size. No pericardial effusion. Mediastinum/Nodes: No enlarged mediastinal, hilar, or axillary lymph nodes. Thyroid gland, trachea, and esophagus demonstrate no significant findings. Lungs/Pleura: Lungs are clear. No pleural effusion or pneumothorax. Musculoskeletal: No chest wall abnormality. No acute or significant osseous findings. Review of the MIP images confirms the above findings. CT ABDOMEN and PELVIS FINDINGS Hepatobiliary: No gallstones are noted. Stable left hepatic low density. No biliary dilatation is noted. Pancreas: Unremarkable. No pancreatic ductal dilatation or surrounding inflammatory changes. Spleen: Normal in size without focal abnormality. Adrenals/Urinary Tract: Adrenal glands are unremarkable. Kidneys are normal, without renal calculi, focal lesion, or hydronephrosis. Bladder is unremarkable. Stomach/Bowel: Stomach appears normal. The appendix appears normal. 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 Postsurgical changes are noted in the rectal region. No definite bowel dilatation is noted. Vascular/Lymphatic: No significant vascular findings are present. No enlarged abdominal or pelvic lymph nodes. Reproductive: Prostate is not well visualized. Other: No abdominal wall hernia or abnormality. No abdominopelvic ascites. Musculoskeletal: No acute or significant osseous findings.  Review of the MIP images confirms the above findings. IMPRESSION: No definite evidence of pulmonary embolus. Postsurgical changes are noted in the rectal region. 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 No abnormal bowel dilatation is noted. Electronically Signed   By: Marijo Conception, M.D.   On: 09/11/2017 20:58   Ct Abdomen Pelvis W Contrast  Result Date: 09/11/2017 CLINICAL DATA:  Acute generalized abdominal pain. History of rectal cancer. EXAM: CT ANGIOGRAPHY CHEST CT ABDOMEN AND PELVIS WITH CONTRAST TECHNIQUE: Multidetector CT imaging of the chest was performed using the standard protocol during bolus administration of intravenous contrast. Multiplanar CT image reconstructions and MIPs were obtained to evaluate the vascular anatomy. Multidetector CT imaging of the abdomen and pelvis was performed using the standard protocol during bolus administration of intravenous contrast. CONTRAST:  80mL OMNIPAQUE IOHEXOL 300 MG/ML SOLN, 34mL ISOVUE-370 IOPAMIDOL (ISOVUE-370) INJECTION 76% COMPARISON:  CT scan of Aug 31, 2017. FINDINGS: CTA CHEST FINDINGS Cardiovascular: Satisfactory opacification of the pulmonary arteries to the segmental level. No evidence of pulmonary embolism. Normal heart size. No pericardial effusion. Mediastinum/Nodes: No enlarged mediastinal, hilar, or axillary lymph nodes. Thyroid gland, trachea, and esophagus demonstrate no significant findings. Lungs/Pleura: Lungs are clear. No pleural effusion or pneumothorax. Musculoskeletal: No chest wall abnormality. No acute or significant osseous findings. Review of the MIP images confirms the above findings. CT ABDOMEN and PELVIS FINDINGS Hepatobiliary: No gallstones are noted. Stable left hepatic low density. No biliary dilatation is noted. Pancreas: Unremarkable. No pancreatic ductal dilatation or surrounding inflammatory changes. Spleen: Normal in size without focal abnormality. Adrenals/Urinary Tract: Adrenal glands are unremarkable. Kidneys are  normal, without renal calculi, focal lesion, or hydronephrosis. Bladder is unremarkable. Stomach/Bowel: Stomach appears normal. The appendix appears normal. 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 Postsurgical changes are noted in the rectal region. No definite bowel dilatation is noted. Vascular/Lymphatic: No significant vascular findings are present. No enlarged abdominal or pelvic lymph nodes. Reproductive: Prostate is not well visualized. Other: No abdominal wall hernia or abnormality. No abdominopelvic ascites. Musculoskeletal: No acute or significant osseous findings. Review of the MIP images confirms the above findings. IMPRESSION: No definite evidence of pulmonary embolus. Postsurgical changes are noted in the rectal  region. 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 No abnormal bowel dilatation is noted. Electronically Signed   By: Marijo Conception, M.D.   On: 09/11/2017 20:58    Procedures Procedures (including critical care time) CRITICAL CARE Performed by: Jillyn Ledger   Total critical care time: 45 minutes   Critical care time was exclusive of separately billable procedures and treating other patients.  Critical care was necessary to treat or prevent imminent or life-threatening deterioration.  Critical care was time spent personally by me on the following activities: development of treatment plan with patient and/or surrogate as well as nursing, discussions with consultants, evaluation of patient's response to treatment, examination of patient, obtaining history from patient or surrogate, ordering and performing treatments and interventions, ordering and review of laboratory studies, ordering and review of radiographic studies, pulse oximetry and re-evaluation of patient's condition.  Medications Ordered in ED Medications  iopamidol (ISOVUE-370) 76 % injection (has no administration in time range)  sodium chloride 0.9 % bolus 1,000 mL (0 mLs Intravenous Stopped 09/11/17 1854)   sodium chloride 0.9 % bolus 1,000 mL (0 mLs Intravenous Stopped 09/11/17 1854)  fentaNYL (SUBLIMAZE) injection 25 mcg (25 mcg Intravenous Given 09/11/17 1908)  fentaNYL (SUBLIMAZE) injection 25 mcg (25 mcg Intravenous Given 09/11/17 2037)  ondansetron (ZOFRAN) injection 4 mg (4 mg Intravenous Given 09/11/17 2037)  sodium chloride 0.9 % bolus 500 mL (500 mLs Intravenous New Bag/Given 09/11/17 2037)  iopamidol (ISOVUE-370) 76 % injection 100 mL (80 mLs Intravenous Contrast Given 09/11/17 2017)  iohexol (OMNIPAQUE) 300 MG/ML solution 30 mL (30 mLs Oral Contrast Given 09/11/17 1946)  fentaNYL (SUBLIMAZE) injection 25 mcg (25 mcg Intravenous Given 09/11/17 2234)     Initial Impression / Assessment and Plan / ED Course  I have reviewed the triage vital signs and the nursing notes.  Pertinent labs & imaging results that were available during my care of the patient were reviewed by me and considered in my medical decision making (see chart for details).     42 y.o. male with a history of rectal adenocarcinoma status post resection, presence of ileostomy, protein calorie malnutrition, recent Port-A-Cath removal (5/21), who was discharged home from the hospital yesterday (after admission for SBO and dehydration from 5/17-5/26) who for generalized weakness and shortness of breath.  On arrival the patient is with tachycardia of 140 as well as hypertension of 86/61.  He is without hypoxia, tachypnea or fever.  I have no clear source for sepsis at this time.  Will obtain lactic acid and blood cultures as well as basic blood work, UA and CXR. Patient is now short of breath with a history of cancer and recent surgery.  Will obtain CTA to rule out PE and also evaluate for PNA and other cardiopulmonary causes of SOB. Will obtain EKG and Tn. No reported chest pain at this time. Patient with good output in colostomy bag. He has generalized TTP without focal ttp. Will order CT abdomen and pelvis, CBC, CMP, Lipase. Will start  patient on 2L fluid. Will hold pain medication given hypotension.   Lab work and imaging otherwise unremarkable.  Patient's lactic acid is 1.45.  Do not suspect sepsis.  Blood cultures pending.  UA without evidence of UTI.  Patient has mild electrolyte derangements of magnesium at 1.6 and sodium at 132.  No evidence of DKA.  Kidney function within normal limits.  No acute kidney injury.  No leukocytosis.  No anemia.  Lipase  within normal limits.  Troponin  within normal limits.  EKG was sinus tachycardia.  Chest x-ray without evidence of pneumonia.  CT scans without evidence of PE, pneumonia, obstruction or other cardiothoracic or intra-abdominal pathology.  There is postsurgical changes that are noted in the rectal region and colostomy is positioned.   Patient blood pressure slowly improving.  He was given another 500 cc of fluid.  Small dose of pain medication were administered to ensure blood pressure did not drop rapidly.  His heart rate has now improved and is in the low 100s.  His pressures have been maintained in the low to upper 53Z systolic.  Possible episode of dehydration.  Spoke with Dr. Lucia Gaskins of general surgery. He states that surgery will follow in the morning. Asked to admit to the hospitals service.   Current Vitals: Blood pressure 91/62, pulse (!) 102, temperature 98.6 F (37 C), temperature source Oral, resp. rate 15, SpO2 99 %.  I appreciate Dr. Hal Hope for brining the patient into the hospital. He appears safe for admission.  Family and patient in agreement with plan.  Patient case discussed with Dr. Sherry Ruffing who is in agreement with plan.   Final Clinical Impressions(s) / ED Diagnoses   Final diagnoses:  Generalized weakness  Dehydration  Other specified hypotension  History of rectal cancer  History of ileostomy  Tachycardia    ED Discharge Orders    None       Lorelle Gibbs 09/11/17 2355    Tegeler, Gwenyth Allegra, MD 09/12/17 [phone]

## 2017-09-12 ENCOUNTER — Other Ambulatory Visit: Payer: Self-pay

## 2017-09-12 ENCOUNTER — Observation Stay: Payer: Self-pay

## 2017-09-12 DIAGNOSIS — E860 Dehydration: Secondary | ICD-10-CM | POA: Diagnosis not present

## 2017-09-12 DIAGNOSIS — I9589 Other hypotension: Secondary | ICD-10-CM | POA: Diagnosis not present

## 2017-09-12 LAB — CBC
HEMATOCRIT: 34.3 % — AB (ref 39.0–52.0)
HEMOGLOBIN: 11.5 g/dL — AB (ref 13.0–17.0)
MCH: 28.7 pg (ref 26.0–34.0)
MCHC: 33.5 g/dL (ref 30.0–36.0)
MCV: 85.5 fL (ref 78.0–100.0)
Platelets: 536 10*3/uL — ABNORMAL HIGH (ref 150–400)
RBC: 4.01 MIL/uL — AB (ref 4.22–5.81)
RDW: 14.5 % (ref 11.5–15.5)
WBC: 7.3 10*3/uL (ref 4.0–10.5)

## 2017-09-12 LAB — BASIC METABOLIC PANEL
ANION GAP: 9 (ref 5–15)
BUN: 9 mg/dL (ref 6–20)
CO2: 25 mmol/L (ref 22–32)
Calcium: 8.3 mg/dL — ABNORMAL LOW (ref 8.9–10.3)
Chloride: 101 mmol/L (ref 101–111)
Creatinine, Ser: 0.81 mg/dL (ref 0.61–1.24)
GLUCOSE: 106 mg/dL — AB (ref 65–99)
POTASSIUM: 3.6 mmol/L (ref 3.5–5.1)
Sodium: 135 mmol/L (ref 135–145)

## 2017-09-12 LAB — ACETAMINOPHEN LEVEL

## 2017-09-12 LAB — MAGNESIUM: MAGNESIUM: 2 mg/dL (ref 1.7–2.4)

## 2017-09-12 LAB — HEPATIC FUNCTION PANEL
ALBUMIN: 3 g/dL — AB (ref 3.5–5.0)
ALK PHOS: 147 U/L — AB (ref 38–126)
ALT: 130 U/L — AB (ref 17–63)
AST: 53 U/L — AB (ref 15–41)
BILIRUBIN TOTAL: 0.9 mg/dL (ref 0.3–1.2)
Bilirubin, Direct: 0.2 mg/dL (ref 0.1–0.5)
Indirect Bilirubin: 0.7 mg/dL (ref 0.3–0.9)
TOTAL PROTEIN: 6.5 g/dL (ref 6.5–8.1)

## 2017-09-12 LAB — CORTISOL: Cortisol, Plasma: 6.7 ug/dL

## 2017-09-12 LAB — TROPONIN I: Troponin I: <0.03 ng/mL (ref <0.03)

## 2017-09-12 MED ORDER — MORPHINE SULFATE (PF) 2 MG/ML IV SOLN
2.0000 mg | INTRAVENOUS | Status: DC | PRN
  Administered 2017-09-12 – 2017-09-13 (×2): 4 mg via INTRAVENOUS
  Filled 2017-09-12 (×2): qty 2

## 2017-09-12 MED ORDER — UNJURY CHICKEN SOUP POWDER
8.0000 [oz_av] | Freq: Every day | ORAL | Status: DC
  Filled 2017-09-12 (×2): qty 27

## 2017-09-12 MED ORDER — GABAPENTIN 300 MG PO CAPS
300.0000 mg | ORAL_CAPSULE | Freq: Three times a day (TID) | ORAL | Status: DC
  Administered 2017-09-12 – 2017-09-13 (×4): 300 mg via ORAL
  Filled 2017-09-12 (×4): qty 1

## 2017-09-12 MED ORDER — MAGNESIUM SULFATE 2 GM/50ML IV SOLN
2.0000 g | Freq: Once | INTRAVENOUS | Status: AC
  Administered 2017-09-12: 2 g via INTRAVENOUS
  Filled 2017-09-12 (×2): qty 50

## 2017-09-12 MED ORDER — SODIUM CHLORIDE 0.9 % IV SOLN
INTRAVENOUS | Status: AC
  Administered 2017-09-12: 1000 mL via INTRAVENOUS
  Administered 2017-09-12: 02:00:00 via INTRAVENOUS

## 2017-09-12 MED ORDER — MORPHINE SULFATE (PF) 4 MG/ML IV SOLN
4.0000 mg | Freq: Once | INTRAVENOUS | Status: AC
  Administered 2017-09-12: 4 mg via INTRAVENOUS
  Filled 2017-09-12: qty 1

## 2017-09-12 MED ORDER — ONDANSETRON HCL 4 MG PO TABS: 4.0000 mg | ORAL_TABLET | Freq: Four times a day (QID) | ORAL | Status: DC | PRN

## 2017-09-12 MED ORDER — LIDOCAINE 5 % EX PTCH
1.0000 | MEDICATED_PATCH | CUTANEOUS | Status: DC
  Administered 2017-09-12 – 2017-09-13 (×2): 1 via TRANSDERMAL
  Filled 2017-09-12 (×2): qty 1

## 2017-09-12 MED ORDER — PANTOPRAZOLE SODIUM 40 MG PO TBEC
40.0000 mg | DELAYED_RELEASE_TABLET | Freq: Two times a day (BID) | ORAL | Status: DC
  Administered 2017-09-12 – 2017-09-13 (×3): 40 mg via ORAL
  Filled 2017-09-12 (×3): qty 1

## 2017-09-12 MED ORDER — COSYNTROPIN 0.25 MG IJ SOLR
0.2500 mg | Freq: Once | INTRAMUSCULAR | Status: AC
  Administered 2017-09-13: 0.25 mg via INTRAVENOUS
  Filled 2017-09-12: qty 0.25

## 2017-09-12 MED ORDER — BOOST / RESOURCE BREEZE PO LIQD CUSTOM
1.0000 | Freq: Two times a day (BID) | ORAL | Status: DC
  Administered 2017-09-12 – 2017-09-13 (×2): 1 via ORAL

## 2017-09-12 MED ORDER — ENOXAPARIN SODIUM 40 MG/0.4ML ~~LOC~~ SOLN
40.0000 mg | SUBCUTANEOUS | Status: DC
  Filled 2017-09-12: qty 0.4

## 2017-09-12 MED ORDER — METHOCARBAMOL 500 MG PO TABS
500.0000 mg | ORAL_TABLET | Freq: Three times a day (TID) | ORAL | Status: DC
  Administered 2017-09-12 – 2017-09-13 (×4): 500 mg via ORAL
  Filled 2017-09-12 (×4): qty 1

## 2017-09-12 MED ORDER — METHOCARBAMOL 500 MG PO TABS: 1000.0000 mg | ORAL_TABLET | Freq: Four times a day (QID) | ORAL | Status: DC | PRN

## 2017-09-12 MED ORDER — OXYCODONE-ACETAMINOPHEN 5-325 MG PO TABS
1.0000 | ORAL_TABLET | ORAL | Status: DC | PRN
  Administered 2017-09-12 – 2017-09-13 (×4): 1 via ORAL
  Filled 2017-09-12 (×4): qty 1

## 2017-09-12 MED ORDER — OXYCODONE HCL 5 MG PO TABS
5.0000 mg | ORAL_TABLET | ORAL | Status: DC | PRN
  Administered 2017-09-12: 5 mg via ORAL
  Filled 2017-09-12: qty 1

## 2017-09-12 MED ORDER — CIPROFLOXACIN HCL 500 MG PO TABS
500.0000 mg | ORAL_TABLET | Freq: Two times a day (BID) | ORAL | Status: DC
  Administered 2017-09-12 – 2017-09-13 (×3): 500 mg via ORAL
  Filled 2017-09-12 (×3): qty 1

## 2017-09-12 MED ORDER — ONDANSETRON HCL 4 MG/2ML IJ SOLN
4.0000 mg | Freq: Four times a day (QID) | INTRAMUSCULAR | Status: DC | PRN
  Administered 2017-09-12 (×2): 4 mg via INTRAVENOUS
  Filled 2017-09-12 (×2): qty 2

## 2017-09-12 MED FILL — CIPROFLOXACIN HCL 500 MG TA: 500 | 5 days supply | Qty: 10 | Fill #0

## 2017-09-12 NOTE — Progress Notes (Signed)
Peripherally Inserted Central Catheter/Midline Placement  The IV Nurse has discussed with the patient and/or persons authorized to consent for the patient, the purpose of this procedure and the potential benefits and risks involved with this procedure.  The benefits include less needle sticks, lab draws from the catheter, and the patient may be discharged home with the catheter. Risks include, but not limited to, infection, bleeding, blood clot (thrombus formation), and puncture of an artery; nerve damage and irregular heartbeat and possibility to perform a PICC exchange if needed/ordered by physician.  Alternatives to this procedure were also discussed.  [facility] [employer] PICC patient education guide, fact sheet on infection prevention and patient information card has been provided to patient /or left at bedside.    PICC/Midline Placement Documentation  PICC Single Lumen 09/12/17 PICC Right Brachial 39 cm 0 cm (Active)  Indication for Insertion or Continuance of Line Home intravenous therapies (PICC only) 09/12/2017  3:39 PM  Exposed Catheter (cm) 0 cm 09/12/2017  3:39 PM  Site Assessment Clean;Dry;Intact 09/12/2017  3:39 PM  Line Status Flushed;Saline locked;Blood return noted 09/12/2017  3:39 PM  Dressing Type Transparent 09/12/2017  3:39 PM  Dressing Status Clean;Dry;Intact;Antimicrobial disc in place 09/12/2017  3:39 PM  Dressing Change Due 09/19/17 09/12/2017  3:39 PM       Gordan Payment 09/12/2017, 3:40 PM

## 2017-09-12 NOTE — Progress Notes (Signed)
Initial Nutrition Assessment  DOCUMENTATION CODES:   Severe malnutrition in context of chronic illness  INTERVENTION:   -Provide Boost Breeze po BID, each supplement provides 250 kcal and 9 grams of protein -Provide Unjury chicken Soup daily, Each serving provides 100kcal and 21g protein  -Provide daily snack  NUTRITION DIAGNOSIS:   Severe Malnutrition related to chronic illness, cancer and cancer related treatments as evidenced by severe fat depletion, severe muscle depletion.  GOAL:   Patient will meet greater than or equal to 90% of their needs  MONITOR:   PO intake, Supplement acceptance, Labs, Weight trends, I & O's  REASON FOR ASSESSMENT:   Malnutrition Screening Tool    ASSESSMENT:    42 y.o. 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   Patient reports not eating well during his previous admission. Pt was just recently discharged from that admission on 5/26. Pt states he tried some spaghetti and cereal at home and tolerated this. He has high output from his ileostomy and reports a lot of gas.  Patient blames his poor intakes on the quality of the hospital food he is served. Pt states he is lactose intolerant so uses Lactaid at home. He is willing to try soy and almond milk as alternatives as well to dairy.  Pt willing to try protein supplements that are not Ensure. States he does not tolerate this supplement well anymore. RD will trial Boost Breeze and Unjury supplements. RD will also provide daily snack of fruit and cottage cheese.   Offered to provide ileostomy diet education. Pt declined formal education as he is to have his ileostomy removed in ~2 weeks. He is willing to accept handout to review for the time being, however.   Labs reviewed. Medications: Zofran  PRN   NUTRITION - FOCUSED PHYSICAL EXAM:    Most Recent Value  Orbital Region  Moderate depletion  Upper Arm Region  Severe depletion  Thoracic and Lumbar Region  Unable to assess  Buccal Region  Moderate depletion  Temple Region  Mild depletion  Clavicle Bone Region  Moderate depletion  Clavicle and Acromion Bone Region  Moderate depletion  Scapular Bone Region  Unable to assess  Dorsal Hand  Moderate depletion  Patellar Region  Severe depletion  Anterior Thigh Region  Severe depletion  Posterior Calf Region  Severe depletion  Edema (RD Assessment)  None       Diet Order:   Diet Order           Diet regular Room service appropriate? Yes; Fluid consistency: Thin  Diet effective now          EDUCATION NEEDS:   Education needs have been addressed  Skin:  Skin Assessment: Reviewed RN Assessment  Last BM:  5/28  Height:   Ht Readings from Last 1 Encounters:  09/07/17 6\' 2"  (1.88 m)    Weight:   Wt Readings from Last 1 Encounters:  09/09/17 146 lb 13.2 oz (66.6 kg)    Ideal Body Weight:  86.4 kg  BMI:  18.7 kg/m^2  Estimated Nutritional Needs:   Kcal:  2000-2200  Protein:  100-110g  Fluid:  2L/day   Clayton Bibles, MS, RD, LDN [place] Dietitian Pager: [phone] After Hours Pager: [phone]

## 2017-09-12 NOTE — Progress Notes (Addendum)
[facility] Progress Note  Patient: Jacob Galvan. XHB:[id number]   PCP: Patient, No Pcp Per DOB: [birth year]   DOA: 09/11/2017   DOS: 09/12/2017   Date of Service: the patient was seen and examined on 09/12/2017  Subjective: Patient continues to complain about having pain on the suture line as well as between the sutures and the umbilicus.  Mentions that her muscles tightens up.  Also comments about having nausea although no episodes of dry heaving in front of me and no episodes of vomiting so far as well. Patient empties his colostomy himself, so far between 7 AM to 11:30 PM has output of 550 cc.  Brief hospital course: Pt. with PMH of rectal adenocarcinoma S/P resection and colostomy creation; admitted on 09/11/2017, presented with complaint of weakness, was found to have hypotension and uncontrolled abdominal pain. Currently further plan is continue management per surgery.  Assessment and Plan: 1.  Hypotension. Etiology is not clear. Patient reportedly has high output ileostomy was in the hospital in the month of May twice for the same. Although on admission his serum creatinine is normal BUN is normal electrolytes are not significantly abnormal.  Urine analysis also does not show any significant evidence of abnormality. Patient responded to IV hydration and therefore will continue IV hydration for now and monitor.  2.  High output ileostomy. Patient has reported high output ileostomy.  Has 550 cc output in last 4 hours but 250 cc output overnight. Patient empties his own bag in a measuring canister. Does not have any evidence of dehydration or hemoconcentration on admission. Continue to monitor output. Continue to follow recommendation from surgery. Starting PPI with hope of improvement in high output ileostomy.  3.  Uncontrolled abdominal pain. Patient reports pins-and-needles-like pain at the suture site and sharp spasmodic pain between the umbilicus and the suture. At the time  of my evaluation patient is appearing comfortable and reports his pain is severe. Patient is telling me that last admission they have been alternating between Dilaudid and oxycodone along with Phenergan which has worked for his pain in the past. He tells me that the fentanyl is not working, oxycodone makes him to sleep and at home he was using gabapentin only as needed. Patient was clearly informed that his pain does not appear to be severe requiring IV narcotics. Patient was informed that he should try oral Percocet first before requesting IV narcotics. IV narcotic from fentanyl changed to morphine. Added scheduled Robaxin. Patient was clearly informed to remain compliant with his gabapentin and he was told that he is not supposed to take it only as needed as it is a scheduled medication.  Gabapentin dose will be increased from 200 mg to 300 mg 3 times daily. Anticipate discharge regimen would be oral Percocet along with scheduled gabapentin and Robaxin.  4.  GERD. Patient reports daily acid reflux.  With excessive secretion causing high output ileostomy, will put the patient on twice a day PPI.  5.  Port-A-Cath removal on 09/05/2017 Klebsiella bacteremia. Do not suspect that the patient actually has ongoing bacteremia. Port-A-Cath has already been removed. On ciprofloxacin which we will continue for 5 more days, last day of antibiotic 09/17/2017.  6.  Rectal adenocarcinoma. S/P robotic assisted low anterior resection with diverting loop ileostomy. CT abdomen pelvis shows no evidence of acute abnormality or obstruction. General surgery consulted. We will follow recommendation.  7.  Elevated LFT. Likely from hypotension. Hepatitis panel has been ordered although results not back yet. Suspicious  for acute hepatitis is less. Likely from hypotension.  8.  Low cortisol level. Cortisol level is relatively low in the morning. We will order Cosyntropin stimulation test for tomorrow.  Diet:  regular diet DVT Prophylaxis: subcutaneous Heparin  Advance goals of care discussion:   Family Communication: no family was present at bedside, at the time of interview.   Disposition:  Discharge to be determined.  Consultants: General surgery  Procedures: none  Antibiotics: Anti-infectives (From admission, onward)   Start     Dose/Rate Route Frequency Ordered Stop   09/12/17 1300  ciprofloxacin (CIPRO) tablet 500 mg     500 mg Oral 2 times daily 09/12/17 1245 09/17/17 0759       Objective: Physical Exam: Vitals:   09/12/17 0030 09/12/17 0134 09/12/17 0925 09/12/17 1411  BP: (!) 80/51 102/68 (!) 102/57 90/66  Pulse:  93 93 93  Resp: 20  16 16   Temp:  98.2 F (36.8 C) 98.3 F (36.8 C) 98.2 F (36.8 C)  TempSrc:  Oral Oral Oral  SpO2: 100% 100% 100% 100%    Intake/Output Summary (Last 24 hours) at 09/12/2017 1548 Last data filed at 09/12/2017 1500 Gross per 24 hour  Intake 4545 ml  Output 250 ml  Net 4295 ml   There were no vitals filed for this visit. General: Alert, Awake and Oriented to Time, Place and Person. Appear in no distress, affect appropriate Eyes: PERRL, Conjunctiva normal ENT: Oral Mucosa clear moist Neck: no JVD, no Abnormal Mass Or lumps Cardiovascular: S1 and S2 Present, no Murmur, Peripheral Pulses Present Respiratory: normal respiratory effort, Bilateral Air entry equal and Decreased, no use of accessory muscle, Clear to Auscultation, no Crackles, no wheezes Abdomen: Bowel Sound present, Soft and no tenderness, no hernia, surgical incision appears healing well.  No evidence of infection. Skin: no redness, no Rash, no induration Extremities: no Pedal edema, no calf tenderness Neurologic: Grossly no focal neuro deficit. Bilaterally Equal motor strength  Data Reviewed: CBC: Recent Labs  Lab 09/11/17 1732 09/11/17 1737 09/12/17 0609  WBC  --  8.7 7.3  HGB 15.3 13.8 11.5*  HCT 45.0 39.6 34.3*  MCV  --  84.8 85.5  PLT  --  624* 536*   Basic  Metabolic Panel: Recent Labs  Lab 09/07/17 0740 09/11/17 1732 09/11/17 1737 09/11/17 1740 09/12/17 0609  NA 138 134* 132*  --  135  K 3.7 3.6 4.9  --  3.6  CL 99* 92* 94*  --  101  CO2 28  --  23  --  25  GLUCOSE 104* 131* 118*  --  106*  BUN 5* 14 15  --  9  CREATININE 0.84 0.90 0.95  --  0.81  CALCIUM 8.7*  --  9.2  --  8.3*  MG  --   --   --  1.6* 2.0    Liver Function Tests: Recent Labs  Lab 09/07/17 0740 09/11/17 1737 09/12/17 0609  AST 48* 83* 53*  ALT 91* 168* 130*  ALKPHOS 196* 199* 147*  BILITOT 0.4 1.4* 0.9  PROT 6.8 8.2* 6.5  ALBUMIN 2.8* 3.7 3.0*   Recent Labs  Lab 09/11/17 1737  LIPASE 32   No results for input(s): AMMONIA in the last 168 hours. Coagulation Profile: No results for input(s): INR, PROTIME in the last 168 hours. Cardiac Enzymes: Recent Labs  Lab 09/12/17 0609  TROPONINI <0.03   BNP (last 3 results) No results for input(s): PROBNP in the last 8760 hours. CBG:  Recent Labs  Lab 09/06/17 0544 09/07/17 0020 09/07/17 0602 09/08/17 0032 09/08/17 0632  GLUCAP 92 91 102* 106* 106*   Studies: Dg Chest 2 View  Result Date: 09/11/2017 CLINICAL DATA:  Shortness of breath.  Tachycardia.  Weakness. EXAM: CHEST - 2 VIEW COMPARISON:  09/03/2017. FINDINGS: Normal sized heart. Clear lungs. The right jugular porta catheter has been removed. Stable exostosis arising from the medial aspect of the proximal humeral shaft. IMPRESSION: No acute abnormality. Electronically Signed   By: Claudie Revering M.D.   On: 09/11/2017 18:09   Ct Angio Chest Pe W/cm &/or Wo Cm  Result Date: 09/11/2017 CLINICAL DATA:  Acute generalized abdominal pain. History of rectal cancer. EXAM: CT ANGIOGRAPHY CHEST CT ABDOMEN AND PELVIS WITH CONTRAST TECHNIQUE: Multidetector CT imaging of the chest was performed using the standard protocol during bolus administration of intravenous contrast. Multiplanar CT image reconstructions and MIPs were obtained to evaluate the vascular  anatomy. Multidetector CT imaging of the abdomen and pelvis was performed using the standard protocol during bolus administration of intravenous contrast. CONTRAST:  79mL OMNIPAQUE IOHEXOL 300 MG/ML SOLN, 80mL ISOVUE-370 IOPAMIDOL (ISOVUE-370) INJECTION 76% COMPARISON:  CT scan of Aug 31, 2017. FINDINGS: CTA CHEST FINDINGS Cardiovascular: Satisfactory opacification of the pulmonary arteries to the segmental level. No evidence of pulmonary embolism. Normal heart size. No pericardial effusion. Mediastinum/Nodes: No enlarged mediastinal, hilar, or axillary lymph nodes. Thyroid gland, trachea, and esophagus demonstrate no significant findings. Lungs/Pleura: Lungs are clear. No pleural effusion or pneumothorax. Musculoskeletal: No chest wall abnormality. No acute or significant osseous findings. Review of the MIP images confirms the above findings. CT ABDOMEN and PELVIS FINDINGS Hepatobiliary: No gallstones are noted. Stable left hepatic low density. No biliary dilatation is noted. Pancreas: Unremarkable. No pancreatic ductal dilatation or surrounding inflammatory changes. Spleen: Normal in size without focal abnormality. Adrenals/Urinary Tract: Adrenal glands are unremarkable. Kidneys are normal, without renal calculi, focal lesion, or hydronephrosis. Bladder is unremarkable. Stomach/Bowel: Stomach appears normal. The appendix appears normal. 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 Postsurgical changes are noted in the rectal region. No definite bowel dilatation is noted. Vascular/Lymphatic: No significant vascular findings are present. No enlarged abdominal or pelvic lymph nodes. Reproductive: Prostate is not well visualized. Other: No abdominal wall hernia or abnormality. No abdominopelvic ascites. Musculoskeletal: No acute or significant osseous findings. Review of the MIP images confirms the above findings. IMPRESSION: No definite evidence of pulmonary embolus. Postsurgical changes are noted in the rectal  region. 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 No abnormal bowel dilatation is noted. Electronically Signed   By: Marijo Conception, M.D.   On: 09/11/2017 20:58   Ct Abdomen Pelvis W Contrast  Result Date: 09/11/2017 CLINICAL DATA:  Acute generalized abdominal pain. History of rectal cancer. EXAM: CT ANGIOGRAPHY CHEST CT ABDOMEN AND PELVIS WITH CONTRAST TECHNIQUE: Multidetector CT imaging of the chest was performed using the standard protocol during bolus administration of intravenous contrast. Multiplanar CT image reconstructions and MIPs were obtained to evaluate the vascular anatomy. Multidetector CT imaging of the abdomen and pelvis was performed using the standard protocol during bolus administration of intravenous contrast. CONTRAST:  22mL OMNIPAQUE IOHEXOL 300 MG/ML SOLN, 4mL ISOVUE-370 IOPAMIDOL (ISOVUE-370) INJECTION 76% COMPARISON:  CT scan of Aug 31, 2017. FINDINGS: CTA CHEST FINDINGS Cardiovascular: Satisfactory opacification of the pulmonary arteries to the segmental level. No evidence of pulmonary embolism. Normal heart size. No pericardial effusion. Mediastinum/Nodes: No enlarged mediastinal, hilar, or axillary lymph nodes. Thyroid gland, trachea, and esophagus demonstrate no  significant findings. Lungs/Pleura: Lungs are clear. No pleural effusion or pneumothorax. Musculoskeletal: No chest wall abnormality. No acute or significant osseous findings. Review of the MIP images confirms the above findings. CT ABDOMEN and PELVIS FINDINGS Hepatobiliary: No gallstones are noted. Stable left hepatic low density. No biliary dilatation is noted. Pancreas: Unremarkable. No pancreatic ductal dilatation or surrounding inflammatory changes. Spleen: Normal in size without focal abnormality. Adrenals/Urinary Tract: Adrenal glands are unremarkable. Kidneys are normal, without renal calculi, focal lesion, or hydronephrosis. Bladder is unremarkable. Stomach/Bowel: Stomach appears normal. The appendix appears  normal. 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 Postsurgical changes are noted in the rectal region. No definite bowel dilatation is noted. Vascular/Lymphatic: No significant vascular findings are present. No enlarged abdominal or pelvic lymph nodes. Reproductive: Prostate is not well visualized. Other: No abdominal wall hernia or abnormality. No abdominopelvic ascites. Musculoskeletal: No acute or significant osseous findings. Review of the MIP images confirms the above findings. IMPRESSION: No definite evidence of pulmonary embolus. Postsurgical changes are noted in the rectal region. 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 No abnormal bowel dilatation is noted. Electronically Signed   By: Marijo Conception, M.D.   On: 09/11/2017 20:58   [place] Ekg Site Rite  Result Date: 09/12/2017 If Site Rite image not attached, placement could not be confirmed due to current cardiac rhythm.   Scheduled Meds: . ciprofloxacin  500 mg Oral BID  . [START ON 09/13/2017] cosyntropin  0.25 mg Intravenous Once  . enoxaparin (LOVENOX) injection  40 mg Subcutaneous Q24H  . feeding supplement  1 Container Oral BID BM  . gabapentin  300 mg Oral TID  . lidocaine  1 patch Transdermal Q24H  . methocarbamol  500 mg Oral TID  . pantoprazole  40 mg Oral BID AC  . protein supplement  8 oz Oral Q2000   Continuous Infusions: . sodium chloride 150 mL/hr at 09/12/17 0142   PRN Meds: morphine injection, ondansetron **OR** ondansetron (ZOFRAN) IV, oxyCODONE-acetaminophen  Time spent: 45 minutes, between 10:35 and 11:30 AM.  Author: Berle Mull, MD [facility] Pager: [phone] 09/12/2017 3:48 PM  If 7PM-7AM, please contact night-coverage at [email or web address], password [facility]

## 2017-09-12 NOTE — H&P (Addendum)
History and Physical    Jacob Galvan. NGE:[id number] DOB: 1976/03/31 DOA: 09/11/2017  PCP: Patient, No Pcp Per  Patient coming from: Home.  Chief Complaint: Weakness.  HPI: Jacob Galvan. is a 42 y.o. 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  Has been having increasing abdominal pain around the colostomy area.  Has been a increasing output since last evening.  Denies any nausea vomiting.  Denies any chest pain but had mild shortness of breath when he was feeling weak.  ED Course: In the ER patient had CT abdomen and pelvis and CT angiogram of the chest which was not showing anything acute.  On-call general surgeon was consulted by ER physician will be seeing patient in consult and since patient was hypotensive was given fluid bolus and hospitalist requested for admission.  On my exam patient is not in distress.  Blood pressure improved with fluid bolus.  Review of Systems: As per HPI, rest all negative.   Past Medical History:  Diagnosis Date  . Headache   . Rectal cancer ([place]) 2018   radiation and Chemo    Past Surgical History:  Procedure Laterality Date  . COLONOSCOPY    . LAPAROSCOPIC DIVERTED COLOSTOMY N/A 07/20/2017   Procedure: DIVERTING LOOP COLOSTOMY;  Surgeon: Leighton Ruff, MD;  Location: [facility];  Service: General;  Laterality: N/A;  . PORT-A-CATH REMOVAL N/A 09/05/2017   Procedure: REMOVAL PORT-A-CATH;  Surgeon: Leighton Ruff, MD;  Location: [facility];  Service: General;  Laterality: N/A;  . PORTA CATH INSERTION    . WISDOM TOOTH EXTRACTION    . XI ROBOTIC ASSISTED LOWER ANTERIOR RESECTION N/A 07/20/2017   Procedure: XI ROBOTIC ASSISTED LOWER ANTERIOR RESECTION ERAS PATHWAY;  Surgeon: Leighton Ruff, MD;  Location: [facility];  Service: General;  Laterality: N/A;     reports that he has been smoking.  He has never used smokeless tobacco. He reports that he drinks alcohol. He reports that he has current or past drug history. Drug: Marijuana.  No Known Allergies  Family History  Problem Relation Age of Onset  . Diabetes Other   . Diabetes Maternal Grandmother   . Colon cancer Paternal Grandfather   . Pancreatic cancer Paternal Grandfather 28  . Breast cancer Paternal Aunt 75  . Melanoma Sister 81       x2  . Testicular cancer Maternal Grandfather 48  . COPD Paternal Grandmother   . Colon cancer Paternal Uncle 2  . Pancreatic cancer Paternal Uncle   . Testicular cancer Other   . Heart Problems Son 5    Prior to Admission medications   Medication Sig Start Date End Date Taking? Authorizing Provider  ciprofloxacin (CIPRO) 500 MG tablet Take 1 tablet (500 mg total) by mouth 2 (two) times daily. 09/10/17   Alphonsa Overall, MD  gabapentin (NEURONTIN) 100 MG capsule Take 2 capsules (200 mg total) by mouth 3 (three) times daily. 07/17/00   Leighton Ruff, MD  ibuprofen (ADVIL,MOTRIN) 200 MG tablet Take 400 mg by mouth as needed for moderate pain.     [provider]  methocarbamol (ROBAXIN) 500 MG tablet Take 2 tablets (1,000 mg total) by mouth every 6 (six) hours as needed for muscle spasms. 11/10/34   Leighton Ruff, MD  ondansetron (ZOFRAN) 4 MG tablet Take 1 tablet (4  mg total) by mouth every 6 (six) hours as needed for nausea. 5/40/98   Leighton Ruff, MD  oxyCODONE (OXY IR/ROXICODONE) 5 MG immediate release tablet Take 1-2 tablets (5-10 mg total) by mouth every 4 (four) hours as needed for moderate pain, severe pain or breakthrough pain. 05/06/12   Leighton Ruff, MD  sodium chloride 0.9 % Inject 1,000 mLs into the vein 2 (two) times daily. 7/82/95   Leighton Ruff, MD    Physical Exam: Vitals:   09/11/17 2100 09/11/17 2200 09/11/17 2227 09/11/17 2230  BP: 100/61 91/68 91/62  91/62  Pulse:   (!) 106 (!) 102  Resp: 17 (!) 23 19 15   Temp:    98.6 F (37 C)   TempSrc:   Oral   SpO2:    99%      Constitutional: Moderately built and nourished. Vitals:   09/11/17 2100 09/11/17 2200 09/11/17 2227 09/11/17 2230  BP: 100/61 91/68 91/62  91/62  Pulse:   (!) 106 (!) 102  Resp: 17 (!) 23 19 15   Temp:   98.6 F (37 C)   TempSrc:   Oral   SpO2:    99%   Eyes: Anicteric no pallor. ENMT: No discharge from the ears eyes nose or mouth. Neck: No mass felt.  No neck rigidity.  No JVD appreciated. Respiratory: No rhonchi or crepitations. Cardiovascular: S1-S2 heard no murmurs appreciated. Abdomen: Soft nontender bowel sounds present.  Colostomy bag seen with liquid yellow stools. Musculoskeletal: No edema. Skin: No rash. Neurologic: Alert awake oriented to time place and person.  Moves all extremities. Psychiatric: Appears normal with a normal affect.   Labs on Admission: I have personally reviewed following labs and imaging studies  CBC: Recent Labs  Lab 09/11/17 1732 09/11/17 1737  WBC  --  8.7  HGB 15.3 13.8  HCT 45.0 39.6  MCV  --  84.8  PLT  --  621*   Basic Metabolic Panel: Recent Labs  Lab 09/05/17 0611 09/07/17 0740 09/11/17 1732 09/11/17 1737 09/11/17 1740  NA 138 138 134* 132*  --   K 3.8 3.7 3.6 4.9  --   CL 106 99* 92* 94*  --   CO2 24 28  --  23  --   GLUCOSE 95 104* 131* 118*  --   BUN 5* 5* 14 15  --   CREATININE 0.67 0.84 0.90 0.95  --   CALCIUM 7.7* 8.7*  --  9.2  --   MG 1.8  --   --   --  1.6*   GFR: Estimated Creatinine Clearance: 98.3 mL/min (by C-G formula based on SCr of 0.95 mg/dL). Liver Function Tests: Recent Labs  Lab 09/07/17 0740 09/11/17 1737  AST 48* 83*  ALT 91* 168*  ALKPHOS 196* 199*  BILITOT 0.4 1.4*  PROT 6.8 8.2*  ALBUMIN 2.8* 3.7   Recent Labs  Lab 09/11/17 1737  LIPASE 32   No results for input(s): AMMONIA in the last 168 hours. Coagulation Profile: No results for input(s): INR, PROTIME in the last 168 hours. Cardiac Enzymes: No results for input(s):  CKTOTAL, CKMB, CKMBINDEX, TROPONINI in the last 168 hours. BNP (last 3 results) No results for input(s): PROBNP in the last 8760 hours. HbA1C: No results for input(s): HGBA1C in the last 72 hours. CBG: Recent Labs  Lab 09/06/17 0544 09/07/17 0020 09/07/17 0602 09/08/17 0032 09/08/17 0632  GLUCAP 92 91 102* 106* 106*   Lipid Profile: No results for input(s): CHOL, HDL, LDLCALC, TRIG, CHOLHDL, LDLDIRECT  in the last 72 hours. Thyroid Function Tests: No results for input(s): TSH, T4TOTAL, FREET4, T3FREE, THYROIDAB in the last 72 hours. Anemia Panel: No results for input(s): VITAMINB12, FOLATE, FERRITIN, TIBC, IRON, RETICCTPCT in the last 72 hours. Urine analysis:    Component Value Date/Time   COLORURINE YELLOW 09/11/2017 2227   APPEARANCEUR CLEAR 09/11/2017 2227   LABSPEC >1.046 (H) 09/11/2017 2227   PHURINE 6.0 09/11/2017 2227   GLUCOSEU NEGATIVE 09/11/2017 2227   HGBUR NEGATIVE 09/11/2017 2227   BILIRUBINUR NEGATIVE 09/11/2017 2227   KETONESUR NEGATIVE 09/11/2017 2227   PROTEINUR NEGATIVE 09/11/2017 2227   NITRITE NEGATIVE 09/11/2017 2227   LEUKOCYTESUR NEGATIVE 09/11/2017 2227   Sepsis Labs: @LABRCNTIP (procalcitonin:4,lacticidven:4) ) Recent Results (from the past 240 hour(s))  Culture, blood (routine x 2)     Status: Abnormal   Collection Time: 09/03/17 12:57 AM  Result Value Ref Range Status   Specimen Description   Final    BLOOD RIGHT ANTECUBITAL Performed at [facility], [place] [address] [place], [place] [address]    Special Requests   Final    BOTTLES DRAWN AEROBIC ONLY Blood Culture results may not be optimal due to an inadequate volume of blood received in culture bottles Performed at [place] [address] [place], [place] [address]    Culture  Setup Time   Final    GRAM NEGATIVE RODS AEROBIC BOTTLE ONLY CRITICAL VALUE NOTED.  VALUE IS CONSISTENT WITH PREVIOUSLY REPORTED AND CALLED VALUE.    Culture (A)   Final    KLEBSIELLA OXYTOCA SUSCEPTIBILITIES PERFORMED ON PREVIOUS CULTURE WITHIN THE LAST 5 DAYS. Performed at [facility], [place] [address] [place], [place] [address]    Report Status 09/05/2017 FINAL  Final  Culture, blood (routine x 2)     Status: Abnormal   Collection Time: 09/03/17  1:01 AM  Result Value Ref Range Status   Specimen Description   Final    BLOOD LEFT ANTECUBITAL Performed at [place] [address] [place], [place] [address]    Special Requests   Final    BOTTLES DRAWN AEROBIC AND ANAEROBIC Blood Culture adequate volume Performed at [place] [address], [place], [place] [address]    Culture  Setup Time   Final    GRAM NEGATIVE RODS IN BOTH AEROBIC AND ANAEROBIC BOTTLES CRITICAL RESULT CALLED TO, READ BACK BY AND VERIFIED WITH: J GADHIA,PHARMD AT 1712 09/03/17 BY L BENFIELD Performed at [facility], [place] [address] [place],  [address]    Culture KLEBSIELLA OXYTOCA (A)  Final   Report Status 09/05/2017 FINAL  Final   Organism ID, Bacteria KLEBSIELLA OXYTOCA  Final      Susceptibility   Klebsiella oxytoca - MIC*    AMPICILLIN >=32 RESISTANT Resistant     CEFAZOLIN 8 SENSITIVE Sensitive     CEFEPIME <=1 SENSITIVE Sensitive     CEFTAZIDIME <=1 SENSITIVE Sensitive     CEFTRIAXONE <=1 SENSITIVE Sensitive     CIPROFLOXACIN <=0.25 SENSITIVE Sensitive     GENTAMICIN <=1 SENSITIVE Sensitive     IMIPENEM <=0.25 SENSITIVE Sensitive     TRIMETH/SULFA <=20 SENSITIVE Sensitive     AMPICILLIN/SULBACTAM 16 INTERMEDIATE Intermediate     PIP/TAZO <=4 SENSITIVE Sensitive     Extended ESBL NEGATIVE Sensitive     * KLEBSIELLA OXYTOCA  Blood Culture ID Panel (Reflexed)     Status: Abnormal   Collection Time: 09/03/17  1:01 AM  Result Value Ref Range Status   Enterococcus species NOT DETECTED NOT DETECTED Final   Listeria monocytogenes NOT DETECTED NOT DETECTED Final   Staphylococcus species NOT DETECTED  NOT DETECTED Final   Staphylococcus aureus NOT DETECTED NOT DETECTED Final   Streptococcus species NOT DETECTED NOT DETECTED Final   Streptococcus agalactiae NOT DETECTED NOT DETECTED Final   Streptococcus pneumoniae NOT DETECTED NOT DETECTED Final   Streptococcus pyogenes NOT DETECTED NOT DETECTED Final   Acinetobacter baumannii NOT DETECTED NOT DETECTED Final   Enterobacteriaceae species DETECTED (A) NOT DETECTED Final    Comment: Enterobacteriaceae represent a large family of gram-negative bacteria, not a single organism. CRITICAL RESULT CALLED TO, READ BACK BY AND VERIFIED WITH: J GADHIA,PHARMD AT 1712 09/03/17 BY L BENFIELD    Enterobacter cloacae complex NOT DETECTED NOT DETECTED Final   Escherichia coli NOT DETECTED NOT DETECTED Final   Klebsiella oxytoca DETECTED (A) NOT DETECTED Final    Comment: CRITICAL RESULT CALLED TO, READ BACK BY AND VERIFIED WITH: J GADHIA,PHARMD AT 1712 09/03/17 BY L BENFIELD    Klebsiella pneumoniae NOT DETECTED NOT DETECTED Final   Proteus species NOT DETECTED NOT DETECTED Final   Serratia marcescens NOT DETECTED NOT DETECTED Final   Carbapenem resistance NOT DETECTED NOT DETECTED Final   Haemophilus influenzae NOT DETECTED NOT DETECTED Final   Neisseria meningitidis NOT DETECTED NOT DETECTED Final   Pseudomonas aeruginosa NOT DETECTED NOT DETECTED Final   Candida albicans NOT DETECTED NOT DETECTED Final   Candida glabrata NOT DETECTED NOT DETECTED Final   Candida krusei NOT DETECTED NOT DETECTED Final   Candida parapsilosis NOT DETECTED NOT DETECTED Final   Candida tropicalis NOT DETECTED NOT DETECTED Final    Comment: Performed at [facility], [place]. [address], [place], [place] [address]     Radiological Exams on Admission: Dg Chest 2 View  Result Date: 09/11/2017 CLINICAL DATA:  Shortness of breath.  Tachycardia.  Weakness. EXAM: CHEST - 2 VIEW COMPARISON:  09/03/2017. FINDINGS: Normal sized heart. Clear lungs. The right jugular porta  catheter has been removed. Stable exostosis arising from the medial aspect of the proximal humeral shaft. IMPRESSION: No acute abnormality. Electronically Signed   By: Claudie Revering M.D.   On: 09/11/2017 18:09   Ct Angio Chest Pe W/cm &/or Wo Cm  Result Date: 09/11/2017 CLINICAL DATA:  Acute generalized abdominal pain. History of rectal cancer. EXAM: CT ANGIOGRAPHY CHEST CT ABDOMEN AND PELVIS WITH CONTRAST TECHNIQUE: Multidetector CT imaging of the chest was performed using the standard protocol during bolus administration of intravenous contrast. Multiplanar CT image reconstructions and MIPs were obtained to evaluate the vascular anatomy. Multidetector CT imaging of the abdomen and pelvis was performed using the standard protocol during bolus administration of intravenous contrast. CONTRAST:  14mL OMNIPAQUE IOHEXOL 300 MG/ML SOLN, 27mL ISOVUE-370 IOPAMIDOL (ISOVUE-370) INJECTION 76% COMPARISON:  CT scan of Aug 31, 2017. FINDINGS: CTA CHEST FINDINGS Cardiovascular: Satisfactory opacification of the pulmonary arteries to the segmental level. No evidence of pulmonary embolism. Normal heart size. No pericardial effusion. Mediastinum/Nodes: No enlarged mediastinal, hilar, or axillary lymph nodes. Thyroid gland, trachea, and esophagus demonstrate no significant findings. Lungs/Pleura: Lungs are clear. No pleural effusion or pneumothorax. Musculoskeletal: No chest wall abnormality. No acute or significant osseous findings. Review of the MIP images confirms the above findings. CT ABDOMEN and PELVIS FINDINGS Hepatobiliary: No gallstones are noted. Stable left hepatic low density. No biliary dilatation is noted. Pancreas: Unremarkable. No pancreatic ductal dilatation or surrounding inflammatory changes. Spleen: Normal  in size without focal abnormality. Adrenals/Urinary Tract: Adrenal glands are unremarkable. Kidneys are normal, without renal calculi, focal lesion, or hydronephrosis. Bladder is unremarkable. Stomach/Bowel:  Stomach appears normal. The appendix appears normal. 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 Postsurgical changes are noted in the rectal region. No definite bowel dilatation is noted. Vascular/Lymphatic: No significant vascular findings are present. No enlarged abdominal or pelvic lymph nodes. Reproductive: Prostate is not well visualized. Other: No abdominal wall hernia or abnormality. No abdominopelvic ascites. Musculoskeletal: No acute or significant osseous findings. Review of the MIP images confirms the above findings. IMPRESSION: No definite evidence of pulmonary embolus. Postsurgical changes are noted in the rectal region. 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 No abnormal bowel dilatation is noted. Electronically Signed   By: Marijo Conception, M.D.   On: 09/11/2017 20:58   Ct Abdomen Pelvis W Contrast  Result Date: 09/11/2017 CLINICAL DATA:  Acute generalized abdominal pain. History of rectal cancer. EXAM: CT ANGIOGRAPHY CHEST CT ABDOMEN AND PELVIS WITH CONTRAST TECHNIQUE: Multidetector CT imaging of the chest was performed using the standard protocol during bolus administration of intravenous contrast. Multiplanar CT image reconstructions and MIPs were obtained to evaluate the vascular anatomy. Multidetector CT imaging of the abdomen and pelvis was performed using the standard protocol during bolus administration of intravenous contrast. CONTRAST:  21mL OMNIPAQUE IOHEXOL 300 MG/ML SOLN, 72mL ISOVUE-370 IOPAMIDOL (ISOVUE-370) INJECTION 76% COMPARISON:  CT scan of Aug 31, 2017. FINDINGS: CTA CHEST FINDINGS Cardiovascular: Satisfactory opacification of the pulmonary arteries to the segmental level. No evidence of pulmonary embolism. Normal heart size. No pericardial effusion. Mediastinum/Nodes: No enlarged mediastinal, hilar, or axillary lymph nodes. Thyroid gland, trachea, and esophagus demonstrate no significant findings. Lungs/Pleura: Lungs are clear. No pleural effusion or pneumothorax.  Musculoskeletal: No chest wall abnormality. No acute or significant osseous findings. Review of the MIP images confirms the above findings. CT ABDOMEN and PELVIS FINDINGS Hepatobiliary: No gallstones are noted. Stable left hepatic low density. No biliary dilatation is noted. Pancreas: Unremarkable. No pancreatic ductal dilatation or surrounding inflammatory changes. Spleen: Normal in size without focal abnormality. Adrenals/Urinary Tract: Adrenal glands are unremarkable. Kidneys are normal, without renal calculi, focal lesion, or hydronephrosis. Bladder is unremarkable. Stomach/Bowel: Stomach appears normal. The appendix appears normal. 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 Postsurgical changes are noted in the rectal region. No definite bowel dilatation is noted. Vascular/Lymphatic: No significant vascular findings are present. No enlarged abdominal or pelvic lymph nodes. Reproductive: Prostate is not well visualized. Other: No abdominal wall hernia or abnormality. No abdominopelvic ascites. Musculoskeletal: No acute or significant osseous findings. Review of the MIP images confirms the above findings. IMPRESSION: No definite evidence of pulmonary embolus. Postsurgical changes are noted in the rectal region. 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 No abnormal bowel dilatation is noted. Electronically Signed   By: Marijo Conception, M.D.   On: 09/11/2017 20:58    EKG: Independently reviewed.  Sinus tachycardia.  Assessment/Plan Principal Problem:   Hypotension Active Problems:   Rectal adenocarcinoma [id number] s/p LAR/ileostomy 07/20/2017   Protein-calorie malnutrition, severe   Dehydration   Hypomagnesemia    1. Hypotension likely from dehydration from high output ileostomy -continue with aggressive hydration.  Closely for intake output metabolic panel.  Patient does not appear to be septic.  Check cortisol levels. 2. Hypomagnesemia -likely from high output ileostomy.  Replace recheck follow  metabolic panel. 3. Rectal adenocarcinoma status post robotic assisted low anterior resection with diverting loop ileostomy -CT scan does  not show any obstruction.  General surgery has been consulted. 4. Elevated LFTs likely from hypotension.  Follow LFTs.  Will check acute hepatitis panel.   DVT prophylaxis: Lovenox. Code Status: Full code. Family Communication: Discussed with patient. Disposition Plan: Home. Consults called: General surgery. Admission status: Observation.   Rise Patience MD [facility] Pager [phone].  If 7PM-7AM, please contact night-coverage [email or web address] Password [facility]  09/12/2017, 12:04 AM

## 2017-09-12 NOTE — Progress Notes (Signed)
Hypotension  Subjective: Pt still having trouble keeping up with fluids  Objective: Vital signs in last 24 hours: Temp:  [98.2 F (36.8 C)-98.6 F (37 C)] 98.3 F (36.8 C) (05/28 0925) Pulse Rate:  [93-140] 93 (05/28 0925) Resp:  [11-26] 16 (05/28 0925) BP: (80-102)/(51-81) 102/57 (05/28 0925) SpO2:  [97 %-100 %] 100 % (05/28 0925) Last BM Date: 09/12/17  Intake/Output from previous day: 05/27 0701 - 05/28 0700 In: 2817.5 [I.V.:267.5; IV Piggyback:2550] Out: 250 [Stool:250] Intake/Output this shift: No intake/output data recorded.  General appearance: alert and cooperative GI: normal findings: soft, non-tender  Lab Results:  Results for orders placed or performed during the hospital encounter of 09/11/17 (from the past 24 hour(s))  I-stat troponin, ED     Status: None   Collection Time: 09/11/17  5:31 PM  Result Value Ref Range   Troponin i, poc 0.00 0.00 - 0.08 ng/mL   Comment 3          I-stat Chem 8, ED     Status: Abnormal   Collection Time: 09/11/17  5:32 PM  Result Value Ref Range   Sodium 134 (L) 135 - 145 mmol/L   Potassium 3.6 3.5 - 5.1 mmol/L   Chloride 92 (L) 101 - 111 mmol/L   BUN 14 6 - 20 mg/dL   Creatinine, Ser 0.90 0.61 - 1.24 mg/dL   Glucose, Bld 131 (H) 65 - 99 mg/dL   Calcium, Ion 1.10 (L) 1.15 - 1.40 mmol/L   TCO2 27 22 - 32 mmol/L   Hemoglobin 15.3 13.0 - 17.0 g/dL   HCT 45.0 39.0 - 31.4 %  Basic metabolic panel     Status: Abnormal   Collection Time: 09/11/17  5:37 PM  Result Value Ref Range   Sodium 132 (L) 135 - 145 mmol/L   Potassium 4.9 3.5 - 5.1 mmol/L   Chloride 94 (L) 101 - 111 mmol/L   CO2 23 22 - 32 mmol/L   Glucose, Bld 118 (H) 65 - 99 mg/dL   BUN 15 6 - 20 mg/dL   Creatinine, Ser 0.95 0.61 - 1.24 mg/dL   Calcium 9.2 8.9 - 10.3 mg/dL   GFR calc non Af Amer >60 >60 mL/min   GFR calc Af Amer >60 >60 mL/min   Anion gap 15 5 - 15  CBC     Status: Abnormal   Collection Time: 09/11/17  5:37 PM  Result Value Ref Range   WBC 8.7  4.0 - 10.5 K/uL   RBC 4.67 4.22 - 5.81 MIL/uL   Hemoglobin 13.8 13.0 - 17.0 g/dL   HCT 39.6 39.0 - 52.0 %   MCV 84.8 78.0 - 100.0 fL   MCH 29.6 26.0 - 34.0 pg   MCHC 34.8 30.0 - 36.0 g/dL   RDW 14.3 11.5 - 15.5 %   Platelets 624 (H) 150 - 400 K/uL  Hepatic function panel     Status: Abnormal   Collection Time: 09/11/17  5:37 PM  Result Value Ref Range   Total Protein 8.2 (H) 6.5 - 8.1 g/dL   Albumin 3.7 3.5 - 5.0 g/dL   AST 83 (H) 15 - 41 U/L   ALT 168 (H) 17 - 63 U/L   Alkaline Phosphatase 199 (H) 38 - 126 U/L   Total Bilirubin 1.4 (H) 0.3 - 1.2 mg/dL   Bilirubin, Direct 0.4 0.1 - 0.5 mg/dL   Indirect Bilirubin 1.0 (H) 0.3 - 0.9 mg/dL  Lipase, blood     Status: None  Collection Time: 09/11/17  5:37 PM  Result Value Ref Range   Lipase 32 11 - 51 U/L  Magnesium     Status: Abnormal   Collection Time: 09/11/17  5:40 PM  Result Value Ref Range   Magnesium 1.6 (L) 1.7 - 2.4 mg/dL  I-Stat CG4 Lactic Acid, ED     Status: None   Collection Time: 09/11/17  7:13 PM  Result Value Ref Range   Lactic Acid, Venous 1.45 0.5 - 1.9 mmol/L  Urinalysis, Routine w reflex microscopic     Status: Abnormal   Collection Time: 09/11/17 10:27 PM  Result Value Ref Range   Color, Urine YELLOW YELLOW   APPearance CLEAR CLEAR   Specific Gravity, Urine >1.046 (H) 1.005 - 1.030   pH 6.0 5.0 - 8.0   Glucose, UA NEGATIVE NEGATIVE mg/dL   Hgb urine dipstick NEGATIVE NEGATIVE   Bilirubin Urine NEGATIVE NEGATIVE   Ketones, ur NEGATIVE NEGATIVE mg/dL   Protein, ur NEGATIVE NEGATIVE mg/dL   Nitrite NEGATIVE NEGATIVE   Leukocytes, UA NEGATIVE NEGATIVE  Basic metabolic panel     Status: Abnormal   Collection Time: 09/12/17  6:09 AM  Result Value Ref Range   Sodium 135 135 - 145 mmol/L   Potassium 3.6 3.5 - 5.1 mmol/L   Chloride 101 101 - 111 mmol/L   CO2 25 22 - 32 mmol/L   Glucose, Bld 106 (H) 65 - 99 mg/dL   BUN 9 6 - 20 mg/dL   Creatinine, Ser 0.81 0.61 - 1.24 mg/dL   Calcium 8.3 (L) 8.9 - 10.3  mg/dL   GFR calc non Af Amer >60 >60 mL/min   GFR calc Af Amer >60 >60 mL/min   Anion gap 9 5 - 15  CBC     Status: Abnormal   Collection Time: 09/12/17  6:09 AM  Result Value Ref Range   WBC 7.3 4.0 - 10.5 K/uL   RBC 4.01 (L) 4.22 - 5.81 MIL/uL   Hemoglobin 11.5 (L) 13.0 - 17.0 g/dL   HCT 34.3 (L) 39.0 - 52.0 %   MCV 85.5 78.0 - 100.0 fL   MCH 28.7 26.0 - 34.0 pg   MCHC 33.5 30.0 - 36.0 g/dL   RDW 14.5 11.5 - 15.5 %   Platelets 536 (H) 150 - 400 K/uL  Hepatic function panel     Status: Abnormal   Collection Time: 09/12/17  6:09 AM  Result Value Ref Range   Total Protein 6.5 6.5 - 8.1 g/dL   Albumin 3.0 (L) 3.5 - 5.0 g/dL   AST 53 (H) 15 - 41 U/L   ALT 130 (H) 17 - 63 U/L   Alkaline Phosphatase 147 (H) 38 - 126 U/L   Total Bilirubin 0.9 0.3 - 1.2 mg/dL   Bilirubin, Direct 0.2 0.1 - 0.5 mg/dL   Indirect Bilirubin 0.7 0.3 - 0.9 mg/dL  Magnesium     Status: None   Collection Time: 09/12/17  6:09 AM  Result Value Ref Range   Magnesium 2.0 1.7 - 2.4 mg/dL  Cortisol     Status: None   Collection Time: 09/12/17  6:09 AM  Result Value Ref Range   Cortisol, Plasma 6.7 ug/dL  Troponin I     Status: None   Collection Time: 09/12/17  6:09 AM  Result Value Ref Range   Troponin I <0.03 <0.03 ng/mL  Acetaminophen level     Status: Abnormal   Collection Time: 09/12/17  9:37 AM  Result Value Ref Range  Acetaminophen (Tylenol), Serum <10 (L) 10 - 30 ug/mL     Studies/Results Radiology     MEDS, Scheduled . ciprofloxacin  500 mg Oral BID  . [START ON 09/13/2017] cosyntropin  0.25 mg Intravenous Once  . enoxaparin (LOVENOX) injection  40 mg Subcutaneous Q24H  . feeding supplement  1 Container Oral BID BM  . gabapentin  300 mg Oral TID  . lidocaine  1 patch Transdermal Q24H  . methocarbamol  500 mg Oral TID  . pantoprazole  40 mg Oral BID AC  . protein supplement  8 oz Oral Q2000     Assessment: Hypotension High output ileostomy  Plan: Cannot use anti-motility agents  due to pt's pSBO.  Will have PICC placed with IVF administration twice a week at home.  Central line infection: cont cipro for 5 more days.  Will remove sutures prior to d/c home  Hopefully pt can go home again tomorrow.  Home health already in place    LOS: 0 days    Rosario Adie, MD [facility] Surgery, [place]   09/12/2017 12:45 PM

## 2017-09-12 NOTE — Progress Notes (Signed)
[facility]  Active patient with [facility] prior to this readmission.  [facility] is providing [place] services, RN and [facility] services for IV hydration at present/TPN in the recent past.  If patient discharges after hours, please call [phone].   [name] 09/12/2017, 8:23 AM

## 2017-09-12 NOTE — ED Notes (Signed)
ED TO INPATIENT HANDOFF REPORT  Name/Age/Gender Jacob Galvan. 42 y.o. male  Code Status    Code Status Orders  (From admission, onward)        Start     Ordered   09/12/17 0003  Full code  Continuous     09/12/17 0003    Code Status History    Date Active Date Inactive Code Status Order ID Comments User Context   09/01/2017 2122 09/10/2017 2034 Full Code [id number]  Coralie Keens, MD Inpatient   07/20/2017 1347 08/29/2017 1741 Full Code [id number]  Leighton Ruff, MD Inpatient      Home/SNF/Other Home  Chief Complaint shortness of breath, generalized weakness, cancer pt  Level of Care/Admitting Diagnosis ED Disposition    ED Disposition Condition [facility]: [facility] [[id number]]  Level of Care: Telemetry [5]  Admit to tele based on following criteria: Monitor for Ischemic changes  Diagnosis: Hypotension [665993]  Admitting Physician: Rise Patience [phone]  Attending Physician: Rise Patience [phone]  PT Class (Do Not Modify): Observation [104]  PT Acc Code (Do Not Modify): Observation [10022]       Medical History Past Medical History:  Diagnosis Date  . Headache   . Rectal cancer ([place]) 2018   radiation and Chemo    Allergies No Known Allergies  IV Location/Drains/Wounds Patient Lines/Drains/Airways Status   Active Line/Drains/Airways    Name:   Placement date:   Placement time:   Site:   Days:   Peripheral IV 09/08/17 Left;Anterior Forearm   09/08/17    1655    Forearm   4   Peripheral IV 09/11/17 Right Antecubital   09/11/17    1800    Antecubital   1   Colostomy RLQ   07/20/17    1130    RLQ   54   Incision (Closed) 07/20/17 Abdomen Other (Comment)   07/20/17    1214     54   Incision (Closed) 09/05/17 Chest Other (Comment)   09/05/17    1344     7   Incision - 1 Port Abdomen Right;Mid;Medial   07/20/17    0825     54   Incision - 2 Ports Abdomen Right;Mid;Upper Left;Lateral   07/20/17    0825      54          Labs/Imaging Results for orders placed or performed during the hospital encounter of 09/11/17 (from the past 48 hour(s))  I-stat troponin, ED     Status: None   Collection Time: 09/11/17  5:31 PM  Result Value Ref Range   Troponin i, poc 0.00 0.00 - 0.08 ng/mL   Comment 3            Comment: Due to the release kinetics of cTnI, a negative result within the first hours of the onset of symptoms does not rule out myocardial infarction with certainty. If myocardial infarction is still suspected, repeat the test at appropriate intervals.   I-stat Chem 8, ED     Status: Abnormal   Collection Time: 09/11/17  5:32 PM  Result Value Ref Range   Sodium 134 (L) 135 - 145 mmol/L   Potassium 3.6 3.5 - 5.1 mmol/L   Chloride 92 (L) 101 - 111 mmol/L   BUN 14 6 - 20 mg/dL   Creatinine, Ser 0.90 0.61 - 1.24 mg/dL   Glucose, Bld 131 (H) 65 - 99 mg/dL   Calcium,  Ion 1.10 (L) 1.15 - 1.40 mmol/L   TCO2 27 22 - 32 mmol/L   Hemoglobin 15.3 13.0 - 17.0 g/dL   HCT 45.0 39.0 - 16.0 %  Basic metabolic panel     Status: Abnormal   Collection Time: 09/11/17  5:37 PM  Result Value Ref Range   Sodium 132 (L) 135 - 145 mmol/L   Potassium 4.9 3.5 - 5.1 mmol/L    Comment: DELTA CHECK NOTED REPEATED TO VERIFY NO VISIBLE HEMOLYSIS    Chloride 94 (L) 101 - 111 mmol/L   CO2 23 22 - 32 mmol/L   Glucose, Bld 118 (H) 65 - 99 mg/dL   BUN 15 6 - 20 mg/dL   Creatinine, Ser 0.95 0.61 - 1.24 mg/dL   Calcium 9.2 8.9 - 10.3 mg/dL   GFR calc non Af Amer >60 >60 mL/min   GFR calc Af Amer >60 >60 mL/min    Comment: (NOTE) The eGFR has been calculated using the CKD EPI equation. This calculation has not been validated in all clinical situations. eGFR's persistently <60 mL/min signify possible Chronic Kidney Disease.    Anion gap 15 5 - 15    Comment: Performed at [facility], [place] [address] [place], [place] [address]  CBC     Status: Abnormal   Collection Time: 09/11/17  5:37 PM   Result Value Ref Range   WBC 8.7 4.0 - 10.5 K/uL   RBC 4.67 4.22 - 5.81 MIL/uL   Hemoglobin 13.8 13.0 - 17.0 g/dL   HCT 39.6 39.0 - 52.0 %   MCV 84.8 78.0 - 100.0 fL   MCH 29.6 26.0 - 34.0 pg   MCHC 34.8 30.0 - 36.0 g/dL   RDW 14.3 11.5 - 15.5 %   Platelets 624 (H) 150 - 400 K/uL    Comment: Performed at [facility], [place] [address], [place], [place] [address]  Hepatic function panel     Status: Abnormal   Collection Time: 09/11/17  5:37 PM  Result Value Ref Range   Total Protein 8.2 (H) 6.5 - 8.1 g/dL   Albumin 3.7 3.5 - 5.0 g/dL   AST 83 (H) 15 - 41 U/L   ALT 168 (H) 17 - 63 U/L   Alkaline Phosphatase 199 (H) 38 - 126 U/L   Total Bilirubin 1.4 (H) 0.3 - 1.2 mg/dL   Bilirubin, Direct 0.4 0.1 - 0.5 mg/dL   Indirect Bilirubin 1.0 (H) 0.3 - 0.9 mg/dL    Comment: Performed at [facility], [place] [address] [place], [place] [address]  Lipase, blood     Status: None   Collection Time: 09/11/17  5:37 PM  Result Value Ref Range   Lipase 32 11 - 51 U/L    Comment: Performed at [facility], [place] [address], [place], [place] [address]  Magnesium     Status: Abnormal   Collection Time: 09/11/17  5:40 PM  Result Value Ref Range   Magnesium 1.6 (L) 1.7 - 2.4 mg/dL    Comment: Performed at [facility], [place] [address] [place],  [address]  I-Stat CG4 Lactic Acid, ED     Status: None   Collection Time: 09/11/17  7:13 PM  Result Value Ref Range   Lactic Acid, Venous 1.45 0.5 - 1.9 mmol/L  Urinalysis, Routine w reflex microscopic     Status: Abnormal   Collection Time: 09/11/17 10:27 PM  Result Value Ref Range   Color, Urine  YELLOW YELLOW   APPearance CLEAR CLEAR   Specific Gravity, Urine >1.046 (H) 1.005 - 1.030   pH 6.0 5.0 - 8.0   Glucose, UA NEGATIVE NEGATIVE mg/dL   Hgb urine dipstick NEGATIVE NEGATIVE   Bilirubin Urine NEGATIVE NEGATIVE   Ketones, ur NEGATIVE NEGATIVE mg/dL   Protein, ur NEGATIVE  NEGATIVE mg/dL   Nitrite NEGATIVE NEGATIVE   Leukocytes, UA NEGATIVE NEGATIVE    Comment: Performed at [facility], [place] [address], [place], [place] [address]   Dg Chest 2 View  Result Date: 09/11/2017 CLINICAL DATA:  Shortness of breath.  Tachycardia.  Weakness. EXAM: CHEST - 2 VIEW COMPARISON:  09/03/2017. FINDINGS: Normal sized heart. Clear lungs. The right jugular porta catheter has been removed. Stable exostosis arising from the medial aspect of the proximal humeral shaft. IMPRESSION: No acute abnormality. Electronically Signed   By: Claudie Revering M.D.   On: 09/11/2017 18:09   Ct Angio Chest Pe W/cm &/or Wo Cm  Result Date: 09/11/2017 CLINICAL DATA:  Acute generalized abdominal pain. History of rectal cancer. EXAM: CT ANGIOGRAPHY CHEST CT ABDOMEN AND PELVIS WITH CONTRAST TECHNIQUE: Multidetector CT imaging of the chest was performed using the standard protocol during bolus administration of intravenous contrast. Multiplanar CT image reconstructions and MIPs were obtained to evaluate the vascular anatomy. Multidetector CT imaging of the abdomen and pelvis was performed using the standard protocol during bolus administration of intravenous contrast. CONTRAST:  17m OMNIPAQUE IOHEXOL 300 MG/ML SOLN, 844mISOVUE-370 IOPAMIDOL (ISOVUE-370) INJECTION 76% COMPARISON:  CT scan of Aug 31, 2017. FINDINGS: CTA CHEST FINDINGS Cardiovascular: Satisfactory opacification of the pulmonary arteries to the segmental level. No evidence of pulmonary embolism. Normal heart size. No pericardial effusion. Mediastinum/Nodes: No enlarged mediastinal, hilar, or axillary lymph nodes. Thyroid gland, trachea, and esophagus demonstrate no significant findings. Lungs/Pleura: Lungs are clear. No pleural effusion or pneumothorax. Musculoskeletal: No chest wall abnormality. No acute or significant osseous findings. Review of the MIP images confirms the above findings. CT ABDOMEN and PELVIS FINDINGS Hepatobiliary:  No gallstones are noted. Stable left hepatic low density. No biliary dilatation is noted. Pancreas: Unremarkable. No pancreatic ductal dilatation or surrounding inflammatory changes. Spleen: Normal in size without focal abnormality. Adrenals/Urinary Tract: Adrenal glands are unremarkable. Kidneys are normal, without renal calculi, focal lesion, or hydronephrosis. Bladder is unremarkable. Stomach/Bowel: Stomach appears normal. The appendix appears normal. 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 Postsurgical changes are noted in the rectal region. No definite bowel dilatation is noted. Vascular/Lymphatic: No significant vascular findings are present. No enlarged abdominal or pelvic lymph nodes. Reproductive: Prostate is not well visualized. Other: No abdominal wall hernia or abnormality. No abdominopelvic ascites. Musculoskeletal: No acute or significant osseous findings. Review of the MIP images confirms the above findings. IMPRESSION: No definite evidence of pulmonary embolus. Postsurgical changes are noted in the rectal region. 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 No abnormal bowel dilatation is noted. Electronically Signed   By: JaMarijo ConceptionM.D.   On: 09/11/2017 20:58   Ct Abdomen Pelvis W Contrast  Result Date: 09/11/2017 CLINICAL DATA:  Acute generalized abdominal pain. History of rectal cancer. EXAM: CT ANGIOGRAPHY CHEST CT ABDOMEN AND PELVIS WITH CONTRAST TECHNIQUE: Multidetector CT imaging of the chest was performed using the standard protocol during bolus administration of intravenous contrast. Multiplanar CT image reconstructions and MIPs were obtained to evaluate the vascular anatomy. Multidetector CT imaging of the abdomen and pelvis was performed using the standard protocol during bolus administration of intravenous contrast.  CONTRAST:  75m OMNIPAQUE IOHEXOL 300 MG/ML SOLN, 838mISOVUE-370 IOPAMIDOL (ISOVUE-370) INJECTION 76% COMPARISON:  CT scan of Aug 31, 2017. FINDINGS: CTA CHEST  FINDINGS Cardiovascular: Satisfactory opacification of the pulmonary arteries to the segmental level. No evidence of pulmonary embolism. Normal heart size. No pericardial effusion. Mediastinum/Nodes: No enlarged mediastinal, hilar, or axillary lymph nodes. Thyroid gland, trachea, and esophagus demonstrate no significant findings. Lungs/Pleura: Lungs are clear. No pleural effusion or pneumothorax. Musculoskeletal: No chest wall abnormality. No acute or significant osseous findings. Review of the MIP images confirms the above findings. CT ABDOMEN and PELVIS FINDINGS Hepatobiliary: No gallstones are noted. Stable left hepatic low density. No biliary dilatation is noted. Pancreas: Unremarkable. No pancreatic ductal dilatation or surrounding inflammatory changes. Spleen: Normal in size without focal abnormality. Adrenals/Urinary Tract: Adrenal glands are unremarkable. Kidneys are normal, without renal calculi, focal lesion, or hydronephrosis. Bladder is unremarkable. Stomach/Bowel: Stomach appears normal. The appendix appears normal. 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 Postsurgical changes are noted in the rectal region. No definite bowel dilatation is noted. Vascular/Lymphatic: No significant vascular findings are present. No enlarged abdominal or pelvic lymph nodes. Reproductive: Prostate is not well visualized. Other: No abdominal wall hernia or abnormality. No abdominopelvic ascites. Musculoskeletal: No acute or significant osseous findings. Review of the MIP images confirms the above findings. IMPRESSION: No definite evidence of pulmonary embolus. Postsurgical changes are noted in the rectal region. 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 No abnormal bowel dilatation is noted. Electronically Signed   By: JaMarijo ConceptionM.D.   On: 09/11/2017 20:58    Pending Labs Unresulted Labs (From admission, onward)   Start     Ordered   09/19/17 0500  Creatinine, serum  (enoxaparin (LOVENOX)    CrCl >/= 30  ml/min)  Weekly,   R    Comments:  while on enoxaparin therapy    09/12/17 0003   09/12/17 [id number] metabolic panel  Tomorrow morning,   R     09/12/17 0003   09/12/17 0500  CBC  Tomorrow morning,   R     09/12/17 0003   09/12/17 0500  Hepatic function panel  Tomorrow morning,   R     09/12/17 0003   09/12/17 0500  Magnesium  Tomorrow morning,   R     09/12/17 0003   09/12/17 0500  Cortisol  Tomorrow morning,   R     09/12/17 0003   09/12/17 0002  CBC  (enoxaparin (LOVENOX)    CrCl >/= 30 ml/min)  Once,   R    Comments:  Baseline for enoxaparin therapy IF NOT ALREADY DRAWN.  Notify MD if PLT < 100 K.    09/12/17 0003   09/12/17 0002  Creatinine, serum  (enoxaparin (LOVENOX)    CrCl >/= 30 ml/min)  Once,   R    Comments:  Baseline for enoxaparin therapy IF NOT ALREADY DRAWN.    09/12/17 0003   09/11/17 1713  Culture, blood (routine x 2)  BLOOD CULTURE X 2,   STAT     09/11/17 1712      Vitals/Pain Today's Vitals   09/11/17 2230 09/11/17 2330 09/12/17 0000 09/12/17 0030  BP: 91/62 101/69 90/63 (!) 80/51  Pulse: (!) 102     Resp: _0 Temp:      TempSrc:      SpO2: 99% 99% 97% 100%  PainSc:        Isolation Precautions No active isolations  Medications Medications  iopamidol (ISOVUE-370) 76 % injection (has no administration in time range)  fentaNYL (SUBLIMAZE) injection 25 mcg (25 mcg Intravenous Given 09/12/17 0046)  methocarbamol (ROBAXIN) tablet 1,000 mg (has no administration in time range)  ondansetron (ZOFRAN) tablet 4 mg ( Oral See Alternative 09/12/17 0106)    Or  ondansetron (ZOFRAN) injection 4 mg (4 mg Intravenous Given 09/12/17 0106)  enoxaparin (LOVENOX) injection 40 mg (has no administration in time range)  0.9 %  sodium chloride infusion (has no administration in time range)  magnesium sulfate IVPB 2 g 50 mL (has no administration in time range)  sodium chloride 0.9 % bolus 1,000 mL (0 mLs Intravenous Stopped 09/11/17 1854)  sodium chloride  0.9 % bolus 1,000 mL (0 mLs Intravenous Stopped 09/11/17 1854)  fentaNYL (SUBLIMAZE) injection 25 mcg (25 mcg Intravenous Given 09/11/17 1908)  fentaNYL (SUBLIMAZE) injection 25 mcg (25 mcg Intravenous Given 09/11/17 2037)  ondansetron (ZOFRAN) injection 4 mg (4 mg Intravenous Given 09/11/17 2037)  sodium chloride 0.9 % bolus 500 mL (0 mLs Intravenous Stopped 09/11/17 2321)  iopamidol (ISOVUE-370) 76 % injection 100 mL (80 mLs Intravenous Contrast Given 09/11/17 2017)  iohexol (OMNIPAQUE) 300 MG/ML solution 30 mL (30 mLs Oral Contrast Given 09/11/17 1946)  fentaNYL (SUBLIMAZE) injection 25 mcg (25 mcg Intravenous Given 09/11/17 2234)

## 2017-09-13 DIAGNOSIS — C20 Malignant neoplasm of rectum: Secondary | ICD-10-CM | POA: Diagnosis not present

## 2017-09-13 DIAGNOSIS — I9589 Other hypotension: Secondary | ICD-10-CM | POA: Diagnosis not present

## 2017-09-13 DIAGNOSIS — E860 Dehydration: Secondary | ICD-10-CM | POA: Diagnosis not present

## 2017-09-13 LAB — HEPATITIS PANEL, ACUTE
HCV Ab: <0.1 s/co ratio (ref 0.0–0.9)
HEP A IGM: NEGATIVE
HEP B C IGM: NEGATIVE
Hepatitis B Surface Ag: NEGATIVE

## 2017-09-13 LAB — COMPREHENSIVE METABOLIC PANEL
ALK PHOS: 134 U/L — AB (ref 38–126)
ALT: 97 U/L — AB (ref 17–63)
AST: 39 U/L (ref 15–41)
Albumin: 3 g/dL — ABNORMAL LOW (ref 3.5–5.0)
Anion gap: 8 (ref 5–15)
BILIRUBIN TOTAL: 0.8 mg/dL (ref 0.3–1.2)
BUN: 7 mg/dL (ref 6–20)
CALCIUM: 8.2 mg/dL — AB (ref 8.9–10.3)
CO2: 23 mmol/L (ref 22–32)
CREATININE: 0.74 mg/dL (ref 0.61–1.24)
Chloride: 107 mmol/L (ref 101–111)
Glucose, Bld: 95 mg/dL (ref 65–99)
Potassium: 3.6 mmol/L (ref 3.5–5.1)
Sodium: 138 mmol/L (ref 135–145)
Total Protein: 6.2 g/dL — ABNORMAL LOW (ref 6.5–8.1)

## 2017-09-13 LAB — CBC WITH DIFFERENTIAL/PLATELET
BASOS ABS: 0 10*3/uL (ref 0.0–0.1)
Basophils Relative: 1 %
EOS PCT: 2 %
Eosinophils Absolute: 0.1 10*3/uL (ref 0.0–0.7)
HEMATOCRIT: 30.9 % — AB (ref 39.0–52.0)
Hemoglobin: 10.2 g/dL — ABNORMAL LOW (ref 13.0–17.0)
LYMPHS PCT: 26 %
Lymphs Abs: 1.5 10*3/uL (ref 0.7–4.0)
MCH: 28.5 pg (ref 26.0–34.0)
MCHC: 33 g/dL (ref 30.0–36.0)
MCV: 86.3 fL (ref 78.0–100.0)
Monocytes Absolute: 0.7 10*3/uL (ref 0.1–1.0)
Monocytes Relative: 12 %
NEUTROS ABS: 3.4 10*3/uL (ref 1.7–7.7)
Neutrophils Relative %: 59 %
Platelets: 471 10*3/uL — ABNORMAL HIGH (ref 150–400)
RBC: 3.58 MIL/uL — AB (ref 4.22–5.81)
RDW: 14.6 % (ref 11.5–15.5)
WBC: 5.8 10*3/uL (ref 4.0–10.5)

## 2017-09-13 LAB — ACTH STIMULATION, 3 TIME POINTS
Cortisol, 30 Min: 16.1 ug/dL
Cortisol, 60 Min: 18.6 ug/dL
Cortisol, Base: 7.2 ug/dL

## 2017-09-13 LAB — LACTIC ACID, PLASMA: LACTIC ACID, VENOUS: 0.6 mmol/L (ref 0.5–1.9)

## 2017-09-13 LAB — MAGNESIUM: MAGNESIUM: 1.7 mg/dL (ref 1.7–2.4)

## 2017-09-13 MED ORDER — PANTOPRAZOLE SODIUM 40 MG PO TBEC: 40.0000 mg | DELAYED_RELEASE_TABLET | Freq: Two times a day (BID) | ORAL | Status: DC

## 2017-09-13 MED ORDER — SODIUM CHLORIDE 0.9% FLUSH
10.0000 mL | INTRAVENOUS | Status: DC | PRN
  Administered 2017-09-13: 10 mL
  Filled 2017-09-13: qty 40

## 2017-09-13 MED ORDER — GABAPENTIN 300 MG PO CAPS: 300.0000 mg | ORAL_CAPSULE | Freq: Three times a day (TID) | ORAL | 0 refills | Status: AC

## 2017-09-13 MED ORDER — HEPARIN SOD (PORK) LOCK FLUSH 100 UNIT/ML IV SOLN
250.0000 [IU] | INTRAVENOUS | Status: AC | PRN
  Administered 2017-09-13: 250 [IU]

## 2017-09-13 MED ORDER — OXYCODONE HCL 5 MG PO TABS: 5.0000 mg | ORAL_TABLET | ORAL | 0 refills | Status: DC | PRN

## 2017-09-13 MED ORDER — OXYCODONE-ACETAMINOPHEN 5-325 MG PO TABS
1.0000 | ORAL_TABLET | ORAL | Status: DC | PRN
  Administered 2017-09-13: 2 via ORAL
  Filled 2017-09-13: qty 2

## 2017-09-13 MED FILL — GABAPENTIN 300 MG CAPSULE: 300 | 30 days supply | Qty: 90 | Fill #0

## 2017-09-13 MED FILL — oxyCODONE HCL 5 MG TABS: 5 | 5 days supply | Qty: 30 | Fill #0

## 2017-09-13 NOTE — Progress Notes (Signed)
Went over d/c instructions with patient.  He verbalized understanding.  Left hospital with AVS, hard script via w/c.  Virginia Rochester, RN

## 2017-09-13 NOTE — Progress Notes (Addendum)
Hypotension  Subjective: PICC in place  Objective: Vital signs in last 24 hours: Temp:  [97.9 F (36.6 C)-98.3 F (36.8 C)] 97.9 F (36.6 C) (05/29 0352) Pulse Rate:  [78-93] 78 (05/29 0352) Resp:  [16-19] 18 (05/29 0352) BP: (90-102)/(55-66) 93/55 (05/29 0352) SpO2:  [98 %-100 %] 98 % (05/29 0352) Last BM Date: 09/12/17  Intake/Output from previous day: 05/28 0701 - 05/29 0700 In: 3780 [P.O.:1040; I.V.:2740] Out: 3950 [Urine:1300; Stool:2650] Intake/Output this shift: Total I/O In: -  Out: 325 [Urine:325]  General appearance: alert and cooperative GI: normal findings: soft, non-tender  Lab Results:  Results for orders placed or performed during the hospital encounter of 09/11/17 (from the past 24 hour(s))  Hepatitis panel, acute     Status: None   Collection Time: 09/12/17  9:37 AM  Result Value Ref Range   Hepatitis B Surface Ag Negative Negative   HCV Ab <0.1 0.0 - 0.9 s/co ratio   Hep A IgM Negative Negative   Hep B C IgM Negative Negative  Acetaminophen level     Status: Abnormal   Collection Time: 09/12/17  9:37 AM  Result Value Ref Range   Acetaminophen (Tylenol), Serum <10 (L) 10 - 30 ug/mL  CBC with Differential/Platelet     Status: Abnormal   Collection Time: 09/13/17  6:36 AM  Result Value Ref Range   WBC 5.8 4.0 - 10.5 K/uL   RBC 3.58 (L) 4.22 - 5.81 MIL/uL   Hemoglobin 10.2 (L) 13.0 - 17.0 g/dL   HCT 30.9 (L) 39.0 - 52.0 %   MCV 86.3 78.0 - 100.0 fL   MCH 28.5 26.0 - 34.0 pg   MCHC 33.0 30.0 - 36.0 g/dL   RDW 14.6 11.5 - 15.5 %   Platelets 471 (H) 150 - 400 K/uL   Neutrophils Relative % 59 %   Neutro Abs 3.4 1.7 - 7.7 K/uL   Lymphocytes Relative 26 %   Lymphs Abs 1.5 0.7 - 4.0 K/uL   Monocytes Relative 12 %   Monocytes Absolute 0.7 0.1 - 1.0 K/uL   Eosinophils Relative 2 %   Eosinophils Absolute 0.1 0.0 - 0.7 K/uL   Basophils Relative 1 %   Basophils Absolute 0.0 0.0 - 0.1 K/uL  Comprehensive metabolic panel     Status: Abnormal   Collection Time: 09/13/17  6:36 AM  Result Value Ref Range   Sodium 138 135 - 145 mmol/L   Potassium 3.6 3.5 - 5.1 mmol/L   Chloride 107 101 - 111 mmol/L   CO2 23 22 - 32 mmol/L   Glucose, Bld 95 65 - 99 mg/dL   BUN 7 6 - 20 mg/dL   Creatinine, Ser 0.74 0.61 - 1.24 mg/dL   Calcium 8.2 (L) 8.9 - 10.3 mg/dL   Total Protein 6.2 (L) 6.5 - 8.1 g/dL   Albumin 3.0 (L) 3.5 - 5.0 g/dL   AST 39 15 - 41 U/L   ALT 97 (H) 17 - 63 U/L   Alkaline Phosphatase 134 (H) 38 - 126 U/L   Total Bilirubin 0.8 0.3 - 1.2 mg/dL   GFR calc non Af Amer >60 >60 mL/min   GFR calc Af Amer >60 >60 mL/min   Anion gap 8 5 - 15  Magnesium     Status: None   Collection Time: 09/13/17  6:36 AM  Result Value Ref Range   Magnesium 1.7 1.7 - 2.4 mg/dL  Lactic acid, plasma     Status: None   Collection Time:  09/13/17  6:36 AM  Result Value Ref Range   Lactic Acid, Venous 0.6 0.5 - 1.9 mmol/L     Studies/Results Radiology     MEDS, Scheduled . ciprofloxacin  500 mg Oral BID  . enoxaparin (LOVENOX) injection  40 mg Subcutaneous Q24H  . feeding supplement  1 Container Oral BID BM  . gabapentin  300 mg Oral TID  . lidocaine  1 patch Transdermal Q24H  . methocarbamol  500 mg Oral TID  . pantoprazole  40 mg Oral BID AC  . protein supplement  8 oz Oral Q2000     Assessment: Hypotension High output ileostomy  Plan: Cannot use anti-motility agents due to pt's pSBO.  Will have PICC placed with IVF administration twice a week at home.  Give 2L 0.9NS over 3 hrs twice weekly (Tues and Fri)   Central line infection: cont cipro for 4 more days. Pt has this at pharmacy.  Will remove sutures prior to d/c home  Hopefully pt can go home today.  Home health should already be in place  Rx for pain meds and neurontin refill in chart.  Pt aware he should be weaning off his meds.      LOS: 0 days    Rosario Adie, MD [facility], [place]   09/13/2017 8:50 AM

## 2017-09-13 NOTE — Care Management Note (Signed)
Case Management Note  Patient Details  Name: Jacob Galvan. MRN: [id number] Date of Birth: 1975/10/24  Subjective/Objective:                  Home health orders- iv flds 0.9% ns 1000cc over 3 hours twice a week Tuesday and fridays   Action/Plan: [facility] to [facility] with [facility]  Expected Discharge Date:                  Expected Discharge Plan:  [place]  In-House Referral:     Discharge planning Services  CM Consult  Post Acute Care Choice:  Durable Medical Equipment, Home Health Choice offered to:  NA  DME Arranged:  IV pump/equipment DME Agency:  [place]:  RN [facility] Agency:  [place]  Status of Service:  In process, will continue to follow  If discussed at Long Length of Stay Meetings, dates discussed:    Additional Comments:  Leeroy Cha, RN 09/13/2017, 9:55 AM

## 2017-09-13 NOTE — Discharge Summary (Addendum)
Physician Discharge Summary  Jacob Galvan.  HCW:[id number]  DOB: 08-Dec-1975  DOA: 09/11/2017 PCP: Patient, No Pcp Per  Admit date: 09/11/2017 Discharge date: 09/13/2017  Admitted From: Home  Disposition:  Home   Recommendations for Outpatient Follow-up:  1. Follow up with PCP in 1 week  2. Please obtain BMP/CBC in one week to monitor hgb and renal function  3. Follow-up with surgery in 2 weeks  Home Health: RN administered 2 L of NS twice weekly Equipment/Devices: PICC line   Discharge Condition: Stable  CODE STATUS: Full Code  Diet recommendation:  Regular   Brief/Interim Summary: For full details see H&P/Progress note, but in brief, Jacob Galvan. 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  He was recently discharged on 5/27 2019 after being admitted for bowel obstruction.  Patient presented to the emergency department complaining of persistent weakness, abdominal pain and presyncope.  Patient was admitted with working diagnosis of hypotension.  Patient was found to have high output ileostomy.  Surgical team was consulted.  Patient was treated with IV fluid and responded well.  Surgical team recommending IV fluids at home with a PICC line.  Cortisol levels were checked which was low normal, ACTH stimulation test also low normal.  Patient has clinically improved tolerating diet well.  Ambulating with no issues.  Remains afebrile and surgical team has cleared for discharge.  Patient deemed stable to be discharged home and follow-up with primary care doctor and surgical team as an outpatient.  Subjective: Patient seen and examined, has no complaints this morning.  Reports abdominal pain is improved.  Continues with high output at ileostomy bag.  Remains afebrile.  No acute events overnight.  Discharge Diagnoses/Hospital Course:  Hypotension Unable to determine clinically ?  High output ileostomy, causing  dehydration. Patient responded okay to IV fluids.  Given continue high output ileostomy recommended to have IV fluids twice weekly via PICC line.  High output ileostomy Surgical recommendations appreciated, no surgical intervention at this time.  Recommending IV fluids twice weekly via PICC line.  Home health has been arranged.  Central line infection Status post Port-A-Cath removal on 09/05/2017 Klebsiella bacteremia On ciprofloxacin, last day of antibiotic therapy  09/17/2017  Abdominal pain Felt to be postop sequela, however patient is with signs of drug-seeking as he was asking for Dilaudid and Phenergan.  Discussed tapering down narcotic therapy.  For now continue Percocet along with scheduled gabapentin and Robaxin.  Surgical team has refill his medications.  GERD Continue PPI twice daily  All other chronic medical condition were stable during the hospitalization.  On the day of the discharge the patient's vitals were stable, and no other acute medical condition were reported by patient. the patient was felt safe to be discharge to home  Discharge Instructions  You were cared for by a hospitalist during your hospital stay. If you have any questions about your discharge medications or the care you received while you were in the hospital after you are discharged, you can call the unit and asked to speak with the hospitalist on call if the hospitalist that took care of you is not available. Once you are discharged, your primary care physician will handle any further medical issues. Please note that NO REFILLS for any discharge medications will be authorized once you are discharged, as it is imperative that you return to your primary care physician (or establish a relationship with a primary care physician  if you do not have one) for your aftercare needs so that they can reassess your need for medications and monitor your lab values.   Allergies as of 09/13/2017   No Known Allergies      Medication List    STOP taking these medications   ondansetron 4 MG tablet Commonly known as:  ZOFRAN   sodium chloride 0.9 %     TAKE these medications   ciprofloxacin 500 MG tablet Commonly known as:  CIPRO Take 1 tablet (500 mg total) by mouth 2 (two) times daily.   gabapentin 300 MG capsule Commonly known as:  NEURONTIN Take 1 capsule (300 mg total) by mouth 3 (three) times daily. What changed:    medication strength  how much to take   ibuprofen 200 MG tablet Commonly known as:  ADVIL,MOTRIN Take 400 mg by mouth as needed for moderate pain.   methocarbamol 500 MG tablet Commonly known as:  ROBAXIN Take 2 tablets (1,000 mg total) by mouth every 6 (six) hours as needed for muscle spasms.   oxyCODONE 5 MG immediate release tablet Commonly known as:  Oxy IR/ROXICODONE Take 1 tablet (5 mg total) by mouth every 4 (four) hours as needed for moderate pain, severe pain or breakthrough pain. What changed:  how much to take   pantoprazole 40 MG tablet Commonly known as:  PROTONIX Take 1 tablet (40 mg total) by mouth 2 (two) times daily.       No Known Allergies  Consultations:  General surgery   Procedures/Studies: Dg Chest 2 View  Result Date: 09/11/2017 CLINICAL DATA:  Shortness of breath.  Tachycardia.  Weakness. EXAM: CHEST - 2 VIEW COMPARISON:  09/03/2017. FINDINGS: Normal sized heart. Clear lungs. The right jugular porta catheter has been removed. Stable exostosis arising from the medial aspect of the proximal humeral shaft. IMPRESSION: No acute abnormality. Electronically Signed   By: Claudie Revering M.D.   On: 09/11/2017 18:09   Ct Angio Chest Pe W/cm &/or Wo Cm  Result Date: 09/11/2017 CLINICAL DATA:  Acute generalized abdominal pain. History of rectal cancer. EXAM: CT ANGIOGRAPHY CHEST CT ABDOMEN AND PELVIS WITH CONTRAST TECHNIQUE: Multidetector CT imaging of the chest was performed using the standard protocol during bolus administration of intravenous  contrast. Multiplanar CT image reconstructions and MIPs were obtained to evaluate the vascular anatomy. Multidetector CT imaging of the abdomen and pelvis was performed using the standard protocol during bolus administration of intravenous contrast. CONTRAST:  53mL OMNIPAQUE IOHEXOL 300 MG/ML SOLN, 79mL ISOVUE-370 IOPAMIDOL (ISOVUE-370) INJECTION 76% COMPARISON:  CT scan of Aug 31, 2017. FINDINGS: CTA CHEST FINDINGS Cardiovascular: Satisfactory opacification of the pulmonary arteries to the segmental level. No evidence of pulmonary embolism. Normal heart size. No pericardial effusion. Mediastinum/Nodes: No enlarged mediastinal, hilar, or axillary lymph nodes. Thyroid gland, trachea, and esophagus demonstrate no significant findings. Lungs/Pleura: Lungs are clear. No pleural effusion or pneumothorax. Musculoskeletal: No chest wall abnormality. No acute or significant osseous findings. Review of the MIP images confirms the above findings. CT ABDOMEN and PELVIS FINDINGS Hepatobiliary: No gallstones are noted. Stable left hepatic low density. No biliary dilatation is noted. Pancreas: Unremarkable. No pancreatic ductal dilatation or surrounding inflammatory changes. Spleen: Normal in size without focal abnormality. Adrenals/Urinary Tract: Adrenal glands are unremarkable. Kidneys are normal, without renal calculi, focal lesion, or hydronephrosis. Bladder is unremarkable. Stomach/Bowel: Stomach appears normal. The appendix appears normal. 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 Postsurgical changes are noted in the rectal region. No definite bowel dilatation is  noted. Vascular/Lymphatic: No significant vascular findings are present. No enlarged abdominal or pelvic lymph nodes. Reproductive: Prostate is not well visualized. Other: No abdominal wall hernia or abnormality. No abdominopelvic ascites. Musculoskeletal: No acute or significant osseous findings. Review of the MIP images confirms the above findings. IMPRESSION:  No definite evidence of pulmonary embolus. Postsurgical changes are noted in the rectal region. 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 No abnormal bowel dilatation is noted. Electronically Signed   By: Marijo Conception, M.D.   On: 09/11/2017 20:58   Ct Abdomen Pelvis W Contrast  Result Date: 09/11/2017 CLINICAL DATA:  Acute generalized abdominal pain. History of rectal cancer. EXAM: CT ANGIOGRAPHY CHEST CT ABDOMEN AND PELVIS WITH CONTRAST TECHNIQUE: Multidetector CT imaging of the chest was performed using the standard protocol during bolus administration of intravenous contrast. Multiplanar CT image reconstructions and MIPs were obtained to evaluate the vascular anatomy. Multidetector CT imaging of the abdomen and pelvis was performed using the standard protocol during bolus administration of intravenous contrast. CONTRAST:  31mL OMNIPAQUE IOHEXOL 300 MG/ML SOLN, 28mL ISOVUE-370 IOPAMIDOL (ISOVUE-370) INJECTION 76% COMPARISON:  CT scan of Aug 31, 2017. FINDINGS: CTA CHEST FINDINGS Cardiovascular: Satisfactory opacification of the pulmonary arteries to the segmental level. No evidence of pulmonary embolism. Normal heart size. No pericardial effusion. Mediastinum/Nodes: No enlarged mediastinal, hilar, or axillary lymph nodes. Thyroid gland, trachea, and esophagus demonstrate no significant findings. Lungs/Pleura: Lungs are clear. No pleural effusion or pneumothorax. Musculoskeletal: No chest wall abnormality. No acute or significant osseous findings. Review of the MIP images confirms the above findings. CT ABDOMEN and PELVIS FINDINGS Hepatobiliary: No gallstones are noted. Stable left hepatic low density. No biliary dilatation is noted. Pancreas: Unremarkable. No pancreatic ductal dilatation or surrounding inflammatory changes. Spleen: Normal in size without focal abnormality. Adrenals/Urinary Tract: Adrenal glands are unremarkable. Kidneys are normal, without renal calculi, focal lesion, or hydronephrosis.  Bladder is unremarkable. Stomach/Bowel: Stomach appears normal. The appendix appears normal. 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 Postsurgical changes are noted in the rectal region. No definite bowel dilatation is noted. Vascular/Lymphatic: No significant vascular findings are present. No enlarged abdominal or pelvic lymph nodes. Reproductive: Prostate is not well visualized. Other: No abdominal wall hernia or abnormality. No abdominopelvic ascites. Musculoskeletal: No acute or significant osseous findings. Review of the MIP images confirms the above findings. IMPRESSION: No definite evidence of pulmonary embolus. Postsurgical changes are noted in the rectal region. 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 No abnormal bowel dilatation is noted. Electronically Signed   By: Marijo Conception, M.D.   On: 09/11/2017 20:58   Ct Abdomen Pelvis W Contrast  Result Date: 08/31/2017 CLINICAL DATA:  Acute onset of generalized abdominal pain, nausea and vomiting. EXAM: CT ABDOMEN AND PELVIS WITH CONTRAST TECHNIQUE: Multidetector CT imaging of the abdomen and pelvis was performed using the standard protocol following bolus administration of intravenous contrast. CONTRAST:  126mL ISOVUE-300 IOPAMIDOL (ISOVUE-300) INJECTION 61% COMPARISON:  None. FINDINGS: Lower chest: Minimal bibasilar atelectasis is noted. Scattered coronary artery calcification is noted. Wall thickening at the distal esophagus may reflect chronic inflammation or esophagitis. Hepatobiliary: A 1.5 cm hypodensity is noted at the hepatic dome. The liver is otherwise unremarkable. The gallbladder is not well assessed given surrounding ascites. The common bile duct remains normal in caliber. Pancreas: The pancreas is within normal limits. Spleen: The spleen is unremarkable in appearance. Adrenals/Urinary Tract: The adrenal glands are unremarkable in appearance. The kidneys are within normal limits. There is no evidence of  hydronephrosis. No renal or  ureteral stones are identified. No perinephric stranding is seen. Stomach/Bowel: The patient is status post diverting loop ileostomy at the terminal ileum. There is diffuse distention of small-bowel loops to 5.6 cm in maximal diameter, with multiple transition points at the pelvis due to extensive scarring and adhesions. Findings are compatible with relatively high-grade small bowel obstruction. The colon is decompressed. Postoperative change is noted at the level of the rectum. Trace fluid about the appendix likely tracks from the small bowel process. The appendix is grossly unremarkable in appearance. The stomach is partially filled with fluid and grossly unremarkable. Vascular/Lymphatic: The abdominal aorta is unremarkable in appearance. The inferior vena cava is grossly unremarkable. Mildly prominent retroperitoneal nodes measure up to 1.0 cm in short axis. No pelvic sidewall lymphadenopathy is identified. Reproductive: The prostate is not well characterized. The bladder is mildly distended and grossly unremarkable. Other: No additional soft tissue abnormalities are seen. Musculoskeletal: No acute osseous abnormalities are identified. The visualized musculature is unremarkable in appearance. IMPRESSION: 1. Diffuse distention of small-bowel loops to 5.6 cm in maximal diameter, with multiple transition points at the pelvis due to extensive scarring and adhesions. Findings compatible with relatively high-grade small bowel obstruction. 2. Wall thickening at the distal esophagus may reflect chronic inflammation or esophagitis. 3. Diverting loop ileostomy at the terminal ileum is unremarkable in appearance. The colon is decompressed. 4. Scattered coronary artery calcifications seen. 5. 1.5 cm nonspecific hypodensity at the hepatic dome. These results were called by telephone at the time of interpretation on 08/31/2017 at 5:31 am to [facility] PA, who verbally acknowledged these results. Electronically Signed   By:  Garald Balding M.D.   On: 08/31/2017 05:33   Ct Abdomen Pelvis W Contrast  Result Date: 08/21/2017 CLINICAL DATA:  Bloating and abdominal pain. Approximately 1 month out from robotic associated LAR for rectal cancer. Loop ileostomy. EXAM: CT ABDOMEN AND PELVIS WITH CONTRAST TECHNIQUE: Multidetector CT imaging of the abdomen and pelvis was performed using the standard protocol following bolus administration of intravenous contrast. CONTRAST:  143mL OMNIPAQUE IOHEXOL 300 MG/ML  SOLN COMPARISON:  08/08/2017 FINDINGS: Lower chest: Unremarkable. Hepatobiliary: Similar appearance 13 mm hypoattenuating lesion in the lateral segment left liver. Mild prominence of the intrahepatic bile ducts mainly in segment IV is not substantially changed. Other scattered tiny hypodensities in the right hepatic lobe are stable. There is no evidence for gallstones, gallbladder wall thickening, or pericholecystic fluid. Common duct is nondilated. Pancreas: No focal mass lesion. No dilatation of the main duct. No intraparenchymal cyst. No peripancreatic edema. Spleen: No splenomegaly. No focal mass lesion. Adrenals/Urinary Tract: No adrenal nodule or mass. Tiny hypoattenuating lesion in the interpolar right kidney is too small to characterize. Left kidney unremarkable. No evidence for hydroureter. The urinary bladder appears normal for the degree of distention. Stomach/Bowel: Stomach is mildly distended. Duodenum is nondistended. The degree of small bowel distension has progressed in the interval since prior study. Proximal small bowel loops could be measured up to 4 cm diameter. More distal unopacified small bowel loops measure up to 4.5 cm diameter. Fecalization of small bowel contents is evident in nondistended small bowel in the deep central pelvis. Small bowel in the central pelvis appears matted together as before and a small enhancing focus (image 80/series 2) in this region of clustered small bowel is indeterminate. Distal ileum  tracking into the loop ileostomy is not well demonstrated given the marked small bowel dilatation. The appendix is visualized as normal features. There  is some fluid in the right colon although the colon is largely decompressed. Vascular/Lymphatic: No abdominal aortic aneurysm. No abdominal aortic atherosclerotic calcification. No gastrohepatic or hepato duodenal ligament lymphadenopathy. Small para-aortic lymph nodes are similar to prior. 10 mm short axis left para-aortic lymph node measured on the previous exam is 9 mm today (40/2). No pelvic sidewall lymphadenopathy. Reproductive: The prostate gland and seminal vesicles have normal imaging features. Other: Rim enhancing fluid collection posterior to the distal colon near the anastomosis is not substantially changed in the interval measuring 4.3 x 2.0 cm today compared to 4.8 x 1.9 cm previously. Musculoskeletal: Bone windows reveal no worrisome lytic or sclerotic osseous lesions. IMPRESSION: 1. Some interval progression of the diffuse small bowel dilatation with distal small bowel loops now measuring up to 4.5 cm in diameter. Overall imaging features suggest obstruction although patient reportedly has high stoma output. Small bowel tapers into a matted cluster of small bowel in the central pelvis compatible with adhesions. Fecalization of small bowel contents in this region of clustered small bowel is consistent with decreased transit. 2. Although the distal ileum tracking into the ileostomy is not well visualized, there is no evidence for dilated small bowel tracking into the stoma. Colon is largely decompressed. 3. Similar appearance of the rim enhancing fluid collection seen just posterior to the rectum. Electronically Signed   By: Misty Stanley M.D.   On: 08/21/2017 15:45   Dg Abd 2 Views  Result Date: 08/20/2017 CLINICAL DATA:  Small bowel obstruction. History of LAR and diverting ileostomy for rectal cancer. EXAM: ABDOMEN - 2 VIEW COMPARISON:  08/01/2017  abdominal radiograph. 08/08/2017 CT abdomen/pelvis. FINDINGS: Ostomy device overlies the right abdomen. There is marked diffuse small bowel dilatation up to 7.8 cm diameter, increased from 6.2 cm on 08/01/2017 radiograph and 4.4 cm on 08/08/2017 CT topogram. Fluid levels throughout the dilated small bowel loops. No evidence of pneumatosis or pneumoperitoneum. No radiopaque nephrolithiasis. Clear lung bases. IMPRESSION: Marked diffuse small bowel dilatation with air-fluid levels, worsened since 08/08/2017 CT and 08/01/2017 radiograph, most compatible with worsening distal small bowel obstruction. Electronically Signed   By: Ilona Sorrel M.D.   On: 08/20/2017 14:11   Dg Abd Acute W/chest  Result Date: 09/03/2017 CLINICAL DATA:  Abdominal pain.  Rectal carcinoma.  Loop ileostomy EXAM: DG ABDOMEN ACUTE W/ 1V CHEST COMPARISON:  Radiograph 09/02/2017, CT 08/31/2017 FINDINGS: Port in the anterior chest wall with tip in distal SVC. Lungs are clear. No free air beneath hemidiaphragms Marked decrease in small bowel distension seen on comparison exam. Previous loops of small bowel measured up to 7 cm and now are not evident. Single dilated loop of small bowel does remain in the RIGHT lower quadrant unchanged. RIGHT lower quadrant ileostomy noted. IMPRESSION: 1. Considerable improvement in small bowel obstruction pattern seen on comparison exam. 2. No intraperitoneal free air. Electronically Signed   By: Suzy Bouchard M.D.   On: 09/03/2017 10:46   Dg Abd Portable 1v  Result Date: 09/02/2017 CLINICAL DATA:  Small bowel obstruction, rectal cancer. EXAM: PORTABLE ABDOMEN - 1 VIEW COMPARISON:  CT abdomen pelvis 08/31/2017 and abdominal x-ray 08/29/2017. FINDINGS: Marked gaseous distension of small bowel, measuring up to 7.2 cm in diameter. No definite colonic gas. IMPRESSION: High-grade small bowel obstruction. Electronically Signed   By: Lorin Picket M.D.   On: 09/02/2017 07:14   Dg Abd Portable 1v  Result Date:  08/29/2017 CLINICAL DATA:  Follow-up small bowel obstruction. EXAM: PORTABLE ABDOMEN - 1 VIEW COMPARISON:  08/20/2017 FINDINGS: Continued significant dilatation of a single bowel loop centrally in the abdomen. Right lower quadrant ostomy noted. No free air organomegaly. IMPRESSION: Continued significant gaseous distention of a single bowel loop in the central abdomen, presumably small bowel. Electronically Signed   By: Rolm Baptise M.D.   On: 08/29/2017 10:09   [place] Ekg Site Rite  Result Date: 09/12/2017 If Site Rite image not attached, placement could not be confirmed due to current cardiac rhythm.    Discharge Exam: Vitals:   09/12/17 2041 09/13/17 0352  BP: 96/62 (!) 93/55  Pulse: 78 78  Resp: 19 18  Temp: 98.3 F (36.8 C) 97.9 F (36.6 C)  SpO2: 100% 98%   Vitals:   09/12/17 0925 09/12/17 1411 09/12/17 2041 09/13/17 0352  BP: (!) 102/57 90/66 96/62  (!) 93/55  Pulse: 93 93 78 78  Resp: 16 16 19 18   Temp: 98.3 F (36.8 C) 98.2 F (36.8 C) 98.3 F (36.8 C) 97.9 F (36.6 C)  TempSrc: Oral Oral  Oral  SpO2: 100% 100% 100% 98%    General: Pt is alert, awake, not in acute distress Cardiovascular: RRR, S1/S2 +, no rubs, no gallops Respiratory: CTA bilaterally, no wheezing, no rhonchi Abdominal: Soft, NT, ND, bowel sounds +  The results of significant diagnostics from this hospitalization (including imaging, microbiology, ancillary and laboratory) are listed below for reference.     Microbiology: Recent Results (from the past 240 hour(s))  Culture, blood (routine x 2)     Status: None (Preliminary result)   Collection Time: 09/11/17  5:30 PM  Result Value Ref Range Status   Specimen Description   Final    BLOOD RIGHT FOREARM Performed at [facility] [address] [place], [place] [address]    Special Requests   Final    BOTTLES DRAWN AEROBIC AND ANAEROBIC Blood Culture results may not be optimal due to an inadequate volume of blood received in  culture bottles Performed at [facility] [address], [place], [place] [address]    Culture   Final    NO GROWTH < 24 HOURS Performed at [facility] [address] [place], [place] [address]    Report Status PENDING  Incomplete  Culture, blood (routine x 2)     Status: None (Preliminary result)   Collection Time: 09/11/17  5:40 PM  Result Value Ref Range Status   Specimen Description   Final    BLOOD LEFT WRIST Performed at [facility] [address], [place], [place] [address]    Special Requests   Final    BOTTLES DRAWN AEROBIC AND ANAEROBIC Blood Culture adequate volume Performed at [facility] [address] [place], [place] [address]    Culture   Final    NO GROWTH < 24 HOURS Performed at [facility] [address] [place], [place] [address]    Report Status PENDING  Incomplete     Labs: BNP (last 3 results) No results for input(s): BNP in the last 8760 hours. Basic Metabolic Panel: Recent Labs  Lab 09/07/17 0740 09/11/17 1732 09/11/17 1737 09/11/17 1740 09/12/17 0609 09/13/17 0636  NA 138 134* 132*  --  135 138  K 3.7 3.6 4.9  --  3.6 3.6  CL 99* 92* 94*  --  101 107  CO2 28  --  23  --  25 23  GLUCOSE 104* 131* 118*  --  106* 95  BUN 5* 14 15  --  9 7  CREATININE 0.84 0.90 0.95  --  0.81 0.74  CALCIUM 8.7*  --  9.2  --  8.3* 8.2*  MG  --   --   --  1.6* 2.0 1.7   Liver Function Tests: Recent Labs  Lab 09/07/17 0740 09/11/17 1737 09/12/17 0609 09/13/17 0636  AST 48* 83* 53* 39  ALT 91* 168* 130* 97*  ALKPHOS 196* 199* 147* 134*  BILITOT 0.4 1.4* 0.9 0.8  PROT 6.8 8.2* 6.5 6.2*  ALBUMIN 2.8* 3.7 3.0* 3.0*   Recent Labs  Lab 09/11/17 1737  LIPASE 32   No results for input(s): AMMONIA in the last 168 hours. CBC: Recent Labs  Lab 09/11/17 1732 09/11/17 1737 09/12/17 0609 09/13/17 0636  WBC  --  8.7 7.3 5.8  NEUTROABS  --   --   --  3.4  HGB 15.3 13.8  11.5* 10.2*  HCT 45.0 39.6 34.3* 30.9*  MCV  --  84.8 85.5 86.3  PLT  --  624* 536* 471*   Cardiac Enzymes: Recent Labs  Lab 09/12/17 0609  TROPONINI <0.03   BNP: Invalid input(s): POCBNP CBG: Recent Labs  Lab 09/07/17 0020 09/07/17 0602 09/08/17 0032 09/08/17 0632  GLUCAP 91 102* 106* 106*   D-Dimer No results for input(s): DDIMER in the last 72 hours. Hgb A1c No results for input(s): HGBA1C in the last 72 hours. Lipid Profile No results for input(s): CHOL, HDL, LDLCALC, TRIG, CHOLHDL, LDLDIRECT in the last 72 hours. Thyroid function studies No results for input(s): TSH, T4TOTAL, T3FREE, THYROIDAB in the last 72 hours.  Invalid input(s): FREET3 Anemia work up No results for input(s): VITAMINB12, FOLATE, FERRITIN, TIBC, IRON, RETICCTPCT in the last 72 hours. Urinalysis    Component Value Date/Time   COLORURINE YELLOW 09/11/2017 2227   APPEARANCEUR CLEAR 09/11/2017 2227   LABSPEC >1.046 (H) 09/11/2017 2227   PHURINE 6.0 09/11/2017 2227   GLUCOSEU NEGATIVE 09/11/2017 2227   HGBUR NEGATIVE 09/11/2017 2227   BILIRUBINUR NEGATIVE 09/11/2017 2227   KETONESUR NEGATIVE 09/11/2017 2227   PROTEINUR NEGATIVE 09/11/2017 2227   NITRITE NEGATIVE 09/11/2017 2227   LEUKOCYTESUR NEGATIVE 09/11/2017 2227   Sepsis Labs Invalid input(s): PROCALCITONIN,  WBC,  LACTICIDVEN Microbiology Recent Results (from the past 240 hour(s))  Culture, blood (routine x 2)     Status: None (Preliminary result)   Collection Time: 09/11/17  5:30 PM  Result Value Ref Range Status   Specimen Description   Final    BLOOD RIGHT FOREARM Performed at [facility], [place] [address], [place], [place] [address]    Special Requests   Final    BOTTLES DRAWN AEROBIC AND ANAEROBIC Blood Culture results may not be optimal due to an inadequate volume of blood received in culture bottles Performed at [place] [address], [place], [place] [address]    Culture    Final    NO GROWTH < 24 HOURS Performed at [place] [address], [place], [place] [address]    Report Status PENDING  Incomplete  Culture, blood (routine x 2)     Status: None (Preliminary result)   Collection Time: 09/11/17  5:40 PM  Result Value Ref Range Status   Specimen Description   Final    BLOOD LEFT WRIST Performed at [place] [address], [place], [place] [address]    Special Requests   Final    BOTTLES DRAWN AEROBIC  AND ANAEROBIC Blood Culture adequate volume Performed at [facility] [address] [place], [place] [address]    Culture   Final    NO GROWTH < 24 HOURS Performed at [place] [address], [place], [place] [address]    Report Status PENDING  Incomplete    Time coordinating discharge:  32 minutes  SIGNED:  Chipper Oman, MD  [facility] 09/13/2017, 10:47 AM  Pager please text page via  [email or web address]  Note - This record has been created using [employer]. Chart creation errors have been sought, but may not always have been located. Such creation errors do not reflect on the standard of medical care.

## 2017-09-15 ENCOUNTER — Ambulatory Visit: Payer: Self-pay | Admitting: General Surgery

## 2017-09-16 LAB — CULTURE, BLOOD (ROUTINE X 2)
CULTURE: NO GROWTH
Culture: NO GROWTH
Special Requests: ADEQUATE

## 2017-09-22 NOTE — Progress Notes (Signed)
09-11-17 (Epic) EKG, CXR

## 2017-09-22 NOTE — Patient Instructions (Addendum)
Jacob Galvan.  09/22/2017   Your procedure is scheduled on: 09-27-17   Report to [facility] Main  Entrance    Report to Admitting at 10:45 AM    Call this number if you have problems the morning of surgery [phone]   Remember: Do not eat food or drink liquids :After Midnight.     Take these medicines the morning of surgery with A SIP OF WATER: Gabapentin (Neurontin) and Pantoprazole (Protonix), and pain medication as needed.                                You may not have any metal on your body including hair pins and              piercings  Do not wear jewelry, lotions, powders or  deodorant             Men may shave face and neck.   Do not bring valuables to the hospital. Jacob Galvan.  Contacts, dentures or bridgework may not be worn into surgery.  Leave suitcase in the car. After surgery it may be brought to your room.     Special Instructions:               Please read over the following fact sheets you were given: _____________________________________________________________________             [facility] - Preparing for Surgery Before surgery, you can play an important role.  Because skin is not sterile, your skin needs to be as free of germs as possible.  You can reduce the number of germs on your skin by washing with CHG (chlorahexidine gluconate) soap before surgery.  CHG is an antiseptic cleaner which kills germs and bonds with the skin to continue killing germs even after washing. Please DO NOT use if you have an allergy to CHG or antibacterial soaps.  If your skin becomes reddened/irritated stop using the CHG and inform your nurse when you arrive at Short Stay. Do not shave (including legs and underarms) for at least 48 hours prior to the first CHG shower.  You may shave your face/neck. Please follow these instructions carefully:  1.  Shower with CHG Soap the night before surgery and the   morning of Surgery.  2.  If you choose to wash your hair, wash your hair first as usual with your  normal  shampoo.  3.  After you shampoo, rinse your hair and body thoroughly to remove the  shampoo.                           4.  Use CHG as you would any other liquid soap.  You can apply chg directly  to the skin and wash                       Gently with a scrungie or clean washcloth.  5.  Apply the CHG Soap to your body ONLY FROM THE NECK DOWN.   Do not use on face/ open  Wound or open sores. Avoid contact with eyes, ears mouth and genitals (private parts).                       Wash face,  Genitals (private parts) with your normal soap.             6.  Wash thoroughly, paying special attention to the area where your surgery  will be performed.  7.  Thoroughly rinse your body with warm water from the neck down.  8.  DO NOT shower/wash with your normal soap after using and rinsing off  the CHG Soap.                9.  Pat yourself dry with a clean towel.            10.  Wear clean pajamas.            11.  Place clean sheets on your bed the night of your first shower and do not  sleep with pets. Day of Surgery : Do not apply any lotions/deodorants the morning of surgery.  Please wear clean clothes to the hospital/surgery center.  FAILURE TO FOLLOW THESE INSTRUCTIONS MAY RESULT IN THE CANCELLATION OF YOUR SURGERY PATIENT SIGNATURE_________________________________  NURSE SIGNATURE__________________________________  ________________________________________________________________________

## 2017-09-25 ENCOUNTER — Telehealth: Payer: Self-pay | Admitting: Oncology

## 2017-09-25 ENCOUNTER — Other Ambulatory Visit: Payer: Self-pay

## 2017-09-25 ENCOUNTER — Ambulatory Visit: Payer: Self-pay | Admitting: General Surgery

## 2017-09-25 ENCOUNTER — Encounter (HOSPITAL_COMMUNITY)
Admission: RE | Admit: 2017-09-25 | Discharge: 2017-09-25 | Disposition: A | Discharge status: Home / Self Care | Payer: Medicaid Other | Source: Ambulatory Visit | Attending: General Surgery | Admitting: General Surgery

## 2017-09-25 ENCOUNTER — Encounter: Payer: Self-pay | Admitting: Oncology

## 2017-09-25 ENCOUNTER — Encounter (HOSPITAL_COMMUNITY): Payer: Self-pay

## 2017-09-25 ENCOUNTER — Inpatient Hospital Stay: Payer: Medicaid Other | Attending: Oncology | Admitting: Oncology

## 2017-09-25 VITALS — BP 91/62 | HR 88 | Temp 98.8°F | Resp 18 | Ht 74.0 in | Wt 140.4 lb

## 2017-09-25 DIAGNOSIS — C20 Malignant neoplasm of rectum: Secondary | ICD-10-CM | POA: Insufficient documentation

## 2017-09-25 DIAGNOSIS — Z932 Ileostomy status: Secondary | ICD-10-CM | POA: Diagnosis not present

## 2017-09-25 DIAGNOSIS — Z01812 Encounter for preprocedural laboratory examination: Secondary | ICD-10-CM | POA: Diagnosis not present

## 2017-09-25 DIAGNOSIS — Z9221 Personal history of antineoplastic chemotherapy: Secondary | ICD-10-CM | POA: Diagnosis not present

## 2017-09-25 DIAGNOSIS — K769 Liver disease, unspecified: Secondary | ICD-10-CM | POA: Insufficient documentation

## 2017-09-25 DIAGNOSIS — Z923 Personal history of irradiation: Secondary | ICD-10-CM | POA: Insufficient documentation

## 2017-09-25 DIAGNOSIS — Z800 Family history of malignant neoplasm of digestive organs: Secondary | ICD-10-CM | POA: Diagnosis not present

## 2017-09-25 LAB — CBC
HEMATOCRIT: 38.5 % — AB (ref 39.0–52.0)
HEMOGLOBIN: 12.6 g/dL — AB (ref 13.0–17.0)
MCH: 28.8 pg (ref 26.0–34.0)
MCHC: 32.7 g/dL (ref 30.0–36.0)
MCV: 87.9 fL (ref 78.0–100.0)
Platelets: 384 10*3/uL (ref 150–400)
RBC: 4.38 MIL/uL (ref 4.22–5.81)
RDW: 16.1 % — AB (ref 11.5–15.5)
WBC: 6.9 10*3/uL (ref 4.0–10.5)

## 2017-09-25 LAB — BASIC METABOLIC PANEL
Anion gap: 10 (ref 5–15)
BUN: 10 mg/dL (ref 6–20)
CO2: 25 mmol/L (ref 22–32)
Calcium: 9.3 mg/dL (ref 8.9–10.3)
Chloride: 107 mmol/L (ref 101–111)
Creatinine, Ser: 0.72 mg/dL (ref 0.61–1.24)
GFR calc Af Amer: >60 mL/min (ref >60)
Glucose, Bld: 91 mg/dL (ref 65–99)
POTASSIUM: 4 mmol/L (ref 3.5–5.1)
Sodium: 142 mmol/L (ref 135–145)

## 2017-09-25 MED ORDER — OXYCODONE HCL 5 MG PO TABS: 5.0000 mg | ORAL_TABLET | ORAL | 0 refills | Status: DC | PRN

## 2017-09-25 MED FILL — oxyCODONE HCL 5 MG TABS: 5 | 5 days supply | Qty: 30 | Fill #0

## 2017-09-25 NOTE — Telephone Encounter (Signed)
Gave pt avs and calendar with appts per 6/10 los  °

## 2017-09-25 NOTE — Progress Notes (Signed)
[place] OFFICE PROGRESS NOTE   Diagnosis: Rectal cancer  INTERVAL HISTORY:   Jacob Galvan returns for a scheduled visit.  He underwent a low anterior resection and diverting ileostomy on 07/20/2017.  He was noted to have a rectal tumor approximately 3 cm from the anal verge.  No evidence of metastatic disease on the peritoneum or liver.  The pathology revealed microscopic foci of invasive poorly differentiated adenocarcinoma.  No evidence of carcinoma and 14 of 14 lymph nodes.  Acellular mucin extends to the radial margin and is present in lymph nodes..  No macroscopic tumor perforation.  Adenocarcinoma extends into pericolonic soft tissue.  No lymphovascular or perineural invasion.  No tumor deposits.  There is a significant treatment effect.  He had a prolonged operative recovery secondary to a high output ileostomy and partial small bowel obstruction.  He was discharged home on TPN and IV fluids.  He was readmitted 09/01/2017 with fever and dehydration.  Blood cultures returned positive for Klebsiella.  The Port-A-Cath was removed 09/05/2017.  He was again admitted 09/11/2017 with hypotension and a high output ileostomy. He reports a good appetite.  He continues home IV fluids.  He is no longer on TPN.  He is scheduled for ileostomy reversal surgery on 09/27/2017.  He continues to have intermittent low abdominal pain for which she takes oxycodone.  Objective:  Vital signs in last 24 hours:  Blood pressure 91/62, pulse 88, temperature 98.8 F (37.1 C), temperature source Oral, resp. rate 18, height 6\' 2"  (1.88 m), weight 140 lb 6.4 oz (63.7 kg), SpO2 100 %.    HEENT: Mucous membranes are moist Resp: Lungs clear bilaterally Cardio: Regular rate and rhythm GI: No hepatomegaly, right lower quadrant ileostomy with liquid stool Vascular: No leg edema   Lab Results:  Lab Results  Component Value Date   WBC 6.9 09/25/2017   HGB 12.6 (L) 09/25/2017   HCT 38.5 (L) 09/25/2017   MCV 87.9 09/25/2017   PLT 384 09/25/2017   NEUTROABS 3.4 09/13/2017    CMP  Lab Results  Component Value Date   NA 142 09/25/2017   K 4.0 09/25/2017   CL 107 09/25/2017   CO2 25 09/25/2017   GLUCOSE 91 09/25/2017   BUN 10 09/25/2017   CREATININE 0.72 09/25/2017   CALCIUM 9.3 09/25/2017   PROT 6.2 (L) 09/13/2017   ALBUMIN 3.0 (L) 09/13/2017   AST 39 09/13/2017   ALT 97 (H) 09/13/2017   ALKPHOS 134 (H) 09/13/2017   BILITOT 0.8 09/13/2017   GFRNONAA >60 09/25/2017   GFRAA >60 09/25/2017    Lab Results  Component Value Date   CEA1 15.34 (H) 03/24/2017     Medications: I have reviewed the patient's current medications.   Assessment/Plan: 1. 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 Abdomen MRI 10/06/2016 (T4bN2)tumor, negative chest CT ? 1 cycle of CapoxJuly 2018 ? Transferred care to [facility] August 2018 ? Status post Port-A-Cath placement 12/14/2016 ? CT abdomen/pelvis 02/09/2017-mass at the left rectum with adjacent fat stranding and subcentimeter nodules, ill-defined hypodense lesion in the superior left liver-indeterminate ? 3 cycles of FOLFOX, last cycle 02/15/2017 ? Radiation 04/03/2017; completed 05/19/2017 ? Infusional 5-FU 04/03/2017; completed 05/15/2017 ? PET scan 05/08/2017-rectal mass fairly mildly hypermetabolic with maximum SUV 5.7, with adjacent perirectal tumor nodularity mildly hypermetabolic with maximum SUV 3.1.  Dominant lesion in the dome of the left hepatic lobe is hypermetabolic with maximum SUV of 4.7.  Smaller 4 mm hypodense lesion centrally in the liver is not appreciably hypermetabolic. ? 05/30/2017 biopsy of liver lesion-negative for malignancy ? Low anterior resection/diverting ileostomy 07/20/2017,ypT3,ypN0, poorly differentiated  adenocarcinoma, microscopic foci of tumor.  Acellular mucin is present in lymph nodes and at the radial margin.  ? CT abdomen/pelvis 09/11/2017-stable left hepatic lesion 2.Pain secondary to #1.Improved 04/17/2017. 3.Constipation secondary to #1 and narcotics 4.Psychiatric admission August 2018-depression/suicidal ideation, felt to be related to capecitabine per patient 5.Family history of multiple cancers including pancreas and colon cancer. He has been seen by the genetics counselor. 6.  Postoperative small bowel obstruction April 2019      Disposition: Jacob Galvan underwent a low anterior resection and ileostomy in early April.  He had a long postoperative recovery.  He is scheduled for an ileostomy takedown procedure and resection of the liver lesion on 09/27/2017.  The liver lesion has not changed on repeat CTs.  I will see him after the ileostomy takedown.  We will follow-up on pathology if a liver resection is able to be performed.  I doubt he will be a candidate for further "adjuvant "therapy as it is now been almost a year since the beginning of neoadjuvant therapy.  I refilled his prescription for oxycodone.  Betsy Coder, MD  09/25/2017  2:09 PM

## 2017-09-26 MED ORDER — BUPIVACAINE LIPOSOME 1.3 % IJ SUSP
20.0000 mL | INTRAMUSCULAR | Status: DC
  Filled 2017-09-26: qty 20

## 2017-09-26 NOTE — Anesthesia Preprocedure Evaluation (Addendum)
Anesthesia Evaluation  Patient identified by MRN, date of birth, ID band Patient awake    Reviewed: Allergy & Precautions, NPO status , Patient's Chart, lab work & pertinent test results  Airway Mallampati: I  TM Distance: >3 FB Neck ROM: Full    Dental no notable dental hx. (+) Teeth Intact, Dental Advisory Given   Pulmonary Current Smoker,    Pulmonary exam normal        Cardiovascular negative cardio ROS Normal cardiovascular exam     Neuro/Psych  Headaches, negative psych ROS   GI/Hepatic negative GI ROS, Neg liver ROS,   Endo/Other  negative endocrine ROS  Renal/GU negative Renal ROS     Musculoskeletal negative musculoskeletal ROS (+)   Abdominal   Peds  Hematology negative hematology ROS (+)   Anesthesia Other Findings   Reproductive/Obstetrics                            Lab Results  Component Value Date   CREATININE 0.72 09/25/2017   BUN 10 09/25/2017   NA 142 09/25/2017   K 4.0 09/25/2017   CL 107 09/25/2017   CO2 25 09/25/2017    Lab Results  Component Value Date   WBC 6.9 09/25/2017   HGB 12.6 (L) 09/25/2017   HCT 38.5 (L) 09/25/2017   MCV 87.9 09/25/2017   PLT 384 09/25/2017    Anesthesia Physical Anesthesia Plan  ASA: III  Anesthesia Plan: General   Post-op Pain Management:    Induction: Intravenous  PONV Risk Score and Plan: 2 and Treatment may vary due to age or medical condition, Dexamethasone and Ondansetron  Airway Management Planned: Oral ETT  Additional Equipment:   Intra-op Plan:   Post-operative Plan: Extubation in OR  Informed Consent: I have reviewed the patients History and Physical, chart, labs and discussed the procedure including the risks, benefits and alternatives for the proposed anesthesia with the patient or authorized representative who has indicated his/her understanding and acceptance.   Dental advisory given  Plan  Discussed with: CRNA  Anesthesia Plan Comments:         Anesthesia Quick Evaluation

## 2017-09-27 ENCOUNTER — Encounter (HOSPITAL_COMMUNITY): Payer: Self-pay | Admitting: *Deleted

## 2017-09-27 ENCOUNTER — Inpatient Hospital Stay (HOSPITAL_COMMUNITY)
Admission: RE | Admit: 2017-09-27 | Discharge: 2017-10-03 | DRG: 330 | Disposition: A | Discharge status: Home / Self Care | Payer: Medicaid Other | Attending: General Surgery | Admitting: General Surgery

## 2017-09-27 ENCOUNTER — Encounter (HOSPITAL_COMMUNITY)
Admission: RE | Disposition: A | Discharge status: Home / Self Care | Payer: Self-pay | Source: Home / Self Care | Attending: General Surgery

## 2017-09-27 ENCOUNTER — Inpatient Hospital Stay (HOSPITAL_COMMUNITY): Payer: Medicaid Other | Admitting: Anesthesiology

## 2017-09-27 ENCOUNTER — Other Ambulatory Visit: Payer: Self-pay

## 2017-09-27 DIAGNOSIS — Z9221 Personal history of antineoplastic chemotherapy: Secondary | ICD-10-CM | POA: Diagnosis not present

## 2017-09-27 DIAGNOSIS — K9131 Postprocedural partial intestinal obstruction: Secondary | ICD-10-CM | POA: Diagnosis present

## 2017-09-27 DIAGNOSIS — Z79899 Other long term (current) drug therapy: Secondary | ICD-10-CM | POA: Diagnosis not present

## 2017-09-27 DIAGNOSIS — Z6820 Body mass index (BMI) 20.0-20.9, adult: Secondary | ICD-10-CM | POA: Diagnosis not present

## 2017-09-27 DIAGNOSIS — Z825 Family history of asthma and other chronic lower respiratory diseases: Secondary | ICD-10-CM | POA: Diagnosis not present

## 2017-09-27 DIAGNOSIS — Z833 Family history of diabetes mellitus: Secondary | ICD-10-CM | POA: Diagnosis not present

## 2017-09-27 DIAGNOSIS — K660 Peritoneal adhesions (postprocedural) (postinfection): Secondary | ICD-10-CM | POA: Diagnosis present

## 2017-09-27 DIAGNOSIS — E440 Moderate protein-calorie malnutrition: Secondary | ICD-10-CM | POA: Diagnosis present

## 2017-09-27 DIAGNOSIS — Z432 Encounter for attention to ileostomy: Secondary | ICD-10-CM | POA: Diagnosis present

## 2017-09-27 DIAGNOSIS — Z808 Family history of malignant neoplasm of other organs or systems: Secondary | ICD-10-CM | POA: Diagnosis not present

## 2017-09-27 DIAGNOSIS — R160 Hepatomegaly, not elsewhere classified: Secondary | ICD-10-CM | POA: Diagnosis present

## 2017-09-27 DIAGNOSIS — K56609 Unspecified intestinal obstruction, unspecified as to partial versus complete obstruction: Secondary | ICD-10-CM | POA: Diagnosis present

## 2017-09-27 DIAGNOSIS — C20 Malignant neoplasm of rectum: Secondary | ICD-10-CM | POA: Diagnosis present

## 2017-09-27 DIAGNOSIS — Z803 Family history of malignant neoplasm of breast: Secondary | ICD-10-CM

## 2017-09-27 DIAGNOSIS — Z8249 Family history of ischemic heart disease and other diseases of the circulatory system: Secondary | ICD-10-CM | POA: Diagnosis not present

## 2017-09-27 DIAGNOSIS — F17200 Nicotine dependence, unspecified, uncomplicated: Secondary | ICD-10-CM | POA: Diagnosis present

## 2017-09-27 DIAGNOSIS — Z8043 Family history of malignant neoplasm of testis: Secondary | ICD-10-CM

## 2017-09-27 DIAGNOSIS — Z932 Ileostomy status: Secondary | ICD-10-CM

## 2017-09-27 DIAGNOSIS — Z923 Personal history of irradiation: Secondary | ICD-10-CM

## 2017-09-27 DIAGNOSIS — K769 Liver disease, unspecified: Secondary | ICD-10-CM | POA: Diagnosis present

## 2017-09-27 DIAGNOSIS — Z800 Family history of malignant neoplasm of digestive organs: Secondary | ICD-10-CM | POA: Diagnosis not present

## 2017-09-27 HISTORY — PX: ILEOSTOMY: SHX1783

## 2017-09-27 HISTORY — PX: LIVER BIOPSY: SHX301

## 2017-09-27 LAB — TYPE AND SCREEN
ABO/RH(D): O POS
Antibody Screen: NEGATIVE

## 2017-09-27 SURGERY — CREATION, ILEOSTOMY
Anesthesia: General

## 2017-09-27 MED ORDER — LIDOCAINE 2% (20 MG/ML) 5 ML SYRINGE
INTRAMUSCULAR | Status: AC
  Filled 2017-09-27: qty 5

## 2017-09-27 MED ORDER — ONDANSETRON HCL 4 MG PO TABS: 4.0000 mg | ORAL_TABLET | Freq: Four times a day (QID) | ORAL | Status: DC | PRN

## 2017-09-27 MED ORDER — BUPIVACAINE LIPOSOME 1.3 % IJ SUSP
INTRAMUSCULAR | Status: DC | PRN
  Administered 2017-09-27: 20 mL

## 2017-09-27 MED ORDER — SODIUM CHLORIDE 0.9 % IR SOLN
Status: DC | PRN
  Administered 2017-09-27: 2000 mL

## 2017-09-27 MED ORDER — METHOCARBAMOL 1000 MG/10ML IJ SOLN
1000.0000 mg | Freq: Four times a day (QID) | INTRAVENOUS | Status: DC | PRN
  Administered 2017-09-28: 1000 mg via INTRAVENOUS
  Filled 2017-09-27 (×2): qty 10

## 2017-09-27 MED ORDER — HYDROMORPHONE HCL 1 MG/ML IJ SOLN
0.5000 mg | INTRAMUSCULAR | Status: DC | PRN
  Administered 2017-09-27: 0.5 mg via INTRAVENOUS
  Filled 2017-09-27: qty 0.5

## 2017-09-27 MED ORDER — ONDANSETRON HCL 4 MG/2ML IJ SOLN
INTRAMUSCULAR | Status: AC
  Filled 2017-09-27: qty 2

## 2017-09-27 MED ORDER — KETAMINE HCL 10 MG/ML IJ SOLN
INTRAMUSCULAR | Status: AC
  Filled 2017-09-27: qty 1

## 2017-09-27 MED ORDER — DEXAMETHASONE SODIUM PHOSPHATE 10 MG/ML IJ SOLN
INTRAMUSCULAR | Status: DC | PRN
  Administered 2017-09-27: 10 mg via INTRAVENOUS

## 2017-09-27 MED ORDER — PROMETHAZINE HCL 25 MG/ML IJ SOLN: 6.2500 mg | INTRAMUSCULAR | Status: DC | PRN

## 2017-09-27 MED ORDER — HYDROMORPHONE HCL 1 MG/ML IJ SOLN
1.0000 mg | Freq: Once | INTRAMUSCULAR | Status: AC
  Administered 2017-09-27: 1 mg via INTRAVENOUS
  Filled 2017-09-27: qty 1

## 2017-09-27 MED ORDER — PROMETHAZINE HCL 25 MG/ML IJ SOLN
INTRAMUSCULAR | Status: AC
  Filled 2017-09-27: qty 1

## 2017-09-27 MED ORDER — LIP MEDEX EX OINT
1.0000 "application " | TOPICAL_OINTMENT | Freq: Two times a day (BID) | CUTANEOUS | Status: DC
  Administered 2017-09-27 – 2017-10-02 (×10): 1 via TOPICAL
  Filled 2017-09-27 (×2): qty 7

## 2017-09-27 MED ORDER — GABAPENTIN 300 MG PO CAPS
300.0000 mg | ORAL_CAPSULE | ORAL | Status: AC
  Administered 2017-09-27: 300 mg via ORAL
  Filled 2017-09-27: qty 1

## 2017-09-27 MED ORDER — LIDOCAINE 2% (20 MG/ML) 5 ML SYRINGE
INTRAMUSCULAR | Status: DC | PRN
  Administered 2017-09-27: 1.5 mg/kg/h via INTRAVENOUS

## 2017-09-27 MED ORDER — BUPIVACAINE-EPINEPHRINE 0.25% -1:200000 IJ SOLN
INTRAMUSCULAR | Status: DC | PRN
  Administered 2017-09-27: 30 mL

## 2017-09-27 MED ORDER — LACTATED RINGERS IV SOLN
INTRAVENOUS | Status: DC
  Administered 2017-09-27 (×2): via INTRAVENOUS

## 2017-09-27 MED ORDER — ONDANSETRON HCL 4 MG/2ML IJ SOLN: 4.0000 mg | Freq: Four times a day (QID) | INTRAMUSCULAR | Status: DC | PRN

## 2017-09-27 MED ORDER — ACETAMINOPHEN 500 MG PO TABS
1000.0000 mg | ORAL_TABLET | ORAL | Status: AC
  Administered 2017-09-27: 1000 mg via ORAL
  Filled 2017-09-27: qty 2

## 2017-09-27 MED ORDER — HYDROMORPHONE HCL 1 MG/ML IJ SOLN
INTRAMUSCULAR | Status: DC | PRN
  Administered 2017-09-27: .4 mg via INTRAVENOUS

## 2017-09-27 MED ORDER — PROPOFOL 10 MG/ML IV BOLUS
INTRAVENOUS | Status: DC | PRN
  Administered 2017-09-27: 100 mg via INTRAVENOUS

## 2017-09-27 MED ORDER — ROCURONIUM BROMIDE 10 MG/ML (PF) SYRINGE
PREFILLED_SYRINGE | INTRAVENOUS | Status: AC
  Filled 2017-09-27: qty 5

## 2017-09-27 MED ORDER — MIDAZOLAM HCL 2 MG/2ML IJ SOLN
INTRAMUSCULAR | Status: DC | PRN
  Administered 2017-09-27: 2 mg via INTRAVENOUS

## 2017-09-27 MED ORDER — MIDAZOLAM HCL 2 MG/2ML IJ SOLN
INTRAMUSCULAR | Status: AC
  Filled 2017-09-27: qty 2

## 2017-09-27 MED ORDER — ENOXAPARIN SODIUM 40 MG/0.4ML ~~LOC~~ SOLN
40.0000 mg | SUBCUTANEOUS | Status: DC
  Administered 2017-09-28 – 2017-10-03 (×5): 40 mg via SUBCUTANEOUS
  Filled 2017-09-27 (×5): qty 0.4

## 2017-09-27 MED ORDER — FENTANYL CITRATE (PF) 250 MCG/5ML IJ SOLN
INTRAMUSCULAR | Status: DC | PRN
  Administered 2017-09-27 (×5): 50 ug via INTRAVENOUS

## 2017-09-27 MED ORDER — SODIUM CHLORIDE 0.9 % IV SOLN
2.0000 g | Freq: Two times a day (BID) | INTRAVENOUS | Status: AC
  Administered 2017-09-27: 2 g via INTRAVENOUS
  Filled 2017-09-27: qty 2

## 2017-09-27 MED ORDER — KCL IN DEXTROSE-NACL 20-5-0.45 MEQ/L-%-% IV SOLN
INTRAVENOUS | Status: DC
  Administered 2017-09-27 (×2): via INTRAVENOUS
  Administered 2017-09-28: 75 mL/h via INTRAVENOUS
  Administered 2017-09-28: 19:00:00 via INTRAVENOUS
  Administered 2017-09-30: 75 mL/h via INTRAVENOUS
  Administered 2017-10-01 – 2017-10-02 (×3): via INTRAVENOUS
  Filled 2017-09-27 (×9): qty 1000

## 2017-09-27 MED ORDER — LIP MEDEX EX OINT
TOPICAL_OINTMENT | CUTANEOUS | Status: AC
  Administered 2017-09-27: 1
  Filled 2017-09-27: qty 7

## 2017-09-27 MED ORDER — HYDROMORPHONE HCL 2 MG/ML IJ SOLN
INTRAMUSCULAR | Status: AC
  Filled 2017-09-27: qty 1

## 2017-09-27 MED ORDER — HYDROCODONE-ACETAMINOPHEN 7.5-325 MG PO TABS: 1.0000 | ORAL_TABLET | Freq: Once | ORAL | Status: DC | PRN

## 2017-09-27 MED ORDER — SUGAMMADEX SODIUM 200 MG/2ML IV SOLN
INTRAVENOUS | Status: AC
  Filled 2017-09-27: qty 2

## 2017-09-27 MED ORDER — DIPHENHYDRAMINE HCL 25 MG PO CAPS: 25.0000 mg | ORAL_CAPSULE | Freq: Four times a day (QID) | ORAL | Status: DC | PRN

## 2017-09-27 MED ORDER — LACTATED RINGERS IR SOLN
Status: DC | PRN
  Administered 2017-09-27: 1000 mL

## 2017-09-27 MED ORDER — ONDANSETRON HCL 4 MG/2ML IJ SOLN
INTRAMUSCULAR | Status: DC | PRN
  Administered 2017-09-27: 4 mg via INTRAVENOUS

## 2017-09-27 MED ORDER — ACETAMINOPHEN 500 MG PO TABS
1000.0000 mg | ORAL_TABLET | Freq: Four times a day (QID) | ORAL | Status: DC
  Administered 2017-09-27 – 2017-10-03 (×11): 1000 mg via ORAL
  Filled 2017-09-27 (×14): qty 2

## 2017-09-27 MED ORDER — ACETAMINOPHEN 10 MG/ML IV SOLN: 1000.0000 mg | Freq: Once | INTRAVENOUS | Status: DC | PRN

## 2017-09-27 MED ORDER — FENTANYL CITRATE (PF) 250 MCG/5ML IJ SOLN
INTRAMUSCULAR | Status: AC
  Filled 2017-09-27: qty 5

## 2017-09-27 MED ORDER — DEXAMETHASONE SODIUM PHOSPHATE 10 MG/ML IJ SOLN
INTRAMUSCULAR | Status: AC
  Filled 2017-09-27: qty 1

## 2017-09-27 MED ORDER — HYDROMORPHONE HCL 1 MG/ML IJ SOLN: 1.0000 mg | INTRAMUSCULAR | Status: DC | PRN

## 2017-09-27 MED ORDER — SODIUM CHLORIDE 0.9 % IV SOLN
2.0000 g | INTRAVENOUS | Status: AC
  Administered 2017-09-27: 2 g via INTRAVENOUS
  Filled 2017-09-27: qty 2

## 2017-09-27 MED ORDER — ALUM & MAG HYDROXIDE-SIMETH 200-200-20 MG/5ML PO SUSP: 30.0000 mL | Freq: Four times a day (QID) | ORAL | Status: DC | PRN

## 2017-09-27 MED ORDER — SODIUM CHLORIDE 0.9 % IJ SOLN
INTRAMUSCULAR | Status: AC
  Filled 2017-09-27: qty 10

## 2017-09-27 MED ORDER — GABAPENTIN 400 MG PO CAPS
400.0000 mg | ORAL_CAPSULE | Freq: Three times a day (TID) | ORAL | Status: DC
  Administered 2017-09-27 – 2017-10-03 (×17): 400 mg via ORAL
  Filled 2017-09-27 (×17): qty 1

## 2017-09-27 MED ORDER — DIPHENHYDRAMINE HCL 50 MG/ML IJ SOLN: 25.0000 mg | Freq: Four times a day (QID) | INTRAMUSCULAR | Status: DC | PRN

## 2017-09-27 MED ORDER — SODIUM CHLORIDE 0.9 % IV SOLN
8.0000 mg | Freq: Four times a day (QID) | INTRAVENOUS | Status: DC | PRN
  Filled 2017-09-27: qty 4

## 2017-09-27 MED ORDER — HYDROMORPHONE HCL 1 MG/ML IJ SOLN
INTRAMUSCULAR | Status: AC
  Filled 2017-09-27: qty 2

## 2017-09-27 MED ORDER — SODIUM CHLORIDE 0.9 % IV SOLN
25.0000 mg | Freq: Four times a day (QID) | INTRAVENOUS | Status: DC | PRN
  Filled 2017-09-27: qty 1

## 2017-09-27 MED ORDER — METOCLOPRAMIDE HCL 5 MG/ML IJ SOLN: 10.0000 mg | Freq: Four times a day (QID) | INTRAMUSCULAR | Status: DC | PRN

## 2017-09-27 MED ORDER — SUGAMMADEX SODIUM 200 MG/2ML IV SOLN
INTRAVENOUS | Status: DC | PRN
  Administered 2017-09-27: 150 mg via INTRAVENOUS

## 2017-09-27 MED ORDER — ONDANSETRON HCL 4 MG/2ML IJ SOLN
4.0000 mg | Freq: Four times a day (QID) | INTRAMUSCULAR | Status: DC | PRN
  Administered 2017-09-27: 4 mg via INTRAVENOUS
  Filled 2017-09-27: qty 2

## 2017-09-27 MED ORDER — GABAPENTIN 300 MG PO CAPS: 300.0000 mg | ORAL_CAPSULE | Freq: Three times a day (TID) | ORAL | Status: DC

## 2017-09-27 MED ORDER — ROCURONIUM BROMIDE 100 MG/10ML IV SOLN
INTRAVENOUS | Status: DC | PRN
  Administered 2017-09-27: 10 mg via INTRAVENOUS
  Administered 2017-09-27: 35 mg via INTRAVENOUS
  Administered 2017-09-27: 15 mg via INTRAVENOUS
  Administered 2017-09-27: 10 mg via INTRAVENOUS

## 2017-09-27 MED ORDER — MAGIC MOUTHWASH
15.0000 mL | Freq: Four times a day (QID) | ORAL | Status: DC | PRN
  Filled 2017-09-27: qty 15

## 2017-09-27 MED ORDER — METHOCARBAMOL 500 MG PO TABS
1000.0000 mg | ORAL_TABLET | Freq: Four times a day (QID) | ORAL | Status: DC | PRN
  Administered 2017-09-27: 1000 mg via ORAL
  Filled 2017-09-27: qty 2

## 2017-09-27 MED ORDER — KETAMINE HCL 10 MG/ML IJ SOLN
INTRAMUSCULAR | Status: DC | PRN
  Administered 2017-09-27: 20 mg via INTRAVENOUS
  Administered 2017-09-27: 30 mg via INTRAVENOUS

## 2017-09-27 MED ORDER — PROPOFOL 10 MG/ML IV BOLUS
INTRAVENOUS | Status: AC
  Filled 2017-09-27: qty 20

## 2017-09-27 MED ORDER — HYDROMORPHONE HCL 1 MG/ML IJ SOLN
1.0000 mg | INTRAMUSCULAR | Status: DC | PRN
  Administered 2017-09-27 – 2017-09-28 (×3): 2 mg via INTRAVENOUS
  Administered 2017-09-28: 1 mg via INTRAVENOUS
  Administered 2017-09-28 (×3): 2 mg via INTRAVENOUS
  Administered 2017-09-28: 1 mg via INTRAVENOUS
  Administered 2017-09-28: 2 mg via INTRAVENOUS
  Administered 2017-09-28: 1 mg via INTRAVENOUS
  Administered 2017-09-28 (×2): 2 mg via INTRAVENOUS
  Administered 2017-09-29 (×2): 1 mg via INTRAVENOUS
  Administered 2017-09-29: 2 mg via INTRAVENOUS
  Administered 2017-09-29: 1 mg via INTRAVENOUS
  Administered 2017-09-29 (×3): 2 mg via INTRAVENOUS
  Administered 2017-09-29: 1 mg via INTRAVENOUS
  Administered 2017-09-29 – 2017-09-30 (×2): 2 mg via INTRAVENOUS
  Administered 2017-09-30 – 2017-10-02 (×24): 1 mg via INTRAVENOUS
  Filled 2017-09-27: qty 2
  Filled 2017-09-27 (×3): qty 1
  Filled 2017-09-27 (×2): qty 2
  Filled 2017-09-27 (×5): qty 1
  Filled 2017-09-27 (×2): qty 2
  Filled 2017-09-27 (×2): qty 1
  Filled 2017-09-27 (×2): qty 2
  Filled 2017-09-27 (×2): qty 1
  Filled 2017-09-27 (×2): qty 2
  Filled 2017-09-27: qty 1
  Filled 2017-09-27: qty 2
  Filled 2017-09-27 (×2): qty 1
  Filled 2017-09-27: qty 2
  Filled 2017-09-27 (×3): qty 1
  Filled 2017-09-27: qty 2
  Filled 2017-09-27 (×8): qty 1
  Filled 2017-09-27 (×2): qty 2
  Filled 2017-09-27 (×6): qty 1
  Filled 2017-09-27: qty 2

## 2017-09-27 MED ORDER — HYDROMORPHONE HCL 1 MG/ML IJ SOLN
0.2500 mg | INTRAMUSCULAR | Status: DC | PRN
  Administered 2017-09-27 (×4): 0.5 mg via INTRAVENOUS

## 2017-09-27 MED ORDER — MEPERIDINE HCL 50 MG/ML IJ SOLN: 6.2500 mg | INTRAMUSCULAR | Status: DC | PRN

## 2017-09-27 MED ORDER — LIDOCAINE 2% (20 MG/ML) 5 ML SYRINGE
INTRAMUSCULAR | Status: DC | PRN
  Administered 2017-09-27: 100 mg via INTRAVENOUS

## 2017-09-27 MED ORDER — BUPIVACAINE-EPINEPHRINE (PF) 0.25% -1:200000 IJ SOLN
INTRAMUSCULAR | Status: AC
  Filled 2017-09-27: qty 30

## 2017-09-27 SURGICAL SUPPLY — 53 items
APPLIER CLIP ROT 10 11.4 M/L (STAPLE) ×2
BLADE EXTENDED COATED 6.5IN (ELECTRODE) IMPLANT
CLIP APPLIE ROT 10 11.4 M/L (STAPLE) ×1 IMPLANT
COVER MAYO STAND STRL (DRAPES) IMPLANT
DECANTER SPIKE VIAL GLASS SM (MISCELLANEOUS) ×2 IMPLANT
DERMABOND ADVANCED (GAUZE/BANDAGES/DRESSINGS) ×1
DERMABOND ADVANCED .7 DNX12 (GAUZE/BANDAGES/DRESSINGS) ×1 IMPLANT
DRSG TELFA 3X8 NADH (GAUZE/BANDAGES/DRESSINGS) ×2 IMPLANT
ELECT PENCIL ROCKER SW 15FT (MISCELLANEOUS) ×2 IMPLANT
ELECT REM PT RETURN 15FT ADLT (MISCELLANEOUS) ×2 IMPLANT
GAUZE SPONGE 4X4 12PLY STRL (GAUZE/BANDAGES/DRESSINGS) ×2 IMPLANT
GLOVE BIO SURGEON STRL SZ 6.5 (GLOVE) ×4 IMPLANT
GLOVE BIOGEL PI IND STRL 7.0 (GLOVE) ×1 IMPLANT
GLOVE BIOGEL PI INDICATOR 7.0 (GLOVE) ×1
GOWN STRL REUS W/TWL 2XL LVL3 (GOWN DISPOSABLE) ×2 IMPLANT
GOWN STRL REUS W/TWL XL LVL3 (GOWN DISPOSABLE) ×4 IMPLANT
HANDLE SUCTION POOLE (INSTRUMENTS) IMPLANT
HEMOSTAT SNOW SURGICEL 2X4 (HEMOSTASIS) ×2 IMPLANT
IRRIG SUCT STRYKERFLOW 2 WTIP (MISCELLANEOUS) ×2
IRRIGATION SUCT STRKRFLW 2 WTP (MISCELLANEOUS) ×1 IMPLANT
L-HOOK LAP DISP 36CM (ELECTROSURGICAL) ×2
LHOOK LAP DISP 36CM (ELECTROSURGICAL) ×1 IMPLANT
PACK COLON (CUSTOM PROCEDURE TRAY) IMPLANT
PACK GENERAL/GYN (CUSTOM PROCEDURE TRAY) ×2 IMPLANT
PORT LAP GEL ALEXIS MED 5-9CM (MISCELLANEOUS) ×2 IMPLANT
SEALER TISSUE G2 STRG ARTC 35C (ENDOMECHANICALS) IMPLANT
SHEARS HARMONIC ACE PLUS 36CM (ENDOMECHANICALS) ×2 IMPLANT
SLEEVE XCEL OPT CAN 5 100 (ENDOMECHANICALS) ×4 IMPLANT
SPONGE LAP 18X18 RF (DISPOSABLE) IMPLANT
STAPLER GUN LINEAR PROX 60 (STAPLE) ×2 IMPLANT
STAPLER PROXIMATE 75MM BLUE (STAPLE) ×2 IMPLANT
STAPLER VISISTAT 35W (STAPLE) IMPLANT
SUCTION POOLE HANDLE (INSTRUMENTS)
SUT NOVA 1 T20/GS 25DT (SUTURE) IMPLANT
SUT NOVA NAB DX-16 0-1 5-0 T12 (SUTURE) ×2 IMPLANT
SUT PDS AB 1 TP1 96 (SUTURE) IMPLANT
SUT PROLENE 2 0 KS (SUTURE) IMPLANT
SUT PROLENE 2 0 SH DA (SUTURE) IMPLANT
SUT SILK 2 0 (SUTURE) ×1
SUT SILK 2 0 SH CR/8 (SUTURE) ×2 IMPLANT
SUT SILK 2-0 18XBRD TIE 12 (SUTURE) ×1 IMPLANT
SUT SILK 3 0 (SUTURE) ×1
SUT SILK 3 0 SH CR/8 (SUTURE) ×2 IMPLANT
SUT SILK 3-0 18XBRD TIE 12 (SUTURE) ×1 IMPLANT
SUT VIC AB 2-0 SH 27 (SUTURE) ×1
SUT VIC AB 2-0 SH 27X BRD (SUTURE) ×1 IMPLANT
TAPE CLOTH SURG 6X10 WHT LF (GAUZE/BANDAGES/DRESSINGS) ×2 IMPLANT
TOWEL OR 17X26 10 PK STRL BLUE (TOWEL DISPOSABLE) IMPLANT
TOWEL OR NON WOVEN STRL DISP B (DISPOSABLE) ×2 IMPLANT
TRAY FOLEY MTR SLVR 16FR STAT (SET/KITS/TRAYS/PACK) ×2 IMPLANT
TROCAR BLADELESS OPT 5 100 (ENDOMECHANICALS) ×2 IMPLANT
TROCAR XCEL BLUNT TIP 100MML (ENDOMECHANICALS) IMPLANT
TROCAR XCEL NON-BLD 11X100MML (ENDOMECHANICALS) IMPLANT

## 2017-09-27 NOTE — Anesthesia Procedure Notes (Signed)
Procedure Name: Intubation Date/Time: 09/27/2017 12:47 PM Performed by: Sharlette Dense, CRNA Patient Re-evaluated:Patient Re-evaluated prior to induction Oxygen Delivery Method: Circle system utilized Preoxygenation: Pre-oxygenation with 100% oxygen Induction Type: IV induction Ventilation: Mask ventilation without difficulty and Oral airway inserted - appropriate to patient size Laryngoscope Size: Miller and 3 Grade View: Grade I Tube type: Oral Tube size: 8.0 mm Number of attempts: 1 Airway Equipment and Method: Stylet Placement Confirmation: ETT inserted through vocal cords under direct vision,  positive ETCO2 and breath sounds checked- equal and bilateral Secured at: 23 cm Tube secured with: Tape Dental Injury: Teeth and Oropharynx as per pre-operative assessment

## 2017-09-27 NOTE — Transfer of Care (Signed)
Immediate Anesthesia Transfer of Care Note  Patient: Jacob Galvan.  Procedure(s) Performed: ILEOSTOMY REVERSAL (N/A ) LAPAROSCOPIC ULTRASOUND GUIDED LIVER BIOPSY (N/A )  Patient Location: PACU  Anesthesia Type:General  Level of Consciousness: sedated  Airway & Oxygen Therapy: Patient Spontanous Breathing and Patient connected to face mask oxygen  Post-op Assessment: Report given to RN and Post -op Vital signs reviewed and stable  Post vital signs: Reviewed and stable  Last Vitals:  Vitals Value Taken Time  BP 117/80 09/27/2017  2:53 PM  Temp    Pulse 77 09/27/2017  2:54 PM  Resp 14 09/27/2017  2:54 PM  SpO2 100 % 09/27/2017  2:54 PM  Vitals shown include unvalidated device data.  Last Pain:  Vitals:   09/27/17 1102  TempSrc:   PainSc: 0-No pain         Complications: No apparent anesthesia complications

## 2017-09-27 NOTE — Anesthesia Postprocedure Evaluation (Signed)
Anesthesia Post Note  Patient: Jacob Galvan.  Procedure(s) Performed: ILEOSTOMY REVERSAL (N/A ) LAPAROSCOPIC ULTRASOUND GUIDED LIVER BIOPSY (N/A )     Patient location during evaluation: PACU Anesthesia Type: General Level of consciousness: awake and alert Pain management: pain level controlled Vital Signs Assessment: post-procedure vital signs reviewed and stable Respiratory status: spontaneous breathing, nonlabored ventilation, respiratory function stable and patient connected to nasal cannula oxygen Cardiovascular status: blood pressure returned to baseline and stable Postop Assessment: no apparent nausea or vomiting Anesthetic complications: no    Last Vitals:  Vitals:   09/27/17 1530 09/27/17 1545  BP: 122/79 120/80  Pulse: 79 79  Resp: 14 12  Temp:  36.5 C  SpO2: 100% 100%    Last Pain:  Vitals:   09/27/17 1545  TempSrc:   PainSc: [place] A [place]

## 2017-09-27 NOTE — H&P (Signed)
Jacob Galvan. is an 42 y.o. male.   Chief Complaint: rectal cancer, s/p LAR and diverting ileostomy, prolonged SBO, liver mass HPI: 42 year old male who underwent a low anterior resection with anastomosis and diverting ileostomy in early April 2019.  He developed a postoperative small bowel obstruction that failed to resolve over the first month of his surgery.  This was treated conservatively.  He was placed on TPN until he was finally able to eat solid food which occurred approximately 6 weeks after surgery.  He is now ready for ileostomy reversal.  His anastomosis is patent and without stricture.  It appears to have healed well on digital exam.  We will perform a diagnostic laparoscopy at the same time to evaluate the status of the small bowel in his pelvis and possibly perform lysis of adhesion is if needed.  He also has a liver mass that was unable to be biopsied in interventional radiology.  Dr. Barry Dienes will evaluate with lapper scopic ultrasound for possible intraoperative biopsy.  Past Medical History:  Diagnosis Date  . Headache   . Rectal cancer ([facility]) 2018   radiation and Chemo    Past Surgical History:  Procedure Laterality Date  . COLONOSCOPY    . LAPAROSCOPIC DIVERTED COLOSTOMY N/A 07/20/2017   Procedure: DIVERTING LOOP COLOSTOMY;  Surgeon: Leighton Ruff, MD;  Location: [facility] ORS;  Service: General;  Laterality: N/A;  . PORT-A-CATH REMOVAL N/A 09/05/2017   Procedure: REMOVAL PORT-A-CATH;  Surgeon: Leighton Ruff, MD;  Location: [facility] OR[facility];  Service: General;  Laterality: N/A;  . PORTA CATH INSERTION    . WISDOM TOOTH EXTRACTION    . XI ROBOTIC ASSISTED LOWER ANTERIOR RESECTION N/A 07/20/2017   Procedure: XI ROBOTIC ASSISTED LOWER ANTERIOR RESECTION ERAS PATHWAY;  Surgeon: Leighton Ruff, MD;  Location: [facility];  Service: General;  Laterality: N/A;    Family History  Problem Relation Age of Onset  . Diabetes Other   . Diabetes Maternal Grandmother   . Colon cancer Paternal Grandfather    . Pancreatic cancer Paternal Grandfather 48  . Breast cancer Paternal Aunt 59  . Melanoma Sister 12       x2  . Testicular cancer Maternal Grandfather 36  . COPD Paternal Grandmother   . Colon cancer Paternal Uncle 32  . Pancreatic cancer Paternal Uncle   . Testicular cancer Other   . Heart Problems Son 5   Social History:  reports that he has been smoking.  He has never used smokeless tobacco. He reports that he drinks alcohol. He reports that he has current or past drug history. Drug: Marijuana.  Allergies: No Known Allergies  Medications Prior to Admission  Medication Sig Dispense Refill  . acetaminophen (TYLENOL) 325 MG tablet Take 650 mg by mouth daily as needed for moderate pain.    Marland Kitchen gabapentin (NEURONTIN) 300 MG capsule Take 1 capsule (300 mg total) by mouth 3 (three) times daily. 90 capsule 0  . methocarbamol (ROBAXIN) 500 MG tablet Take 2 tablets (1,000 mg total) by mouth every 6 (six) hours as needed for muscle spasms. 30 tablet 0  . oxyCODONE (OXY IR/ROXICODONE) 5 MG immediate release tablet Take 1 tablet (5 mg total) by mouth every 4 (four) hours as needed for moderate pain, severe pain or breakthrough pain. 30 tablet 0  . pantoprazole (PROTONIX) 40 MG tablet Take 1 tablet (40 mg total) by mouth 2 (two) times daily. (Patient not taking: Reported on 09/25/2017)      No results found for this  or any previous visit (from the past 48 hour(s)). No results found.  Review of Systems  Constitutional: Negative for chills and fever.  Respiratory: Negative for cough and shortness of breath.   Cardiovascular: Negative for chest pain.  Gastrointestinal: Positive for abdominal pain. Negative for nausea and vomiting.  Genitourinary: Negative for dysuria, frequency and urgency.  Skin: Negative for itching and rash.  Neurological: Negative for dizziness and headaches.    Blood pressure (!) 88/67, pulse 93, temperature 98.2 F (36.8 C), temperature source Oral, resp. rate 18, height  6\' 2"  (1.88 m), weight 63.5 kg (140 lb), SpO2 100 %. Physical Exam  Constitutional: He is oriented to person, place, and time. He appears well-developed and well-nourished.  HENT:  Head: Normocephalic and atraumatic.  Eyes: Pupils are equal, round, and reactive to light. Conjunctivae and EOM are normal.  Neck: Neck supple.  Cardiovascular: Normal rate and regular rhythm.  Respiratory: Effort normal. No respiratory distress.  GI: Soft. He exhibits no distension.  Musculoskeletal: Normal range of motion.  Neurological: He is alert and oriented to person, place, and time.  Skin: Skin is warm and dry.     Assessment/Plan 42 year old male with rectal cancer status post diverting ileostomy and prolonged-postoperative small bowel obstruction.  I have recommended closure of his loop ileostomy with laparoscopic evaluation of the abdomen.  Possible laparoscopic lysis of adhesions, possible open, possible small bowel resection, laparoscopic liver ultrasound with possible biopsy.  We have discussed this in detail.  Risks include bleeding, infection and prolonged ileus.  I believe he understands this and wishes to proceed with surgery.  Rosario Adie., MD 2/48/2500, 11:32 AM

## 2017-09-27 NOTE — Op Note (Signed)
09/27/2017  2:39 PM  PATIENT:  Jacob Galvan.  42 y.o. male  Patient Care Team: Patient, No Pcp Per as PCP - General (General Practice) Leighton Ruff, MD as Consulting Physician (General Surgery) Ladell Pier, MD as Consulting Physician (Oncology)  PRE-OPERATIVE DIAGNOSIS:  PARTIAL SBO, DIVERTING ILEOSTOMY, LIVER MASS  POST-OPERATIVE DIAGNOSIS:   PARTIAL SBO, DIVERTING ILEOSTOMY, LIVER MASS   PROCEDURE:   ILEOSTOMY REVERSAL DIAGNOSTIC LAPAROSCOPY LAPAROSCOPIC ULTRASOUND GUIDED LIVER BIOPSY   Surgeon(s): Leighton Ruff, MD Stark Klein, MD  ASSISTANT: Dr Barry Dienes   ANESTHESIA:   local and general  EBL:  Total I/O In: 1000 [I.V.:1000] Out: 175 [Urine:125; Blood:50]  DRAINS: none   SPECIMEN:  Source of Specimen:  liver biopsy, ileostomy  DISPOSITION OF SPECIMEN:  PATHOLOGY  COUNTS:  YES  PLAN OF CARE: Admit to inpatient   PATIENT DISPOSITION:  PACU - hemodynamically stable.  INDICATION: 42 y.o. M with prolonged post op SBO after diverting ileostomy and LAR for rectal cancer.     OR FINDINGS: significant pelvic adhesions but no signs of obstruction, Liver mass  DESCRIPTION: the patient was identified in the preoperative holding area and taken to the OR where they were laid supine on the operating room table.  General anesthesia was induced without difficulty. SCDs were also noted to be in place prior to the initiation of anesthesia.  The patient was then prepped and draped in the usual sterile fashion.   A surgical timeout was performed indicating the correct patient, procedure, positioning and need for preoperative antibiotics.   I began by making an incision around the ileostomy site using the electrocautery device.  Dissection was carried down through subcutaneous tissues using blunt dissection electrocautery.  The fascia was identified and I was able to bluntly enter into the peritoneal cavity.  The ileostomy was swept away from the abdominal wall and  transected using electrocautery to free this completely circumferentially.  Once this was done I made a small enterotomy in both portions of the bowel and placed a 75 mm GIA blue load stapler.  An anastomosis was created.  Hemostasis was good at the staple line.  I then divided the ileostomy as well as closed the common enterotomy channel with a TA blue load 60 mm stapler.  The mesentery was tied off using interrupted 3-0 silk suture ligatures.  The corner of the ileostomy staple line was oversewn using 3-0 silk suture.  An anti-tension suture was placed as well using 3-0 silk.  The anastomosis was widely patent and hemostatic.  This was then placed back into the abdomen.  An [place] wound protector was placed.  The cath was placed on the wound protector and the abdomen was insufflated to approximately 15 mmHg.  Additional 5 mm ports were placed in the right upper quadrant as well as left lower quadrant and left upper quadrant under direct visualization.  The patient was then placed in Trendelenburg and I evaluated the patient's abdominal adhesions.  There were dense abdominal adhesions into the pelvis that were not easily freed.  There was no sign of obstruction.  There was no dilated small bowel proximal to this.  I decided to not perform lysis of adhesions and the worry that his postoperative inflammation could create a similar situation as we had after his low anterior resection.  Given his postoperative bowel obstruction, I decided to place an NG tube for decompression after the surgery.  This was done by the anesthesia team and placement was confirmed laparoscopically.  At this point the case was turned over to Dr. Barry Dienes.  She performed a liver ultrasound and biopsy of a liver mass seen on CT scan.  This will be dictated separately.  When she completed her portion of the case, the ports were removed and abdomen was desufflated.  The [place] wound protector was removed.  The ileostomy peritoneum was closed  using a running 0 Vicryl suture.  The fascia of the ileostomy with interrupted #1 Novafil sutures.  The subcutaneous tissue was reapproximated using a 2-0 Vicryl pursestring suture.  The dermal layer was then partially closed using a 2-0 Vicryl pursestring suture.  A Telfa wick was placed in the middle of the wound and a 4 x 4 dressing was placed over top of this.  The remaining port sites were closed using 4-0 Vicryl suture and Dermabond.  The patient was then awakened from anesthesia and sent to the postanesthesia care unit stable condition.  All counts were correct per operating room staff.

## 2017-09-27 NOTE — Op Note (Signed)
PRE-OPERATIVE DIAGNOSIS: left liver mass at dome of liver  POST-OPERATIVE DIAGNOSIS:  Same  Indication for procedure:  42 yo M with h/o rectal cancer dx 08/2016 as a [id number].  There were two indeterminate liver lesions on MRI at that point.  He had IV chemotherapy, then chemoradiation.  This was followed by minimally invasive LAR with diverting ileostomy. Final path was [id number].  The liver lesion was not seen at time of LAR.  The lesions have persisted, so surgical biopsy was requested at time of ileostomy takedown.    PROCEDURE:  Procedure(s): Intraoperative liver ultrasound and liver wedge biopsy  SURGEON:  Surgeon(s): Jacob Klein, MD  ASSIST: Jacob Ruff, MD  ANESTHESIA:   general  DRAINS: none   LOCAL MEDICATIONS USED:  NONE  SPECIMEN:  Source of Specimen:  liver dome mass  DISPOSITION OF SPECIMEN:  PATHOLOGY  COUNTS:  YES  DICTATION: .Dragon Dictation  PLAN OF CARE: Admit to inpatient   PATIENT DISPOSITION:  left in OR with Dr. Marcello Moores to complete case.    FINDINGS:  Firm nodule at base of falciform near diaphragm.  Highly suspicious for cancer.    EBL: min  PROCEDURE:  Patient was identified and a timeout was performed after Dr. Marcello Moores completed her ileostomy takedown.  Laparoscopic equipment was left in place for visualization.  The laparoscopic liver ultrasound probe was obtained and set up.  A firm mass was seen on the surface of the liver after careful evaluation.  This was at the base of the falciform at the dome of the liver near the diaphragm.  It is easy to see why it would have been missed.  It was not changed in color at all.  It was just very slight defect in the contour of the liver in that location.  The liver was evaluated with the ultrasound probe in sequential fashion.  I started on the left side and evaluated the left portal structures and left lateral segment followed by the right kidney and hepatic vein insertion sites.  The right portal was  evaluated and the portal bifurcation was seen.  The mass was difficult to evaluate with the ultrasound probe as it was superficial and deformed the capsule of the liver.  It was also irregular.  A picture is inserted into the document below.  The harmonic scalpel was used to take down the falciform.  The diaphragmatic attachments anteriorly were dissected adequately to better visualize the mass.  A small wedge of liver incorporating the mass was resected. This was done by scoring the liver capsule with the cautery this was done very carefully. The harmonic was used to divide the liver parenchyma.   It did appear that the left branch of the hepatic vein was not too far below the site of the division.  The liver capsule was cauterized, but the deep portion of where the mass was removed was not coagulated.  There was no significant oozing from this area.  A piece of SNOW hemostatic agent was placed over the liver defect.  2 clips were placed just at the surface of the liver adjacent to where the mass was removed and where there appeared to be additional malignancy below.  This was done to facilitate potential stereotactic radiation in the future.  After it was confirmed that there was no bleeding present, the patient was left in the operating room with Dr. Marcello Moores to complete the  abdominal closure and to evaluate the bowel that was placed back together.  Patient tolerated  the procedure well.

## 2017-09-28 LAB — BASIC METABOLIC PANEL
ANION GAP: 12 (ref 5–15)
BUN: 7 mg/dL (ref 6–20)
CHLORIDE: 102 mmol/L (ref 101–111)
CO2: 26 mmol/L (ref 22–32)
Calcium: 8.5 mg/dL — ABNORMAL LOW (ref 8.9–10.3)
Creatinine, Ser: 0.74 mg/dL (ref 0.61–1.24)
GFR calc non Af Amer: >60 mL/min (ref >60)
GLUCOSE: 132 mg/dL — AB (ref 65–99)
Potassium: 3.7 mmol/L (ref 3.5–5.1)
Sodium: 140 mmol/L (ref 135–145)

## 2017-09-28 LAB — CBC
HCT: 33.3 % — ABNORMAL LOW (ref 39.0–52.0)
HEMOGLOBIN: 11.1 g/dL — AB (ref 13.0–17.0)
MCH: 28.8 pg (ref 26.0–34.0)
MCHC: 33.3 g/dL (ref 30.0–36.0)
MCV: 86.3 fL (ref 78.0–100.0)
Platelets: 313 10*3/uL (ref 150–400)
RBC: 3.86 MIL/uL — AB (ref 4.22–5.81)
RDW: 16.1 % — ABNORMAL HIGH (ref 11.5–15.5)
WBC: 13.9 10*3/uL — ABNORMAL HIGH (ref 4.0–10.5)

## 2017-09-28 MED ORDER — SODIUM CHLORIDE 0.9% FLUSH
10.0000 mL | INTRAVENOUS | Status: DC | PRN
Start: 2017-09-28 — End: 2017-10-03

## 2017-09-28 MED ORDER — KETOROLAC TROMETHAMINE 15 MG/ML IJ SOLN
30.0000 mg | Freq: Three times a day (TID) | INTRAMUSCULAR | Status: DC
  Administered 2017-09-28 – 2017-10-02 (×12): 30 mg via INTRAVENOUS
  Filled 2017-09-28 (×12): qty 2

## 2017-09-28 NOTE — Progress Notes (Signed)
Met with pt on inpatient unit to offer support and encouragement.  

## 2017-09-28 NOTE — Progress Notes (Signed)
1 Day Post-Op ileostomy reversal Subjective: C/o incisional pain  Objective: Vital signs in last 24 hours: Temp:  [97.6 F (36.4 C)-98.4 F (36.9 C)] 98.2 F (36.8 C) (06/13 0931) Pulse Rate:  [49-85] 81 (06/13 0931) Resp:  [12-18] 16 (06/13 0931) BP: (98-167)/(69-99) 104/76 (06/13 0931) SpO2:  [97 %-100 %] 100 % (06/13 0931) Weight:  [64.7 kg (142 lb 10.2 oz)] 64.7 kg (142 lb 10.2 oz) (06/13 0152)   Intake/Output from previous day: 06/12 0701 - 06/13 0700 In: 3166.3 [I.V.:3066.3; IV Piggyback:100] Out: 2185 [Urine:1685; Emesis/NG output:450; Blood:50] Intake/Output this shift: No intake/output data recorded.   General appearance: alert and cooperative GI: normal findings: soft, appropriately tender  Incision: no significant drainage  Lab Results:  Recent Labs    09/28/17 0615  WBC 13.9*  HGB 11.1*  HCT 33.3*  PLT 313   BMET Recent Labs    09/28/17 0615  NA 140  K 3.7  CL 102  CO2 26  GLUCOSE 132*  BUN 7  CREATININE 0.74  CALCIUM 8.5*   PT/INR No results for input(s): LABPROT, INR in the last 72 hours. ABG No results for input(s): PHART, HCO3 in the last 72 hours.  Invalid input(s): PCO2, PO2  MEDS, Scheduled . acetaminophen  1,000 mg Oral Q6H  . enoxaparin (LOVENOX) injection  40 mg Subcutaneous Q24H  . gabapentin  400 mg Oral TID  . ketorolac  30 mg Intravenous Q8H  . lip balm  1 application Topical BID    Studies/Results: No results found.  Assessment: s/p Procedure(s): ILEOSTOMY REVERSAL LAPAROSCOPIC ULTRASOUND GUIDED LIVER BIOPSY Patient Active Problem List   Diagnosis Date Noted  . Hypotension 09/11/2017  . Hypomagnesemia 09/11/2017  . Dehydration 09/01/2017  . SBO (small bowel obstruction) s/p LOA/ileostomy takedown 09/27/2017 08/31/2017  . Hypokalemia 07/30/2017  . Protein-calorie malnutrition, moderate ([place]) 07/29/2017  . Genetic testing 04/17/2017  . Port-A-Cath in place 04/17/2017  . Family history of colon cancer  03/29/2017  . Family history of melanoma 03/29/2017  . Family history of pancreatic cancer 03/29/2017  . Family history of testicular cancer 03/29/2017  . Rectal adenocarcinoma [id number] s/p LAR/ileostomy 07/20/2017 12/07/2016  . Neoplasm related pain 12/07/2016    Expected post op course  Plan: Cont NG  Ambulate Change dressing daily   LOS: 1 day     .Rosario Adie, [facility], [place] [phone]   09/28/2017 12:28 PM

## 2017-09-29 LAB — CBC
HCT: 31.8 % — ABNORMAL LOW (ref 39.0–52.0)
HEMOGLOBIN: 10.6 g/dL — AB (ref 13.0–17.0)
MCH: 29 pg (ref 26.0–34.0)
MCHC: 33.3 g/dL (ref 30.0–36.0)
MCV: 87.1 fL (ref 78.0–100.0)
PLATELETS: 252 10*3/uL (ref 150–400)
RBC: 3.65 MIL/uL — AB (ref 4.22–5.81)
RDW: 16.3 % — ABNORMAL HIGH (ref 11.5–15.5)
WBC: 7.5 10*3/uL (ref 4.0–10.5)

## 2017-09-29 LAB — BASIC METABOLIC PANEL
ANION GAP: 7 (ref 5–15)
BUN: 6 mg/dL (ref 6–20)
CO2: 32 mmol/L (ref 22–32)
CREATININE: 0.61 mg/dL (ref 0.61–1.24)
Calcium: 8.3 mg/dL — ABNORMAL LOW (ref 8.9–10.3)
Chloride: 101 mmol/L (ref 101–111)
Glucose, Bld: 99 mg/dL (ref 65–99)
Potassium: 3.1 mmol/L — ABNORMAL LOW (ref 3.5–5.1)
SODIUM: 140 mmol/L (ref 135–145)

## 2017-09-29 NOTE — Progress Notes (Signed)
2 Days Post-Op ileostomy reversal, liver biopsy Subjective: Pain better today  Objective: Vital signs in last 24 hours: Temp:  [98.2 F (36.8 C)-98.9 F (37.2 C)] 98.9 F (37.2 C) (06/14 0424) Pulse Rate:  [70-81] 80 (06/14 0424) Resp:  [14-18] 18 (06/14 0424) BP: (100-109)/(66-76) 109/66 (06/14 0424) SpO2:  [100 %] 100 % (06/14 0424) Weight:  [67.3 kg (148 lb 5.9 oz)] 67.3 kg (148 lb 5.9 oz) (06/14 0500)   Intake/Output from previous day: 06/13 0701 - 06/14 0700 In: 1918.8 [P.O.:320; I.V.:1488.8; IV Piggyback:110] Out: 2325 [Urine:975; Emesis/NG output:1350] Intake/Output this shift: No intake/output data recorded.   General appearance: alert and cooperative GI: normal findings: soft, appropriately tender  Incision: no significant drainage  Lab Results:  Recent Labs    09/28/17 0615 09/29/17 0412  WBC 13.9* 7.5  HGB 11.1* 10.6*  HCT 33.3* 31.8*  PLT 313 252   BMET Recent Labs    09/28/17 0615 09/29/17 0412  NA 140 140  K 3.7 3.1*  CL 102 101  CO2 26 32  GLUCOSE 132* 99  BUN 7 6  CREATININE 0.74 0.61  CALCIUM 8.5* 8.3*   PT/INR No results for input(s): LABPROT, INR in the last 72 hours. ABG No results for input(s): PHART, HCO3 in the last 72 hours.  Invalid input(s): PCO2, PO2  MEDS, Scheduled . acetaminophen  1,000 mg Oral Q6H  . enoxaparin (LOVENOX) injection  40 mg Subcutaneous Q24H  . gabapentin  400 mg Oral TID  . ketorolac  30 mg Intravenous Q8H  . lip balm  1 application Topical BID    Studies/Results: No results found.  Assessment: s/p Procedure(s): ILEOSTOMY REVERSAL LAPAROSCOPIC ULTRASOUND GUIDED LIVER BIOPSY Patient Active Problem List   Diagnosis Date Noted  . Hypotension 09/11/2017  . Hypomagnesemia 09/11/2017  . Dehydration 09/01/2017  . SBO (small bowel obstruction) s/p LOA/ileostomy takedown 09/27/2017 08/31/2017  . Hypokalemia 07/30/2017  . Protein-calorie malnutrition, moderate ([place]) 07/29/2017  . Genetic testing  04/17/2017  . Port-A-Cath in place 04/17/2017  . Family history of colon cancer 03/29/2017  . Family history of melanoma 03/29/2017  . Family history of pancreatic cancer 03/29/2017  . Family history of testicular cancer 03/29/2017  . Rectal adenocarcinoma [id number] s/p LAR/ileostomy 07/20/2017 12/07/2016  . Neoplasm related pain 12/07/2016    Expected post op course  Plan: Cont NG until output decreased or pt has bowel function Cont IVF's Ambulate Change dressing daily   LOS: 2 days     .Rosario Adie, [facility], [place]   09/29/2017 7:18 AM

## 2017-09-29 NOTE — Plan of Care (Signed)
  Problem: Education: Goal: Knowledge of General Education information will improve Outcome: Progressing   Problem: Health Behavior/Discharge Planning: Goal: Ability to manage health-related needs will improve Outcome: Progressing   Problem: Clinical Measurements: Goal: Ability to maintain clinical measurements within normal limits will improve Outcome: Progressing Goal: Will remain free from infection Outcome: Progressing Goal: Diagnostic test results will improve Outcome: Progressing Goal: Respiratory complications will improve Outcome: Progressing Goal: Cardiovascular complication will be avoided Outcome: Progressing   Problem: Activity: Goal: Risk for activity intolerance will decrease Outcome: Progressing   Problem: Nutrition: Goal: Adequate nutrition will be maintained Outcome: Progressing   Problem: Coping: Goal: Level of anxiety will decrease Outcome: Progressing   Problem: Elimination: Goal: Will not experience complications related to bowel motility Outcome: Progressing Goal: Will not experience complications related to urinary retention Outcome: Progressing   Problem: Pain Managment: Goal: General experience of comfort will improve Outcome: Progressing   Problem: Safety: Goal: Ability to remain free from injury will improve Outcome: Progressing   Problem: Skin Integrity: Goal: Risk for impaired skin integrity will decrease Outcome: Progressing   Problem: Clinical Measurements: Goal: Postoperative complications will be avoided or minimized Outcome: Progressing

## 2017-09-29 NOTE — Progress Notes (Signed)
RN noted pt in the hallway walking. RN asked the tech if she had disconnected pt from wall suction, Tech told RN no she did not. RN spoke with pt and asked him did he take himself off of wall suction? He stated yes I did I was getting tired of being in my room. Rn educated pt that he should not have done that and even if he wanted to be unhooked he must call out for help to do so. RN explained that he could have accidentally moved the NG tube from the right place and that could cause problems or worst he could have accidentally removed the whole NG tube and RN would have to replace it again. Pt stated that he would not do it again. RN informed Night shift of all the incidents that has happen this shift for Night shift to continue to monitor pt.

## 2017-09-29 NOTE — Progress Notes (Signed)
RN noted when pt's Significant other came into the room with cereal, a jug of cranberry juice, and 5 bottles of waters to pt. RN has repeatedly educated pt on the importance of being NPO and not drinking or eating while he has SBO. Rn explained that this would just prolong his stay at the hospital if he continues to drink and eat. Pt has been eating peach rings and RN noted that pt would put ice in his ice cup that staff has brought him. Pt stated that he feels fine and that he is ready to get out of the hospital. RN told pt that he needs to comply with doctors orders in order to get better and resolve the SBO. Pt is in bed playing his videos games at this time. Will continue to monitor pt's condition at this time.

## 2017-09-29 NOTE — Progress Notes (Signed)
Pt ambulating several times a day in halls. Pt states he is "ready to eat", informed pt he was not cleared to eat yet. Pt states "im going to drink gingerale" . Pt only cleared for ice chips but pt drinking cups of water. NGT in place at [facility]. Educated pt on intake.

## 2017-09-30 NOTE — Progress Notes (Signed)
Patient ID: Jacob Amass., male   DOB: 1975/07/25, 42 y.o.   MRN: [id number] [facility] Progress Note:   3 Days Post-Op  Subjective: Mental status is clear and very active Objective: Vital signs in last 24 hours: Temp:  [98.4 F (36.9 C)-98.7 F (37.1 C)] 98.7 F (37.1 C) (06/15 0621) Pulse Rate:  [78-80] 78 (06/15 0621) Resp:  [16] 16 (06/15 0621) BP: (100-103)/(67-69) 100/67 (06/15 0621) SpO2:  [100 %] 100 % (06/15 0621) Weight:  [69.1 kg (152 lb 5.4 oz)] 69.1 kg (152 lb 5.4 oz) (06/15 0500)  Intake/Output from previous day: 06/14 0701 - 06/15 0700 In: 1390 [P.O.:200; I.V.:1190] Out: 3700 [Urine:1200; Emesis/NG output:2500] Intake/Output this shift: No intake/output data recorded.  Physical Exam: Work of breathing is normal;  He wants his NG out (he has been drinking around it) but he is passed a lot of flatus  Lab Results:  Results for orders placed or performed during the hospital encounter of 09/27/17 (from the past 48 hour(s))  Basic metabolic panel     Status: Abnormal   Collection Time: 09/29/17  4:12 AM  Result Value Ref Range   Sodium 140 135 - 145 mmol/L   Potassium 3.1 (L) 3.5 - 5.1 mmol/L   Chloride 101 101 - 111 mmol/L   CO2 32 22 - 32 mmol/L   Glucose, Bld 99 65 - 99 mg/dL   BUN 6 6 - 20 mg/dL   Creatinine, Ser 0.61 0.61 - 1.24 mg/dL   Calcium 8.3 (L) 8.9 - 10.3 mg/dL   GFR calc non Af Amer >60 >60 mL/min   GFR calc Af Amer >60 >60 mL/min    Comment: (NOTE) The eGFR has been calculated using the CKD EPI equation. This calculation has not been validated in all clinical situations. eGFR's persistently <60 mL/min signify possible Chronic Kidney Disease.    Anion gap 7 5 - 15    Comment: Performed at [facility], [place] [address], [place], [place] [address]  CBC     Status: Abnormal   Collection Time: 09/29/17  4:12 AM  Result Value Ref Range   WBC 7.5 4.0 - 10.5 K/uL   RBC 3.65 (L) 4.22 - 5.81 MIL/uL   Hemoglobin 10.6 (L)  13.0 - 17.0 g/dL   HCT 31.8 (L) 39.0 - 52.0 %   MCV 87.1 78.0 - 100.0 fL   MCH 29.0 26.0 - 34.0 pg   MCHC 33.3 30.0 - 36.0 g/dL   RDW 16.3 (H) 11.5 - 15.5 %   Platelets 252 150 - 400 K/uL    Comment: Performed at [facility], [place] [address] [place], [place] [address]    Radiology/Results: No results found.  Anti-infectives: Anti-infectives (From admission, onward)   Start     Dose/Rate Route Frequency Ordered Stop   09/27/17 2200  cefoTEtan (CEFOTAN) 2 g in sodium chloride 0.9 % 100 mL IVPB     2 g 200 mL/hr over 30 Minutes Intravenous Every 12 hours 09/27/17 1632 09/27/17 2135   09/27/17 1030  cefoTEtan (CEFOTAN) 2 g in sodium chloride 0.9 % 100 mL IVPB     2 g 200 mL/hr over 30 Minutes Intravenous On call to O.R. 09/27/17 1022 09/27/17 1304      Assessment/Plan: Problem List: Patient Active Problem List   Diagnosis Date Noted  . Hypotension 09/11/2017  . Hypomagnesemia 09/11/2017  . Dehydration 09/01/2017  . SBO (small bowel obstruction) s/p LOA/ileostomy takedown 09/27/2017 08/31/2017  .  Hypokalemia 07/30/2017  . Protein-calorie malnutrition, moderate ([place]) 07/29/2017  . Genetic testing 04/17/2017  . Port-A-Cath in place 04/17/2017  . Family history of colon cancer 03/29/2017  . Family history of melanoma 03/29/2017  . Family history of pancreatic cancer 03/29/2017  . Family history of testicular cancer 03/29/2017  . Rectal adenocarcinoma [id number] s/p LAR/ileostomy 07/20/2017 12/07/2016  . Neoplasm related pain 12/07/2016    Will discontinue NG and start clear liquids 3 Days Post-Op    LOS: 3 days   Matt B. Hassell Done, MD, [facility] [phone] beeper [phone]  09/30/2017 9:55 AM

## 2017-10-01 NOTE — Progress Notes (Signed)
Patient ID: Jacob Amass., male   DOB: 03/10/1976, 42 y.o.   MRN: [id number] [facility] Progress Note:   4 Days Post-Op  Subjective: Mental status is alert and excited;  Wants more to eat Objective: Vital signs in last 24 hours: Temp:  [98.1 F (36.7 C)-98.6 F (37 C)] 98.1 F (36.7 C) (06/16 0557) Pulse Rate:  [63-97] 63 (06/16 0557) Resp:  [14-17] 17 (06/16 0557) BP: (93-103)/(58-74) 93/58 (06/16 0557) SpO2:  [100 %] 100 % (06/16 0557) Weight:  [69.6 kg (153 lb 7 oz)] 69.6 kg (153 lb 7 oz) (06/16 0624)  Intake/Output from previous day: 06/15 0701 - 06/16 0700 In: 1090 [P.O.:480; I.V.:610] Out: 950 [Urine:950] Intake/Output this shift: Total I/O In: 180 [P.O.:180] Out: -   Physical Exam: Work of breathing is not labored.  Hungry  Lab Results:  No results found for this or any previous visit (from the past 48 hour(s)).  Radiology/Results: No results found.  Anti-infectives: Anti-infectives (From admission, onward)   Start     Dose/Rate Route Frequency Ordered Stop   09/27/17 2200  cefoTEtan (CEFOTAN) 2 g in sodium chloride 0.9 % 100 mL IVPB     2 g 200 mL/hr over 30 Minutes Intravenous Every 12 hours 09/27/17 1632 09/27/17 2135   09/27/17 1030  cefoTEtan (CEFOTAN) 2 g in sodium chloride 0.9 % 100 mL IVPB     2 g 200 mL/hr over 30 Minutes Intravenous On call to O.R. 09/27/17 1022 09/27/17 1304      Assessment/Plan: Problem List: Patient Active Problem List   Diagnosis Date Noted  . Hypotension 09/11/2017  . Hypomagnesemia 09/11/2017  . Dehydration 09/01/2017  . SBO (small bowel obstruction) s/p LOA/ileostomy takedown 09/27/2017 08/31/2017  . Hypokalemia 07/30/2017  . Protein-calorie malnutrition, moderate ([place]) 07/29/2017  . Genetic testing 04/17/2017  . Port-A-Cath in place 04/17/2017  . Family history of colon cancer 03/29/2017  . Family history of melanoma 03/29/2017  . Family history of pancreatic cancer 03/29/2017  . Family history of  testicular cancer 03/29/2017  . Rectal adenocarcinoma [id number] s/p LAR/ileostomy 07/20/2017 12/07/2016  . Neoplasm related pain 12/07/2016    Diet advanced to solids.   4 Days Post-Op    LOS: 4 days   Matt B. Hassell Done, MD, [facility] [phone] beeper [phone]  10/01/2017 9:56 AM

## 2017-10-02 ENCOUNTER — Inpatient Hospital Stay (HOSPITAL_COMMUNITY): Payer: Medicaid Other

## 2017-10-02 MED ORDER — HYDROMORPHONE HCL 1 MG/ML IJ SOLN: 1.0000 mg | INTRAMUSCULAR | Status: DC | PRN

## 2017-10-02 MED ORDER — OXYCODONE HCL 5 MG PO TABS
10.0000 mg | ORAL_TABLET | Freq: Four times a day (QID) | ORAL | Status: DC | PRN
  Administered 2017-10-02 – 2017-10-03 (×5): 10 mg via ORAL
  Filled 2017-10-02 (×6): qty 2

## 2017-10-02 MED ORDER — GADOBENATE DIMEGLUMINE 529 MG/ML IV SOLN
15.0000 mL | Freq: Once | INTRAVENOUS | Status: AC | PRN
  Administered 2017-10-02: 14 mL via INTRAVENOUS

## 2017-10-02 MED ORDER — LOPERAMIDE HCL 2 MG PO CAPS: 2.0000 mg | ORAL_CAPSULE | Freq: Three times a day (TID) | ORAL | Status: DC | PRN

## 2017-10-02 MED ORDER — HYDROMORPHONE HCL 1 MG/ML IJ SOLN
1.0000 mg | INTRAMUSCULAR | Status: DC | PRN
  Administered 2017-10-02 – 2017-10-03 (×7): 1 mg via INTRAVENOUS
  Filled 2017-10-02 (×7): qty 1

## 2017-10-02 NOTE — Progress Notes (Signed)
5 Days Post-Op ileostomy reversal, liver biopsy Subjective: Pain controlled.  Having BM's, tolerating a diet  Objective: Vital signs in last 24 hours: Temp:  [98.4 F (36.9 C)-98.6 F (37 C)] 98.4 F (36.9 C) (06/17 0556) Pulse Rate:  [62-75] 62 (06/17 0556) Resp:  [14-17] 17 (06/17 0556) BP: (96-109)/(61-69) 96/61 (06/17 0556) SpO2:  [99 %-100 %] 100 % (06/17 0556)   Intake/Output from previous day: 06/16 0701 - 06/17 0700 In: 4240 [P.O.:1240; I.V.:3000] Out: 1000 [Urine:1000] Intake/Output this shift: No intake/output data recorded.  General appearance: alert and cooperative GI: normal findings: soft, appropriately tender  Incision: no significant drainage  Lab Results:  No results for input(s): WBC, HGB, HCT, PLT in the last 72 hours. BMET No results for input(s): NA, K, CL, CO2, GLUCOSE, BUN, CREATININE, CALCIUM in the last 72 hours. PT/INR No results for input(s): LABPROT, INR in the last 72 hours. ABG No results for input(s): PHART, HCO3 in the last 72 hours.  Invalid input(s): PCO2, PO2  MEDS, Scheduled . acetaminophen  1,000 mg Oral Q6H  . enoxaparin (LOVENOX) injection  40 mg Subcutaneous Q24H  . gabapentin  400 mg Oral TID  . lip balm  1 application Topical BID    Studies/Results: No results found.  Assessment: s/p Procedure(s): ILEOSTOMY REVERSAL LAPAROSCOPIC ULTRASOUND GUIDED LIVER BIOPSY Patient Active Problem List   Diagnosis Date Noted  . Hypotension 09/11/2017  . Hypomagnesemia 09/11/2017  . Dehydration 09/01/2017  . SBO (small bowel obstruction) s/p LOA/ileostomy takedown 09/27/2017 08/31/2017  . Hypokalemia 07/30/2017  . Protein-calorie malnutrition, moderate ([place]) 07/29/2017  . Genetic testing 04/17/2017  . Port-A-Cath in place 04/17/2017  . Family history of colon cancer 03/29/2017  . Family history of melanoma 03/29/2017  . Family history of pancreatic cancer 03/29/2017  . Family history of testicular cancer 03/29/2017  . 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 Neoplasm related pain 12/07/2016    Expected post op course  Plan: Cont diet  PO pain meds SL IVF's Ambulate Change dressing daily Hopefully d/c tom   LOS: 5 days     .Rosario Adie, [facility], [place]   10/02/2017 8:33 AM

## 2017-10-02 NOTE — Progress Notes (Signed)
5 Days Post-Op   Subjective/Chief Complaint: Doing well.  Pain controlled.  Liver biopsy + for adenoca.     Objective: Vital signs in last 24 hours: Temp:  [98.4 F (36.9 C)-98.6 F (37 C)] 98.4 F (36.9 C) (06/17 0556) Pulse Rate:  [62-75] 62 (06/17 0556) Resp:  [14-17] 17 (06/17 0556) BP: (96-109)/(61-69) 96/61 (06/17 0556) SpO2:  [99 %-100 %] 100 % (06/17 0556) Last BM Date: 10/01/17  Intake/Output from previous day: 06/16 0701 - 06/17 0700 In: 4240 [P.O.:1240; I.V.:3000] Out: 1000 [Urine:1000] Intake/Output this shift: No intake/output data recorded.  General appearance: alert, cooperative and no distress Resp: breathing comfortably GI: soft, non distended, non tender  Lab Results:  No results for input(s): WBC, HGB, HCT, PLT in the last 72 hours. BMET No results for input(s): NA, K, CL, CO2, GLUCOSE, BUN, CREATININE, CALCIUM in the last 72 hours. PT/INR No results for input(s): LABPROT, INR in the last 72 hours. ABG No results for input(s): PHART, HCO3 in the last 72 hours.  Invalid input(s): PCO2, PO2  Studies/Results: No results found.  Anti-infectives: Anti-infectives (From admission, onward)   Start     Dose/Rate Route Frequency Ordered Stop   09/27/17 2200  cefoTEtan (CEFOTAN) 2 g in sodium chloride 0.9 % 100 mL IVPB     2 g 200 mL/hr over 30 Minutes Intravenous Every 12 hours 09/27/17 1632 09/27/17 2135   09/27/17 1030  cefoTEtan (CEFOTAN) 2 g in sodium chloride 0.9 % 100 mL IVPB     2 g 200 mL/hr over 30 Minutes Intravenous On call to O.R. 09/27/17 1022 09/27/17 1304      Assessment/Plan: s/p Procedure(s): ILEOSTOMY REVERSAL (N/A) LAPAROSCOPIC ULTRASOUND GUIDED LIVER BIOPSY (N/A) Plan for discharge tomorrow MR liver to better assess residual metastatic burden.    LOS: 5 days    Jacob Galvan 10/02/2017

## 2017-10-03 ENCOUNTER — Other Ambulatory Visit: Payer: Self-pay | Admitting: General Surgery

## 2017-10-03 MED ORDER — OXYCODONE HCL 5 MG PO TABS: 5.0000 mg | ORAL_TABLET | Freq: Four times a day (QID) | ORAL | 0 refills | Status: DC | PRN

## 2017-10-03 MED FILL — oxyCODONE HCL 5 MG TABS: 5 | 5 days supply | Qty: 40 | Fill #0

## 2017-10-03 NOTE — Discharge Instructions (Signed)
ABDOMINAL SURGERY: POST OP INSTRUCTIONS ° °1. DIET: Follow a light bland diet the first 24 hours after arrival home, such as soup, liquids, crackers, etc.  Be sure to include lots of fluids daily.  Avoid fast food or heavy meals as your are more likely to get nauseated.  Do not eat any uncooked fruits or vegetables for the next 2 weeks as your colon heals. °2. Take your usually prescribed home medications unless otherwise directed. °3. PAIN CONTROL: °a. Pain is best controlled by a usual combination of three different methods TOGETHER: °i. Ice/Heat °ii. Over the counter pain medication °iii. Prescription pain medication °b. Most patients will experience some swelling and bruising around the incisions.  Ice packs or heating pads (30-60 minutes up to 6 times a day) will help. Use ice for the first few days to help decrease swelling and bruising, then switch to heat to help relax tight/sore spots and speed recovery.  Some people prefer to use ice alone, heat alone, alternating between ice & heat.  Experiment to what works for you.  Swelling and bruising can take several weeks to resolve.   °c. It is helpful to take an over-the-counter pain medication regularly for the first few weeks.  Choose one of the following that works best for you: °i. Naproxen (Aleve, etc)  Two 220mg tabs twice a day °ii. Ibuprofen (Advil, etc) Three 200mg tabs four times a day (every meal & bedtime) °iii. Acetaminophen (Tylenol, etc) 500-650mg four times a day (every meal & bedtime) °d. A  prescription for pain medication (such as oxycodone, hydrocodone, etc) should be given to you upon discharge.  Take your pain medication as prescribed.  °i. If you are having problems/concerns with the prescription medicine (does not control pain, nausea, vomiting, rash, itching, etc), please call us [phone] to see if we need to switch you to a different pain medicine that will work better for you and/or control your side effect better. °ii. If you  need a refill on your pain medication, please contact your pharmacy.  They will contact our office to request authorization. Prescriptions will not be filled after 5 pm or on week-ends. °4. Avoid getting constipated.  Between the surgery and the pain medications, it is common to experience some constipation.  Increasing fluid intake and taking a fiber supplement (such as Metamucil, Citrucel, FiberCon, MiraLax, etc) 1-2 times a day regularly will usually help prevent this problem from occurring.  A mild laxative (prune juice, Milk of Magnesia, MiraLax, etc) should be taken according to package directions if there are no bowel movements after 48 hours.   °5. Watch out for diarrhea.  If you have many loose bowel movements, simplify your diet to bland foods & liquids for a few days.  Stop any stool softeners and decrease your fiber supplement.  Switching to mild anti-diarrheal medications (Kayopectate, Pepto Bismol) can help.  If this worsens or does not improve, please call us. °6. Wash / shower every day.  You may shower over the incision / wound.  Avoid baths until the skin is fully healed.  Continue to shower over incision(s) after the dressing is off. °7. Remove your waterproof bandages 5 days after surgery.  You may leave the incision open to air.  You may replace a dressing/Band-Aid to cover the incision for comfort if you wish. °8. ACTIVITIES as tolerated:   °a. You may resume regular (light) daily activities beginning the next day--such as daily self-care, walking, climbing stairs--gradually increasing activities as   tolerated.  If you can walk 30 minutes without difficulty, it is safe to try more intense activity such as jogging, treadmill, bicycling, low-impact aerobics, swimming, etc. b. Save the most intensive and strenuous activity for last such as sit-ups, heavy lifting, contact sports, etc  Refrain from any heavy lifting or straining until you are off narcotics for pain control.   c. DO NOT PUSH THROUGH  PAIN.  Let pain be your guide: If it hurts to do something, don't do it.  Pain is your body warning you to avoid that activity for another week until the pain goes down. d. You may drive when you are no longer taking prescription pain medication, you can comfortably wear a seatbelt, and you can safely maneuver your car and apply brakes. e. Dennis Bast may have sexual intercourse when it is comfortable.  9. FOLLOW UP in our office a. Please call [facility] at [phone] to set up an appointment to see your surgeon in the office for a follow-up appointment approximately 1-2 weeks after your surgery. b. Make sure that you call for this appointment the day you arrive home to insure a convenient appointment time. 10. IF YOU HAVE DISABILITY OR FAMILY LEAVE FORMS, BRING THEM TO THE OFFICE FOR PROCESSING.  DO NOT GIVE THEM TO YOUR DOCTOR.   WHEN TO CALL us [phone]: 1. Poor pain control 2. Reactions / problems with new medications (rash/itching, nausea, etc)  3. Fever over 101.5 F (38.5 C) 4. Inability to urinate 5. Nausea and/or vomiting 6. Worsening swelling or bruising 7. Continued bleeding from incision. 8. Increased pain, redness, or drainage from the incision  The clinic staff is available to answer your questions during regular business hours (8:30am-5pm).  Please dont hesitate to call and ask to speak to one of our nurses for clinical concerns.   A surgeon from [facility] is always on call at the hospitals   If you have a medical emergency, go to the nearest emergency room or call 911.    [facility], [place], [place], [place], [place]  [address] ? MAIN: [phone] ? TOLL FREE: [phone] ? FAX [phone] [email or web address]    Diet Since many people have frequent bowel movements right after the operation, following a diet can help reduce the number of stools. Incorporate new foods into your diet one at a time to see how  they affect your stool output. You may find that some foods give you trouble initially, but that you can tolerate them better later.  Do not skip meals. This tends to make the stools more irritating and loose.  Balancing starches with foods that tend to give diarrhea is also helpful in controlling the frequency of bowel movements.  Once your colon has been removed, you will need more salt until your body adjusts to not having a colon. Pretzels and corn chips are good snacks.  Hot and spicy foods will probably burn on the way out and should be avoided if your anal area feels irritated.  Seeds and nuts also can be irritating.  Within three to nine months after surgery, your body will have started to adjust to not having a rectum. At this point, you should try to eat all types of foods and see how they affect you. Some patients have tried a low glycemic index diet, which includes foods that are high in fiber and cause a slow rise in blood sugar, to help control their bowel movements. Resources  on the Internet and at your [employer] are available for more information on this type of diet.  Fluids are important to prevent dehydration. Drink enough fluid so your urine is light yellow in color. Avoid fruit juices, carbonated beverages, drinks with caffeine and straws (swallowed air increases gas). Rather than drinking fluids with your meal, try drinking fluids at the end of your meal, which may help slow down your stool.  Many people ask about vitamin supplements after their operation when they are limiting fruits and vegetables. It is fine to take vitamins, but they should be chewable or in liquid form, otherwise they may not be fully absorbed.  Stool Frequency According to a survey of [facility] patients who have had an ileoanal reservoir procedure and have a J-pouch, about half of the patients have between five to eight stools a day. About 30 percent have nine to 12 stools a day. Older patients -- those over 67  -- have more stools than younger patients. Less than 8 percent of patients have less than four stools a day. About 9 percent have over 13 stools a day. As the reservoir adapts and stretches to its normal capacity, the number of stools you have per day should decrease. You will probably have many stools while you are in the hospital, but these should lessen by the time you go home. Often the first stools are without your control while in the hospital, though this is resolved before you go home. The biggest increase in stools usually occurs after you begin eating. You can expect your number of bowel movements to decrease gradually. The stools will range from watery to a paste-like consistency. By watching your diet, you can avoid foods that tend to produce loose stools. The more frequent your bowel movements, the more itching and burning you will have around your anus. A combined approach of diet and medications, such as Imodium and Lomotil, may help decrease your number of bowel movements. Most people need to take these medications more frequently right after having their ileoanal reservoir procedure, but only use them occasionally as their reservoir function improves. For some people, adding a fiber supplement, such as Metamucil, Benefiber, Konsyl, Citracel or a generic equivalent, and taking them with half the recommended amount of water, can help thicken their stool and reduce the number of bowel movements. Nighttime Stool Frequency Stool frequency at night is a common problem and can be very disruptive of a good night's sleep. More than half of [facility] patients surveyed -- about 63 percent -- get up once or twice per night to pass stool, and about 24 percent of patients awaken three times or more during the night to have a bowel movement. This can be related to eating late, overeating or eating foods known to cause problems. Tips for decreasing the number of stools at night include: Don't eat late. Wait at  least three to four hours after your last meal before going to bed.  Take an anti-diarrheal medication before going to bed.  Eat binding foods at dinner and avoid those foods that tend to cause diarrhea.  Don't overeat at dinner. Diarrhea Diarrhea occurs when your stool is very watery and more frequent than usual. Diarrhea can be caused by eating certain types of foods, eating too much of any food, pouchitis and other illnesses like the flu. If you develop the flu early in your recovery and have diarrhea, you may become dehydrated and need to be hospitalized. Sometimes people have diarrhea because  they don't have the enzyme called lactase needed to digest the sugar in milk products. If you feel bloated and have cramps and gas after drinking milk, try not drinking it and see if symptoms go away. You can also try fermented milk products, such as yogurt, hard cheese and buttermilk, as well as soy or goat's milk. Adding fiber supplements, such as Metamucil, Benefiber, Konsyl, Citracel or the generic equivalent, can help thicken your stool and reduce diarrhea. Gatorade or other sport drinks can also be helpful in treating diarrhea by keeping you hydrated. Skin Care Until your body adjusts to your new internal pouch, you may experience some leakage of stool, especially at night. Liquid stool is very irritating to the skin around the anus, so it is important to keep this area clean and dry. Use soft, white, non-perfumed toilet tissue to blot gently after each bowel movement, drying the skin completely. Do not scratch, rub or wipe the skin. It may help to spray water from a squirt bottle. Some people find drying the skin with a blow dryer helps. For irritated skin around the anus, warm baths are very soothing. Do not apply anything to the skin that burns or irritates. Moist cotton balls may be better for wiping if your skin is very sore. Some people use baby wipes to cleanse themselves. Use a brand without  alcohol or scents. Some people tuck a small piece of toilet tissue or a dry cotton ball near the anal opening to protect their skin from small amounts of leakage. Scented soaps and tissues should not be used since these may cause irritation. A protective ointment may be used after each bowel movement to keep the stool off the skin. You may want to wear a protective pad to keep your clothing clean. Anal itching and burning are often not visible on the outside because the irritated area is actually on the inside. You can look with a mirror to see if your irritation is on the outside skin. Some of the itching and burning is a normal part of the internal healing and will go away in time. Some people have found Pepto Bismol taken by mouth is helpful for the burning. Sometimes people develop a yeast infection around the anal area. It happens more commonly when people are taking antibiotics, or in women around the time of their period. If you get an itchy red rash, you may need an anti-fungal cream or ointment. This is available over the counter. Occasionally people are allergic to the preparations they are using around their anus. This is not very common. Some products contain lanolin -- if you are allergic to this, check the ingredients of ointments before using. Incontinence Mild incontinence or leakage of stool from your J-pouch is a common problem that improves with time as your stool thickens, your pouch stretches and your sphincters become stronger. The incontinence is usually worse at night when your sphincters relax. The looser the stool, the more likely it is to leak. Some people accidentally pass stool when they are passing gas. In time, most people develop an awareness of whether they are passing stool or gas, but initially this can be a problem. It is also important to note that if you develop incontinence later on, you may have pouchitis. According to our survey, 60 percent of people occasionally pass  stool without control, usually at night while in a deep sleep. About 8 percent occasionally pass some liquid stool during the day. About 35 percent notice staining  of stool on their underwear at least once a month during the day or the night. Often this can be related to eating something that causes a problem, having pouchitis, overeating, going to bed soon after eating, or just being in a very deep sleep. Medications, diet and careful skin care are all important during this period of adaptation. A small pad helps absorb leakage. Some people find sleeping on a small towel or pad also is helpful. Before your operation, it is a good idea to buy more cotton underwear.

## 2017-10-03 NOTE — Progress Notes (Signed)
Assessment unchanged. Pt verbalized of dc instructions through teach back including follow up care, when to call the doctor, and medications. Script x 1 given per MD. Discharged via wc to front entrance accompanied by NT and mother.

## 2017-10-03 NOTE — Discharge Summary (Signed)
Physician Discharge Summary  Patient ID: Jacob Galvan. MRN: [id number] DOB/AGE: [birth year] 42 y.o.  Admit date: 09/27/2017 Discharge date: 10/03/2017  Admission Diagnoses: rectal cancer  Discharge Diagnoses:  Principal Problem:   SBO (small bowel obstruction) s/p LOA/ileostomy takedown 09/27/2017 Active Problems:   Rectal adenocarcinoma [id number] s/p LAR/ileostomy 07/20/2017   Protein-calorie malnutrition, moderate ([facility]) stage 4 rectal cancer  Discharged Condition: good  Hospital Course: Pt admitted after liver biopsy and ileostomy reversal.  His diet was advanced once his bowel function returned.  He was discharged to home once his pain was controlled with PO medications.   Consults: None  Significant Diagnostic Studies: labs: cbc, bmet  Treatments: IV hydration, analgesia: oxycodone and surgery: see op note  Discharge Exam: Blood pressure 103/63, pulse 70, temperature 98.4 F (36.9 C), temperature source Oral, resp. rate 18, height 6\' 2"  (1.88 m), weight 71.5 kg (157 lb 10.1 oz), SpO2 100 %. General appearance: alert and cooperative GI: normal findings: soft, non-tender Incision/Wound: clean, dry intact  Disposition: Discharge disposition: 01-Home or Self Care        Allergies as of 10/03/2017   No Known Allergies     Medication List    STOP taking these medications   pantoprazole 40 MG tablet Commonly known as:  PROTONIX     TAKE these medications   acetaminophen 325 MG tablet Commonly known as:  TYLENOL Take 650 mg by mouth daily as needed for moderate pain.   gabapentin 300 MG capsule Commonly known as:  NEURONTIN Take 1 capsule (300 mg total) by mouth 3 (three) times daily.   methocarbamol 500 MG tablet Commonly known as:  ROBAXIN Take 2 tablets (1,000 mg total) by mouth every 6 (six) hours as needed for muscle spasms.   oxyCODONE 5 MG immediate release tablet Commonly known as:  Oxy IR/ROXICODONE Take 1-2 tablets (5-10 mg total) by mouth every 6  (six) hours as needed for moderate pain, severe pain or breakthrough pain. What changed:    how much to take  when to take this      Follow-up Information    Leighton Ruff, MD. Schedule an appointment as soon as possible for a visit in 2 week(s).   Specialty:  General Surgery Contact information: [address] [place]  [id number] [phone]        Stark Klein, MD Follow up.   Specialty:  General Surgery Contact information: [address] [place]  [address] [phone]           Signed: Rosario Adie 10/28/4578, 9:98 AM

## 2017-10-09 MED FILL — oxyCODONE HCL 5 MG TABS: 5 | 7 days supply | Qty: 30 | Fill #0

## 2017-10-18 ENCOUNTER — Inpatient Hospital Stay: Payer: Medicaid Other | Attending: Oncology | Admitting: Nurse Practitioner

## 2017-10-23 MED FILL — oxyCODONE HCL 5 MG TABS: 5 | 7 days supply | Qty: 40 | Fill #0

## 2017-10-23 MED FILL — METHOCARBAMOL 500 MG TABLET: 500 | 6 days supply | Qty: 20 | Fill #0

## 2017-10-23 NOTE — H&P (Signed)
  Jacob Galvan Documented: 10/23/2017 2:24 PM Location: [facility] Patient #: [id number] DOB: 23-May-1975 Single / Language: Jacob Galvan / Race: Black or African American Male   History of Present Illness Jacob Klein MD; 10/23/2017 3:09 PM) The patient is a 42 year old male who presents for a follow-up for Colorectal cancer. Pt is a 42 yo M dx wtih rectal cancer 08/2016. He was found to have 2 indeterminate liver lesions at the time, but MRI showed T4bN2 rectal tumor. He got CAPOX, then folfox and radiation. HE underwent LAR/ileostomy 07/20/17 wtih a ypT3N0 pathology. He had post op SBO 4/19. Throughout this time, the liver lesions was too high to biopsy percutaneously, and wasn't seen at [facility] in April.   He has subsequently had ileostomy takedown and liver biopsy 09/27/17. Biopsy was positive for adenocarcinoma. MR cleared the rest of the liver. He is scheduled for lap resection. He is here to discuss.   He is still having pain s/p ileostomy takedown.     Allergies (Tanisha A. Owens Shark, [place]; 10/23/2017 2:24 PM) No Known Drug Allergies [06/27/2017]: Allergies Reconciled   Medication History (Tanisha A. Owens Shark, RMA; 10/23/2017 2:24 PM) Neomycin Sulfate (500MG  Tablet, 2 (two) Oral SEE NOTE, Taken starting 06/27/2017) Active. (TAKE TWO TABLETS AT 2 PM, 3 PM, AND 10 PM THE DAY PRIOR TO SURGERY) Flagyl (500MG  Tablet, 2 (two) Oral SEE NOTE, Taken starting 06/27/2017) Active. (Take at 2pm, 3pm, and 10pm the day prior to your colon operation) OxyCODONE HCl (5MG  Tablet, 1 (one) Oral every six hours, as needed, Taken starting 10/09/2017) Active. Lidocaine (5% Ointment, 1 External three times daily, as needed, Taken starting 10/20/2017) Active. Medications Reconciled    Review of Systems Jacob Klein MD; 10/23/2017 3:08 PM) All other systems negative  Vitals (Tanisha A. Brown RMA; 10/23/2017 2:24 PM) 10/23/2017 2:24 PM Weight: 154.2 lb Height: 74in Body Surface Area: 1.95 m Body  Mass Index: 19.8 kg/m  Temp.: 98.7F  Pulse: 98 (Regular)  BP: 110/68 (Sitting, Left Arm, Standard)       Physical Exam Jacob Klein MD; 10/23/2017 3:09 PM) General Mental Status-Alert. General Appearance-Consistent with stated age. Hydration-Well hydrated. Voice-Normal.  Chest and Lung Exam Chest and lung exam reveals -quiet, even and easy respiratory effort with no use of accessory muscles and on auscultation, normal breath sounds, no adventitious sounds and normal vocal resonance. Inspection Chest Wall - Normal. Back - normal.  Abdomen Inspection Inspection of the abdomen reveals - No Hernias. Palpation/Percussion Palpation and Percussion of the abdomen reveal - Soft, Non Tender, No Rebound tenderness, No Rigidity (guarding) and No hepatosplenomegaly. Auscultation Auscultation of the abdomen reveals - Bowel sounds normal. Note: incisions healing well.     Assessment & Plan Jacob Klein MD; 10/23/2017 3:09 PM) RECTAL CANCER (C20) Impression: See below.  Planning surgery to make NED. Current Plans Changed OxyCODONE HCl 5 MG Oral Tablet, 1-2 Tablet every eight hours, as needed, #40, 10/23/2017, No Refill. Started Robaxin 500 MG Oral Tablet, 1 (one) Tablet every eight hours, as needed, #20, 10/23/2017, Ref. x1. 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 May have to convert to open.  Discussed risks such as bleeding, infection, damage to adjacent structures, pain, blood clot, and more.  Reviewed possible need for additional procedures or surgeries.  He wishes to proceed.    Signed by Jacob Klein, MD (10/23/2017 3:10 PM)

## 2017-10-27 ENCOUNTER — Encounter (HOSPITAL_COMMUNITY): Payer: Self-pay

## 2017-10-27 NOTE — Patient Instructions (Addendum)
Epimenio Schetter.  10/27/2017   Your procedure is scheduled on: 11-02-17  Report to [facility] Main  Entrance  Report to admitting at 530 AM    Call this number if you have problems the morning of surgery [phone]   Remember: Do not eat food  :After Midnight.NO SOLID FOOD AFTER MIDNIGHT THE NIGHT PRIOR TO SURGERY. NOTHING BY MOUTH EXCEPT CLEAR LIQUIDS UNTIL 3 HOURS PRIOR TO [place] SURGERY. PLEASE FINISH ENSURE DRINK PER SURGEON ORDER 3 HOURS PRIOR TO SCHEDULED SURGERY TIME WHICH NEEDS TO BE COMPLETED AT 430 am..     Take these medicines the morning of surgery with A SIP OF WATER: gabapentin if needed, robaxin if needed, oxycodone if needed              You may not have any metal on your body including hair pins and              piercings  Do not wear jewelry,  lotions, powders or perfumes, deodorant    CLEAR LIQUID DIET   Foods Allowed                                                                     Foods Excluded  Coffee and tea, regular and decaf                             liquids that you cannot  Plain Jell-O in any flavor                                             see through such as: Fruit ices (not with fruit pulp)                                     milk, soups, orange juice  Iced Popsicles                                    All solid food Carbonated beverages, regular and diet                                    Cranberry, grape and apple juices Sports drinks like Gatorade Lightly seasoned clear broth or consume(fat free) Sugar, honey syrup  Sample Menu Breakfast                                Lunch                                     Supper Cranberry juice  Beef broth                            Chicken broth Jell-O                                     Grape juice                           Apple juice Coffee or tea                        Jell-O                                      Popsicle                                                 Coffee or tea                        Coffee or tea  _____________________________________________________________________  .              Men may shave face and neck.   Do not bring valuables to the hospital. [place].  Contacts, dentures or bridgework may not be worn into surgery.  Leave suitcase in the car. After surgery it may be brought to your room.                  Please read over the following fact sheets you were given: _____________________________________________________________________             [facility] - Preparing for Surgery Before surgery, you can play an important role.  Because skin is not sterile, your skin needs to be as free of germs as possible.  You can reduce the number of germs on your skin by washing with CHG (chlorahexidine gluconate) soap before surgery.  CHG is an antiseptic cleaner which kills germs and bonds with the skin to continue killing germs even after washing. Please DO NOT use if you have an allergy to CHG or antibacterial soaps.  If your skin becomes reddened/irritated stop using the CHG and inform your nurse when you arrive at Short Stay. Do not shave (including legs and underarms) for at least 48 hours prior to the first CHG shower.  You may shave your face/neck. Please follow these instructions carefully:  1.  Shower with CHG Soap the night before surgery and the  morning of Surgery.  2.  If you choose to wash your hair, wash your hair first as usual with your  normal  shampoo.  3.  After you shampoo, rinse your hair and body thoroughly to remove the  shampoo.                           4.  Use CHG as you would any other liquid soap.  You can apply chg directly  to the skin and wash  Gently with a scrungie or clean washcloth.  5.  Apply the CHG Soap to your body ONLY FROM THE NECK DOWN.   Do not use on face/ open                           Wound or open  sores. Avoid contact with eyes, ears mouth and genitals (private parts).                       Wash face,  Genitals (private parts) with your normal soap.             6.  Wash thoroughly, paying special attention to the area where your surgery  will be performed.  7.  Thoroughly rinse your body with warm water from the neck down.  8.  DO NOT shower/wash with your normal soap after using and rinsing off  the CHG Soap.                9.  Pat yourself dry with a clean towel.            10.  Wear clean pajamas.            11.  Place clean sheets on your bed the night of your first shower and do not  sleep with pets. Day of Surgery : Do not apply any lotions/deodorants the morning of surgery.  Please wear clean clothes to the hospital/surgery center.  FAILURE TO FOLLOW THESE INSTRUCTIONS MAY RESULT IN THE CANCELLATION OF YOUR SURGERY PATIENT SIGNATURE_________________________________  NURSE SIGNATURE__________________________________  ________________________________________________________________________  WHAT IS A BLOOD TRANSFUSION? Blood Transfusion Information  A transfusion is the replacement of blood or some of its parts. Blood is made up of multiple cells which provide different functions.  Red blood cells carry oxygen and are used for blood loss replacement.  White blood cells fight against infection.  Platelets control bleeding.  Plasma helps clot blood.  Other blood products are available for specialized needs, such as hemophilia or other clotting disorders. BEFORE THE TRANSFUSION  Who gives blood for transfusions?   Healthy volunteers who are fully evaluated to make sure their blood is safe. This is blood bank blood. Transfusion therapy is the safest it has ever been in the practice of medicine. Before blood is taken from a donor, a complete history is taken to make sure that person has no history of diseases nor engages in risky social behavior (examples are intravenous  drug use or sexual activity with multiple partners). The donor's travel history is screened to minimize risk of transmitting infections, such as malaria. The donated blood is tested for signs of infectious diseases, such as HIV and hepatitis. The blood is then tested to be sure it is compatible with you in order to minimize the chance of a transfusion reaction. If you or a relative donates blood, this is often done in anticipation of surgery and is not appropriate for emergency situations. It takes many days to process the donated blood. RISKS AND COMPLICATIONS Although transfusion therapy is very safe and saves many lives, the main dangers of transfusion include:   Getting an infectious disease.  Developing a transfusion reaction. This is an allergic reaction to something in the blood you were given. Every precaution is taken to prevent this. The decision to have a blood transfusion has been considered carefully by your caregiver before blood is given. Blood is not given unless the benefits  outweigh the risks. AFTER THE TRANSFUSION  Right after receiving a blood transfusion, you will usually feel much better and more energetic. This is especially true if your red blood cells have gotten low (anemic). The transfusion raises the level of the red blood cells which carry oxygen, and this usually causes an energy increase.  The nurse administering the transfusion will monitor you carefully for complications. HOME CARE INSTRUCTIONS  No special instructions are needed after a transfusion. You may find your energy is better. Speak with your caregiver about any limitations on activity for underlying diseases you may have. SEEK MEDICAL CARE IF:   Your condition is not improving after your transfusion.  You develop redness or irritation at the intravenous (IV) site. SEEK IMMEDIATE MEDICAL CARE IF:  Any of the following symptoms occur over the next 12 hours:  Shaking chills.  You have a temperature by  mouth above 102 F (38.9 C), not controlled by medicine.  Chest, back, or muscle pain.  People around you feel you are not acting correctly or are confused.  Shortness of breath or difficulty breathing.  Dizziness and fainting.  You get a rash or develop hives.  You have a decrease in urine output.  Your urine turns a dark color or changes to pink, red, or brown. Any of the following symptoms occur over the next 10 days:  You have a temperature by mouth above 102 F (38.9 C), not controlled by medicine.  Shortness of breath.  Weakness after normal activity.  The white part of the eye turns yellow (jaundice).  You have a decrease in the amount of urine or are urinating less often.  Your urine turns a dark color or changes to pink, red, or brown. Document Released: 04/01/2000 Document Revised: 06/27/2011 Document Reviewed: 11/19/2007 [facility] Patient Information 2014 [place], [place].  _______________________________________________________________________

## 2017-10-27 NOTE — Progress Notes (Signed)
ekg 09-11-17 abnormal epic will repeat pre op 10-30-17 Chest xray and chest ct 09-11-17 epic

## 2017-10-30 ENCOUNTER — Other Ambulatory Visit: Payer: Self-pay

## 2017-10-30 ENCOUNTER — Encounter (HOSPITAL_COMMUNITY): Payer: Self-pay

## 2017-10-30 ENCOUNTER — Encounter (HOSPITAL_COMMUNITY)
Admission: RE | Admit: 2017-10-30 | Discharge: 2017-10-30 | Disposition: A | Discharge status: Home / Self Care | Payer: Medicaid Other | Source: Ambulatory Visit | Attending: General Surgery | Admitting: General Surgery

## 2017-10-30 DIAGNOSIS — Z01812 Encounter for preprocedural laboratory examination: Secondary | ICD-10-CM | POA: Insufficient documentation

## 2017-10-30 DIAGNOSIS — R9431 Abnormal electrocardiogram [ECG] [EKG]: Secondary | ICD-10-CM | POA: Diagnosis not present

## 2017-10-30 DIAGNOSIS — C787 Secondary malignant neoplasm of liver and intrahepatic bile duct: Secondary | ICD-10-CM | POA: Diagnosis not present

## 2017-10-30 DIAGNOSIS — C189 Malignant neoplasm of colon, unspecified: Secondary | ICD-10-CM | POA: Diagnosis not present

## 2017-10-30 DIAGNOSIS — Z01810 Encounter for preprocedural cardiovascular examination: Secondary | ICD-10-CM | POA: Insufficient documentation

## 2017-10-30 LAB — COMPREHENSIVE METABOLIC PANEL
ALT: 19 U/L (ref 0–44)
AST: 24 U/L (ref 15–41)
Albumin: 3.8 g/dL (ref 3.5–5.0)
Alkaline Phosphatase: 49 U/L (ref 38–126)
Anion gap: 11 (ref 5–15)
BUN: 6 mg/dL (ref 6–20)
CHLORIDE: 108 mmol/L (ref 98–111)
CO2: 25 mmol/L (ref 22–32)
CREATININE: 0.77 mg/dL (ref 0.61–1.24)
Calcium: 8.7 mg/dL — ABNORMAL LOW (ref 8.9–10.3)
GFR calc non Af Amer: >60 mL/min (ref >60)
Glucose, Bld: 106 mg/dL — ABNORMAL HIGH (ref 70–99)
POTASSIUM: 3.4 mmol/L — AB (ref 3.5–5.1)
SODIUM: 144 mmol/L (ref 135–145)
Total Bilirubin: 1.7 mg/dL — ABNORMAL HIGH (ref 0.3–1.2)
Total Protein: 6.8 g/dL (ref 6.5–8.1)

## 2017-10-30 LAB — CBC WITH DIFFERENTIAL/PLATELET
Basophils Absolute: 0 10*3/uL (ref 0.0–0.1)
Basophils Relative: 0 %
EOS PCT: 3 %
Eosinophils Absolute: 0.2 10*3/uL (ref 0.0–0.7)
HCT: 37.9 % — ABNORMAL LOW (ref 39.0–52.0)
Hemoglobin: 12.5 g/dL — ABNORMAL LOW (ref 13.0–17.0)
LYMPHS ABS: 1.1 10*3/uL (ref 0.7–4.0)
LYMPHS PCT: 17 %
MCH: 29.6 pg (ref 26.0–34.0)
MCHC: 33 g/dL (ref 30.0–36.0)
MCV: 89.6 fL (ref 78.0–100.0)
MONO ABS: 0.8 10*3/uL (ref 0.1–1.0)
Monocytes Relative: 14 %
Neutro Abs: 3.9 10*3/uL (ref 1.7–7.7)
Neutrophils Relative %: 66 %
PLATELETS: 274 10*3/uL (ref 150–400)
RBC: 4.23 MIL/uL (ref 4.22–5.81)
RDW: 17.5 % — AB (ref 11.5–15.5)
WBC: 6 10*3/uL (ref 4.0–10.5)

## 2017-10-30 LAB — PROTIME-INR
INR: 1.08
PROTHROMBIN TIME: 13.9 s (ref 11.4–15.2)

## 2017-11-01 NOTE — Anesthesia Preprocedure Evaluation (Addendum)
Anesthesia Evaluation  Patient identified by MRN, date of birth, ID band Patient awake    Reviewed: Allergy & Precautions, NPO status , Patient's Chart, lab work & pertinent test results  History of Anesthesia Complications Negative for: history of anesthetic complications  Airway Mallampati: II  TM Distance: >3 FB Neck ROM: Full    Dental  (+) Missing, Dental Advisory Given, Chipped   Pulmonary Current Smoker,    breath sounds clear to auscultation       Cardiovascular (-) anginanegative cardio ROS   Rhythm:Regular Rate:Normal     Neuro/Psych  Headaches,    GI/Hepatic (+)     substance abuse  marijuana use, Mets to liver Metastatic Rectal cancer: surgery, XRT, chemo   Endo/Other  negative endocrine ROS  Renal/GU negative Renal ROS     Musculoskeletal   Abdominal   Peds  Hematology negative hematology ROS (+)   Anesthesia Other Findings   Reproductive/Obstetrics                            Anesthesia Physical Anesthesia Plan  ASA: III  Anesthesia Plan: General   Post-op Pain Management:    Induction: Intravenous  PONV Risk Score and Plan: 2 and Ondansetron and Dexamethasone  Airway Management Planned: Oral ETT  Additional Equipment: Arterial line  Intra-op Plan:   Post-operative Plan: Extubation in OR  Informed Consent: I have reviewed the patients History and Physical, chart, labs and discussed the procedure including the risks, benefits and alternatives for the proposed anesthesia with the patient or authorized representative who has indicated his/her understanding and acceptance.   Dental advisory given  Plan Discussed with: CRNA and Surgeon  Anesthesia Plan Comments: (Plan routine monitors, A line for sampling, GETA )        Anesthesia Quick Evaluation

## 2017-11-02 ENCOUNTER — Inpatient Hospital Stay (HOSPITAL_COMMUNITY): Payer: Medicaid Other | Admitting: Anesthesiology

## 2017-11-02 ENCOUNTER — Other Ambulatory Visit: Payer: Self-pay

## 2017-11-02 ENCOUNTER — Encounter (HOSPITAL_COMMUNITY): Payer: Self-pay

## 2017-11-02 ENCOUNTER — Inpatient Hospital Stay (HOSPITAL_COMMUNITY)
Admission: RE | Admit: 2017-11-02 | Discharge: 2017-11-08 | DRG: 406 | Disposition: A | Discharge status: Home / Self Care | Payer: Medicaid Other | Source: Ambulatory Visit | Attending: General Surgery | Admitting: General Surgery

## 2017-11-02 ENCOUNTER — Encounter (HOSPITAL_COMMUNITY)
Admission: RE | Disposition: A | Discharge status: Home / Self Care | Payer: Self-pay | Source: Ambulatory Visit | Attending: General Surgery

## 2017-11-02 DIAGNOSIS — E441 Mild protein-calorie malnutrition: Secondary | ICD-10-CM | POA: Diagnosis present

## 2017-11-02 DIAGNOSIS — F1290 Cannabis use, unspecified, uncomplicated: Secondary | ICD-10-CM | POA: Diagnosis present

## 2017-11-02 DIAGNOSIS — D62 Acute posthemorrhagic anemia: Secondary | ICD-10-CM | POA: Diagnosis not present

## 2017-11-02 DIAGNOSIS — R197 Diarrhea, unspecified: Secondary | ICD-10-CM | POA: Diagnosis not present

## 2017-11-02 DIAGNOSIS — Z681 Body mass index (BMI) 19 or less, adult: Secondary | ICD-10-CM | POA: Diagnosis not present

## 2017-11-02 DIAGNOSIS — Z85048 Personal history of other malignant neoplasm of rectum, rectosigmoid junction, and anus: Secondary | ICD-10-CM

## 2017-11-02 DIAGNOSIS — C787 Secondary malignant neoplasm of liver and intrahepatic bile duct: Principal | ICD-10-CM | POA: Diagnosis present

## 2017-11-02 DIAGNOSIS — Z85038 Personal history of other malignant neoplasm of large intestine: Secondary | ICD-10-CM

## 2017-11-02 DIAGNOSIS — F1910 Other psychoactive substance abuse, uncomplicated: Secondary | ICD-10-CM | POA: Diagnosis present

## 2017-11-02 DIAGNOSIS — R739 Hyperglycemia, unspecified: Secondary | ICD-10-CM | POA: Diagnosis present

## 2017-11-02 DIAGNOSIS — E876 Hypokalemia: Secondary | ICD-10-CM | POA: Diagnosis present

## 2017-11-02 DIAGNOSIS — Z9049 Acquired absence of other specified parts of digestive tract: Secondary | ICD-10-CM

## 2017-11-02 DIAGNOSIS — K5900 Constipation, unspecified: Secondary | ICD-10-CM | POA: Diagnosis not present

## 2017-11-02 DIAGNOSIS — Z9221 Personal history of antineoplastic chemotherapy: Secondary | ICD-10-CM | POA: Diagnosis not present

## 2017-11-02 DIAGNOSIS — F17210 Nicotine dependence, cigarettes, uncomplicated: Secondary | ICD-10-CM | POA: Diagnosis present

## 2017-11-02 DIAGNOSIS — Z923 Personal history of irradiation: Secondary | ICD-10-CM | POA: Diagnosis not present

## 2017-11-02 DIAGNOSIS — C189 Malignant neoplasm of colon, unspecified: Secondary | ICD-10-CM | POA: Diagnosis present

## 2017-11-02 HISTORY — PX: LAPAROSCOPIC HEPATECTOMY: SHX5897

## 2017-11-02 LAB — CBC
HCT: 40.2 % (ref 39.0–52.0)
Hemoglobin: 13 g/dL (ref 13.0–17.0)
MCH: 29.5 pg (ref 26.0–34.0)
MCHC: 32.3 g/dL (ref 30.0–36.0)
MCV: 91.2 fL (ref 78.0–100.0)
Platelets: 242 10*3/uL (ref 150–400)
RBC: 4.41 MIL/uL (ref 4.22–5.81)
RDW: 17 % — ABNORMAL HIGH (ref 11.5–15.5)
WBC: 9.8 10*3/uL (ref 4.0–10.5)

## 2017-11-02 LAB — PREPARE RBC (CROSSMATCH)

## 2017-11-02 LAB — CREATININE, SERUM
Creatinine, Ser: 0.8 mg/dL (ref 0.61–1.24)
GFR calc Af Amer: >60 mL/min (ref >60)
GFR calc non Af Amer: >60 mL/min (ref >60)

## 2017-11-02 SURGERY — HEPATECTOMY, LAPAROSCOPIC
Anesthesia: General | Site: Abdomen

## 2017-11-02 MED ORDER — ROCURONIUM BROMIDE 10 MG/ML (PF) SYRINGE
PREFILLED_SYRINGE | INTRAVENOUS | Status: AC
  Filled 2017-11-02: qty 10

## 2017-11-02 MED ORDER — MIDAZOLAM HCL 2 MG/2ML IJ SOLN: 0.5000 mg | Freq: Once | INTRAMUSCULAR | Status: DC | PRN

## 2017-11-02 MED ORDER — CEFAZOLIN SODIUM-DEXTROSE 2-4 GM/100ML-% IV SOLN
2.0000 g | INTRAVENOUS | Status: AC
  Administered 2017-11-02: 2 g via INTRAVENOUS
  Filled 2017-11-02: qty 100

## 2017-11-02 MED ORDER — ACETAMINOPHEN 500 MG PO TABS
1000.0000 mg | ORAL_TABLET | ORAL | Status: AC
  Administered 2017-11-02: 1000 mg via ORAL
  Filled 2017-11-02: qty 2

## 2017-11-02 MED ORDER — METHOCARBAMOL 500 MG PO TABS
500.0000 mg | ORAL_TABLET | Freq: Three times a day (TID) | ORAL | Status: DC | PRN
  Administered 2017-11-03 – 2017-11-05 (×3): 500 mg via ORAL
  Filled 2017-11-02 (×3): qty 1

## 2017-11-02 MED ORDER — CELECOXIB 200 MG PO CAPS
200.0000 mg | ORAL_CAPSULE | Freq: Two times a day (BID) | ORAL | Status: DC
  Administered 2017-11-03 – 2017-11-05 (×5): 200 mg via ORAL
  Filled 2017-11-02 (×6): qty 1

## 2017-11-02 MED ORDER — SUGAMMADEX SODIUM 200 MG/2ML IV SOLN
INTRAVENOUS | Status: AC
  Filled 2017-11-02: qty 2

## 2017-11-02 MED ORDER — FAMOTIDINE IN NACL 20-0.9 MG/50ML-% IV SOLN
20.0000 mg | Freq: Two times a day (BID) | INTRAVENOUS | Status: DC
  Administered 2017-11-02 – 2017-11-05 (×7): 20 mg via INTRAVENOUS
  Filled 2017-11-02 (×7): qty 50

## 2017-11-02 MED ORDER — ONDANSETRON HCL 4 MG/2ML IJ SOLN
4.0000 mg | Freq: Four times a day (QID) | INTRAMUSCULAR | Status: DC | PRN
  Administered 2017-11-05: 4 mg via INTRAVENOUS
  Filled 2017-11-02: qty 2

## 2017-11-02 MED ORDER — HYDROMORPHONE HCL 1 MG/ML IJ SOLN
0.2500 mg | INTRAMUSCULAR | Status: DC | PRN
  Administered 2017-11-02 (×4): 0.5 mg via INTRAVENOUS

## 2017-11-02 MED ORDER — DIPHENHYDRAMINE HCL 12.5 MG/5ML PO ELIX: 12.5000 mg | ORAL_SOLUTION | Freq: Four times a day (QID) | ORAL | Status: DC | PRN

## 2017-11-02 MED ORDER — SUCCINYLCHOLINE CHLORIDE 200 MG/10ML IV SOSY
PREFILLED_SYRINGE | INTRAVENOUS | Status: AC
  Filled 2017-11-02: qty 10

## 2017-11-02 MED ORDER — PROPOFOL 10 MG/ML IV BOLUS
INTRAVENOUS | Status: AC
Start: 2017-11-02 — End: ?
  Filled 2017-11-02: qty 20

## 2017-11-02 MED ORDER — ACETAMINOPHEN 500 MG PO TABS
1000.0000 mg | ORAL_TABLET | Freq: Four times a day (QID) | ORAL | Status: DC
  Administered 2017-11-02 – 2017-11-08 (×21): 1000 mg via ORAL
  Filled 2017-11-02 (×23): qty 2

## 2017-11-02 MED ORDER — PROMETHAZINE HCL 25 MG/ML IJ SOLN
6.2500 mg | INTRAMUSCULAR | Status: DC | PRN
  Administered 2017-11-02: 12.5 mg via INTRAVENOUS

## 2017-11-02 MED ORDER — ROCURONIUM BROMIDE 10 MG/ML (PF) SYRINGE
PREFILLED_SYRINGE | INTRAVENOUS | Status: DC | PRN
  Administered 2017-11-02: 50 mg via INTRAVENOUS
  Administered 2017-11-02: 10 mg via INTRAVENOUS
  Administered 2017-11-02 (×2): 20 mg via INTRAVENOUS

## 2017-11-02 MED ORDER — DEXAMETHASONE SODIUM PHOSPHATE 10 MG/ML IJ SOLN
INTRAMUSCULAR | Status: DC | PRN
  Administered 2017-11-02: 10 mg via INTRAVENOUS

## 2017-11-02 MED ORDER — GABAPENTIN 300 MG PO CAPS
300.0000 mg | ORAL_CAPSULE | ORAL | Status: AC
  Administered 2017-11-02: 300 mg via ORAL
  Filled 2017-11-02: qty 1

## 2017-11-02 MED ORDER — BISACODYL 5 MG PO TBEC
5.0000 mg | DELAYED_RELEASE_TABLET | Freq: Every day | ORAL | Status: DC | PRN
  Administered 2017-11-03: 5 mg via ORAL
  Filled 2017-11-02: qty 1

## 2017-11-02 MED ORDER — GABAPENTIN 300 MG PO CAPS
300.0000 mg | ORAL_CAPSULE | Freq: Three times a day (TID) | ORAL | Status: DC
  Administered 2017-11-02 – 2017-11-08 (×19): 300 mg via ORAL
  Filled 2017-11-02 (×15): qty 1
  Filled 2017-11-02: qty 3
  Filled 2017-11-02 (×3): qty 1

## 2017-11-02 MED ORDER — LIDOCAINE 5 % EX PTCH
1.0000 | MEDICATED_PATCH | Freq: Every day | CUTANEOUS | Status: DC | PRN
  Filled 2017-11-02: qty 1

## 2017-11-02 MED ORDER — MIDAZOLAM HCL 5 MG/5ML IJ SOLN
INTRAMUSCULAR | Status: DC | PRN
  Administered 2017-11-02: 2 mg via INTRAVENOUS

## 2017-11-02 MED ORDER — BARRIER CREAM NON-SPECIFIED
1.0000 "application " | TOPICAL_CREAM | Freq: Two times a day (BID) | TOPICAL | Status: DC | PRN
  Filled 2017-11-02: qty 1

## 2017-11-02 MED ORDER — METOPROLOL TARTRATE 5 MG/5ML IV SOLN: 5.0000 mg | Freq: Four times a day (QID) | INTRAVENOUS | Status: DC | PRN

## 2017-11-02 MED ORDER — BUPIVACAINE-EPINEPHRINE (PF) 0.25% -1:200000 IJ SOLN
INTRAMUSCULAR | Status: AC
  Filled 2017-11-02: qty 30

## 2017-11-02 MED ORDER — MEPERIDINE HCL 50 MG/ML IJ SOLN: 6.2500 mg | INTRAMUSCULAR | Status: DC | PRN

## 2017-11-02 MED ORDER — CELECOXIB 200 MG PO CAPS
200.0000 mg | ORAL_CAPSULE | ORAL | Status: AC
  Administered 2017-11-02: 200 mg via ORAL
  Filled 2017-11-02: qty 1

## 2017-11-02 MED ORDER — STERILE WATER FOR IRRIGATION IR SOLN
Status: DC | PRN
  Administered 2017-11-02: 1000 mL

## 2017-11-02 MED ORDER — LIDOCAINE 2% (20 MG/ML) 5 ML SYRINGE
INTRAMUSCULAR | Status: AC
  Filled 2017-11-02: qty 5

## 2017-11-02 MED ORDER — KETOROLAC TROMETHAMINE 30 MG/ML IJ SOLN
30.0000 mg | Freq: Four times a day (QID) | INTRAMUSCULAR | Status: AC
  Administered 2017-11-02 – 2017-11-03 (×4): 30 mg via INTRAVENOUS
  Filled 2017-11-02 (×4): qty 1

## 2017-11-02 MED ORDER — TISSEEL VH 10 ML EX KIT
PACK | CUTANEOUS | Status: DC | PRN
  Administered 2017-11-02: 10 mL

## 2017-11-02 MED ORDER — MIDAZOLAM HCL 2 MG/2ML IJ SOLN
INTRAMUSCULAR | Status: AC
  Filled 2017-11-02: qty 2

## 2017-11-02 MED ORDER — KCL IN DEXTROSE-NACL 20-5-0.45 MEQ/L-%-% IV SOLN
INTRAVENOUS | Status: AC
  Filled 2017-11-02: qty 1000

## 2017-11-02 MED ORDER — DIPHENHYDRAMINE HCL 50 MG/ML IJ SOLN: 12.5000 mg | Freq: Four times a day (QID) | INTRAMUSCULAR | Status: DC | PRN

## 2017-11-02 MED ORDER — HYDROMORPHONE HCL 1 MG/ML IJ SOLN
INTRAMUSCULAR | Status: AC
  Filled 2017-11-02: qty 2

## 2017-11-02 MED ORDER — CHLORHEXIDINE GLUCONATE CLOTH 2 % EX PADS: 6.0000 | MEDICATED_PAD | Freq: Once | CUTANEOUS | Status: DC

## 2017-11-02 MED ORDER — TISSEEL VH 10 ML EX KIT
PACK | CUTANEOUS | Status: AC
  Filled 2017-11-02: qty 1

## 2017-11-02 MED ORDER — KCL IN DEXTROSE-NACL 20-5-0.45 MEQ/L-%-% IV SOLN
INTRAVENOUS | Status: DC
  Administered 2017-11-02: 14:00:00 via INTRAVENOUS
  Administered 2017-11-03: 1000 mL via INTRAVENOUS
  Administered 2017-11-03 – 2017-11-06 (×3): via INTRAVENOUS
  Filled 2017-11-02 (×5): qty 1000

## 2017-11-02 MED ORDER — LACTATED RINGERS IR SOLN
Status: DC | PRN
  Administered 2017-11-02: 1000 mL

## 2017-11-02 MED ORDER — PROPOFOL 10 MG/ML IV BOLUS
INTRAVENOUS | Status: DC | PRN
  Administered 2017-11-02: 150 mg via INTRAVENOUS

## 2017-11-02 MED ORDER — PHENYLEPHRINE 40 MCG/ML (10ML) SYRINGE FOR IV PUSH (FOR BLOOD PRESSURE SUPPORT)
PREFILLED_SYRINGE | INTRAVENOUS | Status: DC | PRN
  Administered 2017-11-02 (×4): 80 ug via INTRAVENOUS

## 2017-11-02 MED ORDER — ZOLPIDEM TARTRATE 5 MG PO TABS: 5.0000 mg | ORAL_TABLET | Freq: Every evening | ORAL | Status: DC | PRN

## 2017-11-02 MED ORDER — LACTATED RINGERS IV SOLN
INTRAVENOUS | Status: DC | PRN
  Administered 2017-11-02 (×2): via INTRAVENOUS

## 2017-11-02 MED ORDER — FENTANYL CITRATE (PF) 250 MCG/5ML IJ SOLN
INTRAMUSCULAR | Status: AC
Start: 2017-11-02 — End: ?
  Filled 2017-11-02: qty 5

## 2017-11-02 MED ORDER — LIDOCAINE 2% (20 MG/ML) 5 ML SYRINGE
INTRAMUSCULAR | Status: DC | PRN
  Administered 2017-11-02: 100 mg via INTRAVENOUS

## 2017-11-02 MED ORDER — 0.9 % SODIUM CHLORIDE (POUR BTL) OPTIME
TOPICAL | Status: DC | PRN
  Administered 2017-11-02: 1000 mL

## 2017-11-02 MED ORDER — OXYCODONE HCL 5 MG PO TABS
5.0000 mg | ORAL_TABLET | Freq: Three times a day (TID) | ORAL | Status: DC | PRN
  Administered 2017-11-02 – 2017-11-05 (×9): 10 mg via ORAL
  Filled 2017-11-02 (×11): qty 2

## 2017-11-02 MED ORDER — ONDANSETRON HCL 4 MG/2ML IJ SOLN
INTRAMUSCULAR | Status: AC
  Filled 2017-11-02: qty 2

## 2017-11-02 MED ORDER — PROMETHAZINE HCL 25 MG/ML IJ SOLN
INTRAMUSCULAR | Status: AC
  Filled 2017-11-02: qty 1

## 2017-11-02 MED ORDER — SIMETHICONE 80 MG PO CHEW: 40.0000 mg | CHEWABLE_TABLET | Freq: Four times a day (QID) | ORAL | Status: DC | PRN

## 2017-11-02 MED ORDER — ONDANSETRON 4 MG PO TBDP
4.0000 mg | ORAL_TABLET | Freq: Four times a day (QID) | ORAL | Status: DC | PRN
  Administered 2017-11-04: 4 mg via ORAL
  Filled 2017-11-02: qty 1

## 2017-11-02 MED ORDER — ENOXAPARIN SODIUM 40 MG/0.4ML ~~LOC~~ SOLN
40.0000 mg | SUBCUTANEOUS | Status: DC
  Administered 2017-11-03 – 2017-11-08 (×6): 40 mg via SUBCUTANEOUS
  Filled 2017-11-02 (×6): qty 0.4

## 2017-11-02 MED ORDER — CEFAZOLIN SODIUM-DEXTROSE 2-4 GM/100ML-% IV SOLN
2.0000 g | Freq: Three times a day (TID) | INTRAVENOUS | Status: AC
  Administered 2017-11-02: 2 g via INTRAVENOUS
  Filled 2017-11-02 (×2): qty 100

## 2017-11-02 MED ORDER — DEXAMETHASONE SODIUM PHOSPHATE 10 MG/ML IJ SOLN
INTRAMUSCULAR | Status: AC
  Filled 2017-11-02: qty 1

## 2017-11-02 MED ORDER — MORPHINE SULFATE (PF) 2 MG/ML IV SOLN
1.0000 mg | INTRAVENOUS | Status: DC | PRN
  Administered 2017-11-02 – 2017-11-03 (×6): 2 mg via INTRAVENOUS
  Filled 2017-11-02 (×6): qty 1

## 2017-11-02 MED ORDER — LACTATED RINGERS IV SOLN
INTRAVENOUS | Status: DC
  Administered 2017-11-02: 1000 mL via INTRAVENOUS

## 2017-11-02 MED ORDER — ONDANSETRON HCL 4 MG/2ML IJ SOLN
INTRAMUSCULAR | Status: DC | PRN
  Administered 2017-11-02: 4 mg via INTRAVENOUS

## 2017-11-02 MED ORDER — LIDOCAINE HCL (PF) 1 % IJ SOLN
INTRAMUSCULAR | Status: DC | PRN
  Administered 2017-11-02: 30 mL

## 2017-11-02 MED ORDER — SENNA 8.6 MG PO TABS
1.0000 | ORAL_TABLET | Freq: Two times a day (BID) | ORAL | Status: DC
Start: 2017-11-02 — End: 2017-11-08
  Administered 2017-11-02 – 2017-11-08 (×12): 8.6 mg via ORAL
  Filled 2017-11-02 (×12): qty 1

## 2017-11-02 MED ORDER — FENTANYL CITRATE (PF) 100 MCG/2ML IJ SOLN
INTRAMUSCULAR | Status: DC | PRN
  Administered 2017-11-02 (×5): 50 ug via INTRAVENOUS

## 2017-11-02 MED ORDER — BUPIVACAINE-EPINEPHRINE 0.25% -1:200000 IJ SOLN
INTRAMUSCULAR | Status: DC | PRN
  Administered 2017-11-02: 30 mL

## 2017-11-02 MED ORDER — LIDOCAINE HCL (PF) 1 % IJ SOLN
INTRAMUSCULAR | Status: AC
  Filled 2017-11-02: qty 30

## 2017-11-02 SURGICAL SUPPLY — 109 items
APPLICATOR SURGIFLO ENDO (HEMOSTASIS) ×2 IMPLANT
BAG BILE T-TUBES STRL (MISCELLANEOUS) ×2 IMPLANT
BLADE EXTENDED COATED 6.5IN (ELECTRODE) ×2 IMPLANT
BLADE HEX COATED 2.75 (ELECTRODE) ×2 IMPLANT
BLADE SURG SZ10 CARB STEEL (BLADE) IMPLANT
BOOT SUTURE VASCULAR YLW (MISCELLANEOUS)
CATH KIT ON-Q SILVERSOAK 7.5IN (CATHETERS) IMPLANT
CHLORAPREP W/TINT 26ML (MISCELLANEOUS) ×2 IMPLANT
CLAMP SUTURE YELLOW 5 PAIRS (MISCELLANEOUS) IMPLANT
CLIP VESOCCLUDE LG 6/CT (CLIP) ×2 IMPLANT
CLIP VESOCCLUDE MED 6/CT (CLIP) ×2 IMPLANT
CLIP VESOLOCK LG 6/CT PURPLE (CLIP) ×2 IMPLANT
CLIP VESOLOCK MED 6/CT (CLIP) IMPLANT
CLIP VESOLOCK MED LG 6/CT (CLIP) ×2 IMPLANT
DECANTER SPIKE VIAL GLASS SM (MISCELLANEOUS) ×2 IMPLANT
DERMABOND ADVANCED (GAUZE/BANDAGES/DRESSINGS) ×1
DERMABOND ADVANCED .7 DNX12 (GAUZE/BANDAGES/DRESSINGS) ×1 IMPLANT
DISSECTOR ROUND CHERRY 3/8 STR (MISCELLANEOUS) IMPLANT
DRAIN CHANNEL 19F RND (DRAIN) ×2 IMPLANT
DRAPE C-ARM 42X120 X-RAY (DRAPES) IMPLANT
DRAPE SHEET LG 3/4 BI-LAMINATE (DRAPES) IMPLANT
DRAPE UTILITY XL STRL (DRAPES) ×4 IMPLANT
DRAPE WARM FLUID 44X44 (DRAPE) ×2 IMPLANT
DRESSING TELFA ISLAND 4X8 (GAUZE/BANDAGES/DRESSINGS) IMPLANT
DRSG OPSITE POSTOP 4X6 (GAUZE/BANDAGES/DRESSINGS) ×2 IMPLANT
DRSG PAD ABDOMINAL 8X10 ST (GAUZE/BANDAGES/DRESSINGS) IMPLANT
DRSG TELFA 4X10 ISLAND STR (GAUZE/BANDAGES/DRESSINGS) IMPLANT
DRSG TELFA PLUS 4X6 ADH ISLAND (GAUZE/BANDAGES/DRESSINGS) IMPLANT
EVACUATOR SILICONE 100CC (DRAIN) IMPLANT
GAUZE 4X4 16PLY RFD (DISPOSABLE) ×2 IMPLANT
GAUZE SPONGE 4X4 12PLY STRL (GAUZE/BANDAGES/DRESSINGS) IMPLANT
GLOVE BIO SURGEON STRL SZ 6 (GLOVE) ×2 IMPLANT
GLOVE INDICATOR 6.5 STRL GRN (GLOVE) ×4 IMPLANT
GOWN STRL REUS W/ TWL XL LVL3 (GOWN DISPOSABLE) ×3 IMPLANT
GOWN STRL REUS W/TWL 2XL LVL3 (GOWN DISPOSABLE) ×2 IMPLANT
GOWN STRL REUS W/TWL LRG LVL3 (GOWN DISPOSABLE) IMPLANT
GOWN STRL REUS W/TWL XL LVL3 (GOWN DISPOSABLE) ×3
HANDLE SUCTION POOLE (INSTRUMENTS) ×1 IMPLANT
HEMOSTAT SURGICEL 4X8 (HEMOSTASIS) IMPLANT
KIT BASIN OR (CUSTOM PROCEDURE TRAY) ×2 IMPLANT
L-HOOK LAP DISP 36CM (ELECTROSURGICAL) ×2
LHOOK LAP DISP 36CM (ELECTROSURGICAL) ×1 IMPLANT
LOOP VESSEL MAXI BLUE (MISCELLANEOUS) ×2 IMPLANT
LOOP VESSEL MINI RED (MISCELLANEOUS) IMPLANT
NEEDLE BIOPSY 14GX4.5 SOFT TIS (NEEDLE) IMPLANT
NEEDLE HYPO 22GX1.5 SAFETY (NEEDLE) ×2 IMPLANT
NS IRRIG 1000ML POUR BTL (IV SOLUTION) ×4 IMPLANT
PACK GENERAL/GYN (CUSTOM PROCEDURE TRAY) ×2 IMPLANT
PACK UNIVERSAL I (CUSTOM PROCEDURE TRAY) ×2 IMPLANT
PAD POSITIONING PINK XL (MISCELLANEOUS) IMPLANT
POSITIONER SURGICAL ARM (MISCELLANEOUS) IMPLANT
RELOAD STAPLER WHITE 60MM (STAPLE) ×10 IMPLANT
SCISSORS HARMONIC WAVE 18CM (INSTRUMENTS) IMPLANT
SET IRRIG TUBING LAPAROSCOPIC (IRRIGATION / IRRIGATOR) ×2 IMPLANT
SHEARS FOC LG CVD HARMONIC 17C (MISCELLANEOUS) IMPLANT
SHEARS HARMONIC ACE PLUS 36CM (ENDOMECHANICALS) ×2 IMPLANT
SLEEVE SURGEON STRL (DRAPES) ×6 IMPLANT
SLEEVE XCEL OPT CAN 5 100 (ENDOMECHANICALS) ×4 IMPLANT
SPONGE DRAIN TRACH 4X4 STRL 2S (GAUZE/BANDAGES/DRESSINGS) IMPLANT
SPONGE LAP 18X18 RF (DISPOSABLE) ×4 IMPLANT
SPONGE SURGIFOAM ABS GEL 100 (HEMOSTASIS) IMPLANT
STAPLE ECHEON FLEX 60 POW ENDO (STAPLE) ×2 IMPLANT
STAPLER RELOAD WHITE 60MM (STAPLE) ×20
STAPLER VISISTAT 35W (STAPLE) IMPLANT
SUCTION POOLE HANDLE (INSTRUMENTS) ×2
SUT CHROMIC 3 0 SH 27 (SUTURE) IMPLANT
SUT CHROMIC 4 0 RB 1X27 (SUTURE) IMPLANT
SUT ETHILON 2 0 PS N (SUTURE) ×2 IMPLANT
SUT MNCRL AB 4-0 PS2 18 (SUTURE) IMPLANT
SUT PDS AB 1 TP1 54 (SUTURE) IMPLANT
SUT PDS AB 1 TP1 96 (SUTURE) ×4 IMPLANT
SUT PROLENE 3 0 SH 48 (SUTURE) IMPLANT
SUT PROLENE 3 0 SH1 36 (SUTURE) ×2 IMPLANT
SUT PROLENE 4 0 RB 1 (SUTURE) ×1
SUT PROLENE 4-0 RB1 .5 CRCL 36 (SUTURE) ×1 IMPLANT
SUT PROLENE 5 0 CC 1 (SUTURE) IMPLANT
SUT SILK 0 FSL (SUTURE) ×4 IMPLANT
SUT SILK 2 0 (SUTURE) ×1
SUT SILK 2 0 SH CR/8 (SUTURE) ×2 IMPLANT
SUT SILK 2-0 18XBRD TIE 12 (SUTURE) ×1 IMPLANT
SUT SILK 3 0 (SUTURE)
SUT SILK 3 0 SH CR/8 (SUTURE) IMPLANT
SUT SILK 3-0 18XBRD TIE 12 (SUTURE) IMPLANT
SUT VIC AB 2-0 CTX 36 (SUTURE) ×2 IMPLANT
SUT VIC AB 3-0 SH 18 (SUTURE) IMPLANT
SUT VIC AB 4-0 SH 18 (SUTURE) IMPLANT
SUT VICRYL 2 0 18  UND BR (SUTURE) ×1
SUT VICRYL 2 0 18 UND BR (SUTURE) ×1 IMPLANT
SUT VICRYL 3 0 BR 18  UND (SUTURE) ×1
SUT VICRYL 3 0 BR 18 UND (SUTURE) ×1 IMPLANT
SYR 10ML LL (SYRINGE) IMPLANT
SYR CONTROL 10ML LL (SYRINGE) ×2 IMPLANT
SYS LAPSCP GELPORT 120MM (MISCELLANEOUS) ×2
SYSTEM LAPSCP GELPORT 120MM (MISCELLANEOUS) ×1 IMPLANT
TAG SUTURE CLAMP YLW 5PR (MISCELLANEOUS)
TAPE CLOTH 4X10 WHT NS (GAUZE/BANDAGES/DRESSINGS) IMPLANT
TAPE UMBILICAL COTTON 1/8X30 (MISCELLANEOUS) IMPLANT
TOWEL BLUE STERILE X RAY DET (MISCELLANEOUS) IMPLANT
TOWEL OR 17X26 10 PK STRL BLUE (TOWEL DISPOSABLE) ×2 IMPLANT
TRAY FOLEY MTR SLVR 16FR STAT (SET/KITS/TRAYS/PACK) ×2 IMPLANT
TROCAR BLADELESS OPT 5 100 (ENDOMECHANICALS) ×2 IMPLANT
TROCAR UNIVERSAL OPT 12M 100M (ENDOMECHANICALS) ×2 IMPLANT
TROCAR XCEL 12X100 BLDLESS (ENDOMECHANICALS) ×2 IMPLANT
TROCAR XCEL BLUNT TIP 100MML (ENDOMECHANICALS) IMPLANT
TROCAR XCEL NON-BLD 11X100MML (ENDOMECHANICALS) ×2 IMPLANT
TUBING INSUF HEATED (TUBING) IMPLANT
TUNNELER SHEATH ON-Q 16GX12 DP (PAIN MANAGEMENT) IMPLANT
VESSEL LOOPS MINI RED (MISCELLANEOUS)
YANKAUER SUCT BULB TIP NO VENT (SUCTIONS) ×2 IMPLANT

## 2017-11-02 NOTE — Anesthesia Postprocedure Evaluation (Signed)
Anesthesia Post Note  Patient: Jacob Galvan.  Procedure(s) Performed: LAPAROSCOPIC ASSISTED PARTIAL HEPATECTOMY (N/A Abdomen)     Patient location during evaluation: PACU Anesthesia Type: General Level of consciousness: awake and alert, oriented and patient cooperative Pain management: pain level controlled Vital Signs Assessment: post-procedure vital signs reviewed and stable Respiratory status: spontaneous breathing, nonlabored ventilation, respiratory function stable and patient connected to nasal cannula oxygen Cardiovascular status: blood pressure returned to baseline and stable Postop Assessment: no apparent nausea or vomiting Anesthetic complications: no    Last Vitals:  Vitals:   11/02/17 1200 11/02/17 1230  BP: 117/79 127/84  Pulse: 72 75  Resp: 15 12  Temp: 36.4 C 36.8 C  SpO2: 100% 100%    Last Pain:  Vitals:   11/02/17 1204  TempSrc:   PainSc: Asleep                 Jerelle Virden,E. Coy Rochford

## 2017-11-02 NOTE — Transfer of Care (Signed)
Immediate Anesthesia Transfer of Care Note  Patient: Jacob Galvan.  Procedure(s) Performed: LAPAROSCOPIC ASSISTED PARTIAL HEPATECTOMY (N/A Abdomen)  Patient Location: PACU  Anesthesia Type:General  Level of Consciousness: sedated  Airway & Oxygen Therapy: Patient Spontanous Breathing and Patient connected to face mask oxygen  Post-op Assessment: Report given to RN and Post -op Vital signs reviewed and stable  Post vital signs: Reviewed and stable  Last Vitals:  Vitals Value Taken Time  BP 125/82 11/02/2017 11:05 AM  Temp    Pulse 78 11/02/2017 11:08 AM  Resp 20 11/02/2017 11:08 AM  SpO2 100 % 11/02/2017 11:08 AM  Vitals shown include unvalidated device data.  Last Pain:  Vitals:   11/02/17 0607  TempSrc:   PainSc: 4       Patients Stated Pain Goal: 4 (03/54/65 6812)  Complications: No apparent anesthesia complications

## 2017-11-02 NOTE — Op Note (Signed)
PRE-OPERATIVE DIAGNOSIS: colon cancer metastatic to the liver  POST-OPERATIVE DIAGNOSIS:  Same  PROCEDURE:  Procedure(s): Hand assisted partial hepatectomy (left lateral segmentectomy), intraoperative liver ultrasound   SURGEON:  Surgeon(s): Stark Klein, MD  ASSISTANT: Armandina Gemma, MD  ANESTHESIA:   local and general  DRAINS: (19 Fr) Blake drain(s) in the RUQ along the cut surface of the liver   LOCAL MEDICATIONS USED:  MARCAINE    and LIDOCAINE   SPECIMEN:  Source of Specimen:  left lateral segment  DISPOSITION OF SPECIMEN:  PATHOLOGY  COUNTS:  YES  DICTATION: .Dragon Dictation  PLAN OF CARE: Admit to inpatient   PATIENT DISPOSITION:  PACU - hemodynamically stable.  FINDINGS:  Firm mass left lateral segment  EBL: 200  PROCEDURE:  Patient was identified in the holding area and taken to the operating room where he was placed supine on the operating room table.  General endotracheal anesthesia was induced.  A Foley catheter was placed in the patient's arms were tucked, taking care to appropriately pad.  The patient's abdomen was then prepped and draped in sterile fashion.  A timeout was performed according to the surgical safety checklist.  When all was correct, we continued.  The patient was placed into reverse Trendelenburg position and rotated to the right.  The previous left Optiview trocar site was anesthetized with local anesthetic and a 5 mm incision was made with 11 blade.  The Optiview trocar was used to access the abdomen under direct visualization.  Pneumoperitoneum was achieved to pressure 15 mmHg.  The previous biopsy site was seen to be adherent to the abdominal wall.  An additional 5 mm trocar was placed more laterally on the left.  A small 5 mm trocar was placed in the midline and one in the right upper quadrant.  The adhesions were taken down from the superior surface of the liver.    The ultrasound probe was required to identify the exact location of the  tumor.  The left subcostal port was upsized to a 12 mm port.  The ultrasound was used to confirm the location of the mass as it was not visible externally.  Picture will be included below.  The liver was difficult to mobilize based on the patient's anatomy.  The midline port was upsized to a hand port and additional port was placed in the right side of the abdomen.  The triangular ligament on the left was then taken down with cautery and with the harmonic scalpel.  Very carefully the adhesions near the hepatic veins were taken down.  The mass was palpable.  The surface of the liver was scored with cautery.  The harmonic scalpel was used to divide the superior superficial aspect of the liver.  The Echelon stapler was then used to fire across the deeper surfaces of the liver taking care to divide the hepatic vein as well as the portal vein.  Multiple loads were required.  Care was taken not to leave and undrained liver.  The cut surface the liver and was then assessed for bleeding.  The capsule in the subcapsular regions were cauterized.  Pressure was held on the surface of the liver with a clean lap pad for 5 minutes.  There was no evidence of biliary leakage on the laparotomy sponge.  The surface of the liver was not seen to be bleeding.  The abdomen was irrigated and the irrigant with blood was suctioned from the abdomen.  Tisseel was placed over the cut surface of the  liver.  A 19 French Blake drain was placed over the cut surface of the lateralmost right-sided port.  The drain was secured with a 2-0 nylon.  The ports were removed.  The skin of the port sites was closed using 4-0 Monocryl.  The midline port was closed using running looped #1 PDS suture.  The remainder of the local anesthetic was infiltrated along the preperitoneum and fascial layer.  The skin of the hand port was also closed with 4-0 Monocryl.  The wounds were cleaned, dried, and dressed with Dermabond.  The midline also had a honeycomb  dressing placed over it.  The drain was dressed sterilely.  The patient tolerated the procedure well.  He was allowed to emerge from anesthesia and taken to PACU in stable condition.  Needle, sponge, and instrument counts were correct x2.

## 2017-11-02 NOTE — Anesthesia Procedure Notes (Signed)
Arterial Line Insertion Start/End7/18/2019 8:21 AM, 11/02/2017 8:25 AM Performed by: Annye Asa, MD, anesthesiologist  Patient location: OR. Preanesthetic checklist: patient identified, IV checked, risks and benefits discussed, surgical consent, monitors and equipment checked, pre-op evaluation, timeout performed and anesthesia consent Left, radial was placed Catheter size: 20 G Hand hygiene performed  and Seldinger technique used Allen's test indicative of satisfactory collateral circulation Attempts: 1 Procedure performed without using ultrasound guided technique. Following insertion, dressing applied and Biopatch. Patient tolerated the procedure well with no immediate complications.

## 2017-11-02 NOTE — Interval H&P Note (Signed)
History and Physical Interval Note:  11/02/2017 7:57 AM  Jacob Galvan.  has presented today for surgery, with the diagnosis of CANCER METASTATIC TO LIVER  The various methods of treatment have been discussed with the patient and family. After consideration of risks, benefits and other options for treatment, the patient has consented to  Procedure(s): LAPAROSCOPIC ASSISTED PARTIAL HEPATECTOMY (N/A) as a surgical intervention .  The patient's history has been reviewed, patient examined, no change in status, stable for surgery.  I have reviewed the patient's chart and labs.  Questions were answered to the patient's satisfaction.     Stark Klein

## 2017-11-02 NOTE — Anesthesia Procedure Notes (Signed)
Procedure Name: Intubation Date/Time: 11/02/2017 8:20 AM Performed by: Lind Covert, CRNA Pre-anesthesia Checklist: Patient identified, Emergency Drugs available, Suction available, Patient being monitored and Timeout performed Patient Re-evaluated:Patient Re-evaluated prior to induction Oxygen Delivery Method: Circle system utilized Preoxygenation: Pre-oxygenation with 100% oxygen Induction Type: IV induction Ventilation: Mask ventilation without difficulty Laryngoscope Size: Mac and 4 Grade View: Grade I Tube type: Oral Tube size: 7.5 mm Number of attempts: 1 Airway Equipment and Method: Stylet Placement Confirmation: ETT inserted through vocal cords under direct vision,  positive ETCO2 and breath sounds checked- equal and bilateral Secured at: 23 cm Tube secured with: Tape Dental Injury: Teeth and Oropharynx as per pre-operative assessment

## 2017-11-03 ENCOUNTER — Encounter (HOSPITAL_COMMUNITY): Payer: Self-pay | Admitting: *Deleted

## 2017-11-03 LAB — PROTIME-INR
INR: 1.22
PROTHROMBIN TIME: 15.3 s — AB (ref 11.4–15.2)

## 2017-11-03 LAB — COMPREHENSIVE METABOLIC PANEL
ALBUMIN: 3.2 g/dL — AB (ref 3.5–5.0)
ALT: 131 U/L — ABNORMAL HIGH (ref 0–44)
ANION GAP: 8 (ref 5–15)
AST: 107 U/L — ABNORMAL HIGH (ref 15–41)
Alkaline Phosphatase: 43 U/L (ref 38–126)
BUN: 5 mg/dL — ABNORMAL LOW (ref 6–20)
CALCIUM: 8.4 mg/dL — AB (ref 8.9–10.3)
CO2: 28 mmol/L (ref 22–32)
Chloride: 106 mmol/L (ref 98–111)
Creatinine, Ser: 0.74 mg/dL (ref 0.61–1.24)
GLUCOSE: 156 mg/dL — AB (ref 70–99)
POTASSIUM: 3.2 mmol/L — AB (ref 3.5–5.1)
Sodium: 142 mmol/L (ref 135–145)
TOTAL PROTEIN: 5.8 g/dL — AB (ref 6.5–8.1)
Total Bilirubin: 1.7 mg/dL — ABNORMAL HIGH (ref 0.3–1.2)

## 2017-11-03 LAB — CBC
HEMATOCRIT: 36.2 % — AB (ref 39.0–52.0)
HEMOGLOBIN: 11.7 g/dL — AB (ref 13.0–17.0)
MCH: 29.3 pg (ref 26.0–34.0)
MCHC: 32.3 g/dL (ref 30.0–36.0)
MCV: 90.7 fL (ref 78.0–100.0)
Platelets: 215 10*3/uL (ref 150–400)
RBC: 3.99 MIL/uL — ABNORMAL LOW (ref 4.22–5.81)
RDW: 16.7 % — AB (ref 11.5–15.5)
WBC: 10 10*3/uL (ref 4.0–10.5)

## 2017-11-03 LAB — MAGNESIUM: MAGNESIUM: 1.4 mg/dL — AB (ref 1.7–2.4)

## 2017-11-03 LAB — PHOSPHORUS: PHOSPHORUS: 3.1 mg/dL (ref 2.5–4.6)

## 2017-11-03 MED ORDER — LIP MEDEX EX OINT: TOPICAL_OINTMENT | CUTANEOUS | Status: DC | PRN

## 2017-11-03 MED ORDER — PSYLLIUM 95 % PO PACK
1.0000 | PACK | Freq: Two times a day (BID) | ORAL | Status: DC
  Administered 2017-11-03 – 2017-11-06 (×3): 1 via ORAL
  Filled 2017-11-03 (×6): qty 1

## 2017-11-03 MED ORDER — MAGNESIUM SULFATE 2 GM/50ML IV SOLN
2.0000 g | Freq: Once | INTRAVENOUS | Status: AC
  Administered 2017-11-03: 2 g via INTRAVENOUS
  Filled 2017-11-03: qty 50

## 2017-11-03 MED ORDER — SODIUM CHLORIDE 0.9 % IV SOLN
Freq: Every day | INTRAVENOUS | Status: DC | PRN
  Administered 2017-11-03: 13:00:00 via INTRAVENOUS

## 2017-11-03 MED ORDER — POTASSIUM CHLORIDE 10 MEQ/100ML IV SOLN
10.0000 meq | INTRAVENOUS | Status: AC
Start: 2017-11-03 — End: 2017-11-03
  Administered 2017-11-03 (×4): 10 meq via INTRAVENOUS
  Filled 2017-11-03: qty 100

## 2017-11-03 MED ORDER — LIP MEDEX EX OINT
TOPICAL_OINTMENT | CUTANEOUS | Status: AC
  Administered 2017-11-03: 1
  Filled 2017-11-03: qty 7

## 2017-11-03 MED ORDER — HYDROMORPHONE HCL 1 MG/ML IJ SOLN
0.5000 mg | INTRAMUSCULAR | Status: DC | PRN
  Administered 2017-11-03 (×3): 1 mg via INTRAVENOUS
  Administered 2017-11-04 (×5): 2 mg via INTRAVENOUS
  Administered 2017-11-05: 1 mg via INTRAVENOUS
  Administered 2017-11-05: 2 mg via INTRAVENOUS
  Administered 2017-11-05 – 2017-11-06 (×3): 1 mg via INTRAVENOUS
  Administered 2017-11-06: 2 mg via INTRAVENOUS
  Administered 2017-11-06: 1 mg via INTRAVENOUS
  Administered 2017-11-06 (×3): 2 mg via INTRAVENOUS
  Administered 2017-11-07: 1 mg via INTRAVENOUS
  Administered 2017-11-07 (×2): 2 mg via INTRAVENOUS
  Administered 2017-11-08 (×2): 1 mg via INTRAVENOUS
  Filled 2017-11-03 (×3): qty 1
  Filled 2017-11-03: qty 2
  Filled 2017-11-03: qty 1
  Filled 2017-11-03: qty 2
  Filled 2017-11-03 (×4): qty 1
  Filled 2017-11-03 (×2): qty 2
  Filled 2017-11-03: qty 1
  Filled 2017-11-03: qty 2
  Filled 2017-11-03 (×2): qty 1
  Filled 2017-11-03 (×8): qty 2

## 2017-11-03 MED ORDER — POTASSIUM CHLORIDE CRYS ER 20 MEQ PO TBCR
40.0000 meq | EXTENDED_RELEASE_TABLET | Freq: Every day | ORAL | Status: DC
  Administered 2017-11-03 – 2017-11-08 (×6): 40 meq via ORAL
  Filled 2017-11-03 (×6): qty 2

## 2017-11-03 NOTE — Addendum Note (Signed)
Addendum  created 11/03/17 0645 by Lollie Sails, CRNA   Charge Capture section accepted

## 2017-11-03 NOTE — Progress Notes (Signed)
1 Day Post-Op   Subjective/Chief Complaint: Sore, but no n/v.  + flatus.     Objective: Vital signs in last 24 hours: Temp:  [97.6 F (36.4 C)-99.6 F (37.6 C)] 98.3 F (36.8 C) (07/19 0636) Pulse Rate:  [68-92] 68 (07/19 0636) Resp:  [12-23] 18 (07/19 0636) BP: (108-130)/(77-93) 108/77 (07/19 0636) SpO2:  [100 %] 100 % (07/19 0636) Arterial Line BP: (124-130)/(68-72) 130/72 (07/18 1145) Last BM Date: 10/31/17  Intake/Output from previous day: 07/18 0701 - 07/19 0700 In: 6846.7 [P.O.:2220; I.V.:4326.7; IV Piggyback:300] Out: 0960 [AVWUJ:8119; Drains:170; Blood:100] Intake/Output this shift: No intake/output data recorded.  General appearance: alert, cooperative and no distress Resp: breathing comfortably GI: soft, non distended, dressings c/d/i.  drain serosang Extremities: extremities normal, atraumatic, no cyanosis or edema  Lab Results:  Recent Labs    11/02/17 1316 11/03/17 0506  WBC 9.8 10.0  HGB 13.0 11.7*  HCT 40.2 36.2*  PLT 242 215   BMET Recent Labs    11/02/17 1316 11/03/17 0506  NA  --  142  K  --  3.2*  CL  --  106  CO2  --  28  GLUCOSE  --  156*  BUN  --  5*  CREATININE 0.80 0.74  CALCIUM  --  8.4*   PT/INR Recent Labs    11/03/17 0506  LABPROT 15.3*  INR 1.22   ABG No results for input(s): PHART, HCO3 in the last 72 hours.  Invalid input(s): PCO2, PO2  Studies/Results: No results found.  Anti-infectives: Anti-infectives (From admission, onward)   Start     Dose/Rate Route Frequency Ordered Stop   11/02/17 1630  ceFAZolin (ANCEF) IVPB 2g/100 mL premix     2 g 200 mL/hr over 30 Minutes Intravenous Every 8 hours 11/02/17 1244 11/02/17 1830   11/02/17 0600  ceFAZolin (ANCEF) IVPB 2g/100 mL premix     2 g 200 mL/hr over 30 Minutes Intravenous On call to O.R. 11/02/17 0543 11/02/17 0830      Assessment/Plan: s/p Procedure(s): LAPAROSCOPIC ASSISTED PARTIAL HEPATECTOMY (N/A)  Colon cancer metastatic to liver Mild protein  calorie malnutrition Acute blood loss anemia d/c foley replete K for hypokalemia  Replete mag for hypomagnesemia Stress related hyperglycemia Advance diet.   LOS: 1 day    Stark Klein 11/03/2017

## 2017-11-03 NOTE — Progress Notes (Signed)
[facility] education reinforced. Did patient attend class prior to procedure? Yes [   ] No [ x  ] Discussed: Pain Control [x   ] Mobility [x   ] Diet [   ] Other [   ]  Patient states has been walking in the hallway and sitting in the chair. Questions answered. Pecolia Ades, RN, BSN Quality Program Coordinator, [facility] Recovery after Surgery 11/03/17 11:00 AM

## 2017-11-04 LAB — COMPREHENSIVE METABOLIC PANEL
ALT: 84 U/L — ABNORMAL HIGH (ref 0–44)
AST: 46 U/L — AB (ref 15–41)
Albumin: 3.1 g/dL — ABNORMAL LOW (ref 3.5–5.0)
Alkaline Phosphatase: 44 U/L (ref 38–126)
Anion gap: 5 (ref 5–15)
CALCIUM: 8.2 mg/dL — AB (ref 8.9–10.3)
CHLORIDE: 107 mmol/L (ref 98–111)
CO2: 30 mmol/L (ref 22–32)
Creatinine, Ser: 0.58 mg/dL — ABNORMAL LOW (ref 0.61–1.24)
GFR calc Af Amer: >60 mL/min (ref >60)
GFR calc non Af Amer: >60 mL/min (ref >60)
Glucose, Bld: 129 mg/dL — ABNORMAL HIGH (ref 70–99)
POTASSIUM: 3.5 mmol/L (ref 3.5–5.1)
SODIUM: 142 mmol/L (ref 135–145)
Total Bilirubin: 0.8 mg/dL (ref 0.3–1.2)
Total Protein: 5.8 g/dL — ABNORMAL LOW (ref 6.5–8.1)

## 2017-11-04 LAB — CBC
HCT: 34.6 % — ABNORMAL LOW (ref 39.0–52.0)
Hemoglobin: 11.1 g/dL — ABNORMAL LOW (ref 13.0–17.0)
MCH: 29.2 pg (ref 26.0–34.0)
MCHC: 32.1 g/dL (ref 30.0–36.0)
MCV: 91.1 fL (ref 78.0–100.0)
PLATELETS: 208 10*3/uL (ref 150–400)
RBC: 3.8 MIL/uL — ABNORMAL LOW (ref 4.22–5.81)
RDW: 16.8 % — AB (ref 11.5–15.5)
WBC: 6.2 10*3/uL (ref 4.0–10.5)

## 2017-11-04 NOTE — Progress Notes (Signed)
2 Days Post-Op   Subjective/Chief Complaint: Complains of pain. Tolerating diet   Objective: Vital signs in last 24 hours: Temp:  [97.9 F (36.6 C)-98.7 F (37.1 C)] 97.9 F (36.6 C) (07/20 0512) Pulse Rate:  [61-69] 61 (07/20 0512) Resp:  [15-18] 18 (07/20 0512) BP: (107-110)/(69-75) 109/75 (07/20 0512) SpO2:  [100 %] 100 % (07/20 0512) Last BM Date: 10/31/17  Intake/Output from previous day: 07/19 0701 - 07/20 0700 In: 2515.8 [P.O.:1200; I.V.:1315.8] Out: 1380 [Urine:1300; Drains:80] Intake/Output this shift: Total I/O In: 240 [P.O.:240] Out: 480 [Urine:400; Drains:80]  General appearance: alert and cooperative Resp: clear to auscultation bilaterally Cardio: regular rate and rhythm GI: soft. appropriately tender. drain output small blood tinged  Lab Results:  Recent Labs    11/03/17 0506 11/04/17 0439  WBC 10.0 6.2  HGB 11.7* 11.1*  HCT 36.2* 34.6*  PLT 215 208   BMET Recent Labs    11/03/17 0506 11/04/17 0439  NA 142 142  K 3.2* 3.5  CL 106 107  CO2 28 30  GLUCOSE 156* 129*  BUN 5* <5*  CREATININE 0.74 0.58*  CALCIUM 8.4* 8.2*   PT/INR Recent Labs    11/03/17 0506  LABPROT 15.3*  INR 1.22   ABG No results for input(Galvan): PHART, HCO3 in the last 72 hours.  Invalid input(Galvan): PCO2, PO2  Studies/Results: No results found.  Anti-infectives: Anti-infectives (From admission, onward)   Start     Dose/Rate Route Frequency Ordered Stop   11/02/17 1630  ceFAZolin (ANCEF) IVPB 2g/100 mL premix     2 g 200 mL/hr over 30 Minutes Intravenous Every 8 hours 11/02/17 1244 11/02/17 1830   11/02/17 0600  ceFAZolin (ANCEF) IVPB 2g/100 mL premix     2 g 200 mL/hr over 30 Minutes Intravenous On call to O.R. 11/02/17 0543 11/02/17 0830      Assessment/Plan: Galvan/p Procedure(Galvan): LAPAROSCOPIC ASSISTED PARTIAL HEPATECTOMY (N/A) Advance diet  Hg stable. Continue to work on pain control ambulate  LOS: 2 days    TOTH III,Jacob Galvan 11/04/2017

## 2017-11-05 LAB — CBC
HCT: 34.6 % — ABNORMAL LOW (ref 39.0–52.0)
Hemoglobin: 11.3 g/dL — ABNORMAL LOW (ref 13.0–17.0)
MCH: 29.6 pg (ref 26.0–34.0)
MCHC: 32.7 g/dL (ref 30.0–36.0)
MCV: 90.6 fL (ref 78.0–100.0)
Platelets: 200 10*3/uL (ref 150–400)
RBC: 3.82 MIL/uL — AB (ref 4.22–5.81)
RDW: 16.4 % — ABNORMAL HIGH (ref 11.5–15.5)
WBC: 4.9 10*3/uL (ref 4.0–10.5)

## 2017-11-05 LAB — COMPREHENSIVE METABOLIC PANEL
ALBUMIN: 3.1 g/dL — AB (ref 3.5–5.0)
ALK PHOS: 48 U/L (ref 38–126)
ALT: 62 U/L — ABNORMAL HIGH (ref 0–44)
AST: 28 U/L (ref 15–41)
Anion gap: 8 (ref 5–15)
BILIRUBIN TOTAL: 0.9 mg/dL (ref 0.3–1.2)
CALCIUM: 8.7 mg/dL — AB (ref 8.9–10.3)
CO2: 28 mmol/L (ref 22–32)
Chloride: 104 mmol/L (ref 98–111)
Creatinine, Ser: 0.65 mg/dL (ref 0.61–1.24)
GFR calc Af Amer: >60 mL/min (ref >60)
GFR calc non Af Amer: >60 mL/min (ref >60)
GLUCOSE: 110 mg/dL — AB (ref 70–99)
Potassium: 3.7 mmol/L (ref 3.5–5.1)
Sodium: 140 mmol/L (ref 135–145)
TOTAL PROTEIN: 5.9 g/dL — AB (ref 6.5–8.1)

## 2017-11-05 MED ORDER — FAMOTIDINE 20 MG PO TABS
20.0000 mg | ORAL_TABLET | Freq: Two times a day (BID) | ORAL | Status: DC
  Administered 2017-11-05 – 2017-11-08 (×6): 20 mg via ORAL
  Filled 2017-11-05 (×6): qty 1

## 2017-11-05 MED ORDER — KETOROLAC TROMETHAMINE 30 MG/ML IJ SOLN
30.0000 mg | Freq: Three times a day (TID) | INTRAMUSCULAR | Status: AC
  Administered 2017-11-05 – 2017-11-07 (×8): 30 mg via INTRAVENOUS
  Filled 2017-11-05 (×9): qty 1

## 2017-11-05 NOTE — Progress Notes (Signed)
3 Days Post-Op   Subjective/Chief Complaint: Still complains of crampy type pain. Tolerating diet   Objective: Vital signs in last 24 hours: Temp:  [97.7 F (36.5 C)-98.7 F (37.1 C)] 97.7 F (36.5 C) (07/21 0535) Pulse Rate:  [68-81] 68 (07/21 0535) Resp:  [15-20] 15 (07/21 0535) BP: (110-122)/(80-87) 110/81 (07/21 0535) SpO2:  [99 %-100 %] 100 % (07/21 0535) Last BM Date: 10/31/17  Intake/Output from previous day: 07/20 0701 - 07/21 0700 In: 1607.5 [P.O.:240; I.V.:1317.5; IV Piggyback:50] Out: 5631 [Urine:3650; Drains:200] Intake/Output this shift: No intake/output data recorded.  General appearance: alert and cooperative Resp: clear to auscultation bilaterally Cardio: regular rate and rhythm GI: soft, moderate tenderness. drain output serosang. incision looks good  Lab Results:  Recent Labs    11/04/17 0439 11/05/17 0505  WBC 6.2 4.9  HGB 11.1* 11.3*  HCT 34.6* 34.6*  PLT 208 200   BMET Recent Labs    11/04/17 0439 11/05/17 0505  NA 142 140  K 3.5 3.7  CL 107 104  CO2 30 28  GLUCOSE 129* 110*  BUN <5* <5*  CREATININE 0.58* 0.65  CALCIUM 8.2* 8.7*   PT/INR Recent Labs    11/03/17 0506  LABPROT 15.3*  INR 1.22   ABG No results for input(s): PHART, HCO3 in the last 72 hours.  Invalid input(s): PCO2, PO2  Studies/Results: No results found.  Anti-infectives: Anti-infectives (From admission, onward)   Start     Dose/Rate Route Frequency Ordered Stop   11/02/17 1630  ceFAZolin (ANCEF) IVPB 2g/100 mL premix     2 g 200 mL/hr over 30 Minutes Intravenous Every 8 hours 11/02/17 1244 11/02/17 1830   11/02/17 0600  ceFAZolin (ANCEF) IVPB 2g/100 mL premix     2 g 200 mL/hr over 30 Minutes Intravenous On call to O.R. 11/02/17 0543 11/02/17 0830      Assessment/Plan: s/p Procedure(s): LAPAROSCOPIC ASSISTED PARTIAL HEPATECTOMY (N/A) Advance diet  Hg stable. Continue drain Will add toradol for pain control and encourage use of muscle  relaxer ambulate  LOS: 3 days    TOTH III,Daniella Dewberry S 11/05/2017

## 2017-11-05 NOTE — Progress Notes (Signed)
PHARMACIST - PHYSICIAN COMMUNICATION  DR:   Marlou Starks  CONCERNING: IV to Oral Route Change Policy  RECOMMENDATION: This patient is receiving famotidine by the intravenous route.  Based on criteria approved by the Pharmacy and Therapeutics Committee, the intravenous medication(s) is/are being converted to the equivalent oral dose form(s).   DESCRIPTION: These criteria include:  The patient is eating (either orally or via tube) and/or has been taking other orally administered medications for a least 24 hours  The patient has no evidence of active gastrointestinal bleeding or impaired GI absorption (gastrectomy, short bowel, patient on TNA or NPO).  If you have questions about this conversion, please contact the Pharmacy Department  []   [phone] )  [facility] []   [phone] )  [facility] []   [phone] )  [facility] []   [phone] )  [facility] [x]   [phone] )  [place], PharmD, BCPS Pager: [phone] 11/05/2017 9:48 AM

## 2017-11-06 LAB — TYPE AND SCREEN
ABO/RH(D): O POS
ANTIBODY SCREEN: NEGATIVE
UNIT DIVISION: 0
UNIT DIVISION: 0
UNIT DIVISION: 0
Unit division: 0

## 2017-11-06 LAB — COMPREHENSIVE METABOLIC PANEL
ALT: 57 U/L — ABNORMAL HIGH (ref 0–44)
AST: 33 U/L (ref 15–41)
Albumin: 3.2 g/dL — ABNORMAL LOW (ref 3.5–5.0)
Alkaline Phosphatase: 51 U/L (ref 38–126)
Anion gap: 6 (ref 5–15)
BUN: 5 mg/dL — ABNORMAL LOW (ref 6–20)
CHLORIDE: 102 mmol/L (ref 98–111)
CO2: 32 mmol/L (ref 22–32)
Calcium: 8.7 mg/dL — ABNORMAL LOW (ref 8.9–10.3)
Creatinine, Ser: 0.77 mg/dL (ref 0.61–1.24)
GFR calc non Af Amer: >60 mL/min (ref >60)
Glucose, Bld: 120 mg/dL — ABNORMAL HIGH (ref 70–99)
Potassium: 4.3 mmol/L (ref 3.5–5.1)
Sodium: 140 mmol/L (ref 135–145)
Total Bilirubin: 0.8 mg/dL (ref 0.3–1.2)
Total Protein: 6 g/dL — ABNORMAL LOW (ref 6.5–8.1)

## 2017-11-06 LAB — BPAM RBC
BLOOD PRODUCT EXPIRATION DATE: 201908152359
BLOOD PRODUCT EXPIRATION DATE: 201908152359
Blood Product Expiration Date: 201908152359
Blood Product Expiration Date: 201908162359
ISSUE DATE / TIME: 201907180832
ISSUE DATE / TIME: 201907180832
UNIT TYPE AND RH: 5100
Unit Type and Rh: 5100
Unit Type and Rh: 5100
Unit Type and Rh: 5100

## 2017-11-06 LAB — CBC
HCT: 36.2 % — ABNORMAL LOW (ref 39.0–52.0)
Hemoglobin: 11.8 g/dL — ABNORMAL LOW (ref 13.0–17.0)
MCH: 29.6 pg (ref 26.0–34.0)
MCHC: 32.6 g/dL (ref 30.0–36.0)
MCV: 90.7 fL (ref 78.0–100.0)
Platelets: 235 10*3/uL (ref 150–400)
RBC: 3.99 MIL/uL — ABNORMAL LOW (ref 4.22–5.81)
RDW: 15.9 % — ABNORMAL HIGH (ref 11.5–15.5)
WBC: 5 10*3/uL (ref 4.0–10.5)

## 2017-11-06 MED ORDER — OXYCODONE HCL 5 MG PO TABS
5.0000 mg | ORAL_TABLET | ORAL | Status: DC | PRN
  Administered 2017-11-06 – 2017-11-08 (×10): 10 mg via ORAL
  Filled 2017-11-06 (×11): qty 2

## 2017-11-06 MED ORDER — METHOCARBAMOL 500 MG PO TABS
500.0000 mg | ORAL_TABLET | Freq: Four times a day (QID) | ORAL | Status: DC | PRN
  Administered 2017-11-06 – 2017-11-08 (×3): 500 mg via ORAL
  Filled 2017-11-06 (×3): qty 1

## 2017-11-06 NOTE — Progress Notes (Signed)
4 Days Post-Op   Subjective/Chief Complaint: Improving pain control with toradol.  Still needing dilaudid breakthrough IV.     Objective: Vital signs in last 24 hours: Temp:  [98 F (36.7 C)-98.7 F (37.1 C)] 98.7 F (37.1 C) (07/22 0513) Pulse Rate:  [64-71] 64 (07/22 0513) Resp:  [16-18] 18 (07/22 0513) BP: (102-115)/(74-76) 115/76 (07/22 0513) SpO2:  [98 %-100 %] 98 % (07/22 0513) Last BM Date: 11/06/17  Intake/Output from previous day: 07/21 0701 - 07/22 0700 In: 2785.5 [P.O.:950; I.V.:1735.5; IV Piggyback:100] Out: 1089 [Urine:1050; Drains:39] Intake/Output this shift: No intake/output data recorded.  General appearance: alert and cooperative Resp: breathing comfortably GI: soft, moderate tenderness. drain output serosang. Dressing c/d/i. Ext: wwp, no c/c/e  Lab Results:  Recent Labs    11/05/17 0505 11/06/17 0416  WBC 4.9 5.0  HGB 11.3* 11.8*  HCT 34.6* 36.2*  PLT 200 235   BMET Recent Labs    11/05/17 0505 11/06/17 0416  NA 140 140  K 3.7 4.3  CL 104 102  CO2 28 32  GLUCOSE 110* 120*  BUN <5* 5*  CREATININE 0.65 0.77  CALCIUM 8.7* 8.7*   PT/INR No results for input(s): LABPROT, INR in the last 72 hours. ABG No results for input(s): PHART, HCO3 in the last 72 hours.  Invalid input(s): PCO2, PO2  Studies/Results: No results found.  Anti-infectives: Anti-infectives (From admission, onward)   Start     Dose/Rate Route Frequency Ordered Stop   11/02/17 1630  ceFAZolin (ANCEF) IVPB 2g/100 mL premix     2 g 200 mL/hr over 30 Minutes Intravenous Every 8 hours 11/02/17 1244 11/02/17 1830   11/02/17 0600  ceFAZolin (ANCEF) IVPB 2g/100 mL premix     2 g 200 mL/hr over 30 Minutes Intravenous On call to O.R. 11/02/17 0543 11/02/17 0830      Assessment/Plan: s/p Procedure(s): LAPAROSCOPIC ASSISTED PARTIAL HEPATECTOMY (N/A) Advance diet  D/c JP KVO ivf. Increase frequency of oxy and muscle relaxant.   Hope to d/c tomorrow if pain control  OK.   LOS: 4 days    Jacob Galvan 11/06/2017

## 2017-11-06 NOTE — Plan of Care (Signed)
Plan of care discussed with patient 

## 2017-11-07 MED ORDER — MAGNESIUM CITRATE PO SOLN
0.5000 | Freq: Once | ORAL | Status: AC
  Administered 2017-11-07: 0.5 via ORAL
  Filled 2017-11-07: qty 296

## 2017-11-07 MED ORDER — PSYLLIUM 95 % PO PACK
1.0000 | PACK | Freq: Three times a day (TID) | ORAL | Status: DC
  Filled 2017-11-07: qty 1

## 2017-11-07 NOTE — Plan of Care (Signed)
Patient is able to ambulate several laps at a time. He is eating well and is OOB most of day.

## 2017-11-07 NOTE — Progress Notes (Signed)
5 Days Post-Op   Subjective/Chief Complaint: No BM for several days.  Still requiring IV pain meds.  Drain removed yesterday.     Objective: Vital signs in last 24 hours: Temp:  [98.6 F (37 C)-98.8 F (37.1 C)] 98.6 F (37 C) (07/23 0624) Pulse Rate:  [68-71] 71 (07/23 0624) Resp:  [14-18] 18 (07/23 0624) BP: (114-132)/(82-89) 114/89 (07/23 0624) SpO2:  [99 %-100 %] 100 % (07/23 0624) Last BM Date: 11/06/17  Intake/Output from previous day: 07/22 0701 - 07/23 0700 In: 1410 [P.O.:1410] Out: 2950 [Urine:2950] Intake/Output this shift: No intake/output data recorded.  General appearance: alert and cooperative Resp: breathing comfortably GI: soft, non distended.  Dressing c/d/i. Most tenderness at pfannenstiel incision.   Ext: wwp, no c/c/e  Lab Results:  Recent Labs    11/05/17 0505 11/06/17 0416  WBC 4.9 5.0  HGB 11.3* 11.8*  HCT 34.6* 36.2*  PLT 200 235   BMET Recent Labs    11/05/17 0505 11/06/17 0416  NA 140 140  K 3.7 4.3  CL 104 102  CO2 28 32  GLUCOSE 110* 120*  BUN <5* 5*  CREATININE 0.65 0.77  CALCIUM 8.7* 8.7*   PT/INR No results for input(s): LABPROT, INR in the last 72 hours. ABG No results for input(s): PHART, HCO3 in the last 72 hours.  Invalid input(s): PCO2, PO2  Studies/Results: No results found.  Anti-infectives: Anti-infectives (From admission, onward)   Start     Dose/Rate Route Frequency Ordered Stop   11/02/17 1630  ceFAZolin (ANCEF) IVPB 2g/100 mL premix     2 g 200 mL/hr over 30 Minutes Intravenous Every 8 hours 11/02/17 1244 11/02/17 1830   11/02/17 0600  ceFAZolin (ANCEF) IVPB 2g/100 mL premix     2 g 200 mL/hr over 30 Minutes Intravenous On call to O.R. 11/02/17 0543 11/02/17 0830      Assessment/Plan: s/p Procedure(s): LAPAROSCOPIC ASSISTED PARTIAL HEPATECTOMY (N/A) Diet as tolerated.   Increase po fiber intake and add mag citrate.   KVO ivf.  oxy and muscle relaxant, toradol, neurontin.   Hope to d/c  tomorrow if pain control OK and has BM.   LOS: 5 days    Stark Klein 11/07/2017

## 2017-11-08 MED ORDER — METHOCARBAMOL 500 MG PO TABS: 1000.0000 mg | ORAL_TABLET | Freq: Four times a day (QID) | ORAL | 1 refills | Status: AC | PRN

## 2017-11-08 MED ORDER — SENNA 8.6 MG PO TABS: 1.0000 | ORAL_TABLET | Freq: Two times a day (BID) | ORAL | 0 refills | Status: AC

## 2017-11-08 MED ORDER — PSYLLIUM 95 % PO PACK: 1.0000 | PACK | Freq: Three times a day (TID) | ORAL | 3 refills | Status: AC

## 2017-11-08 MED ORDER — ACETAMINOPHEN 500 MG PO TABS: 1000.0000 mg | ORAL_TABLET | Freq: Four times a day (QID) | ORAL | 0 refills | Status: AC

## 2017-11-08 MED ORDER — OXYCODONE HCL 5 MG PO TABS: 5.0000 mg | ORAL_TABLET | ORAL | 0 refills | Status: AC | PRN

## 2017-11-08 MED ORDER — BISACODYL 5 MG PO TBEC: 5.0000 mg | DELAYED_RELEASE_TABLET | Freq: Every day | ORAL | 0 refills | Status: AC | PRN

## 2017-11-08 MED FILL — oxyCODONE HCL 5 MG TABS: 5 | 5 days supply | Qty: 60 | Fill #0

## 2017-11-08 NOTE — Discharge Instructions (Signed)
[facility]      [facility] °[phone] ° °ABDOMINAL SURGERY: POST OP INSTRUCTIONS ° °Always review your discharge instruction sheet given to you by the facility where your surgery was performed. ° °IF YOU HAVE DISABILITY OR FAMILY LEAVE FORMS, YOU MUST BRING THEM TO THE OFFICE FOR PROCESSING.  PLEASE DO NOT GIVE THEM TO YOUR DOCTOR. ° °1. A prescription for pain medication may be given to you upon discharge.  Take your pain medication as prescribed, if needed.  If narcotic pain medicine is not needed, then you may take acetaminophen (Tylenol) or ibuprofen (Advil) as needed. °2. Take your usually prescribed medications unless otherwise directed. °3. If you need a refill on your pain medication, please contact your pharmacy. They will contact our office to request authorization.  Prescriptions will not be filled after 5pm or on week-ends. °4. You should follow a light diet the first few days after arrival home, such as soup and crackers, pudding, etc.unless your doctor has advised otherwise. A high-fiber, low fat diet can be resumed as tolerated.   Be sure to include lots of fluids daily. Most patients will experience some swelling and bruising on the chest and neck area.  Ice packs will help.  Swelling and bruising can take several days to resolve °5. Most patients will experience some swelling and bruising in the area of the incision. Ice pack will help. Swelling and bruising can take several days to resolve..  °6. It is common to experience some constipation if taking pain medication after surgery.  Increasing fluid intake and taking a stool softener will usually help or prevent this problem from occurring.  A mild laxative (Milk of Magnesia or Miralax) should be taken according to package directions if there are no bowel movements after 48 hours. °7.  You may have steri-strips (small skin tapes) in place directly over the incision.  These strips should be left on the skin for 10-14 days.  If your  surgeon used skin glue on the incision, you may shower in 48 hours.  The glue will flake off over the next 2-3 weeks.  Any sutures or staples will be removed at the office during your follow-up visit. You may find that a light gauze bandage over your incision may keep your staples from being rubbed or pulled. You may shower and replace the bandage daily. °8. ACTIVITIES:  You may resume regular (light) daily activities beginning the next day--such as daily self-care, walking, climbing stairs--gradually increasing activities as tolerated.  You may have sexual intercourse when it is comfortable.  Refrain from any heavy lifting or straining until approved by your doctor. °a. You may drive when you no longer are taking prescription pain medication, you can comfortably wear a seatbelt, and you can safely maneuver your car and apply brakes °b. Return to Work: __________8 weeks if applicable_________________________ °9. You should see your doctor in the office for a follow-up appointment approximately two weeks after your surgery.  Make sure that you call for this appointment within a day or two after you arrive home to insure a convenient appointment time. °OTHER INSTRUCTIONS:  °_____________________________________________________________ °_____________________________________________________________ ° °WHEN TO CALL YOUR DOCTOR: °1. Fever over 101.0 °2. Inability to urinate °3. Nausea and/or vomiting °4. Extreme swelling or bruising °5. Continued bleeding from incision. °6. Increased pain, redness, or drainage from the incision. °7. Difficulty swallowing or breathing °8. Muscle cramping or spasms. °9. Numbness or tingling in hands or feet or around lips. ° °The clinic staff is   available to answer your questions during regular business hours.  Please don’t hesitate to call and ask to speak to one of the nurses if you have concerns. ° °For further questions, please visit [email or web address] ° ° ° °

## 2017-11-08 NOTE — Progress Notes (Addendum)
Discharge instructions given to pt and all questions were answered. Pt taken out via wheelchair and picked up in the lobby by his mother.

## 2017-11-08 NOTE — Discharge Summary (Signed)
Physician Discharge Summary  Patient ID: Jacob Galvan. MRN: [id number] DOB/AGE: [birth year] 42 y.o.  Admit date: 11/02/2017 Discharge date: 11/08/2017  Admission Diagnoses: Colon cancer metastasized to liver   Discharge Diagnoses:  Active Problems:   Colon cancer metastasized to liver [facility])   Discharged Condition: stable  Hospital Course: Pt was admitted to the floor following lap assisted left lateral segmentectomy (partial hepatectomy) 11/02/2017.  He had issues with pain control, which is not unexpected.  He required switch to IV dilaudid from morphine.  He used prn robaxin and standing tylenol and neurontin. At first, he was on toradol as well.  He was able to convert to all PO meds with oxycodone 10 mg q 4 hours as needed.  His main complaints of pain were at his pfannenstiel incision.    He did well with ambulating and incentive spirometry.  He was able to tolerate diet advance relatively rapidly.  He had issues with his stools, having diarrhea first, then constipation.  Fiber was added as well as stool softeners.  He required magnesium citrate to have BM.    Consults: None  Significant Diagnostic Studies: labs: LFTs trended down and were near normal prior to d/c.  AST/ALT still sl elevated.  HCT prior to d/c 36.2.  Treatments: surgery: see above  Discharge Exam: Blood pressure 109/89, pulse 71, temperature 98.3 F (36.8 C), temperature source Oral, resp. rate 18, height 6\' 2"  (1.88 m), weight 68.9 kg (152 lb), SpO2 (!) 84 %. General appearance: alert, cooperative and no distress Resp: breathing comfortably GI: soft, non distended, approp tender at incisions.  no erythema or drainage from any incisions.   Extremities: extremities normal, atraumatic, no cyanosis or edema  Disposition: Discharge disposition: 01-Home or Self Care       Discharge Instructions    Call MD for:  difficulty breathing, headache or visual disturbances   Complete by:  As directed    Call MD for:   persistant nausea and vomiting   Complete by:  As directed    Call MD for:  redness, tenderness, or signs of infection (pain, swelling, redness, odor or green/yellow discharge around incision site)   Complete by:  As directed    Call MD for:  severe uncontrolled pain   Complete by:  As directed    Call MD for:  temperature >100.4   Complete by:  As directed    Diet - low sodium heart healthy   Complete by:  As directed    Increase activity slowly   Complete by:  As directed    No wound care   Complete by:  As directed      Allergies as of 11/08/2017   No Known Allergies     Medication List    TAKE these medications   acetaminophen 500 MG tablet Commonly known as:  TYLENOL Take 2 tablets (1,000 mg total) by mouth every 6 (six) hours. What changed:    when to take this  reasons to take this   ASPERCREME LIDOCAINE EX Apply 1 application topically daily as needed (pain).   barrier cream Crea Commonly known as:  non-specified Apply 1 application topically 2 (two) times daily as needed (irritation).   bisacodyl 5 MG EC tablet Commonly known as:  DULCOLAX Take 1 tablet (5 mg total) by mouth daily as needed for moderate constipation.   gabapentin 300 MG capsule Commonly known as:  NEURONTIN Take 1 capsule (300 mg total) by mouth 3 (three) times daily. What changed:  when to take this  reasons to take this   methocarbamol 500 MG tablet Commonly known as:  ROBAXIN Take 2 tablets (1,000 mg total) by mouth every 6 (six) hours as needed for muscle spasms. What changed:    how much to take  when to take this   oxyCODONE 5 MG immediate release tablet Commonly known as:  Oxy IR/ROXICODONE Take 1-2 tablets (5-10 mg total) by mouth every 4 (four) hours as needed for moderate pain, severe pain or breakthrough pain. What changed:  when to take this   psyllium 95 % Pack Commonly known as:  HYDROCIL/METAMUCIL Take 1 packet by mouth 3 (three) times daily.   senna 8.6  MG Tabs tablet Commonly known as:  SENOKOT Take 1 tablet (8.6 mg total) by mouth 2 (two) times daily.      Follow-up Information    Stark Klein, MD Follow up in 2 week(s).   Specialty:  General Surgery Contact information: [address] [place] [address] [phone]           Signed: Stark Klein 11/08/2017, 4:56 PM

## 2017-11-27 ENCOUNTER — Ambulatory Visit: Payer: Medicaid Other | Admitting: Family Medicine

## 2018-06-01 IMAGING — CT CT ABD-PELV W/ CM
3 of 10 series · 12 of 46 positions shown, 17 images · IV contrast (omnipaque)
Comparison: CT scan of August 31, 2017.

CLINICAL DATA: Acute generalized abdominal pain. History of rectal
cancer.

EXAM:
CT ANGIOGRAPHY CHEST
CT ABDOMEN AND PELVIS WITH CONTRAST
TECHNIQUE: Multidetector CT imaging of the chest was performed using the
standard protocol during bolus administration of intravenous
contrast. Multiplanar CT image reconstructions and MIPs were
obtained to evaluate the vascular anatomy. Multidetector CT imaging
of the abdomen and pelvis was performed using the standard protocol
during bolus administration of intravenous contrast.
CONTRAST:  30mL OMNIPAQUE IOHEXOL 300 MG/ML SOLN, 80mL [id number]
IOPAMIDOL ([id number]) INJECTION 76%

[Series 4: axial st · axial · 0.83mm/px · z∈[+1182,+1422]mm · 3 of 98 slices shown, 7 images]
[im 25/98  soft-tissue]
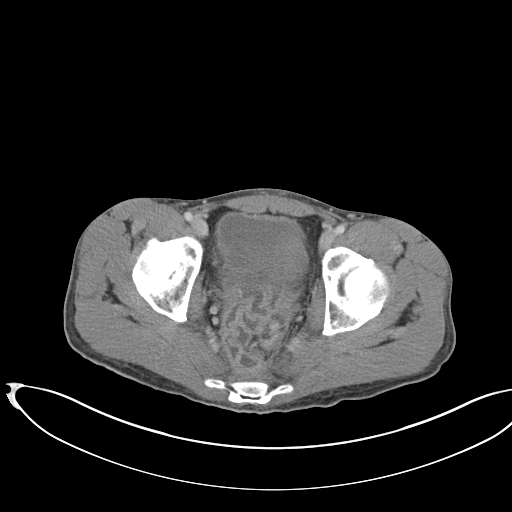
[im 25/98  lung]
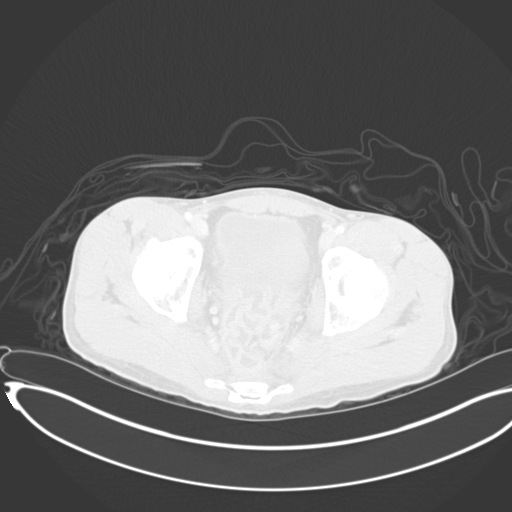
[im 25/98  bone]
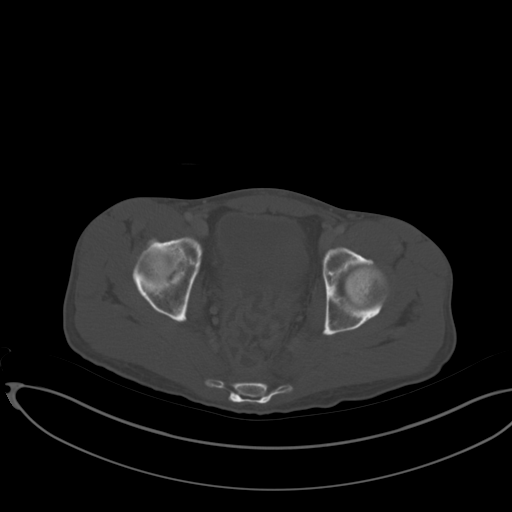
[im 49/98  soft-tissue]
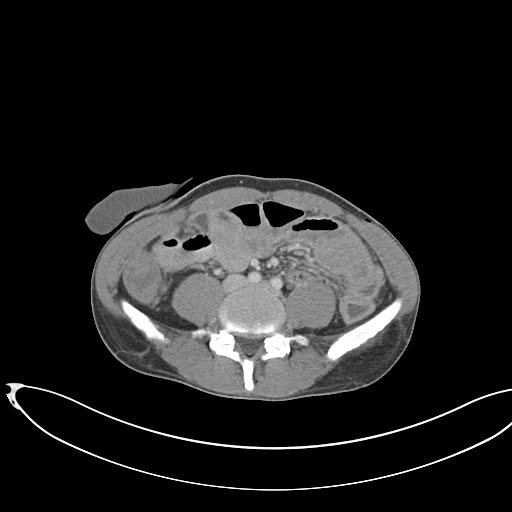
[im 49/98  lung]
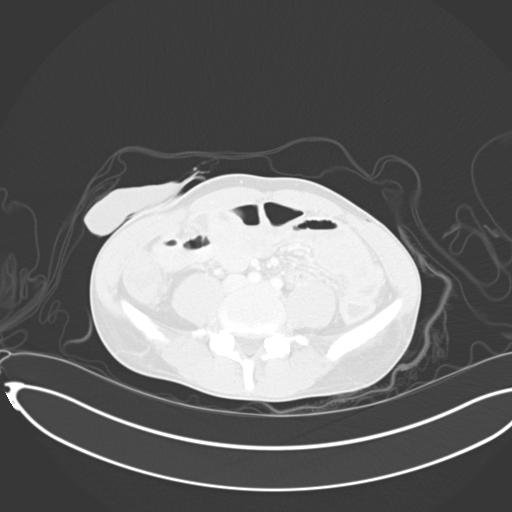
[im 73/98  soft-tissue]
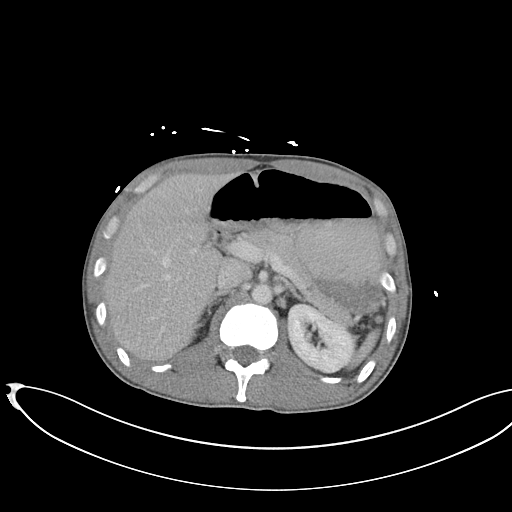
[im 73/98  lung]
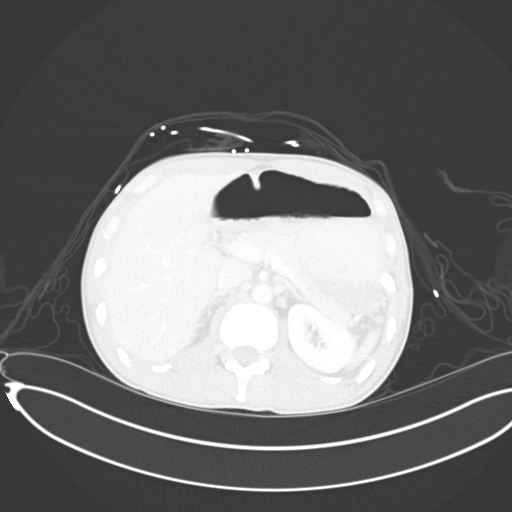

[Series 7: thins · axial · 0.72mm/px · z∈[+1478,+1739]mm · 8 of 299 slices shown]
[im 19/299  soft-tissue]
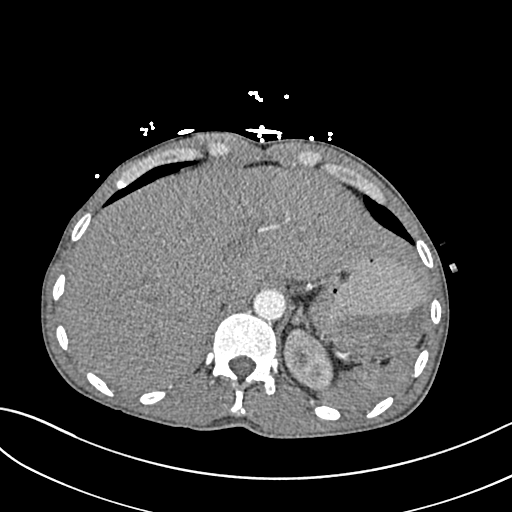
[im 56/299  soft-tissue]
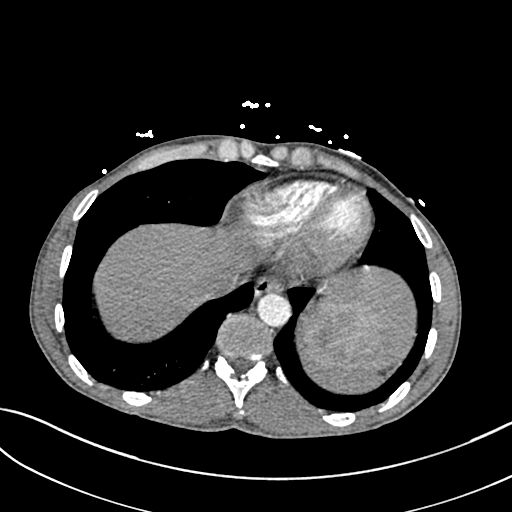
[im 94/299  soft-tissue]
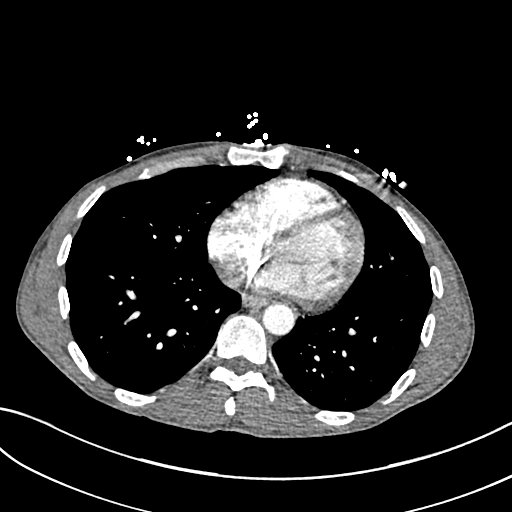
[im 131/299  soft-tissue]
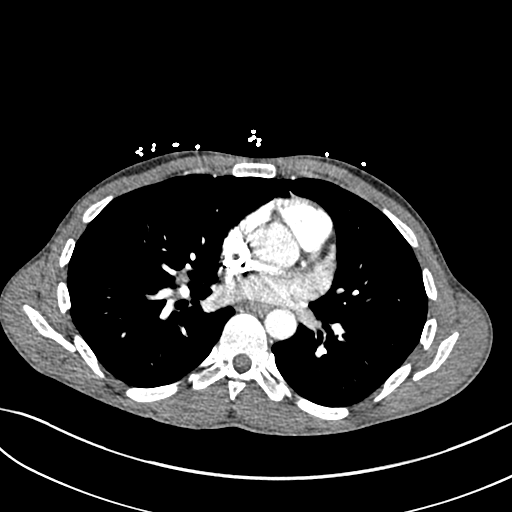
[im 168/299  soft-tissue]
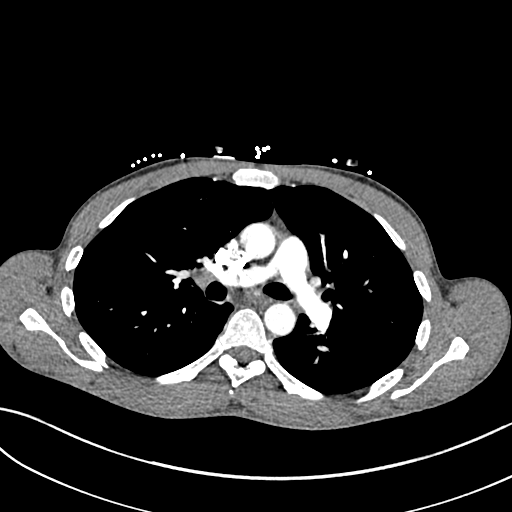
[im 205/299  soft-tissue]
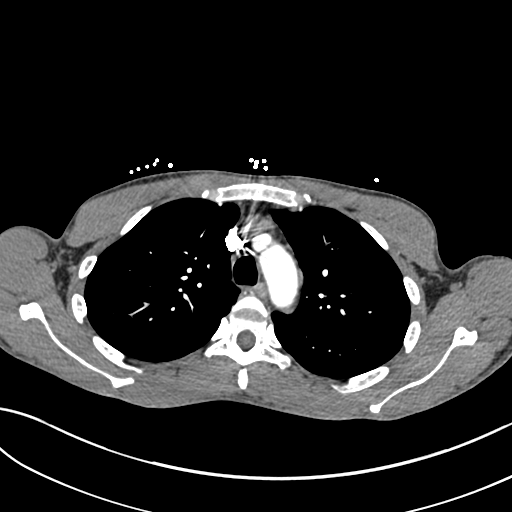
[im 243/299  soft-tissue]
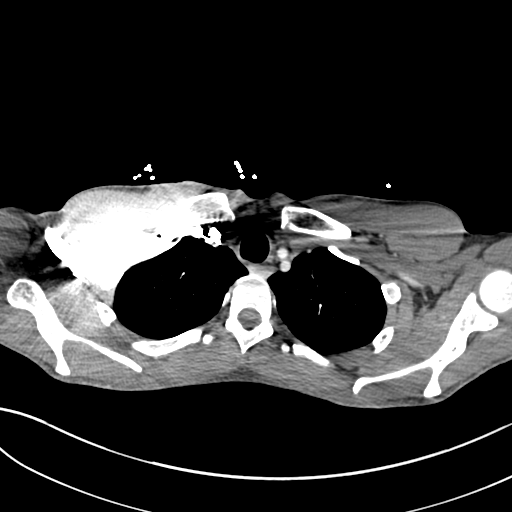
[im 280/299  soft-tissue]
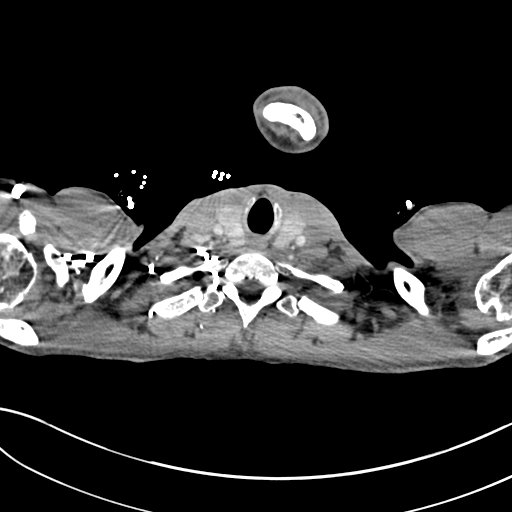

[Series 8: coronal mpr · coronal · 0.61mm/px · 1 of 112 slices shown, 2 images]
[im 56/112  soft-tissue]
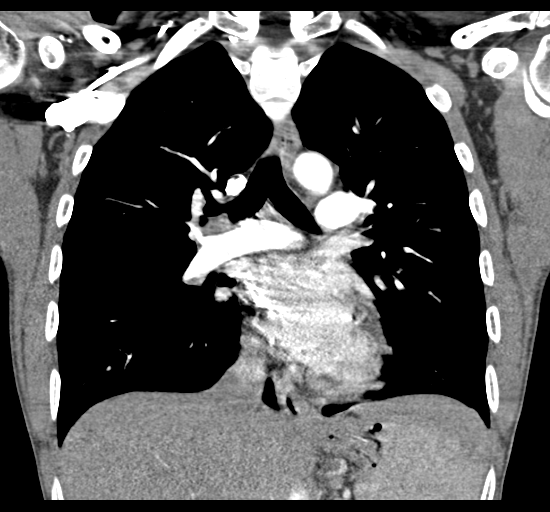
[im 56/112  bone]
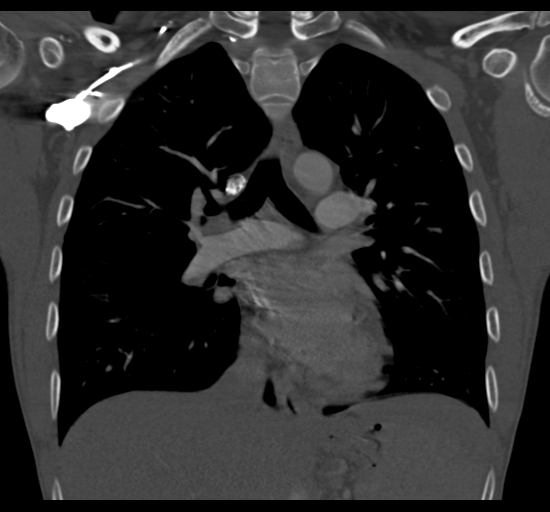

[12 of 46 positions shown; findings below may reference images not displayed]

FINDINGS: CTA CHEST FINDINGS

Cardiovascular: Satisfactory opacification of the pulmonary arteries
to the segmental level. No evidence of pulmonary embolism. Normal
heart size. No pericardial effusion.

Mediastinum/Nodes: No enlarged mediastinal, hilar, or axillary lymph
nodes. Thyroid gland, trachea, and esophagus demonstrate no
significant findings.

Lungs/Pleura: Lungs are clear. No pleural effusion or pneumothorax.

Musculoskeletal: No chest wall abnormality. No acute or significant
osseous findings.

Review of the MIP images confirms the above findings.

CT ABDOMEN and PELVIS FINDINGS

Hepatobiliary: No gallstones are noted. Stable left hepatic low
density. No biliary dilatation is noted.

Pancreas: Unremarkable. No pancreatic ductal dilatation or
surrounding inflammatory changes.

Spleen: Normal in size without focal abnormality.

Adrenals/Urinary Tract: Adrenal glands are unremarkable. Kidneys are
normal, without renal calculi, focal lesion, or hydronephrosis.
Bladder is unremarkable.

Stomach/Bowel: Stomach appears normal. The appendix appears normal.
Colostomy is noted in right lower quadrant. Postsurgical changes are
noted in the rectal region. No definite bowel dilatation is noted.

Vascular/Lymphatic: No significant vascular findings are present. No
enlarged abdominal or pelvic lymph nodes.

Reproductive: Prostate is not well visualized.

Other: No abdominal wall hernia or abnormality. No abdominopelvic
ascites.

Musculoskeletal: No acute or significant osseous findings.

Review of the MIP images confirms the above findings.
IMPRESSION: No definite evidence of pulmonary embolus.

Postsurgical changes are noted in the rectal region. Colostomy is
noted in right lower quadrant. No abnormal bowel dilatation is
noted.

## 2018-08-31 ENCOUNTER — Encounter: Payer: Self-pay | Admitting: Genetic Counselor

## 2018-08-31 NOTE — Progress Notes (Signed)
UPDATE: ATM c.[id number] (p.[id number]), and ATM c.4871A>G (p.[id number]) variants of uncertain significance were reclassified as Likely Benign as a result of re-review of the evidence in light of new variant interpretation guidelines and/or new information.  The updated report date is Aug 29, 2018.
